# Patient Record
Sex: Female | Born: 1935 | Race: White | Hispanic: No | Smoking: Never smoker
Health system: Southern US, Community
[De-identification: ages and names within clinical notes are randomized; demographics above are authoritative.]

## PROBLEM LIST (undated history)

## (undated) DIAGNOSIS — I350 Nonrheumatic aortic (valve) stenosis: Secondary | ICD-10-CM

## (undated) DIAGNOSIS — D352 Benign neoplasm of pituitary gland: Secondary | ICD-10-CM

## (undated) DIAGNOSIS — J690 Pneumonitis due to inhalation of food and vomit: Secondary | ICD-10-CM

## (undated) DIAGNOSIS — I10 Essential (primary) hypertension: Secondary | ICD-10-CM

## (undated) DIAGNOSIS — R1310 Dysphagia, unspecified: Secondary | ICD-10-CM

## (undated) DIAGNOSIS — S329XXA Fracture of unspecified parts of lumbosacral spine and pelvis, initial encounter for closed fracture: Secondary | ICD-10-CM

## (undated) DIAGNOSIS — I712 Thoracic aortic aneurysm, without rupture, unspecified: Secondary | ICD-10-CM

## (undated) DIAGNOSIS — I96 Gangrene, not elsewhere classified: Secondary | ICD-10-CM

## (undated) DIAGNOSIS — B019 Varicella without complication: Secondary | ICD-10-CM

## (undated) DIAGNOSIS — E785 Hyperlipidemia, unspecified: Secondary | ICD-10-CM

## (undated) DIAGNOSIS — G4700 Insomnia, unspecified: Secondary | ICD-10-CM

## (undated) DIAGNOSIS — I639 Cerebral infarction, unspecified: Secondary | ICD-10-CM

## (undated) DIAGNOSIS — R011 Cardiac murmur, unspecified: Secondary | ICD-10-CM

## (undated) DIAGNOSIS — G8191 Hemiplegia, unspecified affecting right dominant side: Secondary | ICD-10-CM

## (undated) DIAGNOSIS — I872 Venous insufficiency (chronic) (peripheral): Secondary | ICD-10-CM

## (undated) DIAGNOSIS — M1990 Unspecified osteoarthritis, unspecified site: Secondary | ICD-10-CM

## (undated) HISTORY — DX: Gangrene, not elsewhere classified: I96

## (undated) HISTORY — PX: ABDOMINAL HYSTERECTOMY: SHX81

## (undated) HISTORY — PX: ABOVE KNEE LEG AMPUTATION: SUR20

## (undated) HISTORY — DX: Nonrheumatic aortic (valve) stenosis: I35.0

## (undated) HISTORY — DX: Varicella without complication: B01.9

---

## 2003-10-25 ENCOUNTER — Inpatient Hospital Stay (HOSPITAL_COMMUNITY): Admission: RE | Admit: 2003-10-25 | Discharge: 2003-10-26 | Payer: Self-pay | Admitting: *Deleted

## 2003-10-25 ENCOUNTER — Encounter (INDEPENDENT_AMBULATORY_CARE_PROVIDER_SITE_OTHER): Payer: Self-pay | Admitting: *Deleted

## 2006-08-29 ENCOUNTER — Encounter: Admission: RE | Admit: 2006-08-29 | Discharge: 2006-08-29 | Payer: Self-pay | Admitting: Sports Medicine

## 2006-09-12 ENCOUNTER — Emergency Department (HOSPITAL_COMMUNITY): Admission: EM | Admit: 2006-09-12 | Discharge: 2006-09-12 | Payer: Self-pay

## 2007-10-18 ENCOUNTER — Emergency Department (HOSPITAL_BASED_OUTPATIENT_CLINIC_OR_DEPARTMENT_OTHER): Admission: EM | Admit: 2007-10-18 | Discharge: 2007-10-18 | Payer: Self-pay | Admitting: Emergency Medicine

## 2010-06-15 NOTE — H&P (Signed)
NAME:  Christina Pittman, Christina Pittman                 ACCOUNT NO.:  [id number]   MEDICAL RECORD NO.:  [id number]          PATIENT TYPE:  INP   LOCATION:  NA                           FACILITY:  [facility]   PHYSICIAN:  Almedia Balls. Fore, M.D.   DATE OF BIRTH:  [birth year]   DATE OF ADMISSION:  10/25/2003  DATE OF DISCHARGE:                                HISTORY & PHYSICAL   CHIEF COMPLAINT:  Something falling.   HISTORY:  The patient is an 77 year old gravida 3, para 3, with last  menstrual period many years ago, who was seen in our office initially on  July 12, 2003, with total uterovaginal prolapse.  She was evaluated at that  time and felt that she would benefit from a pessary to replace the uterus  and vaginal tissue.  This was attempted, and the patient felt that this was  too uncomfortable.  Multiple sizes of pessaries were attempted, but the  patient declined to continue using them.  She was evaluated over the next  several months on approximately every two to three-week interval after  suggesting that she do Kegel exercises and use Crisco shortening as she did  not wish to use any hormone replacement.  This problem has not improved.  She is admitted at this time for hysterectomy, bilateral salpingo-  oophorectomy at her request, and repair of uterovaginal prolapse with  anterior and posterior colporrhaphy.  Pap smear recently has been normal.  The patient states she has never had an abnormal Pap smear.  She has been  fully counseled as to the nature of the surgery and the risks involved, to  include risks of anesthesia, injury to bowel, bladder, blood vessels,  ureters, postoperative hemorrhage, infection, recuperation, use of a  suprapubic catheter postoperatively.  She fully understands all these  considerations and wishes to proceed on October 25, 2003.   PAST MEDICAL HISTORY:  History of hypertension, for which she is followed by  doctors in the [facility].   FAMILY HISTORY:   Brother with heart disease and hypertension.   REVIEW OF SYSTEMS:  HEENT:  Negative.  CARDIORESPIRATORY:  Negative except  for hypertension.  GASTROINTESTINAL:  Negative.  GENITOURINARY:  As in  present illness.  NEUROMUSCULAR:  Negative.   PHYSICAL EXAMINATION:  VITAL SIGNS:  Height 5 feet 6 inches, weight 168  pounds, blood pressure 142/102, pulse 88, respirations 18.  GENERAL:  A well-developed white female in no acute distress but anxious.  HEENT:  Within normal limits.  NECK:  Supple without mass, adenopathy, or bruits.  CARDIAC:  Regular rate and rhythm without murmurs.  CHEST:  Lungs clear to P&A.  BREASTS:  Sitting and lying without mass, axillae negative.  ABDOMEN:  Flat and soft without mass or tenderness.  PELVIC:  External genitalia, Bartholin's, urethra, and Skene's glands within  normal limits.  There is a complete uterovaginal prolapse present with  cervix extending out 4-5 cm beyond the introitus at rest.  There are no  palpable adnexal masses.  Rectovaginal exam is confirmatory.  EXTREMITIES:  Within normal limits.  CENTRAL NERVOUS SYSTEM:  Grossly intact.  SKIN:  Without suspicious lesions.   IMPRESSION:  Uterovaginal prolapse, complete.   DISPOSITION:  Surgery as noted above.      SRF/MEDQ  D:  10/20/2003  T:  10/20/2003  Job:  [id number]

## 2010-06-15 NOTE — Discharge Summary (Signed)
NAME:  Christina Pittman, Christina Pittman NO.:  [id number]   MEDICAL RECORD NO.:  [id number]          PATIENT TYPE:  INP   LOCATION:  0469                         FACILITY:  [facility]   PHYSICIAN:  Almedia Balls. Fore, M.D.   DATE OF BIRTH:  08-22-1919   DATE OF ADMISSION:  10/25/2003  DATE OF DISCHARGE:  10/26/2003                                 DISCHARGE SUMMARY   HISTORY:  The patient is an 77 year old with complete uterovaginal prolapse  for hysterectomy and repairs on October 25, 2003. The remainder of her  history and physical are as previously dictated.   LABORATORY DATA:  Laboratory data includes preoperative hemoglobin 13.7.  Other chemistries include creatinine 0.9, BUN 11, and glucose elevated at  194. Chest x-ray was without acute process but with accentuated  bronchovascular markings. Electrocardiogram showed anterior infarct of  undetermined age but otherwise normal.   HOSPITAL COURSE:  The patient was taken to the operating room on October 25, 2003 at which time laparoscopy, vaginal hysterectomy, anterior and  posterior colporrhaphy, and colpocleisis were performed. The patient did  well postoperatively. Diet and ambulation were progressed over the evening  of September 27 and early morning of September 28. On the morning of  September 28, she was experiencing pain which was relieved with oral  analgesics, and the suprapubic catheter was functioning normally. She is to  be instructed in the use of the suprapubic catheter and the way to clamp and  release for her own voiding. It was felt that she could be discharged after  this was accomplished.   FINAL DIAGNOSES:  1.  Complete uterovaginal prolapse.  2.  Pelvic pain secondary to the above.   OPERATION:  Laparoscopy, vaginal hysterectomy, anterior and posterior  colporrhaphy, colpocleisis. Pathology report unavailable at the time of  dictation.   DISPOSITION:  Discharged home to return to the office in 2 weeks for  follow  up or when she is able to void and have measured residuals less than 60 mL  twice in a 12-hour period. She was instructed to gradually progress her  activities over several weeks at home and to limit lifting and driving for 2  weeks. She was fully ambulatory, on a regular diet, and in good condition at  the time of discharge. She was given prescriptions for Darvocet-N 100  generic #30 to be taken 1/2 to 2 q.6h. p.r.n. pain, doxycycline 100 mg #12  to be taken 1 b.i.d., and Urecholine generic 25 mg #28 one q.i.d.      SRF/MEDQ  D:  10/26/2003  T:  10/26/2003  Job:  [id number]

## 2010-06-15 NOTE — Op Note (Signed)
NAME:  Christina Pittman, Christina Pittman NO.:  [id number]   MEDICAL RECORD NO.:  [id number]          PATIENT TYPE:  INP   LOCATION:  0469                         FACILITY:  [facility]   PHYSICIAN:  Almedia Balls. Fore, M.D.   DATE OF BIRTH:  1919-11-30   DATE OF PROCEDURE:  10/25/2003  DATE OF DISCHARGE:                                 OPERATIVE REPORT   PREOPERATIVE DIAGNOSIS:  Complete uterovaginal prolapse.   POSTOPERATIVE DIAGNOSIS:  Complete uterovaginal prolapse, pending pathology.   OPERATION:  Laparoscopy, vaginal hysterectomy, anterior and posterior  colporrhaphy, colpocleisis.   ANESTHESIA:  General orotracheal.   OPERATOR:  Almedia Balls. Randell Patient, M.D.   FIRST ASSISTANT:  Leona Singleton, M.D.   INDICATIONS FOR SURGERY:  Patient is an 77 year old with the above-noted  problems who has been tried on various other temporizing methods to reduce  the uterovaginal prolapse without success.  She has been counseled as to the  need for surgery and the type of surgery to be performed as well as the  risks involved, to include risk of anesthesia, injury to bowel, bladder,  blood vessels, ureters, postoperative hemorrhage, infection, recuperation,  use of a suprapubic catheter postoperatively and total closure of the  vaginal area.  She fully understands all of these considerations and wishes  to proceed on October 25, 2003.   OPERATIVE FINDINGS:  On examination under anesthesia, it was noted that the  uterus and upper vagina were totally prolapsed through the introitus.  There  was a severe cystocele and rectocele present as well.  On laparoscopy, the  lower liver edge area of the gallbladder, spleen, and area of the appendix  were normal on visualization.  The ovaries were quite atrophic and just  simply streaks in the area of the adnexal structures.   PROCEDURE:  With the patient under general anesthesia, prepped and draped in  the usual sterile fashion, and with SCD stockings on, the  patient was  examined in the dorsal lithotomy position.  A Foley catheter was placed in  the bladder with 400 ml of normal saline being instilled.  Placement of a  Bonnano suprapubic catheter was accomplished without difficulty.  This was  left to straight drainage.  The patient was then prepared and draped for a  laparoscopy procedure.  An incision was made in the lower pole of the  umbilicus with insertion of the Veress cannula and insufflation of 3 liters  of carbon dioxide, a reusable 10 mm trocar for the operative scope, and the  scope itself was inserted into the peritoneal cavity.  A reusable 5 mm probe  was inserted through a stab wound above the insertion of the suprapubic  catheter, well above the reflection of the bladder.  The above-noted  findings were visualized.   The patient was then repositioned for a vaginal procedure.  A weighted  speculum was placed in the posterior vagina.  The cervix was grasped with  two Christella Hartigan' tenacula.  With only a minimal amount of effort, the uterus  prolapsed totally through the introitus.  Then injection with 1% lidocaine  with  1:200,000 epinephrine was carried out circumferentially on the cervix  with hemostasis and delineation of fascial planes.  A total of 7 ml was used  in this area.  The cervix was then circumcised with sharp dissection with  bladder in the posterior vaginal mucosa being dissected free.  Entry into  the posterior cul de sac was accomplished without difficulty.  Underlying  bladder fascia was dissected off of the cervix and lower uterine segment.  Haney clamps were used to clamp in succession the uterosacral ligaments  bilaterally and vaginal cuff angles bilaterally, cardinal ligaments  bilaterally, uterine vessels bilaterally, and broad ligaments and upper  pedicles bilaterally.  These structures were sequentially cut free and  individually suture-ligated with 1 chromic cat gut, the upper pedicles being  doubly ligated.   The uterus and cervix were excised as a single specimen.  The area was inspected for hemostasis.  Hemostasis was maintained in the  operative site except for vaginal mucosal bleeding, and the sponge and  instrument counts were correct.  The peritoneum was closed with a purse-  string suture of 1 chromic cat gut.  The vaginal mucosa was reapproximated  and rendered hemostatic with a continuous interlocking suture of 1 chromic  cat gut.  Attention was then directed to the anterior colporrhaphy.   An incision was made in the upper vaginal mucosa after an injection of  approximately 3 ml of the lidocaine-epinephrine solution.  An incision was  made in the midline underlying the urethra, and the underlying vaginal  fascia was dissected free of the bladder.  This was carried up to the  urethra, which was prolapsed as well.  Kocher clamps were placed on the  posterior pubic fascia, and interrupted mattress sutures of 2-0 Vicryl were  placed for elevation of the urethra.  This was carried out in an imbricating  fashion with good elevation of the urethra.  The cystocele was reduced using  interrupted imbricating purse-string sutures of 0 Vicryl.  Redundant vaginal  mucosa was then trimmed, and the vaginal mucosal edges were rendered  hemostatic with a continuous interlocking suture of 1 chromic cat gut with  the central area being left open.   Attention was directed to the posterior colporrhaphy.  Injection of 1%  lidocaine with 1:200,000 epinephrine was injected into the midline and  underlying posterior vaginal mucosa.  An incision was made in the midline,  and the lateral aspects of the vaginal mucosa were dissected free of the  underlying levator ani fascia.  This tissue was then reapproximated with  interrupted sutures of 0 chromic cat gut. Redundant vaginal mucosa was then excised, and the edges of the open vaginal mucosal defect were rendered  hemostatic with continuous interlocking suture  of 2-0 chromic cat gut.  The  anterior and posterior vaginal fascial areas were then approximated with  interrupted sutures of 2-0 PDS, effectively closing off the vaginal space.  The remaining vaginal mucosa was also reapproximated with an interrupted  mattress suture of 2-0 chromic cat gut.   The patient was then reinsufflated with 3 liters of carbon dioxide for  repeat laparoscopy.  This was accomplished after copious lavage with normal  saline solution.  The area of the hysterectomy was completely hemostatic.  After noting that this was the case and that sponge and instrument counts  were correct, the incisions were closed with fascial sutures of 2-0 Vicryl  and subcuticular sutures of 3-0 plain cat gut.  Estimated blood loss 150 ml.  The  patient was taken to the recovery room in good condition with good  function from the suprapubic catheter and clear urine in the tubing.  She  will be admitted following surgery.      SRF/MEDQ  D:  10/25/2003  T:  10/25/2003  Job:  [id number]   cc:   Leona Singleton, M.D.  [address]  [place]  [place] [address]  Fax: [phone]

## 2010-06-23 ENCOUNTER — Emergency Department (INDEPENDENT_AMBULATORY_CARE_PROVIDER_SITE_OTHER): Payer: Medicare Other

## 2010-06-23 ENCOUNTER — Emergency Department (HOSPITAL_BASED_OUTPATIENT_CLINIC_OR_DEPARTMENT_OTHER)
Admission: EM | Admit: 2010-06-23 | Discharge: 2010-06-23 | Disposition: A | Discharge status: Home / Self Care | Payer: Medicare Other | Attending: Emergency Medicine | Admitting: Emergency Medicine

## 2010-06-23 DIAGNOSIS — F29 Unspecified psychosis not due to a substance or known physiological condition: Secondary | ICD-10-CM | POA: Insufficient documentation

## 2010-06-23 DIAGNOSIS — R4182 Altered mental status, unspecified: Secondary | ICD-10-CM

## 2010-06-23 DIAGNOSIS — I10 Essential (primary) hypertension: Secondary | ICD-10-CM

## 2010-06-23 DIAGNOSIS — E119 Type 2 diabetes mellitus without complications: Secondary | ICD-10-CM | POA: Insufficient documentation

## 2010-06-23 DIAGNOSIS — N390 Urinary tract infection, site not specified: Secondary | ICD-10-CM | POA: Insufficient documentation

## 2010-06-23 LAB — DIFFERENTIAL
Basophils Relative: 1 % (ref 0–1)
Eosinophils Relative: 3 % (ref 0–5)
Lymphocytes Relative: 20 % (ref 12–46)
Monocytes Relative: 11 % (ref 3–12)
Neutrophils Relative %: 66 % (ref 43–77)

## 2010-06-23 LAB — URINE MICROSCOPIC-ADD ON

## 2010-06-23 LAB — CBC
HCT: 36.5 % (ref 36.0–46.0)
Hemoglobin: 12.3 g/dL (ref 12.0–15.0)
MCV: 89 fL (ref 78.0–100.0)
Platelets: 213 10*3/uL (ref 150–400)
RBC: 4.1 MIL/uL (ref 3.87–5.11)

## 2010-06-23 LAB — URINALYSIS, ROUTINE W REFLEX MICROSCOPIC
Glucose, UA: NEGATIVE mg/dL
Nitrite: NEGATIVE

## 2010-06-23 LAB — BASIC METABOLIC PANEL
BUN: 17 mg/dL (ref 6–23)
CO2: 25 mEq/L (ref 19–32)
Creatinine, Ser: 1 mg/dL (ref 0.4–1.2)
GFR calc Af Amer: >60 mL/min (ref >60)
Potassium: 4.5 mEq/L (ref 3.5–5.1)
Sodium: 137 mEq/L (ref 135–145)

## 2010-06-25 LAB — URINE CULTURE

## 2010-06-29 ENCOUNTER — Emergency Department (INDEPENDENT_AMBULATORY_CARE_PROVIDER_SITE_OTHER): Payer: Medicare Other

## 2010-06-29 ENCOUNTER — Emergency Department (HOSPITAL_BASED_OUTPATIENT_CLINIC_OR_DEPARTMENT_OTHER)
Admission: EM | Admit: 2010-06-29 | Discharge: 2010-06-29 | Disposition: A | Discharge status: Home / Self Care | Payer: Medicare Other | Attending: Emergency Medicine | Admitting: Emergency Medicine

## 2010-06-29 DIAGNOSIS — G319 Degenerative disease of nervous system, unspecified: Secondary | ICD-10-CM | POA: Insufficient documentation

## 2010-06-29 DIAGNOSIS — Z79899 Other long term (current) drug therapy: Secondary | ICD-10-CM | POA: Insufficient documentation

## 2010-06-29 DIAGNOSIS — R5383 Other fatigue: Secondary | ICD-10-CM

## 2010-06-29 DIAGNOSIS — F29 Unspecified psychosis not due to a substance or known physiological condition: Secondary | ICD-10-CM

## 2010-06-29 DIAGNOSIS — R5381 Other malaise: Secondary | ICD-10-CM | POA: Insufficient documentation

## 2010-06-29 DIAGNOSIS — N390 Urinary tract infection, site not specified: Secondary | ICD-10-CM | POA: Insufficient documentation

## 2010-06-29 DIAGNOSIS — Z8673 Personal history of transient ischemic attack (TIA), and cerebral infarction without residual deficits: Secondary | ICD-10-CM

## 2010-06-29 LAB — URINE MICROSCOPIC-ADD ON

## 2010-06-29 LAB — URINALYSIS, ROUTINE W REFLEX MICROSCOPIC
Bilirubin Urine: NEGATIVE
Glucose, UA: 250 mg/dL — AB
Hgb urine dipstick: NEGATIVE
Ketones, ur: NEGATIVE mg/dL
Urobilinogen, UA: 0.2 mg/dL (ref 0.0–1.0)

## 2010-07-01 LAB — URINE CULTURE: Colony Count: NO GROWTH

## 2010-07-02 LAB — URINE MICROSCOPIC-ADD ON

## 2010-07-02 LAB — CK TOTAL AND CKMB (NOT AT ARMC)
CK, MB: 3.1 ng/mL (ref 0.3–4.0)
Relative Index: INVALID (ref 0.0–2.5)

## 2010-07-02 LAB — BASIC METABOLIC PANEL
CO2: 24 mEq/L (ref 19–32)
Calcium: 9.5 mg/dL (ref 8.4–10.5)
Chloride: 104 mEq/L (ref 96–112)
Creatinine, Ser: 1 mg/dL (ref 0.4–1.2)
GFR calc Af Amer: >60 mL/min (ref >60)
GFR calc non Af Amer: 52 mL/min — ABNORMAL LOW (ref >60)
Sodium: 140 mEq/L (ref 135–145)

## 2010-07-02 LAB — DIFFERENTIAL
Basophils Absolute: 0.1 10*3/uL (ref 0.0–0.1)
Eosinophils Absolute: 0.3 10*3/uL (ref 0.0–0.7)
Lymphocytes Relative: 22 % (ref 12–46)
Lymphs Abs: 1.9 10*3/uL (ref 0.7–4.0)

## 2010-07-02 LAB — TROPONIN I: Troponin I: <0.3 ng/mL (ref <0.30)

## 2010-07-02 LAB — CBC
MCHC: 33.6 g/dL (ref 30.0–36.0)
MCV: 89.4 fL (ref 78.0–100.0)

## 2010-07-03 ENCOUNTER — Emergency Department (HOSPITAL_COMMUNITY)
Admission: EM | Admit: 2010-07-03 | Discharge: 2010-07-03 | Disposition: A | Discharge status: Home / Self Care | Payer: Medicare Other | Attending: Emergency Medicine | Admitting: Emergency Medicine

## 2010-07-03 DIAGNOSIS — I10 Essential (primary) hypertension: Secondary | ICD-10-CM | POA: Insufficient documentation

## 2010-07-03 DIAGNOSIS — Z049 Encounter for examination and observation for unspecified reason: Secondary | ICD-10-CM | POA: Insufficient documentation

## 2010-07-03 DIAGNOSIS — R404 Transient alteration of awareness: Secondary | ICD-10-CM | POA: Insufficient documentation

## 2010-07-03 DIAGNOSIS — E119 Type 2 diabetes mellitus without complications: Secondary | ICD-10-CM | POA: Insufficient documentation

## 2010-10-28 ENCOUNTER — Emergency Department (INDEPENDENT_AMBULATORY_CARE_PROVIDER_SITE_OTHER): Payer: Medicare Other

## 2010-10-28 ENCOUNTER — Emergency Department (HOSPITAL_BASED_OUTPATIENT_CLINIC_OR_DEPARTMENT_OTHER)
Admission: EM | Admit: 2010-10-28 | Discharge: 2010-10-28 | Disposition: A | Discharge status: Home / Self Care | Payer: Medicare Other | Attending: Emergency Medicine | Admitting: Emergency Medicine

## 2010-10-28 ENCOUNTER — Other Ambulatory Visit: Payer: Self-pay

## 2010-10-28 ENCOUNTER — Encounter: Payer: Self-pay | Admitting: *Deleted

## 2010-10-28 DIAGNOSIS — W19XXXA Unspecified fall, initial encounter: Secondary | ICD-10-CM | POA: Insufficient documentation

## 2010-10-28 DIAGNOSIS — M25569 Pain in unspecified knee: Secondary | ICD-10-CM

## 2010-10-28 DIAGNOSIS — M79609 Pain in unspecified limb: Secondary | ICD-10-CM

## 2010-10-28 DIAGNOSIS — S8000XA Contusion of unspecified knee, initial encounter: Secondary | ICD-10-CM | POA: Insufficient documentation

## 2010-10-28 DIAGNOSIS — S8002XA Contusion of left knee, initial encounter: Secondary | ICD-10-CM

## 2010-10-28 DIAGNOSIS — E119 Type 2 diabetes mellitus without complications: Secondary | ICD-10-CM | POA: Insufficient documentation

## 2010-10-28 DIAGNOSIS — E860 Dehydration: Secondary | ICD-10-CM

## 2010-10-28 DIAGNOSIS — Y92009 Unspecified place in unspecified non-institutional (private) residence as the place of occurrence of the external cause: Secondary | ICD-10-CM | POA: Insufficient documentation

## 2010-10-28 HISTORY — DX: Unspecified osteoarthritis, unspecified site: M19.90

## 2010-10-28 HISTORY — DX: Cardiac murmur, unspecified: R01.1

## 2010-10-28 HISTORY — DX: Essential (primary) hypertension: I10

## 2010-10-28 LAB — BASIC METABOLIC PANEL
BUN: 16 mg/dL (ref 6–23)
CO2: 24 mEq/L (ref 19–32)
Calcium: 9.2 mg/dL (ref 8.4–10.5)
Chloride: 99 mEq/L (ref 96–112)
Creatinine, Ser: 0.8 mg/dL (ref 0.50–1.10)

## 2010-10-28 LAB — CBC
HCT: 36.4 % (ref 36.0–46.0)
MCH: 31 pg (ref 26.0–34.0)
MCV: 90.3 fL (ref 78.0–100.0)
RBC: 4.03 MIL/uL (ref 3.87–5.11)
WBC: 8.2 10*3/uL (ref 4.0–10.5)

## 2010-10-28 MED ORDER — TETANUS-DIPHTHERIA TOXOIDS TD 5-2 LFU IM INJ
0.5000 mL | INJECTION | Freq: Once | INTRAMUSCULAR | Status: DC
  Filled 2010-10-28: qty 0.5

## 2010-10-28 MED ORDER — FENTANYL CITRATE 0.05 MG/ML IJ SOLN
50.0000 ug | Freq: Once | INTRAMUSCULAR | Status: AC
  Administered 2010-10-28: 50 ug via INTRAVENOUS
  Filled 2010-10-28: qty 2

## 2010-10-28 MED ORDER — HYDROCODONE-ACETAMINOPHEN 5-325 MG PO TABS: 1.0000 | ORAL_TABLET | ORAL | Status: AC | PRN

## 2010-10-28 MED ORDER — HYDROCODONE-ACETAMINOPHEN 5-325 MG PO TABS
1.0000 | ORAL_TABLET | Freq: Once | ORAL | Status: AC
  Administered 2010-10-28: 1 via ORAL
  Filled 2010-10-28: qty 1

## 2010-10-28 MED ORDER — SODIUM CHLORIDE 0.9 % IV BOLUS (SEPSIS)
1000.0000 mL | Freq: Once | INTRAVENOUS | Status: AC
  Administered 2010-10-28: 1000 mL via INTRAVENOUS

## 2010-10-28 NOTE — ED Notes (Signed)
Pt states she "got dizzy and fell" while in bathroom- swelling noted to left leg- c/o pain in left knee- states "i tried everything to get up but couldn't- my legs are weak"

## 2010-10-28 NOTE — ED Provider Notes (Signed)
History  Scribed for Dr. Golda Acre, the patient was seen in room MH02. The chart was scribed by Gilman Schmidt. The patients care was started at 1803.  CSN: [id number] Arrival date & time: 10/28/2010  5:50 PM  Chief Complaint  Patient presents with  . Fall  . Leg Pain    HPI Christina Pittman is a 77 y.o. female who presents to the Emergency Department complaining of left leg and knee pain from fall. Pt reports ambulating to bathroom and losing her balance, causing her to fall ~2 hours ago. States that she was unable to get up. Additionally notes dizziness a. Denies any syncope, chest pain, or nausea. There are no other associated symptoms and no other alleviating or aggravating factors.   HPI ELEMENTS: Location: left leg Onset: ~2 hours ago Duration: persistent with onset  Timing: constant   Context:  as above  Associated symptoms: dizziness and left  PAST MEDICAL HISTORY:  Past Medical History  Diagnosis Date  . Arthritis   . Diabetes mellitus   . Hypertension   . Heart murmur      PAST SURGICAL HISTORY:  Past Surgical History  Procedure Date  . Abdominal hysterectomy      MEDICATIONS:  Previous Medications   No medications on file     ALLERGIES:  Allergies as of 10/28/2010 - Review Complete 10/28/2010  Allergen Reaction Noted  . Betadine (povidone iodine) Itching 10/28/2010  . Neosporin (neomycin-polymyx-gramicid) Hives 10/28/2010     FAMILY HISTORY:  No family history on file.   SOCIAL HISTORY: History  Substance Use Topics  . Smoking status: Never Smoker   . Smokeless tobacco: Not on file  . Alcohol Use: No     Review of Systems  Cardiovascular: Negative for chest pain.  Gastrointestinal: Negative for nausea.  Musculoskeletal:       Leg pain   Neurological: Positive for dizziness. Negative for syncope.       Loss of balance  All other systems reviewed and are negative.    Allergies  Review of patient's allergies indicates not on file.  Home Medications   No current outpatient prescriptions on file.  BP 188/83  Pulse 92  Temp(Src) 99.1 F (37.3 C) (Oral)  Resp 20  Ht 5\' 4"  (1.626 m)  Wt 141 lb (63.957 kg)  BMI 24.20 kg/m2  SpO2 99%  Physical Exam  Constitutional: She is oriented to person, place, and time. She appears well-developed and well-nourished.  Non-toxic appearance. She does not have a sickly appearance.  HENT:  Head: Normocephalic and atraumatic.  Eyes: Conjunctivae, EOM and lids are normal. Pupils are equal, round, and reactive to light. No scleral icterus.  Neck: Trachea normal and normal range of motion. Neck supple.  Cardiovascular: Regular rhythm.   Murmur heard.  Systolic (blowing ) murmur is present  Pulmonary/Chest: Effort normal and breath sounds normal.  Abdominal: Soft. Normal appearance. There is no tenderness. There is no rebound, no guarding and no CVA tenderness.  Musculoskeletal:       1+ pitting edema bilaterally Top of left foot erythema/scabs No induration  No crepitus  Proximal tib/fib tenderness No spinal tenderness  Neurological: She is alert and oriented to person, place, and time. She has normal strength.       Palpable bilateral pulses   Skin: Skin is warm, dry and intact. No rash noted.       2cm skin tear on right anterior forearm    ED Course  Procedures  OTHER DATA REVIEWED: Nursing  notes, vital signs, and past medical records reviewed.  DIAGNOSTIC STUDIES: Oxygen Saturation is 99% on room air, normal by my interpretation.      Date: 10/28/2010  Rate: 111  Rhythm: sinus tachycardia  QRS Axis: normal  Intervals: normal  ST/T Wave abnormalities: Biphasic T waves present in inferior and lateral leads  Conduction Disutrbances: none  Narrative Interpretation: Old anterior septal infarct present  Old EKG Reviewed: unchanged from 06/29/2010    LABS: Results for orders placed during the hospital encounter of 10/28/10  CBC      Component Value Range   WBC 8.2  4.0 - 10.5  (K/uL)   RBC 4.03  3.87 - 5.11 (MIL/uL)   Hemoglobin 12.5  12.0 - 15.0 (g/dL)   HCT 45.4  09.8 - 11.9 (%)   MCV 90.3  78.0 - 100.0 (fL)   MCH 31.0  26.0 - 34.0 (pg)   MCHC 34.3  30.0 - 36.0 (g/dL)   RDW 14.7  82.9 - 56.2 (%)   Platelets 187  150 - 400 (K/uL)  BASIC METABOLIC PANEL      Component Value Range   Sodium 134 (*) 135 - 145 (mEq/L)   Potassium 4.3  3.5 - 5.1 (mEq/L)   Chloride 99  96 - 112 (mEq/L)   CO2 24  19 - 32 (mEq/L)   Glucose, Bld 166 (*) 70 - 99 (mg/dL)   BUN 16  6 - 23 (mg/dL)   Creatinine, Ser 1.30  0.50 - 1.10 (mg/dL)   Calcium 9.2  8.4 - 86.5 (mg/dL)   GFR calc non Af Amer >60  >60 (mL/min)   GFR calc Af Amer >60  >60 (mL/min)     RADIOLOGY:  DG Tib/Fib 2 View. Reviewed by me.  IMPRESSION: No acute bony findings. Original Report Authenticated By: P. Loralie Champagne, M.D  DG Knee 1-2 Views. Reviewed by me.  IMPRESSION: Degenerative changes but no acute fracture or joint effusion. Original Report Authenticated By: P. Loralie Champagne, M.D.   ED COURSE / COORDINATION OF CARE: 1803:  - Patient evaluated by ED physician, DG Knee, DG Tib/Fib, labs, EKG ordered  MDM: Patient presented with a fall that did not involve loss of consciousness. She describes that she follow with you before she fell but that she was not truly feel like she was going to pass out. She is unclear she actually may have tripped over something in her bathroom. Patient has no chest pain or shortness of breath. Her EKG did show some sinus tachycardia and orthostatic vital signs wall her blood pressure stayed the same she did become more tachycardic with standing. This gives some evidence to show that she is somewhat dehydrated and so she was given 1 L of normal saline for this. I evaluated the patient's knee and tib-fib for possible fracture given the swelling in the patient's pain. Apparently she was able to walk more normally immediately after this happened and her ability to ambulate has  become worsened as the knee has become more swollen. I did discuss with her the possibility of performing CT for further evaluation for tibial plateau fracture but she does not want to complete this at this time. She did show [place] that she is able to bear weight and ambulate on her own care in the emergency department. This makes it less likely that she has a fracture. I did advise that the patient have someone with her over the next few days to his sister and family is at bedside  and states they will be available to be with her and her residence her she can stay with them. I've also advised that she followup with orthopedic surgery later this week for reevaluation of her knee injury. She'll go home with pain medicine and has been advised that it can cause constipation as well. She's also been advised that using a walker may be helpful over the next few days the family states they do have access to a walker already. Patient is to be discharged home with her followup as noted in family is okay with this plan.  IMPRESSION: Diagnoses that have been ruled out:  Diagnoses that are still under consideration:  Final diagnoses:    PLAN:  Home. The patient is to return the emergency department if there is any worsening of symptoms. I have reviewed the discharge instructions with the patient and family.  CONDITION ON DISCHARGE: Stable  MEDICATIONS GIVEN IN THE E.D.  Medications  tetanus & diphtheria toxoids (adult) [facility]) injection 0.5 mL (not administered)  fentaNYL (SUBLIMAZE) 0.05 MG/ML injection 50 mcg (not administered)  HYDROcodone-acetaminophen (NORCO) 5-325 MG per tablet 1 tablet (not administered)    DISCHARGE MEDICATIONS: New Prescriptions   No medications on file    SCRIBE ATTESTATION: I personally performed the services described in this documentation, which was scribed in my presence. The recorded information has been reviewed and considered.           Nat Christen,  MD 10/28/10 2113

## 2010-10-28 NOTE — ED Notes (Signed)
Patient ambulates with assistance. She states it is very painful for her to walk. Patient appears uncomfortable at first, but begins to tolerate ambulation well.

## 2010-10-28 NOTE — ED Notes (Signed)
Md at bedside. Patient and family on board with plan of care. Patient getting dressed and IV discontinued. Patient and family await discharge

## 2010-10-29 LAB — CBC
MCHC: 33.8
MCV: 90.7
Platelets: 297
RBC: 4.01
RDW: 12.5

## 2010-10-29 LAB — BASIC METABOLIC PANEL
BUN: 18
CO2: 27
Chloride: 103
Creatinine, Ser: 1
Glucose, Bld: 176 — ABNORMAL HIGH

## 2010-10-29 LAB — DIFFERENTIAL
Basophils Absolute: 0.1
Basophils Relative: 1
Eosinophils Absolute: 0.6
Monocytes Absolute: 0.9
Neutrophils Relative %: 68

## 2011-01-31 DIAGNOSIS — S90129A Contusion of unspecified lesser toe(s) without damage to nail, initial encounter: Secondary | ICD-10-CM | POA: Diagnosis not present

## 2011-01-31 DIAGNOSIS — E1149 Type 2 diabetes mellitus with other diabetic neurological complication: Secondary | ICD-10-CM | POA: Diagnosis not present

## 2011-02-04 DIAGNOSIS — L97509 Non-pressure chronic ulcer of other part of unspecified foot with unspecified severity: Secondary | ICD-10-CM | POA: Diagnosis not present

## 2011-02-04 DIAGNOSIS — I739 Peripheral vascular disease, unspecified: Secondary | ICD-10-CM | POA: Diagnosis not present

## 2011-02-04 DIAGNOSIS — L98499 Non-pressure chronic ulcer of skin of other sites with unspecified severity: Secondary | ICD-10-CM | POA: Diagnosis not present

## 2011-02-06 DIAGNOSIS — I70219 Atherosclerosis of native arteries of extremities with intermittent claudication, unspecified extremity: Secondary | ICD-10-CM | POA: Diagnosis not present

## 2011-02-06 DIAGNOSIS — R0989 Other specified symptoms and signs involving the circulatory and respiratory systems: Secondary | ICD-10-CM | POA: Diagnosis not present

## 2011-02-11 DIAGNOSIS — L97509 Non-pressure chronic ulcer of other part of unspecified foot with unspecified severity: Secondary | ICD-10-CM | POA: Diagnosis not present

## 2011-02-18 DIAGNOSIS — I739 Peripheral vascular disease, unspecified: Secondary | ICD-10-CM | POA: Diagnosis not present

## 2011-02-18 DIAGNOSIS — L97509 Non-pressure chronic ulcer of other part of unspecified foot with unspecified severity: Secondary | ICD-10-CM | POA: Diagnosis not present

## 2011-02-18 DIAGNOSIS — L98499 Non-pressure chronic ulcer of skin of other sites with unspecified severity: Secondary | ICD-10-CM | POA: Diagnosis not present

## 2011-02-25 DIAGNOSIS — I739 Peripheral vascular disease, unspecified: Secondary | ICD-10-CM | POA: Diagnosis not present

## 2011-02-25 DIAGNOSIS — E119 Type 2 diabetes mellitus without complications: Secondary | ICD-10-CM | POA: Diagnosis not present

## 2011-02-25 DIAGNOSIS — I10 Essential (primary) hypertension: Secondary | ICD-10-CM | POA: Diagnosis not present

## 2011-03-07 DIAGNOSIS — L97509 Non-pressure chronic ulcer of other part of unspecified foot with unspecified severity: Secondary | ICD-10-CM | POA: Diagnosis not present

## 2011-03-13 ENCOUNTER — Inpatient Hospital Stay (HOSPITAL_COMMUNITY): Payer: Medicare Other

## 2011-03-13 ENCOUNTER — Emergency Department (INDEPENDENT_AMBULATORY_CARE_PROVIDER_SITE_OTHER): Payer: Medicare Other

## 2011-03-13 ENCOUNTER — Other Ambulatory Visit: Payer: Self-pay

## 2011-03-13 ENCOUNTER — Encounter (HOSPITAL_BASED_OUTPATIENT_CLINIC_OR_DEPARTMENT_OTHER): Payer: Self-pay | Admitting: *Deleted

## 2011-03-13 ENCOUNTER — Inpatient Hospital Stay (HOSPITAL_BASED_OUTPATIENT_CLINIC_OR_DEPARTMENT_OTHER)
Admission: AD | Admit: 2011-03-13 | Discharge: 2011-03-15 | DRG: 065 | Disposition: A | Discharge status: Rehabilitation Facility | Payer: Medicare Other | Attending: Internal Medicine | Admitting: Internal Medicine

## 2011-03-13 DIAGNOSIS — R4789 Other speech disturbances: Secondary | ICD-10-CM | POA: Diagnosis not present

## 2011-03-13 DIAGNOSIS — I359 Nonrheumatic aortic valve disorder, unspecified: Secondary | ICD-10-CM | POA: Diagnosis not present

## 2011-03-13 DIAGNOSIS — R5381 Other malaise: Secondary | ICD-10-CM | POA: Diagnosis not present

## 2011-03-13 DIAGNOSIS — Z131 Encounter for screening for diabetes mellitus: Secondary | ICD-10-CM

## 2011-03-13 DIAGNOSIS — I10 Essential (primary) hypertension: Secondary | ICD-10-CM

## 2011-03-13 DIAGNOSIS — I6350 Cerebral infarction due to unspecified occlusion or stenosis of unspecified cerebral artery: Secondary | ICD-10-CM | POA: Diagnosis not present

## 2011-03-13 DIAGNOSIS — G4700 Insomnia, unspecified: Secondary | ICD-10-CM | POA: Diagnosis present

## 2011-03-13 DIAGNOSIS — G8110 Spastic hemiplegia affecting unspecified side: Secondary | ICD-10-CM | POA: Diagnosis not present

## 2011-03-13 DIAGNOSIS — R4701 Aphasia: Secondary | ICD-10-CM | POA: Diagnosis present

## 2011-03-13 DIAGNOSIS — E785 Hyperlipidemia, unspecified: Secondary | ICD-10-CM | POA: Diagnosis present

## 2011-03-13 DIAGNOSIS — Z7982 Long term (current) use of aspirin: Secondary | ICD-10-CM | POA: Diagnosis not present

## 2011-03-13 DIAGNOSIS — Z888 Allergy status to other drugs, medicaments and biological substances status: Secondary | ICD-10-CM

## 2011-03-13 DIAGNOSIS — G319 Degenerative disease of nervous system, unspecified: Secondary | ICD-10-CM | POA: Diagnosis not present

## 2011-03-13 DIAGNOSIS — I440 Atrioventricular block, first degree: Secondary | ICD-10-CM | POA: Diagnosis present

## 2011-03-13 DIAGNOSIS — D353 Benign neoplasm of craniopharyngeal duct: Secondary | ICD-10-CM | POA: Diagnosis present

## 2011-03-13 DIAGNOSIS — Z79899 Other long term (current) drug therapy: Secondary | ICD-10-CM

## 2011-03-13 DIAGNOSIS — R471 Dysarthria and anarthria: Secondary | ICD-10-CM | POA: Diagnosis not present

## 2011-03-13 DIAGNOSIS — G8191 Hemiplegia, unspecified affecting right dominant side: Secondary | ICD-10-CM | POA: Diagnosis present

## 2011-03-13 DIAGNOSIS — Z7902 Long term (current) use of antithrombotics/antiplatelets: Secondary | ICD-10-CM

## 2011-03-13 DIAGNOSIS — R1310 Dysphagia, unspecified: Secondary | ICD-10-CM | POA: Diagnosis present

## 2011-03-13 DIAGNOSIS — I517 Cardiomegaly: Secondary | ICD-10-CM | POA: Diagnosis present

## 2011-03-13 DIAGNOSIS — E119 Type 2 diabetes mellitus without complications: Secondary | ICD-10-CM | POA: Diagnosis not present

## 2011-03-13 DIAGNOSIS — G8190 Hemiplegia, unspecified affecting unspecified side: Secondary | ICD-10-CM | POA: Diagnosis present

## 2011-03-13 DIAGNOSIS — R29898 Other symptoms and signs involving the musculoskeletal system: Secondary | ICD-10-CM | POA: Diagnosis not present

## 2011-03-13 DIAGNOSIS — Z5189 Encounter for other specified aftercare: Secondary | ICD-10-CM | POA: Diagnosis not present

## 2011-03-13 DIAGNOSIS — I739 Peripheral vascular disease, unspecified: Secondary | ICD-10-CM | POA: Diagnosis not present

## 2011-03-13 DIAGNOSIS — R000 Tachycardia, unspecified: Secondary | ICD-10-CM | POA: Diagnosis present

## 2011-03-13 DIAGNOSIS — A0472 Enterocolitis due to Clostridium difficile, not specified as recurrent: Secondary | ICD-10-CM | POA: Diagnosis not present

## 2011-03-13 DIAGNOSIS — I6340 Cerebral infarction due to embolism of unspecified cerebral artery: Secondary | ICD-10-CM | POA: Diagnosis not present

## 2011-03-13 DIAGNOSIS — Z8673 Personal history of transient ischemic attack (TIA), and cerebral infarction without residual deficits: Secondary | ICD-10-CM | POA: Diagnosis present

## 2011-03-13 DIAGNOSIS — M129 Arthropathy, unspecified: Secondary | ICD-10-CM | POA: Diagnosis present

## 2011-03-13 DIAGNOSIS — R404 Transient alteration of awareness: Secondary | ICD-10-CM | POA: Diagnosis not present

## 2011-03-13 DIAGNOSIS — IMO0001 Reserved for inherently not codable concepts without codable children: Secondary | ICD-10-CM | POA: Diagnosis not present

## 2011-03-13 DIAGNOSIS — R9389 Abnormal findings on diagnostic imaging of other specified body structures: Secondary | ICD-10-CM | POA: Diagnosis not present

## 2011-03-13 DIAGNOSIS — L97809 Non-pressure chronic ulcer of other part of unspecified lower leg with unspecified severity: Secondary | ICD-10-CM | POA: Diagnosis not present

## 2011-03-13 DIAGNOSIS — D352 Benign neoplasm of pituitary gland: Secondary | ICD-10-CM | POA: Diagnosis present

## 2011-03-13 DIAGNOSIS — Z8639 Personal history of other endocrine, nutritional and metabolic disease: Secondary | ICD-10-CM | POA: Diagnosis present

## 2011-03-13 DIAGNOSIS — I639 Cerebral infarction, unspecified: Secondary | ICD-10-CM

## 2011-03-13 LAB — URINALYSIS, ROUTINE W REFLEX MICROSCOPIC
Nitrite: NEGATIVE
Specific Gravity, Urine: 1.008 (ref 1.005–1.030)
Urobilinogen, UA: 0.2 mg/dL (ref 0.0–1.0)

## 2011-03-13 LAB — COMPREHENSIVE METABOLIC PANEL
ALT: 21 U/L (ref 0–35)
AST: 25 U/L (ref 0–37)
CO2: 23 mEq/L (ref 19–32)
Calcium: 9.7 mg/dL (ref 8.4–10.5)
Sodium: 135 mEq/L (ref 135–145)
Total Protein: 7.2 g/dL (ref 6.0–8.3)

## 2011-03-13 LAB — CBC
HCT: 41.8 % (ref 36.0–46.0)
MCHC: 34.4 g/dL (ref 30.0–36.0)
MCV: 88.6 fL (ref 78.0–100.0)
Platelets: 229 10*3/uL (ref 150–400)
RDW: 13.2 % (ref 11.5–15.5)
RDW: 13.5 % (ref 11.5–15.5)
WBC: 11 10*3/uL — ABNORMAL HIGH (ref 4.0–10.5)

## 2011-03-13 LAB — CK TOTAL AND CKMB (NOT AT ARMC): Relative Index: INVALID (ref 0.0–2.5)

## 2011-03-13 LAB — DIFFERENTIAL
Basophils Absolute: 0.1 10*3/uL (ref 0.0–0.1)
Eosinophils Absolute: 0.3 10*3/uL (ref 0.0–0.7)
Eosinophils Relative: 3 % (ref 0–5)
Lymphocytes Relative: 14 % (ref 12–46)

## 2011-03-13 LAB — PROTIME-INR
INR: 1.06 (ref 0.00–1.49)
Prothrombin Time: 14 seconds (ref 11.6–15.2)

## 2011-03-13 LAB — URINE MICROSCOPIC-ADD ON

## 2011-03-13 LAB — TROPONIN I: Troponin I: <0.3 ng/mL (ref <0.30)

## 2011-03-13 LAB — APTT: aPTT: 28 seconds (ref 24–37)

## 2011-03-13 MED ORDER — CLOPIDOGREL BISULFATE 75 MG PO TABS
75.0000 mg | ORAL_TABLET | Freq: Every day | ORAL | Status: DC
  Administered 2011-03-14 – 2011-03-15 (×2): 75 mg via ORAL
  Filled 2011-03-13 (×3): qty 1

## 2011-03-13 MED ORDER — THIAMINE HCL 100 MG/ML IJ SOLN
Freq: Once | INTRAVENOUS | Status: DC
  Filled 2011-03-13: qty 1000

## 2011-03-13 MED ORDER — ASPIRIN 325 MG PO TABS
325.0000 mg | ORAL_TABLET | Freq: Every day | ORAL | Status: DC
  Filled 2011-03-13: qty 1

## 2011-03-13 MED ORDER — ACETAMINOPHEN 650 MG RE SUPP: 650.0000 mg | RECTAL | Status: DC | PRN

## 2011-03-13 MED ORDER — LISINOPRIL 40 MG PO TABS
40.0000 mg | ORAL_TABLET | Freq: Every day | ORAL | Status: DC
  Administered 2011-03-13 – 2011-03-15 (×3): 40 mg via ORAL
  Filled 2011-03-13 (×3): qty 1

## 2011-03-13 MED ORDER — LORAZEPAM 2 MG/ML IJ SOLN
1.0000 mg | Freq: Once | INTRAMUSCULAR | Status: DC
  Filled 2011-03-13: qty 1

## 2011-03-13 MED ORDER — INSULIN ASPART 100 UNIT/ML ~~LOC~~ SOLN
0.0000 [IU] | Freq: Three times a day (TID) | SUBCUTANEOUS | Status: DC
  Administered 2011-03-14: 1 [IU] via SUBCUTANEOUS
  Administered 2011-03-14: 3 [IU] via SUBCUTANEOUS
  Administered 2011-03-14 – 2011-03-15 (×2): 1 [IU] via SUBCUTANEOUS
  Administered 2011-03-15: 3 [IU] via SUBCUTANEOUS
  Filled 2011-03-13: qty 3

## 2011-03-13 MED ORDER — ENOXAPARIN SODIUM 40 MG/0.4ML ~~LOC~~ SOLN
40.0000 mg | SUBCUTANEOUS | Status: DC
  Administered 2011-03-13 – 2011-03-15 (×3): 40 mg via SUBCUTANEOUS
  Filled 2011-03-13 (×3): qty 0.4

## 2011-03-13 MED ORDER — ENOXAPARIN SODIUM 40 MG/0.4ML ~~LOC~~ SOLN: 40.0000 mg | SUBCUTANEOUS | Status: DC

## 2011-03-13 MED ORDER — ACETAMINOPHEN 325 MG PO TABS: 650.0000 mg | ORAL_TABLET | ORAL | Status: DC | PRN

## 2011-03-13 MED ORDER — SENNOSIDES-DOCUSATE SODIUM 8.6-50 MG PO TABS: 1.0000 | ORAL_TABLET | Freq: Every evening | ORAL | Status: DC | PRN

## 2011-03-13 MED ORDER — ASPIRIN EC 81 MG PO TBEC
81.0000 mg | DELAYED_RELEASE_TABLET | Freq: Every day | ORAL | Status: DC
  Administered 2011-03-13: 81 mg via ORAL
  Filled 2011-03-13 (×2): qty 1

## 2011-03-13 MED ORDER — AMLODIPINE BESYLATE 5 MG PO TABS
5.0000 mg | ORAL_TABLET | Freq: Every day | ORAL | Status: DC
  Administered 2011-03-13 – 2011-03-14 (×2): 5 mg via ORAL
  Filled 2011-03-13 (×2): qty 1

## 2011-03-13 MED ORDER — LORAZEPAM 2 MG/ML IJ SOLN
1.0000 mg | Freq: Once | INTRAMUSCULAR | Status: AC
  Administered 2011-03-13: 1 mg via INTRAVENOUS
  Filled 2011-03-13: qty 1

## 2011-03-13 MED ORDER — ASPIRIN 81 MG PO CHEW
324.0000 mg | CHEWABLE_TABLET | Freq: Once | ORAL | Status: AC
  Administered 2011-03-13: 324 mg via ORAL
  Filled 2011-03-13: qty 4

## 2011-03-13 MED ORDER — ONDANSETRON HCL 4 MG/2ML IJ SOLN: 4.0000 mg | Freq: Four times a day (QID) | INTRAMUSCULAR | Status: DC | PRN

## 2011-03-13 MED ORDER — HYDRALAZINE HCL 20 MG/ML IJ SOLN
10.0000 mg | Freq: Three times a day (TID) | INTRAMUSCULAR | Status: DC | PRN
  Administered 2011-03-14: 10 mg via INTRAVENOUS
  Filled 2011-03-13: qty 0.5

## 2011-03-13 MED ORDER — INSULIN ASPART 100 UNIT/ML ~~LOC~~ SOLN: 0.0000 [IU] | Freq: Every day | SUBCUTANEOUS | Status: DC

## 2011-03-13 MED ORDER — ASPIRIN 300 MG RE SUPP
300.0000 mg | Freq: Every day | RECTAL | Status: DC
  Filled 2011-03-13: qty 1

## 2011-03-13 MED ORDER — LABETALOL HCL 5 MG/ML IV SOLN: 10.0000 mg | Freq: Once | INTRAVENOUS | Status: DC

## 2011-03-13 NOTE — Progress Notes (Signed)
All inserted under wrong time. Should have been 1315

## 2011-03-13 NOTE — Consult Note (Addendum)
Chief Complaint: "right hemiparesis and slurred speech"  HPI: Christina Pittman is an 78 y.o. female who was found down at 2 am by her family with right-sided weakness and slurred speech. She was brought to an affiliate hospital and transferred here for an evaluation. She was not a t-pa candidate as she was out of the window by the time of arrival to this hospital.   LSN: 10 pm tPA Given: No: out of the window mRankin: 2  Past Medical History  Diagnosis Date  . Arthritis   . Diabetes mellitus   . Hypertension   . Heart murmur    Past Surgical History  Procedure Date  . Abdominal hysterectomy    History reviewed. No pertinent family history. Social History:  reports that she has never smoked. She does not have any smokeless tobacco history on file. She reports that she does not drink alcohol or use illicit drugs.  Allergies:  Allergies  Allergen Reactions  . Betadine (Povidone Iodine) Hives  . Neosporin (Neomycin-Polymyx-Gramicid) Hives   Medications: I have reviewed the patient's current medications.  ROS: as above  Physical Examination: Blood pressure 166/104, pulse 105, temperature 97.7 F (36.5 C), temperature source Oral, resp. rate 20, SpO2 95.00%.  Neurologic Examination: MS: drowsy, AAO*1, follows simple commands, no obvious aphasia CN: EOMI, PERRL, no face asymmetry, V1-V3 sensation is intact, tongue is midline Motor: right arm 1/5, right leg 2/5, left arm 5/5, left leg 5/5 distally, and 3-/5 proximally Sensory: no deficit to LT/PP Coord: F to N intact on L Reflexes: hyporeflexic throughout, mute plantars b/l Gait: deferred  Results for orders placed during the hospital encounter of 03/13/11 (from the past 48 hour(s))  CBC     Status: Abnormal   Collection Time   03/13/11  8:20 AM      Component Value Range Comment   WBC 11.0 (*) 4.0 - 10.5 (K/uL)    RBC 4.30  3.87 - 5.11 (MIL/uL)    Hemoglobin 13.2  12.0 - 15.0 (g/dL)    HCT 82.9  56.2 - 13.0 (%)    MCV 88.6   78.0 - 100.0 (fL)    MCH 30.7  26.0 - 34.0 (pg)    MCHC 34.6  30.0 - 36.0 (g/dL)    RDW 86.5  78.4 - 69.6 (%)    Platelets 229  150 - 400 (K/uL)   DIFFERENTIAL     Status: Abnormal   Collection Time   03/13/11  8:20 AM      Component Value Range Comment   Neutrophils Relative 75  43 - 77 (%)    Neutro Abs 8.3 (*) 1.7 - 7.7 (K/uL)    Lymphocytes Relative 14  12 - 46 (%)    Lymphs Abs 1.5  0.7 - 4.0 (K/uL)    Monocytes Relative 8  3 - 12 (%)    Monocytes Absolute 0.8  0.1 - 1.0 (K/uL)    Eosinophils Relative 3  0 - 5 (%)    Eosinophils Absolute 0.3  0.0 - 0.7 (K/uL)    Basophils Relative 1  0 - 1 (%)    Basophils Absolute 0.1  0.0 - 0.1 (K/uL)   COMPREHENSIVE METABOLIC PANEL     Status: Abnormal   Collection Time   03/13/11  8:20 AM      Component Value Range Comment   Sodium 135  135 - 145 (mEq/L)    Potassium 4.6  3.5 - 5.1 (mEq/L)    Chloride 102  96 - 112 (  mEq/L)    CO2 23  19 - 32 (mEq/L)    Glucose, Bld 138 (*) 70 - 99 (mg/dL)    BUN 14  6 - 23 (mg/dL)    Creatinine, Ser 1.61  0.50 - 1.10 (mg/dL)    Calcium 9.7  8.4 - 10.5 (mg/dL)    Total Protein 7.2  6.0 - 8.3 (g/dL)    Albumin 3.7  3.5 - 5.2 (g/dL)    AST 25  0 - 37 (U/L)    ALT 21  0 - 35 (U/L)    Alkaline Phosphatase 88  39 - 117 (U/L)    Total Bilirubin 0.3  0.3 - 1.2 (mg/dL)    GFR calc non Af Amer 55 (*) >90 (mL/min)    GFR calc Af Amer 64 (*) >90 (mL/min)   CK TOTAL AND CKMB     Status: Abnormal   Collection Time   03/13/11  8:20 AM      Component Value Range Comment   Total CK 90  7 - 177 (U/L)    CK, MB 4.1 (*) 0.3 - 4.0 (ng/mL)    Relative Index RELATIVE INDEX IS INVALID  0.0 - 2.5    TROPONIN I     Status: Normal   Collection Time   03/13/11  8:20 AM      Component Value Range Comment   Troponin I <0.30  <0.30 (ng/mL)   APTT     Status: Normal   Collection Time   03/13/11  8:20 AM      Component Value Range Comment   aPTT 28  24 - 37 (seconds)   PROTIME-INR     Status: Normal   Collection Time    03/13/11  8:20 AM      Component Value Range Comment   Prothrombin Time 14.0  11.6 - 15.2 (seconds)    INR 1.06  0.00 - 1.49    GLUCOSE, CAPILLARY     Status: Abnormal   Collection Time   03/13/11  8:26 AM      Component Value Range Comment   Glucose-Capillary 144 (*) 70 - 99 (mg/dL)   CBC     Status: Abnormal   Collection Time   03/13/11  1:06 PM      Component Value Range Comment   WBC 12.8 (*) 4.0 - 10.5 (K/uL)    RBC 4.66  3.87 - 5.11 (MIL/uL)    Hemoglobin 14.4  12.0 - 15.0 (g/dL)    HCT 09.6  04.5 - 40.9 (%)    MCV 89.7  78.0 - 100.0 (fL)    MCH 30.9  26.0 - 34.0 (pg)    MCHC 34.4  30.0 - 36.0 (g/dL)    RDW 81.1  91.4 - 78.2 (%)    Platelets 259  150 - 400 (K/uL)   URINALYSIS, ROUTINE W REFLEX MICROSCOPIC     Status: Abnormal   Collection Time   03/13/11  4:32 PM      Component Value Range Comment   Color, Urine YELLOW  YELLOW     APPearance CLEAR  CLEAR     Specific Gravity, Urine 1.008  1.005 - 1.030     pH 7.5  5.0 - 8.0     Glucose, UA NEGATIVE  NEGATIVE (mg/dL)    Hgb urine dipstick TRACE (*) NEGATIVE     Bilirubin Urine NEGATIVE  NEGATIVE     Ketones, ur NEGATIVE  NEGATIVE (mg/dL)    Protein, ur NEGATIVE  NEGATIVE (mg/dL)  Urobilinogen, UA 0.2  0.0 - 1.0 (mg/dL)    Nitrite NEGATIVE  NEGATIVE     Leukocytes, UA NEGATIVE  NEGATIVE    URINE MICROSCOPIC-ADD ON     Status: Normal   Collection Time   03/13/11  4:32 PM      Component Value Range Comment   Squamous Epithelial / LPF RARE  RARE     WBC, UA 0-2  <3 (WBC/hpf)    RBC / HPF 0-2  <3 (RBC/hpf)    Dg Chest 2 View  03/13/2011  *RADIOLOGY REPORT*  Clinical Data: Leg weakness and slurred speech.  Right-sided weakness.  CHEST - 2 VIEW  Comparison: 10/18/2007.  Findings: Trachea is midline.  Heart size stable. Mild interstitial prominence appears unchanged from 10/18/2007.  Biapical pleural parenchymal scarring.  No acute airspace consolidation.  No pleural fluid.  IMPRESSION: No acute findings.  Original Report  Authenticated By: Reyes Ivan, M.D.   Ct Head Wo Contrast  03/13/2011  *RADIOLOGY REPORT*  Clinical Data: Weakness.  Possible slurred speech.  History of hypertension.  History of diabetes.  CT HEAD WITHOUT CONTRAST  Technique:  Contiguous axial images were obtained from the base of the skull through the vertex without contrast.  Comparison: Most recent CT head 06/29/2010.  Findings: There is no evidence for acute infarction, intracranial hemorrhage, mass lesion, hydrocephalus, or extra-axial fluid. Age appropriate atrophy is present (78 years old.  Chronic microvascular ischemic change is present in the periventricular and subcortical white matter.  The sella may be mildly expanded, extending focally downward on the right into the sphenoid sinus.  A small pituitary adenoma could cause this remodeling, or perhaps empty sella although less likely. No suprasellar extension.  If clinically indicated, MRI without and with contrast with attention to the pituitary could be helpful in further evaluation.  Bilateral chronic basal ganglia lacunar infarcts are present, slightly larger on the left.  No visible acute cortical infarct. The calvarium is intact.  There is moderately advanced vascular calcification, particularly in the proximal basilar and distal left vertebral.   There is a large sebaceous cyst in the left parietal scalp. There is no acute sinus or mastoid disease.  IMPRESSION: No acute stroke or bleed.  Atrophy and small vessel disease is similar to priors.  The sella may be focally expanded inferiorly on the right. Pituitary adenoma not excluded.  Original Report Authenticated By: Elsie Stain, M.D.   Mr Brain Wo Contrast  03/13/2011  *RADIOLOGY REPORT*  Clinical Data: Right-sided weakness with slurred speech.  MRI HEAD WITHOUT CONTRAST  Technique:  Multiplanar, multiecho pulse sequences of the brain and surrounding structures were obtained according to standard protocol without intravenous  contrast.  Comparison: 03/13/2011 head CT.  No comparison brain MR.  Findings: The patient was only able to complete three sequences.  Acute non hemorrhagic scattered small infarcts involving portions of the left sub insular region, left external capsule, posterior left corona radiata and left parietal lobe.  No obvious intracranial hemorrhage.  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  Remote basal ganglia/mid corona radiata infarct.  Moderate small vessel disease type changes.  Global atrophy without hydrocephalus.  Major intracranial vascular structures appear patent.  IMPRESSION: The patient was only able to complete three sequences.  Acute non hemorrhagic scattered small infarcts involving portions of the left sub insular region, left external capsule, posterior left corona radiata and left parietal lobe.  2 cm pituitary mass  suggestive of pituitary macroadenoma with extension into the undersurface of the optic chiasm.  Remote basal ganglia/mid corona radiata infarct.  Moderate small vessel disease type changes.  Global atrophy without hydrocephalus.  Original Report Authenticated By: Fuller Canada, M.D.   Assessment: 78 y.o. female with right-hemiparesis and slurred speech with several acute left-hemisphere strokes on MRI  Stroke Risk Factors - none  Plan: 1. HgbA1c, fasting lipid panel 2. MRI, MRA  of the brain without contrast - done 3. PT consult, OT consult, Speech consult 4. Echocardiogram 5. Carotid dopplers 6. Prophylactic therapy-Antiplatelet med: Aspirin - dose 325 mg PO daily 7. Risk factor modification  8. Keep MAP b/w 120 to 130 9. IV fluids 10. HOB at 30 degrees 11. Frequent neurochecks 12. Fall precautions 13. DVT prophylaxis 14. Telemetry  Christina Pittman 03/13/2011, 6:41 PM

## 2011-03-13 NOTE — Progress Notes (Signed)
Utilization Review Completed.Rhya Shan T2/13/2013   

## 2011-03-13 NOTE — Progress Notes (Signed)
Speech Language/Pathology Speech Language Pathology Evaluation Patient Details Name: Christina Pittman MRN: [id number] DOB: [birth year] Today's Date: 03/13/2011  Past Medical History:  Past Medical History  Diagnosis Date  . Arthritis   . Diabetes mellitus   . Hypertension   . Heart murmur    Past Surgical History:  Past Surgical History  Procedure Date  . Abdominal hysterectomy     SLP Assessment/Plan/Recommendation  Clinical Impression: Pt is 78 year-old female with baseline functional cognition, admitted with right hemiparesis and speech changes.  Head CT revealed no acute intracranial abnormalities, chronic bilateral BG infarcts.  MRI pending.  Pt's communication marked by mild expressive aphasia with verbal paraphasias, dysnomia.  Mild deficits high-level comprehension.  Rec acute care SLP f/u to address mild communication deficits.  Discussed with pt/family.    Pt has passed RN stroke swallow screen, hence SLP clinical swallow eval not warranted.   SLP Recommendation/Assessment: Patient will need skilled Speech Language Pathology Services in the acute care venue to address identified deficits Problem List: Verbal expression Therapy Diagnosis: Aphasia Type of Aphasia: Broca's (expressive) Plan Speech Therapy Frequency: min 2x/week Duration: 1 week Treatment/Interventions: Cueing hierarchy;Environmental controls;Language facilitation;Multimodal communcation approach Potential to Achieve Goals: Good SLP Recommendations Follow up Recommendations: Other (comment) (tba) Individuals Consulted Consulted and Agree with Results and Recommendations: Patient;Family member/caregiver Family Member Consulted :  multiple family members present  SLP Goals  SLP Goal #1: Pt will make needs known to others with supervision for word-retrieval.   SLP Goal #2: Pt will follow multi-step functional commands with min assist.   Marchelle Folks L. Samson Frederic, [place] CCC/SLP Pager [phone]  Blenda Mounts  Laurice 03/13/2011, 1:50 PM

## 2011-03-13 NOTE — Progress Notes (Signed)
Bilateral carotid artery duplex completed.  Preliminary report is no evidence of significant ICA stenosis. 

## 2011-03-13 NOTE — ED Notes (Signed)
Patients grip not as strong on R side with R hand drift,, family last saw patient normal at 22:00

## 2011-03-13 NOTE — ED Provider Notes (Signed)
History     CSN: [id number]  Arrival date & time 03/13/11  6295   First MD Initiated Contact with Patient 03/13/11 [phone]      Chief Complaint  Patient presents with  . Weakness   elderly female was last seen, normal by her family at 10 PM last night. She [place] alone, [place] sons apparently[place]. She had gotten up at 5 AM and felt weakness in her right arm and leg and sat down on the floor. She called her family because she could not get up. Initially, the family thought that she was slurring her words. The patient was complaining of right-sided weakness. At that time. EMS. Her symptoms were improving. Upon their arrival. The family stated that the primary medical doctor was trying to regulate her blood pressure, which have been running high. EMS noted. Decreased grip strength on the right. The patient has no specific complaints. She states she "feels fine." However she is unable to lift her right arm against gravity. She is unable to hold her right leg up off the bed for more than 1 seconds. There is no significant facial droop. Her speech appears to be initially clear. She has no chest pain or palpitations. She denied any trauma from being on the floor. The family states [place] doctor is in [place]. Denies any recent illness.  (Consider location/radiation/quality/duration/timing/severity/associated sxs/prior treatment) HPI  Past Medical History  Diagnosis Date  . Arthritis   . Diabetes mellitus   . Hypertension   . Heart murmur     Past Surgical History  Procedure Date  . Abdominal hysterectomy     No family history on file.  History  Substance Use Topics  . Smoking status: Never Smoker   . Smokeless tobacco: Not on file  . Alcohol Use: No    OB History    Grav Para Term Preterm Abortions TAB SAB Ect Mult Living                  Review of Systems  Constitutional: Negative for fever.  Respiratory: Negative for apnea.   Cardiovascular: Negative for chest  pain.  Neurological: Negative for seizures and numbness.  All other systems reviewed and are negative.    Allergies  Betadine and Neosporin  Home Medications   Current Outpatient Rx  Name Route Sig Dispense Refill  . TENEX PO Oral Take by mouth.    . ACETAMINOPHEN 500 MG PO TABS Oral Take 500 mg by mouth every 6 (six) hours as needed. For pain     . ASPIRIN EC 81 MG PO TBEC Oral Take 81 mg by mouth daily.      Marland Kitchen LISINOPRIL 10 MG PO TABS Oral Take 10 mg by mouth daily.      Marland Kitchen METFORMIN HCL 500 MG PO TABS Oral Take 500 mg by mouth 2 (two) times daily with a meal.        BP 191/100  Pulse 83  Temp(Src) 97.7 F (36.5 C) (Oral)  Resp 18  SpO2 98%  Physical Exam  Nursing note and vitals reviewed. Constitutional: She is oriented to person, place, and time. She appears well-developed and well-nourished.  HENT:  Head: Normocephalic and atraumatic.  Eyes: Conjunctivae and EOM are normal. Pupils are equal, round, and reactive to light.  Neck: Neck supple.  Cardiovascular: Normal rate and regular rhythm.  Exam reveals no gallop and no friction rub.   No murmur heard. Pulmonary/Chest: Breath sounds normal. She has no  wheezes. She has no rales. She exhibits no tenderness.  Abdominal: Soft. Bowel sounds are normal. She exhibits no distension. There is no tenderness. There is no rebound and no guarding.  Musculoskeletal: Normal range of motion.  Neurological: She is alert and oriented to person, place, and time. No cranial nerve deficit. Coordination normal.       Patient is awake, alert, and oriented. Obvious pronator drift on the right. Right upper shin is clumsy and unable to against gravity. Finger to nose testing. Left is normal. Unable to examine the right. Patient unable to right lower extremity off the bed for more than approximately 1 seconds. Reflexes are slightly brisk on the right, downgoing toes, and the left slightly upgoing on the right. Gait not examined secondary to weakness.   Skin: Skin is warm and dry. No rash noted.  Psychiatric: She has a normal mood and affect.    ED Course  Procedures (including critical care time)   Labs Reviewed  CBC  DIFFERENTIAL  COMPREHENSIVE METABOLIC PANEL  CK TOTAL AND CKMB  TROPONIN I  APTT  PROTIME-INR   No results found.   No diagnosis found.    MDM  Patient is seen and examined, initial history and physical is completed. Evaluation initiated      IV O2 monitor , ECG. Page the stroke, M.D., however, patient is likely out of the time frame for TPA. Will obtain stat head CT and arrange for transfer to [facility].    Date: 03/13/2011  Rate: 78  Rhythm: normal sinus rhythm  QRS Axis: normal  Intervals: PR prolonged  ST/T Wave abnormalities: nonspecific ST changes and nonspecific ST/T changes  Conduction Disutrbances:first-degree A-V block  and nonspecific intraventricular conduction delay  Narrative Interpretation:   Old EKG Reviewed: changes noted  LVH w/ repol abnormality, HR decreased from Sept 30, 2012   8:48 AM  Discussed with neurology on call, Dr. Lyman Speller. He can patient is out of the timeframe for TPA. He is recommending admission to the triad service. The triad service has been paged and will arrange for transfer. We'll administer an aspirin as the CT showed no evidence of any bleeding.  Tamakia Porto A. Patrica Duel, MD 03/13/11 1610

## 2011-03-13 NOTE — ED Notes (Signed)
Family informed of room assignment and procedure

## 2011-03-13 NOTE — ED Notes (Signed)
RN for 3000 informed that patient not given labetalol prior to leaving

## 2011-03-13 NOTE — Progress Notes (Signed)
Off the floor until now. (MRI) Resuming Q2 vitals

## 2011-03-13 NOTE — H&P (Signed)
PCP:   No primary provider on file.   Chief Complaint:  Right-sided weakness and slurred speech.  HPI: 78 year old female with a past medical history significant for diabetes, hypertension, hyperlipidemia; who came to the major department secondary to right-sided weakness and dysarthria. Patient was last seen normal by family members around 10 PM last night. Patient lives alone and isn't to her sons apparently oral form. Patient had gotten up around 5 AM and  felt to be weak on her right side (especially arm) and also some dysarthria noted by family members. By the time EMS arrived to her house her symptoms were improving slightly, Upon arrival to emergency department patient unable to leave her right arm he gains gravity, she was also unable to hold her right leg up off the bed for more than one second. There was no facial droop. Her speech appears to be clear. No chest pain or palpitations. Patient denies any trauma or recent illness. No fever, no chills, no nausea/vomiting/diarrhea or any other acute complaints. CT scan of the head failed to demonstrated any acute abnormalities. Patient was admitted for further evaluation and treatment.  Note patient systolic blood pressure on arrival was in the 190s range; per family member she has been having some trouble controlling her BP lately and they endorses some complaints of headache.   Allergies:   Allergies  Allergen Reactions  . Betadine (Povidone Iodine) Hives  . Neosporin (Neomycin-Polymyx-Gramicid) Hives      Past Medical History  Diagnosis Date  . Arthritis   . Diabetes mellitus   . Hypertension   . Heart murmur     Past Surgical History  Procedure Date  . Abdominal hysterectomy     Prior to Admission medications   Medication Sig Start Date End Date Taking? Authorizing Provider  acetaminophen (TYLENOL) 500 MG tablet Take 500 mg by mouth every 6 (six) hours as needed. For pain   Yes Historical Provider, MD  aspirin EC 81 MG  tablet Take 81 mg by mouth daily.    Yes Historical Provider, MD  guanFACINE (TENEX) 1 MG tablet Take 1 mg by mouth 2 (two) times daily. Takes 2 tabs in am 1 in evening   Yes Historical Provider, MD  HYDROcodone-acetaminophen (NORCO) 5-325 MG per tablet Take 1 tablet by mouth every 6 (six) hours as needed. For foot pain   Yes Historical Provider, MD  lisinopril (PRINIVIL,ZESTRIL) 10 MG tablet Take 10 mg by mouth daily.    Yes Historical Provider, MD  metFORMIN (GLUCOPHAGE) 500 MG tablet Take 500 mg by mouth daily.   Yes Historical Provider, MD    Social History:  reports that she has never smoked. She does not have any smokeless tobacco history on file. She reports that she does not drink alcohol or use illicit drugs.  History reviewed. No pertinent family history.  Review of Systems:  Negative except as mentioned on HPI.   Physical Exam: Blood pressure 192/114, pulse 80, temperature 98.7 F (37.1 C), temperature source Oral, resp. rate 18, SpO2 100.00%. General: Alert, awake and oriented x3; lying on bed no acute distress. Cooperative to examination and able to state in full sentences. No facial droop appreciated. HEENT: Normocephalic, atraumatic; PERRLA, extra ocular muscles intact, no icterus; moist mucous membranes, no exudates, no erythema, edentulous; no discharges appreciated outpatient Ears and nostrils. Neck: no thyromegaly, supple and w/o bruits. Heart: Positive systolic murmur, no rubs, RRR. Respiratory system: Clear to auscultation bilaterally. Abdomen: Soft no, nondistended, nontender and is, positive bowel  sounds. Extremities: decreased pulses bilaterally, trace of edema, significant bruises on her skin was and also likes. . Neurologic exam: Alert, awake and oriented x3, creatinine there 2-12 grossly intact, no facial droop, no dysarthria. Muscle strength 4/5 on her upper and lower left side extremities; 0 out of 5 on her right upper extremity and 3/5 on her right lower  extremity. Sensation to light-touch and pinprick was normal bilaterally upper and lower extremities.  Labs on Admission:  Results for orders placed during the hospital encounter of 03/13/11 (from the past 48 hour(s))  CBC     Status: Abnormal   Collection Time   03/13/11  8:20 AM      Component Value Range Comment   WBC 11.0 (*) 4.0 - 10.5 (K/uL)    RBC 4.30  3.87 - 5.11 (MIL/uL)    Hemoglobin 13.2  12.0 - 15.0 (g/dL)    HCT 16.1  09.6 - 04.5 (%)    MCV 88.6  78.0 - 100.0 (fL)    MCH 30.7  26.0 - 34.0 (pg)    MCHC 34.6  30.0 - 36.0 (g/dL)    RDW 40.9  81.1 - 91.4 (%)    Platelets 229  150 - 400 (K/uL)   DIFFERENTIAL     Status: Abnormal   Collection Time   03/13/11  8:20 AM      Component Value Range Comment   Neutrophils Relative 75  43 - 77 (%)    Neutro Abs 8.3 (*) 1.7 - 7.7 (K/uL)    Lymphocytes Relative 14  12 - 46 (%)    Lymphs Abs 1.5  0.7 - 4.0 (K/uL)    Monocytes Relative 8  3 - 12 (%)    Monocytes Absolute 0.8  0.1 - 1.0 (K/uL)    Eosinophils Relative 3  0 - 5 (%)    Eosinophils Absolute 0.3  0.0 - 0.7 (K/uL)    Basophils Relative 1  0 - 1 (%)    Basophils Absolute 0.1  0.0 - 0.1 (K/uL)   COMPREHENSIVE METABOLIC PANEL     Status: Abnormal   Collection Time   03/13/11  8:20 AM      Component Value Range Comment   Sodium 135  135 - 145 (mEq/L)    Potassium 4.6  3.5 - 5.1 (mEq/L)    Chloride 102  96 - 112 (mEq/L)    CO2 23  19 - 32 (mEq/L)    Glucose, Bld 138 (*) 70 - 99 (mg/dL)    BUN 14  6 - 23 (mg/dL)    Creatinine, Ser 7.82  0.50 - 1.10 (mg/dL)    Calcium 9.7  8.4 - 10.5 (mg/dL)    Total Protein 7.2  6.0 - 8.3 (g/dL)    Albumin 3.7  3.5 - 5.2 (g/dL)    AST 25  0 - 37 (U/L)    ALT 21  0 - 35 (U/L)    Alkaline Phosphatase 88  39 - 117 (U/L)    Total Bilirubin 0.3  0.3 - 1.2 (mg/dL)    GFR calc non Af Amer 55 (*) >90 (mL/min)    GFR calc Af Amer 64 (*) >90 (mL/min)   CK TOTAL AND CKMB     Status: Abnormal   Collection Time   03/13/11  8:20 AM      Component  Value Range Comment   Total CK 90  7 - 177 (U/L)    CK, MB 4.1 (*) 0.3 - 4.0 (ng/mL)  Relative Index RELATIVE INDEX IS INVALID  0.0 - 2.5    TROPONIN I     Status: Normal   Collection Time   03/13/11  8:20 AM      Component Value Range Comment   Troponin I <0.30  <0.30 (ng/mL)   APTT     Status: Normal   Collection Time   03/13/11  8:20 AM      Component Value Range Comment   aPTT 28  24 - 37 (seconds)   PROTIME-INR     Status: Normal   Collection Time   03/13/11  8:20 AM      Component Value Range Comment   Prothrombin Time 14.0  11.6 - 15.2 (seconds)    INR 1.06  0.00 - 1.49    GLUCOSE, CAPILLARY     Status: Abnormal   Collection Time   03/13/11  8:26 AM      Component Value Range Comment   Glucose-Capillary 144 (*) 70 - 99 (mg/dL)     Radiological Exams on Admission: Dg Chest 2 View  03/13/2011  *RADIOLOGY REPORT*  Clinical Data: Leg weakness and slurred speech.  Right-sided weakness.  CHEST - 2 VIEW  Comparison: 10/18/2007.  Findings: Trachea is midline.  Heart size stable. Mild interstitial prominence appears unchanged from 10/18/2007.  Biapical pleural parenchymal scarring.  No acute airspace consolidation.  No pleural fluid.  IMPRESSION: No acute findings.  Original Report Authenticated By: Reyes Ivan, M.D.   Ct Head Wo Contrast  03/13/2011  *RADIOLOGY REPORT*  Clinical Data: Weakness.  Possible slurred speech.  History of hypertension.  History of diabetes.  CT HEAD WITHOUT CONTRAST  Technique:  Contiguous axial images were obtained from the base of the skull through the vertex without contrast.  Comparison: Most recent CT head 06/29/2010.  Findings: There is no evidence for acute infarction, intracranial hemorrhage, mass lesion, hydrocephalus, or extra-axial fluid. Age appropriate atrophy is present (78 years old.  Chronic microvascular ischemic change is present in the periventricular and subcortical white matter.  The sella may be mildly expanded, extending focally  downward on the right into the sphenoid sinus.  A small pituitary adenoma could cause this remodeling, or perhaps empty sella although less likely. No suprasellar extension.  If clinically indicated, MRI without and with contrast with attention to the pituitary could be helpful in further evaluation.  Bilateral chronic basal ganglia lacunar infarcts are present, slightly larger on the left.  No visible acute cortical infarct. The calvarium is intact.  There is moderately advanced vascular calcification, particularly in the proximal basilar and distal left vertebral.   There is a large sebaceous cyst in the left parietal scalp. There is no acute sinus or mastoid disease.  IMPRESSION: No acute stroke or bleed.  Atrophy and small vessel disease is similar to priors.  The sella may be focally expanded inferiorly on the right. Pituitary adenoma not excluded.  Original Report Authenticated By: Elsie Stain, M.D.     Assessment/Plan 1-right sided weakness and slurred speech: Patient will be admitted to telemetry, will follow a stroke workup; will start the patient on Plavix 75 mg by mouth daily will continue aspirin 81 mg by mouth daily. Neuro checks every 4 hours and order PT, OT, speech therapy evaluation and treatment. Will follow results and a start/adjust treatment as per patient evolution. Diagnosis most likely stroke versus TIA. First include hypertension, diabetes, hyperlipidemia and peripheral vascular disease.  2-Accelerated hypertension: Will increase patient's lisinopril and also start amlodipine on  daily basis. Will use hydralazine as needed for systolic blood pressure above 161 or diastolic blood pressure above 096. Will have son hypertensive tolerance do to acute concerns for stroke.  3- diabetes: Will check hemoglobin A1c and use a sliding scale insulin while she is in the hospital.  4-HLD: will check lipid profile and if needed will start statins.  5-DVT:lovenox      Time Spent on  Admission: 50 minutes  Cassadi Purdie [facility] [phone]  03/13/2011, 12:44 PM

## 2011-03-13 NOTE — ED Notes (Addendum)
Per ems & patient, she got up at 5am and felt weak in her legs and sat down in the floor, called family because she couldn't get up, family said they thought she was slurring her words a little, patient did not to call ems and shortly was able to sit up and walk, per ems no s/s of any deficits upon their arrival. Family states that her primary MD is trying to regulate her BP med & her BP has been running high lately, Grip weaker on R side some R sided weakness CBG 134 per ems

## 2011-03-13 NOTE — ED Notes (Signed)
cbg 144 

## 2011-03-13 NOTE — ED Notes (Signed)
Patient coughed after eating cracker, but was able to clear throat

## 2011-03-14 ENCOUNTER — Other Ambulatory Visit: Payer: Self-pay

## 2011-03-14 DIAGNOSIS — G4700 Insomnia, unspecified: Secondary | ICD-10-CM | POA: Diagnosis present

## 2011-03-14 DIAGNOSIS — I6350 Cerebral infarction due to unspecified occlusion or stenosis of unspecified cerebral artery: Secondary | ICD-10-CM | POA: Diagnosis not present

## 2011-03-14 DIAGNOSIS — E785 Hyperlipidemia, unspecified: Secondary | ICD-10-CM | POA: Diagnosis present

## 2011-03-14 DIAGNOSIS — R1310 Dysphagia, unspecified: Secondary | ICD-10-CM | POA: Diagnosis present

## 2011-03-14 DIAGNOSIS — G8190 Hemiplegia, unspecified affecting unspecified side: Secondary | ICD-10-CM | POA: Diagnosis not present

## 2011-03-14 DIAGNOSIS — G8110 Spastic hemiplegia affecting unspecified side: Secondary | ICD-10-CM | POA: Diagnosis not present

## 2011-03-14 DIAGNOSIS — I6340 Cerebral infarction due to embolism of unspecified cerebral artery: Secondary | ICD-10-CM | POA: Diagnosis not present

## 2011-03-14 DIAGNOSIS — Z8673 Personal history of transient ischemic attack (TIA), and cerebral infarction without residual deficits: Secondary | ICD-10-CM | POA: Diagnosis present

## 2011-03-14 DIAGNOSIS — Z8639 Personal history of other endocrine, nutritional and metabolic disease: Secondary | ICD-10-CM | POA: Diagnosis present

## 2011-03-14 DIAGNOSIS — R471 Dysarthria and anarthria: Secondary | ICD-10-CM | POA: Diagnosis not present

## 2011-03-14 DIAGNOSIS — I10 Essential (primary) hypertension: Secondary | ICD-10-CM | POA: Diagnosis present

## 2011-03-14 DIAGNOSIS — G8191 Hemiplegia, unspecified affecting right dominant side: Secondary | ICD-10-CM | POA: Diagnosis present

## 2011-03-14 DIAGNOSIS — Z131 Encounter for screening for diabetes mellitus: Secondary | ICD-10-CM

## 2011-03-14 LAB — HEMOGLOBIN A1C
Hgb A1c MFr Bld: 6.7 % — ABNORMAL HIGH (ref <5.7)
Mean Plasma Glucose: 146 mg/dL — ABNORMAL HIGH (ref <117)

## 2011-03-14 LAB — LIPID PANEL
Cholesterol: 191 mg/dL (ref 0–200)
Triglycerides: 114 mg/dL (ref <150)

## 2011-03-14 LAB — BASIC METABOLIC PANEL
CO2: 20 mEq/L (ref 19–32)
Chloride: 101 mEq/L (ref 96–112)
Creatinine, Ser: 0.96 mg/dL (ref 0.50–1.10)
Potassium: 4 mEq/L (ref 3.5–5.1)

## 2011-03-14 LAB — CBC
HCT: 43.7 % (ref 36.0–46.0)
Hemoglobin: 15.2 g/dL — ABNORMAL HIGH (ref 12.0–15.0)
MCH: 31 pg (ref 26.0–34.0)
MCHC: 34.8 g/dL (ref 30.0–36.0)
RDW: 13.7 % (ref 11.5–15.5)

## 2011-03-14 MED ORDER — ZOLPIDEM TARTRATE 5 MG PO TABS
5.0000 mg | ORAL_TABLET | Freq: Every evening | ORAL | Status: DC | PRN
  Administered 2011-03-14: 5 mg via ORAL
  Filled 2011-03-14: qty 1

## 2011-03-14 MED ORDER — SIMVASTATIN 20 MG PO TABS
20.0000 mg | ORAL_TABLET | Freq: Every day | ORAL | Status: DC
  Administered 2011-03-14: 20 mg via ORAL
  Filled 2011-03-14 (×2): qty 1

## 2011-03-14 MED ORDER — CARVEDILOL 12.5 MG PO TABS
12.5000 mg | ORAL_TABLET | Freq: Two times a day (BID) | ORAL | Status: DC
  Administered 2011-03-14 – 2011-03-15 (×2): 12.5 mg via ORAL
  Filled 2011-03-14 (×5): qty 1

## 2011-03-14 MED ORDER — FOOD THICKENER (THICKENUP CLEAR)
ORAL | Status: DC | PRN
  Filled 2011-03-14: qty 120

## 2011-03-14 MED ORDER — MAGNESIUM SULFATE 40 MG/ML IJ SOLN: 2.0000 g | Freq: Once | INTRAMUSCULAR | Status: DC

## 2011-03-14 MED ORDER — STARCH (THICKENING) PO POWD
ORAL | Status: DC | PRN
  Filled 2011-03-14: qty 227

## 2011-03-14 MED ORDER — LORAZEPAM 2 MG/ML IJ SOLN
1.0000 mg | Freq: Once | INTRAMUSCULAR | Status: AC
  Administered 2011-03-14: 0.5 mg via INTRAVENOUS

## 2011-03-14 MED ORDER — MAGNESIUM SULFATE IN D5W 10-5 MG/ML-% IV SOLN
1.0000 g | Freq: Once | INTRAVENOUS | Status: AC
  Administered 2011-03-14: 1 g via INTRAVENOUS
  Filled 2011-03-14: qty 100

## 2011-03-14 NOTE — Progress Notes (Signed)
Stroke Team Progress Note  HISTORY Christina Pittman is an 78 y.o. female who was found down at 2 am by her family with right-sided weakness and slurred speech. She was brought to an affiliate hospital and transferred here for an evaluation. She was not a t-pa candidate as she was out of the window by the time of arrival to this hospital.   SUBJECTIVE Her family is at the bedside. Overall she feels her condition is stable. Family reports difficulty swallowing this am. Pt lives alone. Family next door. They say they can provide care at discharge.   OBJECTIVE Filed Vitals:   03/13/11 2000 03/13/11 2030 03/14/11 0528 03/14/11 0939  BP:  150/98 176/107 124/78  Pulse:  100 92 105  Temp:  98.6 F (37 C) 98.1 F (36.7 C) 97.7 F (36.5 C)  TempSrc:  Oral Oral Oral  Resp:  20 20 20   Height: 5\' 2"  (1.575 m)     Weight: 61.78 kg (136 lb 3.2 oz)     SpO2:  95% 92% 94%   CBG (last 3)  Basename 03/13/11 2329 03/13/11 0826  GLUCAP 130* 144*   Intake/Output from previous day:   IV Fluid Intake:    Medications    . amLODipine  5 mg Oral Daily  . aspirin EC  81 mg Oral Daily  . clopidogrel  75 mg Oral Q breakfast  . enoxaparin  40 mg Subcutaneous Q24H  . insulin aspart  0-5 Units Subcutaneous QHS  . insulin aspart  0-9 Units Subcutaneous TID WC  . lisinopril  40 mg Oral Daily  . LORazepam  1 mg Intravenous Once  . LORazepam  1 mg Intravenous Once  . general admission iv infusion   Intravenous Once  . DISCONTD: aspirin  300 mg Rectal Daily  . DISCONTD: aspirin  325 mg Oral Daily  . DISCONTD: enoxaparin  40 mg Subcutaneous Q24H  . DISCONTD: labetalol  10 mg Intravenous Once  PRN acetaminophen, acetaminophen, hydrALAZINE, ondansetron (ZOFRAN) IV, senna-docusate  Diet:  Cardiac thin liquids Activity:   Up with assistance DVT Prophylaxis:  Lovenox 40 mg sq daily   Significant Diagnostic Studies: CBC    Component Value Date/Time   WBC 16.2* 03/14/2011 0705   RBC 4.90 03/14/2011 0705   HGB  15.2* 03/14/2011 0705   HCT 43.7 03/14/2011 0705   PLT 314 03/14/2011 0705   MCV 89.2 03/14/2011 0705   MCH 31.0 03/14/2011 0705   MCHC 34.8 03/14/2011 0705   RDW 13.7 03/14/2011 0705   LYMPHSABS 1.5 03/13/2011 0820   MONOABS 0.8 03/13/2011 0820   EOSABS 0.3 03/13/2011 0820   BASOSABS 0.1 03/13/2011 0820   CMP    Component Value Date/Time   NA 137 03/14/2011 0705   K 4.0 03/14/2011 0705   CL 101 03/14/2011 0705   CO2 20 03/14/2011 0705   GLUCOSE 128* 03/14/2011 0705   BUN 13 03/14/2011 0705   CREATININE 0.96 03/14/2011 0705   CALCIUM 10.1 03/14/2011 0705   PROT 7.2 03/13/2011 0820   ALBUMIN 3.7 03/13/2011 0820   AST 25 03/13/2011 0820   ALT 21 03/13/2011 0820   ALKPHOS 88 03/13/2011 0820   BILITOT 0.3 03/13/2011 0820   GFRNONAA 51* 03/14/2011 0705   GFRAA 59* 03/14/2011 0705   COAGS Lab Results  Component Value Date   INR 1.06 03/13/2011   Lipid Panel    Component Value Date/Time   CHOL 191 03/14/2011 0705   TRIG 114 03/14/2011 0705   HDL 50 03/14/2011  0705   CHOLHDL 3.8 03/14/2011 0705   VLDL 23 03/14/2011 0705   LDLCALC 118* 03/14/2011 0705   HgbA1C  No results found for this basename: HGBA1C   Urine Drug Screen  No results found for this basename: labopia, cocainscrnur, labbenz, amphetmu, thcu, labbarb    Alcohol Level No results found for this basename: eth   UA negative  CT of the brain   03/13/2011  No acute stroke or bleed.  Atrophy and small vessel disease is similar to priors.  The sella may be focally expanded inferiorly on the right. Pituitary adenoma not excluded.    MRI of the brain  The patient was only able to complete three sequences.  Acute non hemorrhagic scattered small infarcts involving portions of the left sub insular region, left external capsule, posterior left corona radiata and left parietal lobe.  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  Remote basal ganglia/mid corona radiata infarct.   Moderate small vessel disease type changes.  Global atrophy without hydrocephalus.    MRA of the brain  Not ordered   2D Echocardiogram  ordered   Carotid Doppler  No internal carotid artery stenosis bilaterally. Vertebrals with antegrade flow bilaterally.   CXR   03/13/2011    1.  Borderline cardiomegaly and chronic changes.  No acute disease.    03/13/2011    No acute findings  EKG  normal sinus rhythm, left axis deviation, 1st degree AV block.   Physical Exam   Pleasant elderly Caucasian lady currently not in distress. Afebrile. Head is nontraumatic. Neck is supple with soft bruit. Hearing is decreased bilaterally. Cardiac exam sinus tachycardia no murmur or gallop. Lungs clear to auscultation. Neurological exam awake alert oriented x2. Diminished attention, short-term memory and recall. Follow simple one-step commands only. Moderate dysarthria but can be understood.mild expressive aphasia with decreased word fluency and few paraphasias. Extraocular movements are full range without nystagmus. Blinks to threat bilaterally. Mild right lower facial weakness. Tongue is midline. Motor system exam reveals mild right upper extremity drift. Significant right grip and intrinsic hand muscle weakness.mild right lower extremity weakness 4/5. Diminished sensation on the right. Gait was not tested. But for high fall risk as per physical therapy ASSESSMENT Christina Pittman is a 78 y.o. female with a scattered small infarcts involving portions of the left sub insular region, left external capsule, posterior left corona radiata and left parietal lobe with right hemiparesis, mild expressive aphasia, verbal paraphasias, dysnomia and slurred speech. Infarcts secondary to likely embolic source, not a coumadin candidate due to fall risk, no further workup for embolic source. On aspirin 81 mg orally every day and clopidogrel 75 mg orally every day for secondary stroke prevention.  - pituitary tumor  - diabetes -  hypertension, acclerated - tachycardia  Hospital day # 1  TREATMENT/PLAN -therapy evaluations. Likely will need rehab.  - NPO. ST to check swallow. - follow up HgbA1c - discontinue ASA - Continue clopidogrel 75 mg orally every day for secondary stroke prevention. -aggressive risk factor control -long discussion with patient and multiple family members regarding her stroke and second prevention   SHARON BIBY, AVNP, ANP-BC, GNP-BC [facility] Pager: [phone] 03/14/2011 10:20 AM  Dr. Delia Heady, Stroke Center Medical Director, has personally reviewed chart, pertinent data, examined the patient and developed the plan of care.

## 2011-03-14 NOTE — Progress Notes (Signed)
Physical Therapy Evaluation Patient Details Name: Christina Pittman MRN: [id number] DOB: 06-21-1919 Today's Date: 03/14/2011  Problem List: There is no problem list on file for this patient.   Past Medical History:  Past Medical History  Diagnosis Date  . Arthritis   . Diabetes mellitus   . Hypertension   . Heart murmur    Past Surgical History:  Past Surgical History  Procedure Date  . Abdominal hysterectomy     PT Assessment/Plan/Recommendation PT Assessment Clinical Impression Statement: Pt presents with a medical diagnosis of a CVA with right sided weakness and slurred speech. Pt with decreased safety awareness during functional activity and decreased awareness of deficits. Pt will benefit from skilled PT in the acute care setting in order to maximize functional mobility PT Recommendation/Assessment: Patient will need skilled PT in the acute care venue PT Problem List: Decreased strength;Decreased activity tolerance;Decreased balance;Decreased mobility;Decreased knowledge of use of DME;Decreased safety awareness PT Therapy Diagnosis : Abnormality of gait;Hemiplegia dominant side PT Plan PT Frequency: Min 4X/week PT Treatment/Interventions: DME instruction;Gait training;Functional mobility training;Stair training;Therapeutic activities;Therapeutic exercise;Balance training;Neuromuscular re-education;Patient/family education PT Recommendation Recommendations for Other Services: Rehab consult Follow Up Recommendations: Inpatient Rehab Equipment Recommended: Defer to next venue PT Goals  Acute Rehab PT Goals PT Goal Formulation: With patient/family Time For Goal Achievement: 7 days Pt will go Supine/Side to Sit: with modified independence PT Goal: Supine/Side to Sit - Progress: Goal set today Pt will go Sit to Supine/Side: with modified independence PT Goal: Sit to Supine/Side - Progress: Goal set today Pt will go Sit to Stand: with supervision PT Goal: Sit to Stand - Progress:  Goal set today Pt will go Stand to Sit: with supervision PT Goal: Stand to Sit - Progress: Goal set today Pt will Transfer Bed to Chair/Chair to Bed: with supervision PT Transfer Goal: Bed to Chair/Chair to Bed - Progress: Goal set today Pt will Stand: with no upper extremity support;with supervision;1 - 2 min PT Goal: Stand - Progress: Goal set today Pt will Ambulate: 51 - 150 feet;with supervision;with least restrictive assistive device PT Goal: Ambulate - Progress: Goal set today Pt will Go Up / Down Stairs: 1-2 stairs;with rail(s);with supervision PT Goal: Up/Down Stairs - Progress: Goal set today  PT Evaluation Precautions/Restrictions  Precautions Precautions: Fall Restrictions Weight Bearing Restrictions: No Prior Functioning  Home Living Lives With: Alone Receives Help From: Family Type of Home: House Home Layout: One level Home Access: Stairs to enter;Ramped entrance (ramp in back) Entrance Stairs-Rails: Can reach both Entrance Stairs-Number of Steps: 1 Bathroom Shower/Tub: Health visitor: Handicapped height Bathroom Accessibility: Yes How Accessible: Accessible via walker Home Adaptive Equipment: Walker - rolling;Quad cane;Grab bars around toilet;Grab bars in shower Prior Function Level of Independence: Independent with basic ADLs;Independent with homemaking with ambulation;Independent with gait;Independent with transfers Able to Take Stairs?: Yes Driving: Yes Vocation: Retired Financial risk analyst Arousal/Alertness: Awake/alert Overall Cognitive Status: Impaired Orientation Level: Oriented X4 Safety/Judgement: Decreased awareness of safety precautions Decreased Safety/Judgement: Impulsive Awareness of Deficits: Decreased awareness of deficits Sensation/Coordination Sensation Light Touch: Appears Intact Coordination Heel Shin Test: normal coordination Extremity Assessment RLE Assessment RLE Assessment: Within Functional Limits LLE  Assessment LLE Assessment: Within Functional Limits Mobility (including Balance) Bed Mobility Bed Mobility: No (pt sitting in chair upon arrival) Transfers Transfers: Yes Sit to Stand: 3: Mod assist;With upper extremity assist;From chair/3-in-1 Sit to Stand Details (indicate cue type and reason): VC Stand to Sit: 3: Mod assist;With upper extremity assist;To chair/3-in-1 Stand to Sit Details: VC for hand  placement for safety. Assist to control descent into chair with tactile cues through pelvis Ambulation/Gait Ambulation/Gait: Yes Ambulation/Gait Assistance: 3: Mod assist Ambulation/Gait Assistance Details (indicate cue type and reason): Mod assist for ambulation sceondary to safety and stabilization as well as assist to control RW. Pt unable to use RUE to control RW, therefore was drifting to the right. Will attempt hand held ambulation next session. VC for technique and sequencing Ambulation Distance (Feet): 20 Feet (distance limited by HR) Assistive device: Rolling walker Gait Pattern: Step-to pattern;Decreased step length - left;Decreased stance time - right;Decreased hip/knee flexion - right;Decreased dorsiflexion - right Gait velocity: Decreased gait speed Stairs: No    Exercise    End of Session PT - End of Session Equipment Utilized During Treatment: Gait belt Activity Tolerance: Patient tolerated treatment well;Treatment limited secondary to medical complications (Comment) (increased HR) Patient left: in chair;with call bell in reach;with family/visitor present Nurse Communication: Mobility status for transfers;Mobility status for ambulation General Behavior During Session: [facility] for tasks performed Cognition: [facility] for tasks performed  Milana Kidney 03/14/2011, 10:39 AM  03/14/2011 Milana Kidney DPT PAGER: [phone] OFFICE: [phone]

## 2011-03-14 NOTE — Plan of Care (Signed)
Problem: Phase II Progression Outcomes Goal: Tolerating diet Swallow assessment completed.  See report for details.  Breck Coons Luis M. Cintron.Ed [employer] [phone]  03/14/2011

## 2011-03-14 NOTE — Progress Notes (Signed)
TCD completed at 13:30.

## 2011-03-14 NOTE — Evaluation (Signed)
Clinical/Bedside Swallow Evaluation Patient Details  Name: Christina Pittman MRN: [id number] DOB: 02/16/1919 Today's Date: 03/14/2011  Past Medical History:  Past Medical History  Diagnosis Date  . Arthritis   . Diabetes mellitus   . Hypertension   . Heart murmur    Past Surgical History:  Past Surgical History  Procedure Date  . Abdominal hysterectomy    HPI:  78 yr old admitted with slurred speech and right sided weakness.  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  Passed the RN swallow screen originally now with increased speech difficulties.  RN NP made pt. NPO and ordered ST swallow evaluation.  Pt.'s son reported some coughing while eating at home.   Assessment/Recommendations/Treatment Plan   SLP Assessment Clinical Impression Statement: Pt. exhibiting mild-mod oropharyngeal dysphagia marked by decreased lingual manipulation and residue with cracker.  Pharyngeal phase deficits characterized by throat clearing and delayed coughs after sips water x 2.  Pt. also vocalized immediately after swallow also increasing aspiration risk.  Nectar sips of juice with small sips prevented s/s aspiration.   Pt. appears to have compromised airway protection with increased aspiration risk with thin liquids which are prevented by a nectar thick consistency. Risk for Aspiration: Moderate Other Related Risk Factors: Cognitive impairment  Swallow Evaluation Recommendations Recommended Consults:  (MAY NEED MBS) Solid Consistency: Dysphagia 2 (Fine chop) Liquid Consistency: Nectar Liquid Administration via: Cup;No straw Medication Administration: Whole meds with puree Supervision: Full supervision/cueing for compensatory strategies Compensations: Slow rate;Small sips/bites;Check for pocketing (NO TALKING DURING MEALS) Postural Changes and/or Swallow Maneuvers: Seated upright 90 degrees Other Recommendations: Order thickener from pharmacy;Prohibited food (jello, ice cream, thin  soups) Follow up Recommendations:  (TBD)  Treatment Plan Treatment Plan Recommendations: Therapy as outlined in treatment plan below Speech Therapy Frequency: min 2x/week Treatment Duration: 2 weeks Interventions: Aspiration precaution training;Compensatory techniques;Patient/family education;Diet toleration management by SLP  Prognosis Prognosis for Safe Diet Advancement: Good Barriers to Reach Goals: Cognitive deficits  Individuals Consulted Consulted and Agree with Results and Recommendations: Patient;Family member/caregiver  Swallowing Goals  SLP Swallowing Goals Patient will consume recommended diet without observed clinical signs of aspiration with: Moderate assistance Patient will utilize recommended strategies during swallow to increase swallowing safety with: Moderate assistance  Swallow Study General  HPI: 78 yr old admitted with slurred speech and right sided weakness.  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  Passed the RN swallow screen originally now with increased speech difficulties.  RN NP made pt. NPO and ordered ST swallow evaluation.  Pt.'s son reported some coughing while eating at home. Type of Study: Bedside swallow evaluation Diet Prior to this Study: NPO Respiratory Status: Room air Behavior/Cognition: Alert;Cooperative;Distractible;Requires cueing Oral Cavity - Dentition: Edentulous Vision: Functional for self-feeding Patient Positioning: Upright in bed Baseline Vocal Quality: Clear Volitional Cough: Weak Volitional Swallow: Able to elicit  Oral Motor/Sensory Function  Overall Oral Motor/Sensory Function: Impaired Labial ROM: Within Functional Limits Labial Symmetry: Abnormal symmetry right Lingual ROM: Reduced right Lingual Symmetry: Abnormal symmetry right Velum:  (DECREASED MOVEMENT) Mandible: Within Functional Limits  Consistency Results  Ice Chips Ice chips: Not tested  Thin Liquid Thin Liquid:  Impaired Presentation: Cup Pharyngeal  Phase Impairments: Cough - Delayed;Throat Clearing - Immediate  Nectar Thick Liquid Nectar Thick Liquid: Within functional limits Presentation: Cup  Honey Thick Liquid Honey Thick Liquid: Not tested  Puree Puree: Within functional limits Presentation: Spoon;Self Fed  Solid Solid: Impaired Oral Phase Impairments: Reduced lingual movement/coordination Oral Phase  Functional Implications: Oral residue   Breck Coons Ronda M.Ed [employer] [phone]  03/14/2011

## 2011-03-14 NOTE — Evaluation (Signed)
Occupational Therapy Evaluation Patient Details Name: Christina Pittman MRN: [id number] DOB: 09-Mar-1919 Today's Date: 03/14/2011  Problem List: There is no problem list on file for this patient.   Past Medical History:  Past Medical History  Diagnosis Date  . Arthritis   . Diabetes mellitus   . Hypertension   . Heart murmur    Past Surgical History:  Past Surgical History  Procedure Date  . Abdominal hysterectomy     OT Assessment/Plan/Recommendation OT Assessment Clinical Impression Statement: 78 yo female s/p admitted with slurred speech and right sided weakness.  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 Pt with increased Rt UE weakness and decreased balance. Pt could benefit from CIR however pt's son requesting to take pt home at this time with hired (A). Pt will required MOD (A) 24/7 at this time. Pt is unable at this time to stand without MOD (A). OT Recommendation/Assessment: Patient will need skilled OT in the acute care venue OT Problem List: Decreased strength;Decreased activity tolerance;Impaired balance (sitting and/or standing);Impaired vision/perception;Decreased coordination;Decreased cognition;Decreased safety awareness;Decreased knowledge of use of DME or AE;Decreased knowledge of precautions;Cardiopulmonary status limiting activity;Impaired UE functional use OT Therapy Diagnosis : Generalized weakness;Cognitive deficits;Disturbance of vision;Hemiplegia dominant side OT Plan OT Frequency: Min 2X/week OT Treatment/Interventions: Self-care/ADL training;DME and/or AE instruction;Therapeutic activities;Cognitive remediation/compensation;Visual/perceptual remediation/compensation;Patient/family education;Balance training OT Recommendation Follow Up Recommendations: Home health OT (RECOMMEND CIR pt 's family declining at this time) Equipment Recommended: Defer to next venue Individuals Consulted Consulted and Agree with Results and Recommendations:  Family member/caregiver;Patient OT Goals Acute Rehab OT Goals OT Goal Formulation: With patient/family Time For Goal Achievement: 2 weeks ADL Goals Pt Will Perform Grooming: with cueing (comment type and amount) (min guard (A), Min v/c and min visual cues) ADL Goal: Grooming - Progress: Goal set today Pt Will Perform Upper Body Bathing: with min assist;Sitting, chair ADL Goal: Upper Body Bathing - Progress: Goal set today Pt Will Perform Upper Body Dressing: with min assist;Sitting, chair;Supported ADL Goal: Location manager Dressing - Progress: Goal set today Pt Will Transfer to Toilet: with min assist;3-in-1;Ambulation ADL Goal: Toilet Transfer - Progress: Goal set today Pt Will Perform Toileting - Clothing Manipulation: with min assist;Sitting on 3-in-1 or toilet ADL Goal: Toileting - Clothing Manipulation - Progress: Goal set today Pt Will Perform Toileting - Hygiene: with min assist;Standing at 3-in-1/toilet ADL Goal: Toileting - Hygiene - Progress: Goal set today Miscellaneous OT Goals Miscellaneous OT Goal #1: Pt will located 5 out 6 objects on Lt side of tray with only one simple verbal cue to locate object to facilitate compensatory strategies to look Lt OT Goal: Miscellaneous Goal #1 - Progress: Goal set today  OT Evaluation Precautions/Restrictions  Precautions Precautions: Fall Precaution Comments: impulsive Required Braces or Orthoses: No Restrictions Weight Bearing Restrictions: No Prior Functioning Home Living Lives With: Alone Receives Help From: Family Type of Home: House Home Layout: One level Home Access: Stairs to enter;Ramped entrance Entrance Stairs-Rails: Can reach both Entrance Stairs-Number of Steps: 1 Bathroom Shower/Tub: Health visitor: Handicapped height Bathroom Accessibility: Yes How Accessible: Accessible via walker Home Adaptive Equipment: Walker - rolling;Quad cane;Grab bars around toilet;Grab bars in shower Additional Comments:  Pt has family that leaves nearby and (A) daily. Pt son cooks breakfast everyday and checks on pt multiple times throughout the day. Prior Function Level of Independence: Independent with basic ADLs;Independent with homemaking with ambulation;Independent with gait;Independent with transfers Able to Take Stairs?: Yes Driving: Yes (drove 2 weeks ago) Vocation: Retired  Comments: Pt's son educated on disable [employer] car so that pt can not drive at this time. Pt demonstrates some visual deficits described below. ADL ADL Eating/Feeding: Simulated;Maximal assistance Eating/Feeding Details (indicate cue type and reason): Rt UE supported at elbow and utensil held into palm of hand pt is able to [facility] elbow flexion to mouth. Pt could benefit from universal cuff for self feeding with Rt UE and support AAROM Elbow flexion/extension Where Assessed - Eating/Feeding: Chair Lower Body Dressing: Performed;Maximal assistance Lower Body Dressing Details (indicate cue type and reason): don socks. Family in room reports pt doff socks independently in chair. Pt required Max (A) to cross LE and sock don partially. Pt able to pull sock from half don over heel to complete task on Rt LE only Where Assessed - Lower Body Dressing: Sitting, chair;Supported Ambulation Related to ADLs: Pt provided Max (A) with Rt side weakness ADL Comments: Pt requesting to d/c home and son plans to hire (A) for home. Pt's son educated that home health only visits 2-3 times per week for hour at most during one visit. Pt's son requesting that home health come every day and alternate disciplines. Pt will require Mod (A) for transfers at d/c. Vision/Perception  Vision - History Baseline Vision: Wears glasses only for reading Visual History: Other (comment);Corrective eye surgery (cataracts) Patient Visual Report: Peripheral vision impairment (pt compensating and turning head to locate objects) Vision - Assessment Eye Alignment: Within Functional  Limits Vision Assessment: Vision tested Tracking/Visual Pursuits: Decreased smoothness of eye movement to RIGHT superior field;Decreased smoothness of eye movement to LEFT superior field;Decreased smoothness of eye movement to RIGHT inferior field;Decreased smoothness of eye movement to LEFT inferior field;Requires cues, head turns, or add eye shifts to track;Impaired - to be further tested in functional context Saccades: Additional eye shifts occurred during testing;Additional head turns occurred during testing Convergence: Impaired (comment) Visual Fields: Left visual field deficit (pt compensating and following therapist arm.) Additional Comments: Pt poor return demo of visual testing. pt compensating and not following instructions. Family educated that visual deficits noted and though its clever that pt compensates it further demonstrates deficits exists. Pt's family to advised to terminate all driving for pt immediately until cleared by MD. Pt's son reports pt drove within last two weeks without family being aware that she had left home. Pt's son edcuated on disabling car by disconnecting the battery cables. Cognition Cognition Arousal/Alertness: Awake/alert Overall Cognitive Status: Impaired Attention: Impaired Current Attention Level: Selective Memory: Appears impaired Memory Deficits: repeating same statements continously. Pt no recall of previous answer. Orientation Level: Oriented to place;Oriented to person (not fully assessed due to RN and EEG entering room) Following Commands: Follows one step commands inconsistently;Follows one step commands with increased time Safety/Judgement: Decreased awareness of safety precautions Decreased Safety/Judgement: Impulsive Safety/Judgement - Other Comments: pt attempting sit<>Stand and states "i am going home" Awareness of Errors: Decreased awareness of errors made Decreased Awareness of Errors: Assistance required to correct errors  made Awareness of Deficits: Decreased awareness of deficits Problem Solving: Requires assistance for problem solving Sensation/Coordination Sensation Light Touch: Impaired Detail Light Touch Impaired Details: Impaired RUE Proprioception: Impaired Detail Proprioception Impaired Details: Impaired RUE Additional Comments: pt does not report hand as numb or tingling. Pt unable to provide any description of Rt UE. pt unable to state what digit is being touched, if digit is up or down, and states Rt UE feels different from LT UE Coordination Gross Motor Movements are Fluid and Coordinated: No Fine Motor Movements  are Fluid and Coordinated: No Extremity Assessment RUE Assessment RUE Assessment: Exceptions to [facility] RUE Strength Right Shoulder Flexion: 3-/5 Right Elbow Flexion: 3-/5 Right Elbow Extension: 3/5 Right Forearm Pronation: 2+/5 Right Forearm Supination: 2+/5 Gross Grasp: Impaired LUE Assessment LUE Assessment: Within Functional Limits Mobility  Bed Mobility Bed Mobility: Yes Sit to Supine: 3: Mod assist;HOB flat ((A) for BIL LE) Transfers Transfers: Yes Sit to Stand: With upper extremity assist;3: Mod assist;From chair/3-in-1 (Pt hand over hand to facilitate Rt UE weight bearing) Sit to Stand Details (indicate cue type and reason): Max v/c. Pt provided facilitaiton for hip flexion into upright posture Stand to Sit: 2: Max assist;With upper extremity assist;To chair/3-in-1 Stand to Sit Details: pt uncontrolled descend to chair and (A) to control. Exercises   End of Session OT - End of Session Equipment Utilized During Treatment: Gait belt Activity Tolerance: Treatment limited secondary to medical complications (Comment) (HR 140 with sit<>stand and x 4 steps. Pt with stable BP ) Patient left: in bed;with call bell in reach;Other (comment) (Rn staff present for EKG) Nurse Communication: Mobility status for transfers General Behavior During Session: Restless Cognition:  Impaired  Son with extensive questioning at the end of session. Pt's son educated that pt has cognition deficits, sensation changes in Rt UE, decreased balance requiring Mod (A) 24/7, should not be driving, and recommendation is CIR. Pt's son agreeable to [facility] only.   Harrel Carina [facility] 03/14/2011, 2:03 PM  Pager: [phone]

## 2011-03-14 NOTE — Consult Note (Signed)
Physical Medicine and Rehabilitation Consult Reason for Consult: Stroke Referring Phsyician: Dr. Leafy Ro Rolin is an 78 y.o. female.   HPI: 78 year old right-handed female admitted February 13 with right-sided weakness and slurred speech. MRI of the brain showed acute nonhemorrhagic scattered small infarcts involving portions of the left sub-insular region, left external capsule, posterior left corona radiata and left parietal lobe. 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 Carotid Dopplers with no significant internal carotid artery stenosis. Echocardiogram was pending. Neurology was consulted placed on Plavix /aspirin therapy. It was felt that infarcts most likely secondary to embolic source however not a Coumadin candidate due to fall risk. There is no current mention of plan for pituitary tumor identified for MRI. Speech therapy followup for dysphagia placed on dysphagia 2 nectar thick liquid diet. Subcutaneous Lovenox was added for deep vein thrombosis prophylaxis. Physical therapy evaluation completed with recommendations for inpatient rehabilitation services. Review of Systems  Respiratory: Positive for cough.   Musculoskeletal: Positive for myalgias and falls.  Neurological: Positive for dizziness and speech change.  All other systems reviewed and are negative.   Past Medical History  Diagnosis Date  . Arthritis   . Diabetes mellitus   . Hypertension   . Heart murmur    Past Surgical History  Procedure Date  . Abdominal hysterectomy    History reviewed. No pertinent family history. Social History:  reports that she has never smoked. She does not have any smokeless tobacco history on file. She reports that she does not drink alcohol or use illicit drugs. Allergies:  Allergies  Allergen Reactions  . Betadine  (Povidone Iodine) Hives  . Neosporin (Neomycin-Polymyx-Gramicid) Hives   Medications Prior to Admission  Medication Dose Route Frequency Provider Last Rate Last Dose  . acetaminophen (TYLENOL) tablet 650 mg  650 mg Oral Q4H PRN Vassie Loll, MD       Or  . acetaminophen (TYLENOL) suppository 650 mg  650 mg Rectal Q4H PRN Vassie Loll, MD      . amLODipine (NORVASC) tablet 5 mg  5 mg Oral Daily Vassie Loll, MD   5 mg at 03/14/11 1121  . aspirin chewable tablet 324 mg  324 mg Oral Once Peter A. Patrica Duel, MD   324 mg at 03/13/11 0906  . clopidogrel (PLAVIX) tablet 75 mg  75 mg Oral Q breakfast Vassie Loll, MD   75 mg at 03/14/11 0820  . enoxaparin (LOVENOX) injection 40 mg  40 mg Subcutaneous Q24H Vassie Loll, MD   40 mg at 03/14/11 1343  . food thickener (RESOURCE THICKENUP CLEAR) powder   Oral PRN Annie Main, NP      . hydrALAZINE (APRESOLINE) injection 10 mg  10 mg Intravenous Q8H PRN Vassie Loll, MD   10 mg at 03/14/11 0604  . insulin aspart (novoLOG) injection 0-5 Units  0-5 Units Subcutaneous QHS Vassie Loll, MD      . insulin aspart (novoLOG) injection 0-9 Units  0-9 Units Subcutaneous TID WC Vassie Loll, MD   3 Units at 03/14/11 1333  . lisinopril (PRINIVIL,ZESTRIL) tablet 40 mg  40 mg Oral Daily Vassie Loll, MD   40 mg at 03/14/11 1121  . LORazepam (ATIVAN) injection 1 mg  1 mg Intravenous Once Vassie Loll, MD   1 mg at 03/13/11 1307  . LORazepam (ATIVAN) injection 1 mg  1 mg Intravenous  Once Vassie Loll, MD      . LORazepam (ATIVAN) injection 1 mg  1 mg Intravenous Once Vassie Loll, MD      . magnesium sulfate IVPB 1 g 100 mL  1 g Intravenous Once Vassie Loll, MD   1 g at 03/14/11 1344  . ondansetron (ZOFRAN) injection 4 mg  4 mg Intravenous Q6H PRN Vassie Loll, MD      . senna-docusate (Senokot-S) tablet 1 tablet  1 tablet Oral QHS PRN Vassie Loll, MD      . sodium chloride 0.9 % 1,000 mL with thiamine 100 mg, folic acid 1 mg, multivitamins adult 10 mL  infusion   Intravenous Once Vassie Loll, MD      . DISCONTD: aspirin EC tablet 81 mg  81 mg Oral Daily Vassie Loll, MD   81 mg at 03/13/11 2000  . DISCONTD: aspirin suppository 300 mg  300 mg Rectal Daily Vassie Loll, MD      . DISCONTD: aspirin tablet 325 mg  325 mg Oral Daily Vassie Loll, MD      . DISCONTD: enoxaparin (LOVENOX) injection 40 mg  40 mg Subcutaneous Q24H Vassie Loll, MD      . DISCONTD: food thickener (THICK IT) powder   Oral PRN Annie Main, NP      . DISCONTD: labetalol (NORMODYNE,TRANDATE) injection 10 mg  10 mg Intravenous Once Peter A. Patrica Duel, MD      . DISCONTD: magnesium sulfate IVPB 2 g 50 mL  2 g Intravenous Once Vassie Loll, MD       Medications Prior to Admission  Medication Sig Dispense Refill  . acetaminophen (TYLENOL) 500 MG tablet Take 500 mg by mouth every 6 (six) hours as needed. For pain      . aspirin EC 81 MG tablet Take 81 mg by mouth daily.       Marland Kitchen lisinopril (PRINIVIL,ZESTRIL) 10 MG tablet Take 10 mg by mouth daily.         Home: Home Living Lives With: Alone Receives Help From: Family Type of Home: House Home Layout: One level Home Access: Stairs to enter;Ramped entrance (ramp in back) Entrance Stairs-Rails: Can reach both Entrance Stairs-Number of Steps: 1 Bathroom Shower/Tub: Health visitor: Handicapped height Bathroom Accessibility: Yes How Accessible: Accessible via walker Home Adaptive Equipment: Walker - rolling;Quad cane;Grab bars around toilet;Grab bars in shower  Functional History: Prior Function Level of Independence: Independent with basic ADLs;Independent with homemaking with ambulation;Independent with gait;Independent with transfers Able to Take Stairs?: Yes Driving: Yes Vocation: Retired Functional Status:  Mobility: Bed Mobility Bed Mobility: No (pt sitting in chair upon arrival) Transfers Transfers: Yes Sit to Stand: 3: Mod assist;With upper extremity assist;From chair/3-in-1 Sit to Stand  Details (indicate cue type and reason): VC Stand to Sit: 3: Mod assist;With upper extremity assist;To chair/3-in-1 Stand to Sit Details: VC for hand placement for safety. Assist to control descent into chair with tactile cues through pelvis Ambulation/Gait Ambulation/Gait: Yes Ambulation/Gait Assistance: 3: Mod assist Ambulation/Gait Assistance Details (indicate cue type and reason): Mod assist for ambulation sceondary to safety and stabilization as well as assist to control RW. Pt unable to use RUE to control RW, therefore was drifting to the right. Will attempt hand held ambulation next session. VC for technique and sequencing Ambulation Distance (Feet): 20 Feet (distance limited by HR) Assistive device: Rolling walker Gait Pattern: Step-to pattern;Decreased step length - left;Decreased stance time - right;Decreased hip/knee flexion - right;Decreased dorsiflexion - right Gait velocity: Decreased gait  speed Stairs: No    ADL:    Cognition: Cognition Overall Cognitive Status: Appears within functional limits for tasks assessed Arousal/Alertness: Awake/alert Orientation Level: Oriented X4 Cognition Arousal/Alertness: Awake/alert Overall Cognitive Status: Impaired Orientation Level: Oriented X4 Safety/Judgement: Decreased awareness of safety precautions Decreased Safety/Judgement: Impulsive Awareness of Deficits: Decreased awareness of deficits  Blood pressure 124/78, pulse 105, temperature 97.7 F (36.5 C), temperature source Oral, resp. rate 20, height 5\' 2"  (1.575 m), weight 61.78 kg (136 lb 3.2 oz), SpO2 94.00%. Physical Exam  Vitals reviewed. HENT:  Head: Normocephalic.  Neck: Normal range of motion. Neck supple. No thyromegaly present.  Cardiovascular: Normal rate and regular rhythm.   Pulmonary/Chest: Breath sounds normal. She has no wheezes.  Abdominal: She exhibits no distension. There is no tenderness.  Musculoskeletal: She exhibits no edema.  Neurological: She is  alert.       Restless, anxious, and easily distracted.. follow simple one-step commands. Speech is dysarthric but mostly intelligible. She did have decreased awareness of her deficits.  Skin: Skin is warm and dry.   heart grade 3/6 systolic ejection murmur Motor strength is 0/5 in the right upper extremity and 3 minus in the right lower remedy. Sensation intact to light touch in bilateral upper and lower extremity Orientation to person and place however she is very lethargic now that she receives medication for agitation C. has evidence of dysarthria she has a partial lower facial palsy she has no evidence of visual spatial neglect. No evidence of aphasia.  Results for orders placed during the hospital encounter of 03/13/11 (from the past 24 hour(s))  URINALYSIS, ROUTINE W REFLEX MICROSCOPIC     Status: Abnormal   Collection Time   03/13/11  4:32 PM      Component Value Range   Color, Urine YELLOW  YELLOW    APPearance CLEAR  CLEAR    Specific Gravity, Urine 1.008  1.005 - 1.030    pH 7.5  5.0 - 8.0    Glucose, UA NEGATIVE  NEGATIVE (mg/dL)   Hgb urine dipstick TRACE (*) NEGATIVE    Bilirubin Urine NEGATIVE  NEGATIVE    Ketones, ur NEGATIVE  NEGATIVE (mg/dL)   Protein, ur NEGATIVE  NEGATIVE (mg/dL)   Urobilinogen, UA 0.2  0.0 - 1.0 (mg/dL)   Nitrite NEGATIVE  NEGATIVE    Leukocytes, UA NEGATIVE  NEGATIVE   URINE MICROSCOPIC-ADD ON     Status: Normal   Collection Time   03/13/11  4:32 PM      Component Value Range   Squamous Epithelial / LPF RARE  RARE    WBC, UA 0-2  <3 (WBC/hpf)   RBC / HPF 0-2  <3 (RBC/hpf)  GLUCOSE, CAPILLARY     Status: Abnormal   Collection Time   03/13/11 11:29 PM      Component Value Range   Glucose-Capillary 130 (*) 70 - 99 (mg/dL)  GLUCOSE, CAPILLARY     Status: Abnormal   Collection Time   03/14/11  6:55 AM      Component Value Range   Glucose-Capillary 139 (*) 70 - 99 (mg/dL)  HEMOGLOBIN Z6X     Status: Abnormal   Collection Time   03/14/11  7:05 AM       Component Value Range   Hemoglobin A1C 6.7 (*) <5.7 (%)   Mean Plasma Glucose 146 (*) <117 (mg/dL)  CBC     Status: Abnormal   Collection Time   03/14/11  7:05 AM      Component  Value Range   WBC 16.2 (*) 4.0 - 10.5 (K/uL)   RBC 4.90  3.87 - 5.11 (MIL/uL)   Hemoglobin 15.2 (*) 12.0 - 15.0 (g/dL)   HCT 78.2  95.6 - 21.3 (%)   MCV 89.2  78.0 - 100.0 (fL)   MCH 31.0  26.0 - 34.0 (pg)   MCHC 34.8  30.0 - 36.0 (g/dL)   RDW 08.6  57.8 - 46.9 (%)   Platelets 314  150 - 400 (K/uL)  BASIC METABOLIC PANEL     Status: Abnormal   Collection Time   03/14/11  7:05 AM      Component Value Range   Sodium 137  135 - 145 (mEq/L)   Potassium 4.0  3.5 - 5.1 (mEq/L)   Chloride 101  96 - 112 (mEq/L)   CO2 20  19 - 32 (mEq/L)   Glucose, Bld 128 (*) 70 - 99 (mg/dL)   BUN 13  6 - 23 (mg/dL)   Creatinine, Ser 6.29  0.50 - 1.10 (mg/dL)   Calcium 52.8  8.4 - 10.5 (mg/dL)   GFR calc non Af Amer 51 (*) >90 (mL/min)   GFR calc Af Amer 59 (*) >90 (mL/min)  LIPID PANEL     Status: Abnormal   Collection Time   03/14/11  7:05 AM      Component Value Range   Cholesterol 191  0 - 200 (mg/dL)   Triglycerides 413  <244 (mg/dL)   HDL 50  >01 (mg/dL)   Total CHOL/HDL Ratio 3.8     VLDL 23  0 - 40 (mg/dL)   LDL Cholesterol 027 (*) 0 - 99 (mg/dL)   Dg Chest 2 View  2/53/6644  *RADIOLOGY REPORT*  Clinical Data: Stroke  CHEST - 2 VIEW  Comparison: Earlier film of the same day  Findings: Coarse attenuated bronchovascular markings diffusely.  No focal infiltrate.  No effusion.  Heart size upper limits normal. Tortuous atheromatous thoracic aorta.  Regional bones unremarkable.  IMPRESSION:  1.  Borderline cardiomegaly and chronic changes.  No acute disease.  Original Report Authenticated By: Osa Craver, M.D.   Dg Chest 2 View  03/13/2011  *RADIOLOGY REPORT*  Clinical Data: Leg weakness and slurred speech.  Right-sided weakness.  CHEST - 2 VIEW  Comparison: 10/18/2007.  Findings: Trachea is midline.   Heart size stable. Mild interstitial prominence appears unchanged from 10/18/2007.  Biapical pleural parenchymal scarring.  No acute airspace consolidation.  No pleural fluid.  IMPRESSION: No acute findings.  Original Report Authenticated By: Reyes Ivan, M.D.   Ct Head Wo Contrast  03/13/2011  *RADIOLOGY REPORT*  Clinical Data: Weakness.  Possible slurred speech.  History of hypertension.  History of diabetes.  CT HEAD WITHOUT CONTRAST  Technique:  Contiguous axial images were obtained from the base of the skull through the vertex without contrast.  Comparison: Most recent CT head 06/29/2010.  Findings: There is no evidence for acute infarction, intracranial hemorrhage, mass lesion, hydrocephalus, or extra-axial fluid. Age appropriate atrophy is present (78 years old.  Chronic microvascular ischemic change is present in the periventricular and subcortical white matter.  The sella may be mildly expanded, extending focally downward on the right into the sphenoid sinus.  A small pituitary adenoma could cause this remodeling, or perhaps empty sella although less likely. No suprasellar extension.  If clinically indicated, MRI without and with contrast with attention to the pituitary could be helpful in further evaluation.  Bilateral chronic basal ganglia lacunar infarcts are present,  slightly larger on the left.  No visible acute cortical infarct. The calvarium is intact.  There is moderately advanced vascular calcification, particularly in the proximal basilar and distal left vertebral.   There is a large sebaceous cyst in the left parietal scalp. There is no acute sinus or mastoid disease.  IMPRESSION: No acute stroke or bleed.  Atrophy and small vessel disease is similar to priors.  The sella may be focally expanded inferiorly on the right. Pituitary adenoma not excluded.  Original Report Authenticated By: Elsie Stain, M.D.   Mr Brain Wo Contrast  03/13/2011  *RADIOLOGY REPORT*  Clinical Data:  Right-sided weakness with slurred speech.  MRI HEAD WITHOUT CONTRAST  Technique:  Multiplanar, multiecho pulse sequences of the brain and surrounding structures were obtained according to standard protocol without intravenous contrast.  Comparison: 03/13/2011 head CT.  No comparison brain MR.  Findings: The patient was only able to complete three sequences.  Acute non hemorrhagic scattered small infarcts involving portions of the left sub insular region, left external capsule, posterior left corona radiata and left parietal lobe.  No obvious intracranial hemorrhage.  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  Remote basal ganglia/mid corona radiata infarct.  Moderate small vessel disease type changes.  Global atrophy without hydrocephalus.  Major intracranial vascular structures appear patent.  IMPRESSION: The patient was only able to complete three sequences.  Acute non hemorrhagic scattered small infarcts involving portions of the left sub insular region, left external capsule, posterior left corona radiata and left parietal lobe.  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  Remote basal ganglia/mid corona radiata infarct.  Moderate small vessel disease type changes.  Global atrophy without hydrocephalus.  Original Report Authenticated By: Fuller Canada, M.D.    Assessment/Plan: Diagnosis: Left subcortical infarct with right upper extremity greater than right lower extremity weakness. Also with dysarthria and swallowing difficulties 1. Does the need for close, 24 hr/day medical supervision in concert with the patient's rehab needs make it unreasonable for this patient to be served in a less intensive setting? Yes 2. Co-Morbidities requiring supervision/potential complications: Hypertension urinary incontinence 3. Due to bladder management, safety, skin/wound  care, disease management, medication administration and patient education, does the patient require 24 hr/day rehab nursing? Yes 4. Does the patient require coordinated care of a physician, rehab nurse, PT (1-2 hrs/day, 5 days/week), OT (1-2 hrs/day, 5 days/week) and SLP (0.5-1 hrs/day, 5 days/week) to address physical and functional deficits in the context of the above medical diagnosis(es)? Yes Addressing deficits in the following areas: balance, endurance, locomotion, strength, transferring, bowel/bladder control, bathing, dressing and toileting 5. Can the patient actively participate in an intensive therapy program of at least 3 hrs of therapy per day at least 5 days per week? Potentially 6. The potential for patient to make measurable gains while on inpatient rehab is good 7. Anticipated functional outcomes upon discharge from inpatients are supervision ability supervision ADLs PT, supervision ADLs increased right upper extremity strength OT, improved cognition to full orientation and alertness SLP 8. Estimated rehab length of stay to reach the above functional goals is: 10 days 9. Does the patient have adequate social supports to accommodate these discharge functional goals? Potentially 10. Anticipated D/C setting: Home 11. Anticipated post D/C treatments: HH therapy 12. Overall Rehab/Functional Prognosis: fair  RECOMMENDATIONS: This patient's condition is appropriate for continued rehabilitative care in the  following setting: CIR Patient has agreed to participate in recommended program. No Note that insurance prior authorization may be required for reimbursement for recommended care.  Comment: Patient wants to leave the hospital reluctant to commit to inpatient rehabilitation. Family plans to hire a caregiver at home   ANGIULLI,DANIEL J. 03/14/2011

## 2011-03-14 NOTE — Progress Notes (Signed)
Subjective: Tachycardic; anxious and wanting to go home.  Objective: Vital signs in last 24 hours: Temp:  [97.7 F (36.5 C)-98.6 F (37 C)] 97.8 F (36.6 C) (02/14 1429) Pulse Rate:  [92-126] 126  (02/14 1429) Resp:  [20] 20  (02/14 1429) BP: (124-176)/(78-107) 146/99 mmHg (02/14 1429) SpO2:  [92 %-96 %] 96 % (02/14 1429) Weight:  [61.78 kg (136 lb 3.2 oz)] 61.78 kg (136 lb 3.2 oz) (02/13 2000) Weight change:  Last BM Date: 03/12/11  Intake/Output from previous day:       Physical Exam: General: Alert, awake, oriented x2. HEENT: No bruits, no goiter. Heart: S1 and S2 Lungs: Clear to auscultation bilaterally. Abdomen: Soft, nontender, nondistended, positive bowel sounds. Extremities: Multiple bruises; no clubbing and trace edema; also with decrease pedal pulses bilaterally. Neuro: sable; no new deficit.  Lab Results: Basic Metabolic Panel:  Basename 03/14/11 0705 03/13/11 0820  NA 137 135  K 4.0 4.6  CL 101 102  CO2 20 23  GLUCOSE 128* 138*  BUN 13 14  CREATININE 0.96 0.90  CALCIUM 10.1 9.7  MG -- --  PHOS -- --   Liver Function Tests:  [facility] 03/13/11 0820  AST 25  ALT 21  ALKPHOS 88  BILITOT 0.3  PROT 7.2  ALBUMIN 3.7   CBC:  Basename 03/14/11 0705 03/13/11 1306 03/13/11 0820  WBC 16.2* 12.8* --  NEUTROABS -- -- 8.3*  HGB 15.2* 14.4 --  HCT 43.7 41.8 --  MCV 89.2 89.7 --  PLT 314 259 --   Cardiac Enzymes:  Basename 03/13/11 0820  CKTOTAL 90  CKMB 4.1*  CKMBINDEX --  TROPONINI <0.30   CBG:  Basename 03/14/11 1544 03/14/11 1241 03/14/11 0655 03/13/11 2329 03/13/11 0826  GLUCAP 144* 222* 139* 130* 144*   Hemoglobin A1C:  Basename 03/14/11 0705  HGBA1C 6.7*   Fasting Lipid Panel:  Basename 03/14/11 0705  CHOL 191  HDL 50  LDLCALC 118*  TRIG 114  CHOLHDL 3.8  LDLDIRECT --   Coagulation:  Basename 03/13/11 0820  LABPROT 14.0  INR 1.06   Urinalysis:  Basename 03/13/11 1632  COLORURINE YELLOW  LABSPEC 1.008  PHURINE  7.5  GLUCOSEU NEGATIVE  HGBUR TRACE*  BILIRUBINUR NEGATIVE  KETONESUR NEGATIVE  PROTEINUR NEGATIVE  UROBILINOGEN 0.2  NITRITE NEGATIVE  LEUKOCYTESUR NEGATIVE    Studies/Results: Dg Chest 2 View  03/13/2011  *RADIOLOGY REPORT*  Clinical Data: Stroke  CHEST - 2 VIEW  Comparison: Earlier film of the same day  Findings: Coarse attenuated bronchovascular markings diffusely.  No focal infiltrate.  No effusion.  Heart size upper limits normal. Tortuous atheromatous thoracic aorta.  Regional bones unremarkable.  IMPRESSION:  1.  Borderline cardiomegaly and chronic changes.  No acute disease.  Original Report Authenticated By: Osa Craver, M.D.   Dg Chest 2 View  03/13/2011  *RADIOLOGY REPORT*  Clinical Data: Leg weakness and slurred speech.  Right-sided weakness.  CHEST - 2 VIEW  Comparison: 10/18/2007.  Findings: Trachea is midline.  Heart size stable. Mild interstitial prominence appears unchanged from 10/18/2007.  Biapical pleural parenchymal scarring.  No acute airspace consolidation.  No pleural fluid.  IMPRESSION: No acute findings.  Original Report Authenticated By: Reyes Ivan, M.D.   Ct Head Wo Contrast  03/13/2011  *RADIOLOGY REPORT*  Clinical Data: Weakness.  Possible slurred speech.  History of hypertension.  History of diabetes.  CT HEAD WITHOUT CONTRAST  Technique:  Contiguous axial images were obtained from the base of the skull through the  vertex without contrast.  Comparison: Most recent CT head 06/29/2010.  Findings: There is no evidence for acute infarction, intracranial hemorrhage, mass lesion, hydrocephalus, or extra-axial fluid. Age appropriate atrophy is present (78 years old.  Chronic microvascular ischemic change is present in the periventricular and subcortical white matter.  The sella may be mildly expanded, extending focally downward on the right into the sphenoid sinus.  A small pituitary adenoma could cause this remodeling, or perhaps empty sella although less  likely. No suprasellar extension.  If clinically indicated, MRI without and with contrast with attention to the pituitary could be helpful in further evaluation.  Bilateral chronic basal ganglia lacunar infarcts are present, slightly larger on the left.  No visible acute cortical infarct. The calvarium is intact.  There is moderately advanced vascular calcification, particularly in the proximal basilar and distal left vertebral.   There is a large sebaceous cyst in the left parietal scalp. There is no acute sinus or mastoid disease.  IMPRESSION: No acute stroke or bleed.  Atrophy and small vessel disease is similar to priors.  The sella may be focally expanded inferiorly on the right. Pituitary adenoma not excluded.  Original Report Authenticated By: Elsie Stain, M.D.   Mr Brain Wo Contrast  03/13/2011  *RADIOLOGY REPORT*  Clinical Data: Right-sided weakness with slurred speech.  MRI HEAD WITHOUT CONTRAST  Technique:  Multiplanar, multiecho pulse sequences of the brain and surrounding structures were obtained according to standard protocol without intravenous contrast.  Comparison: 03/13/2011 head CT.  No comparison brain MR.  Findings: The patient was only able to complete three sequences.  Acute non hemorrhagic scattered small infarcts involving portions of the left sub insular region, left external capsule, posterior left corona radiata and left parietal lobe.  No obvious intracranial hemorrhage.  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  Remote basal ganglia/mid corona radiata infarct.  Moderate small vessel disease type changes.  Global atrophy without hydrocephalus.  Major intracranial vascular structures appear patent.  IMPRESSION: The patient was only able to complete three sequences.  Acute non hemorrhagic scattered small infarcts involving portions of the left sub insular region, left external capsule, posterior left corona  radiata and left parietal lobe.  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  Remote basal ganglia/mid corona radiata infarct.  Moderate small vessel disease type changes.  Global atrophy without hydrocephalus.  Original Report Authenticated By: Fuller Canada, M.D.    Medications: Scheduled Meds:    . carvedilol  12.5 mg Oral BID WC  . clopidogrel  75 mg Oral Q breakfast  . enoxaparin  40 mg Subcutaneous Q24H  . insulin aspart  0-5 Units Subcutaneous QHS  . insulin aspart  0-9 Units Subcutaneous TID WC  . lisinopril  40 mg Oral Daily  . LORazepam  1 mg Intravenous Once  . LORazepam  1 mg Intravenous Once  . magnesium sulfate 1 - 4 g bolus IVPB  1 g Intravenous Once  . simvastatin  20 mg Oral q1800  . general admission iv infusion   Intravenous Once  . DISCONTD: amLODipine  5 mg Oral Daily  . DISCONTD: aspirin EC  81 mg Oral Daily  . DISCONTD: magnesium sulfate 1 - 4 g bolus IVPB  2 g Intravenous Once   Continuous Infusions:  PRN Meds:.acetaminophen, acetaminophen, food thickener, hydrALAZINE, ondansetron (ZOFRAN) IV, senna-docusate, zolpidem, DISCONTD: food thickener  Assessment/Plan:  1-Left brain stroke with right hemiparesis: Finish stroke work up; secondary prevention with plavix; will control BP, continue tx for diabetes and start statins. PT/OT recommending inpatient rehab. Appreciate neurology assistance and recommendations.  2-HTN (hypertension): much better; will continue lisinopril and will start coreg.  3-DM2 (diabetes mellitus, type 2): continue SSI; with A1C demonstrating good control; will resume metformin at discharge.  4-Insomnia: PRN zolpidem  5-Hemiparesis, right: due to stroke; work up and treatment as specified above.  6-HLD (hyperlipidemia): start zocor  7-Dysphagia:Per ST; dyspagia 2 with nectar thick liquids.    LOS: 1 day   Clemente Dewey [facility]ist [phone]  03/14/2011, 5:43 PM

## 2011-03-15 ENCOUNTER — Inpatient Hospital Stay (HOSPITAL_COMMUNITY): Payer: Medicare Other

## 2011-03-15 ENCOUNTER — Inpatient Hospital Stay (HOSPITAL_COMMUNITY)
Admission: RE | Admit: 2011-03-15 | Discharge: 2011-03-26 | DRG: 945 | Disposition: A | Discharge status: Home Health | Payer: Medicare Other | Source: Ambulatory Visit | Attending: Physical Medicine & Rehabilitation | Admitting: Physical Medicine & Rehabilitation

## 2011-03-15 DIAGNOSIS — N289 Disorder of kidney and ureter, unspecified: Secondary | ICD-10-CM

## 2011-03-15 DIAGNOSIS — A0472 Enterocolitis due to Clostridium difficile, not specified as recurrent: Secondary | ICD-10-CM | POA: Diagnosis not present

## 2011-03-15 DIAGNOSIS — Z5189 Encounter for other specified aftercare: Principal | ICD-10-CM

## 2011-03-15 DIAGNOSIS — L97809 Non-pressure chronic ulcer of other part of unspecified lower leg with unspecified severity: Secondary | ICD-10-CM | POA: Diagnosis not present

## 2011-03-15 DIAGNOSIS — I6350 Cerebral infarction due to unspecified occlusion or stenosis of unspecified cerebral artery: Secondary | ICD-10-CM

## 2011-03-15 DIAGNOSIS — Z131 Encounter for screening for diabetes mellitus: Secondary | ICD-10-CM

## 2011-03-15 DIAGNOSIS — Z7902 Long term (current) use of antithrombotics/antiplatelets: Secondary | ICD-10-CM | POA: Diagnosis not present

## 2011-03-15 DIAGNOSIS — G8110 Spastic hemiplegia affecting unspecified side: Secondary | ICD-10-CM | POA: Diagnosis not present

## 2011-03-15 DIAGNOSIS — F329 Major depressive disorder, single episode, unspecified: Secondary | ICD-10-CM

## 2011-03-15 DIAGNOSIS — E785 Hyperlipidemia, unspecified: Secondary | ICD-10-CM

## 2011-03-15 DIAGNOSIS — E119 Type 2 diabetes mellitus without complications: Secondary | ICD-10-CM | POA: Diagnosis not present

## 2011-03-15 DIAGNOSIS — R471 Dysarthria and anarthria: Secondary | ICD-10-CM | POA: Diagnosis not present

## 2011-03-15 DIAGNOSIS — I872 Venous insufficiency (chronic) (peripheral): Secondary | ICD-10-CM | POA: Diagnosis not present

## 2011-03-15 DIAGNOSIS — Z8673 Personal history of transient ischemic attack (TIA), and cerebral infarction without residual deficits: Secondary | ICD-10-CM | POA: Diagnosis present

## 2011-03-15 DIAGNOSIS — D72829 Elevated white blood cell count, unspecified: Secondary | ICD-10-CM | POA: Diagnosis not present

## 2011-03-15 DIAGNOSIS — L97509 Non-pressure chronic ulcer of other part of unspecified foot with unspecified severity: Secondary | ICD-10-CM | POA: Diagnosis not present

## 2011-03-15 DIAGNOSIS — R1310 Dysphagia, unspecified: Secondary | ICD-10-CM

## 2011-03-15 DIAGNOSIS — Z79899 Other long term (current) drug therapy: Secondary | ICD-10-CM

## 2011-03-15 DIAGNOSIS — R220 Localized swelling, mass and lump, head: Secondary | ICD-10-CM

## 2011-03-15 DIAGNOSIS — H9190 Unspecified hearing loss, unspecified ear: Secondary | ICD-10-CM | POA: Diagnosis not present

## 2011-03-15 DIAGNOSIS — F3289 Other specified depressive episodes: Secondary | ICD-10-CM

## 2011-03-15 DIAGNOSIS — I359 Nonrheumatic aortic valve disorder, unspecified: Secondary | ICD-10-CM

## 2011-03-15 DIAGNOSIS — I10 Essential (primary) hypertension: Secondary | ICD-10-CM | POA: Diagnosis present

## 2011-03-15 DIAGNOSIS — G8190 Hemiplegia, unspecified affecting unspecified side: Secondary | ICD-10-CM | POA: Diagnosis not present

## 2011-03-15 DIAGNOSIS — L98499 Non-pressure chronic ulcer of skin of other sites with unspecified severity: Secondary | ICD-10-CM

## 2011-03-15 DIAGNOSIS — G8191 Hemiplegia, unspecified affecting right dominant side: Secondary | ICD-10-CM | POA: Diagnosis present

## 2011-03-15 DIAGNOSIS — R221 Localized swelling, mass and lump, neck: Secondary | ICD-10-CM | POA: Diagnosis not present

## 2011-03-15 DIAGNOSIS — I739 Peripheral vascular disease, unspecified: Secondary | ICD-10-CM

## 2011-03-15 DIAGNOSIS — I6340 Cerebral infarction due to embolism of unspecified cerebral artery: Secondary | ICD-10-CM

## 2011-03-15 LAB — GLUCOSE, CAPILLARY
Glucose-Capillary: 156 mg/dL — ABNORMAL HIGH (ref 70–99)
Glucose-Capillary: 131 mg/dL — ABNORMAL HIGH (ref 70–99)
Glucose-Capillary: 229 mg/dL — ABNORMAL HIGH (ref 70–99)
Glucose-Capillary: 169 mg/dL — ABNORMAL HIGH (ref 70–99)

## 2011-03-15 LAB — MRSA PCR SCREENING: MRSA by PCR: NEGATIVE

## 2011-03-15 MED ORDER — FOOD THICKENER (THICKENUP CLEAR)
ORAL | Status: DC | PRN
  Filled 2011-03-15: qty 120

## 2011-03-15 MED ORDER — LISINOPRIL 20 MG PO TABS: 20.0000 mg | ORAL_TABLET | Freq: Every day | ORAL | Status: DC

## 2011-03-15 MED ORDER — INSULIN ASPART 100 UNIT/ML ~~LOC~~ SOLN
0.0000 [IU] | Freq: Every day | SUBCUTANEOUS | Status: DC
  Filled 2011-03-15: qty 3

## 2011-03-15 MED ORDER — METFORMIN HCL 500 MG PO TABS
500.0000 mg | ORAL_TABLET | Freq: Two times a day (BID) | ORAL | Status: DC
  Administered 2011-03-15 – 2011-03-21 (×13): 500 mg via ORAL
  Filled 2011-03-15 (×16): qty 1

## 2011-03-15 MED ORDER — SODIUM CHLORIDE 0.9 % IV BOLUS (SEPSIS): 500.0000 mL | Freq: Once | INTRAVENOUS | Status: DC

## 2011-03-15 MED ORDER — SODIUM CHLORIDE 0.9 % IV BOLUS (SEPSIS)
500.0000 mL | Freq: Once | INTRAVENOUS | Status: AC
  Administered 2011-03-15: 500 mL via INTRAVENOUS

## 2011-03-15 MED ORDER — SIMVASTATIN 20 MG PO TABS
20.0000 mg | ORAL_TABLET | Freq: Every day | ORAL | Status: DC
  Administered 2011-03-15 – 2011-03-25 (×11): 20 mg via ORAL
  Filled 2011-03-15 (×12): qty 1

## 2011-03-15 MED ORDER — CARVEDILOL 12.5 MG PO TABS
12.5000 mg | ORAL_TABLET | Freq: Two times a day (BID) | ORAL | Status: DC
  Administered 2011-03-15 – 2011-03-21 (×13): 12.5 mg via ORAL
  Filled 2011-03-15 (×18): qty 1

## 2011-03-15 MED ORDER — INSULIN ASPART 100 UNIT/ML ~~LOC~~ SOLN
0.0000 [IU] | Freq: Three times a day (TID) | SUBCUTANEOUS | Status: DC
  Administered 2011-03-15 – 2011-03-16 (×2): 2 [IU] via SUBCUTANEOUS
  Administered 2011-03-16: 1 [IU] via SUBCUTANEOUS
  Administered 2011-03-16 – 2011-03-17 (×2): 2 [IU] via SUBCUTANEOUS
  Administered 2011-03-17: 1 [IU] via SUBCUTANEOUS
  Administered 2011-03-17: 2 [IU] via SUBCUTANEOUS
  Administered 2011-03-18: 1 [IU] via SUBCUTANEOUS
  Administered 2011-03-19 – 2011-03-21 (×5): 2 [IU] via SUBCUTANEOUS
  Administered 2011-03-22 (×2): 3 [IU] via SUBCUTANEOUS
  Administered 2011-03-22: 1 [IU] via SUBCUTANEOUS
  Administered 2011-03-23: 2 [IU] via SUBCUTANEOUS
  Administered 2011-03-23 (×2): 1 [IU] via SUBCUTANEOUS
  Administered 2011-03-24 – 2011-03-26 (×5): 2 [IU] via SUBCUTANEOUS
  Filled 2011-03-15: qty 3

## 2011-03-15 MED ORDER — CLOPIDOGREL BISULFATE 75 MG PO TABS
75.0000 mg | ORAL_TABLET | Freq: Every day | ORAL | Status: DC
  Administered 2011-03-16 – 2011-03-26 (×11): 75 mg via ORAL
  Filled 2011-03-15 (×12): qty 1

## 2011-03-15 MED ORDER — LISINOPRIL 40 MG PO TABS
40.0000 mg | ORAL_TABLET | Freq: Every day | ORAL | Status: DC
  Administered 2011-03-16: 40 mg via ORAL
  Filled 2011-03-15 (×3): qty 1

## 2011-03-15 MED ORDER — CLONIDINE HCL 0.1 MG PO TABS
0.1000 mg | ORAL_TABLET | Freq: Four times a day (QID) | ORAL | Status: DC | PRN
  Filled 2011-03-15: qty 1

## 2011-03-15 NOTE — Discharge Summary (Signed)
Physician Discharge Summary  Patient ID: Christina Pittman MRN: [id number] DOB/AGE: [birth year] 78 y.o.  Admit date: 03/13/2011 Discharge date: 03/15/2011  Primary Care Physician:  Provider Not In System   Discharge Diagnoses:   1-Left brain Stroke 2-HTN (hypertension) 3-DM2 (diabetes mellitus, type 2) 4-Insomnia 5-Hemiparesis, right 6-HLD (hyperlipidemia) 7-Dysphagia 8-Aortic stenosis 9-Moderate LVH     . carvedilol  12.5 mg Oral BID WC  . clopidogrel  75 mg Oral Q breakfast  . enoxaparin  40 mg Subcutaneous Q24H  . insulin aspart  0-5 Units Subcutaneous QHS  . insulin aspart  0-9 Units Subcutaneous TID WC  . lisinopril  20 mg Oral Daily  . LORazepam  1 mg Intravenous Once  . simvastatin  20 mg Oral q1800  . general admission iv infusion   Intravenous Once  . sodium chloride  500 mL Intravenous Once  . DISCONTD: amLODipine  5 mg Oral Daily  . DISCONTD: lisinopril  40 mg Oral Daily     Disposition and Follow-up:  Patient in stable condition now and ready for inpatient rehab. Patient will now use plavix for secondary prevention and will also use zocor as statins for HLD. For BP control she has been started on coreg 12.5 BID and lisinopril increased to 20mg  daily. Further medication adjustments as needed depending on Patient response and evolution.  Consults:   Neurology Inpatient rehab   Significant Diagnostic Studies:  Dg Chest 2 View  03/13/2011  *RADIOLOGY REPORT*  Clinical Data: Stroke  CHEST - 2 VIEW  Comparison: Earlier film of the same day  Findings: Coarse attenuated bronchovascular markings diffusely.  No focal infiltrate.  No effusion.  Heart size upper limits normal. Tortuous atheromatous thoracic aorta.  Regional bones unremarkable.  IMPRESSION:  1.  Borderline cardiomegaly and chronic changes.  No acute disease.  Original Report Authenticated By: Osa Craver, M.D.   Dg Chest 2 View  03/13/2011  *RADIOLOGY REPORT*  Clinical Data: Leg weakness and slurred  speech.  Right-sided weakness.  CHEST - 2 VIEW  Comparison: 10/18/2007.  Findings: Trachea is midline.  Heart size stable. Mild interstitial prominence appears unchanged from 10/18/2007.  Biapical pleural parenchymal scarring.  No acute airspace consolidation.  No pleural fluid.  IMPRESSION: No acute findings.  Original Report Authenticated By: Reyes Ivan, M.D.   Ct Head Wo Contrast  03/13/2011  *RADIOLOGY REPORT*  Clinical Data: Weakness.  Possible slurred speech.  History of hypertension.  History of diabetes.  CT HEAD WITHOUT CONTRAST  Technique:  Contiguous axial images were obtained from the base of the skull through the vertex without contrast.  Comparison: Most recent CT head 06/29/2010.  Findings: There is no evidence for acute infarction, intracranial hemorrhage, mass lesion, hydrocephalus, or extra-axial fluid. Age appropriate atrophy is present (78 years old.  Chronic microvascular ischemic change is present in the periventricular and subcortical white matter.  The sella may be mildly expanded, extending focally downward on the right into the sphenoid sinus.  A small pituitary adenoma could cause this remodeling, or perhaps empty sella although less likely. No suprasellar extension.  If clinically indicated, MRI without and with contrast with attention to the pituitary could be helpful in further evaluation.  Bilateral chronic basal ganglia lacunar infarcts are present, slightly larger on the left.  No visible acute cortical infarct. The calvarium is intact.  There is moderately advanced vascular calcification, particularly in the proximal basilar and distal left vertebral.   There is a large sebaceous cyst in the left parietal scalp.  There is no acute sinus or mastoid disease.  IMPRESSION: No acute stroke or bleed.  Atrophy and small vessel disease is similar to priors.  The sella may be focally expanded inferiorly on the right. Pituitary adenoma not excluded.  Original Report Authenticated By:  Elsie Stain, M.D.   Mr Brain Wo Contrast  03/13/2011  *RADIOLOGY REPORT*  Clinical Data: Right-sided weakness with slurred speech.  MRI HEAD WITHOUT CONTRAST  Technique:  Multiplanar, multiecho pulse sequences of the brain and surrounding structures were obtained according to standard protocol without intravenous contrast.  Comparison: 03/13/2011 head CT.  No comparison brain MR.  Findings: The patient was only able to complete three sequences.  Acute non hemorrhagic scattered small infarcts involving portions of the left sub insular region, left external capsule, posterior left corona radiata and left parietal lobe.  No obvious intracranial hemorrhage.  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  Remote basal ganglia/mid corona radiata infarct.  Moderate small vessel disease type changes.  Global atrophy without hydrocephalus.  Major intracranial vascular structures appear patent.  IMPRESSION: The patient was only able to complete three sequences.  Acute non hemorrhagic scattered small infarcts involving portions of the left sub insular region, left external capsule, posterior left corona radiata and left parietal lobe.  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  Remote basal ganglia/mid corona radiata infarct.  Moderate small vessel disease type changes.  Global atrophy without hydrocephalus.  Original Report Authenticated By: Fuller Canada, M.D.    Brief H and P: 78 year old female with a past medical history significant for diabetes, hypertension, hyperlipidemia; who came to the major department secondary to right-sided weakness and dysarthria. Patient was last seen normal by family members around 10 PM last night. Patient lives alone and isn't to her sons apparently oral form. Patient had gotten up around 5 AM and felt to be weak on her right side  (especially arm) and also some dysarthria noted by family members. By the time EMS arrived to her house her symptoms were improving slightly, Upon arrival to emergency department patient unable to leave her right arm he gains gravity, she was also unable to hold her right leg up off the bed for more than one second. There was no facial droop. Her speech appears to be clear. No chest pain or palpitations. Patient denies any trauma or recent illness. No fever, no chills, no nausea/vomiting/diarrhea or any other acute complaints.  CT scan of the head failed to demonstrated any acute abnormalities. Patient was admitted for further evaluation and treatment.  Hospital Course:  1-Left brain stroke with right hemiparesis: No clots or open foramen ovale on 2-D echo; will now use plavix for secondary prevention and focus on aggressive risk factors modifications; antihypertensive regimen has been adjusted and patient started on statins for HLD. She will continue metformin at discharge for her diabetes; A1C 6.7. She will be transfer to inpatient rehab for physical rehabilitation process  2-HTN (hypertension): much better; continue lisinopril and coreg.   3-DM2 (diabetes mellitus, type 2): continue SSI; with A1C demonstrating good control; will resume metformin at discharge.   4-Insomnia: PRN zolpidem  5-Hemiparesis, right: due to stroke; work up and treatment as specified above.   6-HLD (hyperlipidemia): continue zocor   7-Dysphagia:Per ST; dyspagia 2 with nectar thick liquids.   Time spent on Discharge: 35 minutes  Signed: Lauran Romanski 03/15/2011, 3:37 PM

## 2011-03-15 NOTE — Progress Notes (Addendum)
Speech Language/Pathology: Dysphagia Treatment Note  Subjective:  Pt. Sitting in recliner, calm demeanor  Objective:  Reassessed with thin liquids today to observe safety and potential for upgrade from thick liquids versus MBS.   Pt. with 5 second delayed cough indicative of poor airway protection.  Per family, pt. Has been experiencing swallow difficulty at home for 6 months with weight loss.  Assessment:  Suspect pt. has either a sensory and/or motor dysphagia based on clinical observations and family report.  Plan:  MBS today at 11:30  Royce Macadamia M.Ed [employer] [phone]  03/15/2011

## 2011-03-15 NOTE — Progress Notes (Signed)
Occupational Therapy Treatment Patient Details Name: Christina Pittman MRN: [id number] DOB: 12-31-1919 Today's Date: 03/15/2011  OT Assessment/Plan OT Assessment/Plan Comments on Treatment Session: pt progressing today to sink level task with LOB and max (A) for balance. pt is excellent rehab candidate. pt with cognition deficits and perseverating on topics. Pt perseverating on objects being moved in the room, d/c home and OOB OT Plan: Discharge plan remains appropriate OT Frequency: Min 2X/week Follow Up Recommendations: Inpatient Rehab Equipment Recommended: Defer to next venue OT Goals Acute Rehab OT Goals OT Goal Formulation: With patient/family Time For Goal Achievement: 2 weeks ADL Goals Pt Will Perform Grooming: with cueing (comment type and amount) (Min v/c and min visual cues Min Guard (A)) ADL Goal: Grooming - Progress: Progressing toward goals Pt Will Perform Upper Body Bathing: with min assist;Sitting, chair ADL Goal: Upper Body Bathing - Progress: Progressing toward goals Pt Will Perform Upper Body Dressing: with min assist;Sitting, chair;Supported ADL Goal: Location manager Dressing - Progress: Progressing toward goals Pt Will Transfer to Toilet: with min assist;3-in-1;Ambulation ADL Goal: Toilet Transfer - Progress: Progressing toward goals Pt Will Perform Toileting - Clothing Manipulation: with min assist;Sitting on 3-in-1 or toilet ADL Goal: Toileting - Clothing Manipulation - Progress: Progressing toward goals Pt Will Perform Toileting - Hygiene: with min assist;Standing at 3-in-1/toilet ADL Goal: Toileting - Hygiene - Progress: Progressing toward goals Miscellaneous OT Goals Miscellaneous OT Goal #1: Pt will located 5 out 6 objects on Lt side of tray with only one simple verbal cue to locate object to facilitate compensatory strategies to look Lt OT Goal: Miscellaneous Goal #1 - Progress: Progressing toward goals  OT Treatment Precautions/Restrictions   Precautions Precautions: Fall Precaution Comments: impulsive Required Braces or Orthoses: No Restrictions Weight Bearing Restrictions: No   ADL ADL Eating/Feeding Details (indicate cue type and reason): Son reports (A) pt with breakfast this morning. Grooming: Performed;Wash/dry hands;Maximal assistance Grooming Details (indicate cue type and reason): Pt required Max (A) for balance. Pt using Lt UE to place soap in Rt UE hand. pt required total (A) to supinate/ pronate. Pt with no active Rt UE participation with hand washing. Pt reports water as being "COLD" and warm water currently running over hand. Pt required mod v/c during task and v/c to attend to Rt UE. initially pt only trying to wash Lt UE. pt lack of awareness of Rt UE placement and decreased sensation. Where Assessed - Grooming: Standing at sink Equipment Used:  (RW attempting initially but pt unable to grasp with Rt hand) Ambulation Related to ADLs: Pt required Max (A) with facilitation to weight shift to advance BIL LE. Pt with narrowed base of support. Pt advancing Lt LE and required (A) with Rt LE due to weakness. Pt HR 124 at highest during session. pt is not safe to use RW at this time. ADL Comments: Pt with balance deficits, lack of awareness of Rt UE location/ decreased sensation. Pt required (A) with bed mobility. Pt impulsive and reaching for RW with LT UE. pt required blocking to prevent LOB at EOB due to reaching to RW. Pt following simple commands 80% of session. Mobility  Bed Mobility Bed Mobility: Yes Supine to Sit: 3: Mod assist;HOB elevated (Comment degrees) (HOB 30 degrees) Supine to Sit Details (indicate cue type and reason): Pt initiating with Lt side of body. Pt pulling on mattress with Lt UE and long sitting. pt using abdominal muscles and Lt UE. Pt required Max (A) to bring rt side to EOB. Pt total (A) to  scoot to EOB. Transfers Transfers: Yes Sit to Stand: 2: Max assist;From bed Sit to Stand Details  (indicate cue type and reason): Pt with LOB posteriorly with standing. Pt reaching for environmental support. Stand to Sit: 3: Mod assist;To chair/3-in-1 Stand to Sit Details: pt sitting with Lt side and Rt side following Lt side incitiation. Exercises    End of Session OT - End of Session Equipment Utilized During Treatment: Gait belt Activity Tolerance: Patient tolerated treatment well Patient left: in chair;with call bell in reach;with family/visitor present (x4 family members) Nurse Communication: Mobility status for transfers General Behavior During Session: Restless Cognition: Impaired  Lucile Shutters  03/15/2011, 12:07 PM Pager: [phone]

## 2011-03-15 NOTE — PMR Pre-admission (Signed)
PMR Admission Coordinator Pre-Admission Assessment  Patient:  Christina Pittman is an 78 y.o., female MRN:  [id number] DOB:  Sep 11, 1919 Height:  5\' 2"  (157.5 cm) Weight:  61.78 kg (136 lb 3.2 oz)  Insurance Information: HMO:     PPO:      PCP:      IPA:      80/20:      OTHER:  PRIMARY:Medicare A/B      Policy#:[id number]      Subscriber:Latarsha Warmack CM Name:       Phone#:      Fax#:  Pre-Cert#:       Employer:Retired Benefits:  Phone #:      Name:[employer] Date:05/29/86     Deduct:$1184      Out of Pocket Max:0      Life HYQ:MVHQIONGE CIR:100%      SNF:100 days   LBD = 08/12/09 Outpatient:80%     Co-Pay:20% Home Health:100%      Co-Pay:none DME:80%     Co-Pay:20% Providers:patient's choice  Current Medical History:   Patient Admitting Diagnosis: L [place] infarct  History of Present Illness: Admitted 02/13 with right sided weakness and slurred speech.  MRI showed acute nonhemorrhagic scattered small infarcts involving portions of the left sub-insular region, left external capsule, posterior left corona radiata and left parietal lobe.  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  ST following for dysphagia 2 nectar thick liquids diet.  Patients Past Medical History:   Past Medical History  Diagnosis Date  . Arthritis   . Diabetes mellitus   . Hypertension   . Heart murmur    Family Medical History:  family history is not on file. NIH Stroke scale: Total: 11  Patients Current Diet: Dysphagia  Prior Rehab/Hospitalizations: No previous rehab admissions.  Current Medications: Current facility-administered medications:acetaminophen (TYLENOL) suppository 650 mg, 650 mg, Rectal, Q4H PRN, Vassie Loll, MD;  acetaminophen (TYLENOL) tablet 650 mg, 650 mg, Oral, Q4H PRN, Vassie Loll, MD;  carvedilol (COREG) tablet 12.5 mg,  12.5 mg, Oral, BID WC, Vassie Loll, MD, 12.5 mg at 03/15/11 0800;  clopidogrel (PLAVIX) tablet 75 mg, 75 mg, Oral, Q breakfast, Vassie Loll, MD, 75 mg at 03/15/11 0800 enoxaparin (LOVENOX) injection 40 mg, 40 mg, Subcutaneous, Q24H, Vassie Loll, MD, 40 mg at 03/14/11 1343;  food thickener (RESOURCE THICKENUP CLEAR) powder, , Oral, PRN, Annie Main, NP;  hydrALAZINE (APRESOLINE) injection 10 mg, 10 mg, Intravenous, Q8H PRN, Vassie Loll, MD, 10 mg at 03/14/11 0604;  insulin aspart (novoLOG) injection 0-5 Units, 0-5 Units, Subcutaneous, QHS, Vassie Loll, MD insulin aspart (novoLOG) injection 0-9 Units, 0-9 Units, Subcutaneous, TID WC, Vassie Loll, MD, 1 Units at 03/15/11 0800;  lisinopril (PRINIVIL,ZESTRIL) tablet 40 mg, 40 mg, Oral, Daily, Vassie Loll, MD, 40 mg at 03/15/11 1040;  LORazepam (ATIVAN) injection 1 mg, 1 mg, Intravenous, Once, Vassie Loll, MD;  LORazepam (ATIVAN) injection 1 mg, 1 mg, Intravenous, Once, Vassie Loll, MD, 0.5 mg at 03/14/11 1435 magnesium sulfate IVPB 1 g 100 mL, 1 g, Intravenous, Once, Vassie Loll, MD, 1 g at 03/14/11 1344;  ondansetron (ZOFRAN) injection 4 mg, 4 mg, Intravenous, Q6H PRN, Vassie Loll, MD;  senna-docusate (Senokot-S) tablet 1 tablet, 1 tablet, Oral, QHS PRN, Vassie Loll, MD;  simvastatin (ZOCOR) tablet 20 mg, 20 mg, Oral, q1800, Vassie Loll, MD, 20 mg at 03/14/11 2129  sodium chloride 0.9 % 1,000 mL with thiamine 100 mg, folic acid 1 mg, multivitamins adult 10 mL infusion, , Intravenous, Once, Vassie Loll, MD;  zolpidem [facility]) tablet 5 mg, 5 mg, Oral, QHS PRN, Vassie Loll, MD, 5 mg at 03/14/11 2126;  DISCONTD: amLODipine (NORVASC) tablet 5 mg, 5 mg, Oral, Daily, Vassie Loll, MD, 5 mg at 03/14/11 1121;  DISCONTD: food thickener (THICK IT) powder, , Oral, PRN, Annie Main, NP DISCONTD: magnesium sulfate IVPB 2 g 50 mL, 2 g, Intravenous, Once, Vassie Loll, MD  Additional Precautions/Restrictions: Precautions Precautions:  Fall Precaution Comments: impulsive Required Braces or Orthoses: No Restrictions Weight Bearing Restrictions: No  Therapy Assessments Physical Therapy: Precautions Precautions: Fall Precaution Comments: impulsive Required Braces or Orthoses: No Home Living Lives With: Alone Receives Help From: Family Type of Home: House Home Layout: One level Home Access: Stairs to enter;Ramped entrance Entrance Stairs-Rails: Can reach both Entrance Stairs-Number of Steps: 1 Bathroom Shower/Tub: Health visitor: Handicapped height Bathroom Accessibility: Yes How Accessible: Accessible via walker Home Adaptive Equipment: Walker - rolling;Quad cane;Grab bars around toilet;Grab bars in shower Additional Comments: Pt has family that leaves nearby and (A) daily. Pt son cooks breakfast everyday and checks on pt multiple times throughout the day. Prior Function Level of Independence: Independent with basic ADLs;Independent with homemaking with ambulation;Independent with gait;Independent with transfers Able to Take Stairs?: Yes Driving: Yes (drove 2 weeks ago) Vocation: Retired Comments: Pt's son educated on disable Camry car so that pt can not drive at this time. Pt demonstrates some visual deficits described below. Coordination Gross Motor Movements are Fluid and Coordinated: No Fine Motor Movements are Fluid and Coordinated: No Heel Shin Test: normal coordination  Occupational Therapy: Precautions Precautions: Fall Precaution Comments: impulsive Required Braces or Orthoses: No Home Living Lives With: Alone Receives Help From: Family Type of Home: House Home Layout: One level Home Access: Stairs to enter;Ramped entrance Entrance Stairs-Rails: Can reach both Entrance Stairs-Number of Steps: 1 Bathroom Shower/Tub: Health visitor: Handicapped height Bathroom Accessibility: Yes How Accessible: Accessible via walker Home Adaptive Equipment: Walker -  rolling;Quad cane;Grab bars around toilet;Grab bars in shower Additional Comments: Pt has family that leaves nearby and (A) daily. Pt son cooks breakfast everyday and checks on pt multiple times throughout the day. Prior Function Level of Independence: Independent with basic ADLs;Independent with homemaking with ambulation;Independent with gait;Independent with transfers Able to Take Stairs?: Yes Driving: Yes (drove 2 weeks ago) Vocation: Retired Comments: Pt's son educated on disable [employer] car so that pt can not drive at this time. Pt demonstrates some visual deficits described below. Coordination Gross Motor Movements are Fluid and Coordinated: No Fine Motor Movements are Fluid and Coordinated: No Heel Shin Test: normal coordination Restrictions Weight Bearing Restrictions: No ADL Eating/Feeding: Simulated;Maximal assistance Eating/Feeding Details (indicate cue type and reason): Rt UE supported at elbow and utensil held into palm of hand pt is able to [facility] elbow flexion to mouth. Pt could benefit from universal cuff for self feeding with Rt UE and support AAROM Elbow flexion/extension Where Assessed - Eating/Feeding: Chair Lower Body Dressing: Performed;Maximal assistance Lower Body Dressing Details (indicate cue type and reason): don socks. Family in room reports pt doff socks independently in chair. Pt required Max (A) to cross LE and sock don partially. Pt able to pull sock from half don over heel to complete task on Rt LE only Where Assessed - Lower Body Dressing: Sitting, chair;Supported Ambulation Related to ADLs: Pt provided Max (A) with Rt side  weakness ADL Comments: Pt requesting to d/c home and son plans to hire (A) for home. Pt's son educated that home health only visits 2-3 times per week for hour at most during one visit. Pt's son requesting that home health come every day and alternate disciplines. Pt will require Mod (A) for transfers at d/c.  SLP Recommendations: Follow up  Recommendations:  (TBD) Equipment Recommended: Defer to next venue Recommended Consults:  (MAY NEED MBS) Solid Consistency: Dysphagia 2 (Fine chop) Liquid Consistency: Nectar Liquid Administration via: Cup;No straw Medication Administration: Whole meds with puree Supervision: Full supervision/cueing for compensatory strategies Compensations: Slow rate;Small sips/bites;Check for pocketing (NO TALKING DURING MEALS) Postural Changes and/or Swallow Maneuvers: Seated upright 90 degrees Other Recommendations: Order thickener from pharmacy;Prohibited food (jello, ice cream, thin soups) Follow up Recommendations:  (TBD)  Prior Function: Level of Independence: Independent with basic ADLs;Independent with homemaking with ambulation;Independent with gait;Independent with transfers Able to Take Stairs?: Yes Driving: Yes (drove 2 weeks ago) Vocation: Retired Comments: Pt's son educated on disable Camry car so that pt can not drive at this time. Pt demonstrates some visual deficits described below. ADL Eating/Feeding: Simulated;Maximal assistance Eating/Feeding Details (indicate cue type and reason): Rt UE supported at elbow and utensil held into palm of hand pt is able to [facility] elbow flexion to mouth. Pt could benefit from universal cuff for self feeding with Rt UE and support AAROM Elbow flexion/extension Where Assessed - Eating/Feeding: Chair Lower Body Dressing: Performed;Maximal assistance Lower Body Dressing Details (indicate cue type and reason): don socks. Family in room reports pt doff socks independently in chair. Pt required Max (A) to cross LE and sock don partially. Pt able to pull sock from half don over heel to complete task on Rt LE only Where Assessed - Lower Body Dressing: Sitting, chair;Supported Ambulation Related to ADLs: Pt provided Max (A) with Rt side weakness ADL Comments: Pt requesting to d/c home and son plans to hire (A) for home. Pt's son educated that home health only  visits 2-3 times per week for hour at most during one visit. Pt's son requesting that home health come every day and alternate disciplines. Pt will require Mod (A) for transfers at d/c.  Additional Prior Functional Levels:  Bed Mobility: I Transfers: I Mobility - Walk/Wheelchair: I Upper Body Dressing: I Lower Body Dressing: I Grooming: I Eating/Drinking: I Toilet Transfer: I Bladder Continence: WNL Bowel Management: constipation Stair Climbing: I Communication: WNL Memory: WNL Cooking/Meal Prep: I Housework: I Money Management: I Driving: yes  Prior Activity Level: Limited Community (1-2x/wk): Went out 2-3 x week  ADLs/Mobility: ADL Eating/Feeding: Simulated;Maximal assistance Eating/Feeding Details (indicate cue type and reason): Rt UE supported at elbow and utensil held into palm of hand pt is able to [facility] elbow flexion to mouth. Pt could benefit from universal cuff for self feeding with Rt UE and support AAROM Elbow flexion/extension Where Assessed - Eating/Feeding: Chair Lower Body Dressing: Performed;Maximal assistance Lower Body Dressing Details (indicate cue type and reason): don socks. Family in room reports pt doff socks independently in chair. Pt required Max (A) to cross LE and sock don partially. Pt able to pull sock from half don over heel to complete task on Rt LE only Where Assessed - Lower Body Dressing: Sitting, chair;Supported Ambulation Related to ADLs: Pt provided Max (A) with Rt side weakness ADL Comments: Pt requesting to d/c home and son plans to hire (A) for home. Pt's son educated that home health only visits 2-3 times per week  for hour at most during one visit. Pt's son requesting that home health come every day and alternate disciplines. Pt will require Mod (A) for transfers at d/c.  Bed Mobility Bed Mobility: Yes Sit to Supine: 3: Mod assist;HOB flat ((A) for BIL LE) Transfers Transfers: Yes Sit to Stand: With upper extremity assist;3: Mod  assist;From chair/3-in-1 (Pt hand over hand to facilitate Rt UE weight bearing) Sit to Stand Details (indicate cue type and reason): Max v/c. Pt provided facilitaiton for hip flexion into upright posture Stand to Sit: 2: Max assist;With upper extremity assist;To chair/3-in-1 Stand to Sit Details: pt uncontrolled descend to chair and (A) to control. Ambulation/Gait Ambulation/Gait: Yes Ambulation/Gait Assistance: 3: Mod assist Ambulation/Gait Assistance Details (indicate cue type and reason): Mod assist for ambulation sceondary to safety and stabilization as well as assist to control RW. Pt unable to use RUE to control RW, therefore was drifting to the right. Will attempt hand held ambulation next session. VC for technique and sequencing Ambulation Distance (Feet): 20 Feet (distance limited by HR) Assistive device: Rolling walker Gait Pattern: Step-to pattern;Decreased step length - left;Decreased stance time - right;Decreased hip/knee flexion - right;Decreased dorsiflexion - right Gait velocity: Decreased gait speed Stairs: No  Home Assistive Devices/Equipment:  Home Assistive Devices/Equipment Home Assistive Devices/Equipment: Walker (specify type);Elevated toliet seat  Discharge Planning:  Living Arrangements: Alone Support Systems: Children;Family members Assistance Needed: possibly Do you have any problems obtaining your medications?: No Type of Residence: Private residence Home Care Services: No Patient expects to be discharged to:: home Expected Discharge Date:  (pending) Case Management Consult Needed: No  Previous Home Environment:  Living Arrangements: Alone Support Systems: Children;Family members Assistance Needed: possibly Do you have any problems obtaining your medications?: No Type of Residence: Private residence Home Care Services: No Patient expects to be discharged to:: home Expected Discharge Date:  (pending)  Discharge Living Setting:  Plans for Discharge Living  Setting: Patient's home;Alone;House Discharge Living Setting Number of Levels: 1 Discharge Living Setting Number of Steps: 1 and has a ramp at the back of house Discharge Living Setting is Bedroom on Main Floor?: Yes Discharge Living Setting is Bathroom on Main Floor?: Yes  Social/Family/Support Systems:  Patient Roles: Parent Contact Information: Julian Hy - Dtr, Greggory Stallion - son, and Michaelah Credeur - son Anticipated Caregiver: Children to take turns staying with patient providing supervision Anticipated Caregiver's Contact Information: [name] [phone] (c) [phone] Bernette Redbird (c) [phone] Bethann Berkshire ([phone] Ability/Limitations of Caregiver: Children can assist Caregiver Availability: 24/7 Discharge Plan Discussed with Primary Caregiver: Yes Is Caregiver In Agreement with Plan?: Yes Does Caregiver/Family have Issues with Lodging/Transportation while Pt is in Rehab?: No  Goals/Additional Needs:  Patient/Family Goal for Rehab: PT/OT/ST S goals (ELOS = 10 days (patient hopes it is 1 week)) Cultural Considerations: [employer] Dietary Needs: Dysphagia 2, nectar thick liquids Equipment Needs: TBD Pt/Family Agrees to Admission and willing to participate: Yes Program Orientation Provided & Reviewed with Pt/Caregiver Including Roles  & Responsibilities: Yes  Preadmission Screen Completed By:  Trish Mage, 03/15/2011 11:37 AM  Patient's condition:  This patient's condition remains as documented in the Consult dated 03/14/11, in which the Rehabilitation Physician determined and documented that the patient's condition is appropriate for intensive rehabilitative care in an inpatient rehabilitation facility.  Preadmission Screen Competed by: Roderic Palau, RN, Time/Date,1147/03/15/13.  Discussed status with Dr. Riley Kill on 03/15/11 at 1149 (time/date) and received telephone approval for admission today.  Admission Coordinator:  Trish Mage, time1149/Date02/15/13

## 2011-03-15 NOTE — Progress Notes (Signed)
Stroke Team Progress Note  HISTORY Christina Pittman is an 78 y.o. female who was found down at 2 am 03/13/2011 by her family with right-sided weakness and slurred speech. She was brought to an affiliate hospital and transferred here for an evaluation. She was not a t-pa candidate as she was out of the window by the time of arrival to this hospital.   SUBJECTIVE Multiple family members at bedside. Rehab admission RN to see pt today.  OBJECTIVE Filed Vitals:   03/14/11 1801 03/14/11 2136 03/15/11 0118 03/15/11 0700  BP: 150/67 112/74 150/103 142/91  Pulse: 118 100 109 113  Temp: 98.4 F (36.9 C) 97.6 F (36.4 C) 98.1 F (36.7 C) 97.4 F (36.3 C)  TempSrc: Oral Oral Oral Oral  Resp: 20 20 20 20   Height:      Weight:      SpO2: 97% 94% 97% 96%   CBG (last 3)  Basename 03/15/11 0740 03/14/11 2132 03/14/11 1544  GLUCAP 131* 156* 144*   Intake/Output from previous day: 02/14 0701 - 02/15 0700 In: 110 [P.O.:110] Out: -  IV Fluid Intake:    Medications    . carvedilol  12.5 mg Oral BID WC  . clopidogrel  75 mg Oral Q breakfast  . enoxaparin  40 mg Subcutaneous Q24H  . insulin aspart  0-5 Units Subcutaneous QHS  . insulin aspart  0-9 Units Subcutaneous TID WC  . lisinopril  40 mg Oral Daily  . LORazepam  1 mg Intravenous Once  . LORazepam  1 mg Intravenous Once  . magnesium sulfate 1 - 4 g bolus IVPB  1 g Intravenous Once  . simvastatin  20 mg Oral q1800  . general admission iv infusion   Intravenous Once  . DISCONTD: amLODipine  5 mg Oral Daily  . DISCONTD: aspirin EC  81 mg Oral Daily  . DISCONTD: magnesium sulfate 1 - 4 g bolus IVPB  2 g Intravenous Once  PRN acetaminophen, acetaminophen, food thickener, hydrALAZINE, ondansetron (ZOFRAN) IV, senna-docusate, zolpidem, DISCONTD: food thickener  Diet:  Dysphagia 2 Nectar thick liquids Activity:   Up with assistance DVT Prophylaxis:  Lovenox 40 mg sq daily   Significant Diagnostic Studies: CBC    Component Value Date/Time   WBC 16.2* 03/14/2011 0705   RBC 4.90 03/14/2011 0705   HGB 15.2* 03/14/2011 0705   HCT 43.7 03/14/2011 0705   PLT 314 03/14/2011 0705   MCV 89.2 03/14/2011 0705   MCH 31.0 03/14/2011 0705   MCHC 34.8 03/14/2011 0705   RDW 13.7 03/14/2011 0705   LYMPHSABS 1.5 03/13/2011 0820   MONOABS 0.8 03/13/2011 0820   EOSABS 0.3 03/13/2011 0820   BASOSABS 0.1 03/13/2011 0820   CMP    Component Value Date/Time   NA 137 03/14/2011 0705   K 4.0 03/14/2011 0705   CL 101 03/14/2011 0705   CO2 20 03/14/2011 0705   GLUCOSE 128* 03/14/2011 0705   BUN 13 03/14/2011 0705   CREATININE 0.96 03/14/2011 0705   CALCIUM 10.1 03/14/2011 0705   PROT 7.2 03/13/2011 0820   ALBUMIN 3.7 03/13/2011 0820   AST 25 03/13/2011 0820   ALT 21 03/13/2011 0820   ALKPHOS 88 03/13/2011 0820   BILITOT 0.3 03/13/2011 0820   GFRNONAA 51* 03/14/2011 0705   GFRAA 59* 03/14/2011 0705   COAGS Lab Results  Component Value Date   INR 1.06 03/13/2011   Lipid Panel    Component Value Date/Time   CHOL 191 03/14/2011 0705   TRIG 114  03/14/2011 0705   HDL 50 03/14/2011 0705   CHOLHDL 3.8 03/14/2011 0705   VLDL 23 03/14/2011 0705   LDLCALC 118* 03/14/2011 0705   HgbA1C  Lab Results  Component Value Date   HGBA1C 6.7* 03/14/2011   Urine Drug Screen  No results found for this basename: labopia,  cocainscrnur,  labbenz,  amphetmu,  thcu,  labbarb    Alcohol Level No results found for this basename: eth   UA negative  CT of the brain   03/13/2011  No acute stroke or bleed.  Atrophy and small vessel disease is similar to priors.  The sella may be focally expanded inferiorly on the right. Pituitary adenoma not excluded.    MRI of the brain  The patient was only able to complete three sequences.  Acute non hemorrhagic scattered small infarcts involving portions of the left sub insular region, left external capsule, posterior left corona radiata and left parietal lobe.  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  Remote basal ganglia/mid corona radiata infarct.  Moderate small vessel disease type changes.  Global atrophy without hydrocephalus.    MRA of the brain  Not ordered   2D Echocardiogram  ordered   Carotid Doppler  No internal carotid artery stenosis bilaterally. Vertebrals with antegrade flow bilaterally.   CXR   03/13/2011    1.  Borderline cardiomegaly and chronic changes.  No acute disease.    03/13/2011    No acute findings  EKG  normal sinus rhythm, left axis deviation, 1st degree AV block.   Physical Exam   Pleasant elderly Caucasian lady currently not in distress. Afebrile. Head is nontraumatic. Neck is supple with soft bruit. Hearing is decreased bilaterally. Cardiac exam sinus tachycardia no murmur or gallop. Lungs clear to auscultation. Neurological exam awake alert oriented x2. Diminished attention, short-term memory and recall. Follow simple one-step commands only. Moderate dysarthria but can be understood.mild expressive aphasia with decreased word fluency and few paraphasias. Extraocular movements are full range without nystagmus. Blinks to threat bilaterally. Mild right lower facial weakness. Tongue is midline. Motor system exam reveals mild right upper extremity drift. Significant right grip and intrinsic hand muscle weakness.mild right lower extremity weakness 4/5. Diminished sensation on the right. Gait was not tested.  ASSESSMENT Christina Pittman is a 78 y.o. female with a scattered small infarcts involving portions of the left sub insular region, left external capsule, posterior left corona radiata and left parietal lobe with right hemiparesis, mild expressive aphasia, verbal paraphasias, dysnomia and slurred speech. Infarcts secondary to likely embolic source, not a coumadin candidate due to fall risk, no further workup for embolic source. On aspirin 81 mg orally every day and clopidogrel 75 mg orally every day for secondary stroke prevention.  Needs inpatient rehab.  - pituitary tumor  - diabetes - hypertension, acclerated - tachycardia  Hospital day # 2  TREATMENT/PLAN - recommend rehab.  - follow up TCDs - Continue clopidogrel 75 mg orally every day for secondary stroke prevention. -aggressive risk factor control -discussed with patient and multiple family members   Annie Main, AVNP, ANP-BC, GNP-BC [facility] Pager: [phone] 03/15/2011 10:11 AM  Dr. Delia Heady, Stroke Center Medical Director, has personally reviewed chart, pertinent data, examined the patient and developed the plan of care.

## 2011-03-15 NOTE — Progress Notes (Signed)
PT Cancellation Note  Treatment cancelled today due to patient's refusal to participate. Pt in bed and does not feel well. Pt has had difficulty with BP this afternoon and increased confusion. Will attempt treatment tomorrow  03/15/2011 Milana Kidney DPT PAGER: [phone] OFFICE: [phone]    Milana Kidney 03/15/2011, 1:52 PM

## 2011-03-15 NOTE — Progress Notes (Signed)
Patient admitted to room 4038.  Oriented to unit, Rehab philosophy, lead nursing.  Fall prevention plan reviewed. Christina Pittman

## 2011-03-15 NOTE — H&P (Signed)
Physical Medicine and Rehabilitation Admission H&P  Chief Complaint   Patient presents with   .  Weakness   :  HPI: MS. Christina Pittman 78 year old right-handed female admitted February 13 with right-sided weakness and slurred speech. MRI of the brain showed acute nonhemorrhagic scattered small infarcts involving portions of the left sub-insular region, left external capsule, posterior left corona radiata and left parietal lobe. 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 Carotid Dopplers with no significant internal carotid artery stenosis. Echocardiogram done revealing moderate LVH with EF 65-70 % and moderate to severe aortic stenosis. .Neurology was consulted recommends continuing plavix Therapy and aggressive risk factor control. It was felt that infarcts most likely secondary to embolic source however not a Coumadin candidate due to fall risk. There is no current mention of plan for pituitary tumor identified for MRI. Speech therapy followup for dysphagia placed on dysphagia 2 nectar thick liquid diet. Patient with swallow difficulty for past 6 months with weight loss and ST feels patient likely with sensory and/or motor dysphagia. Subcutaneous Lovenox was added for deep vein thrombosis prophylaxis. Patient with cognitive deficits, decreased awareness of deficits and perseveration. Needs moderate VC for ADL tasks and to attend to RUE.  Review of Systems  HENT: Positive for hearing loss.  Respiratory: Negative for cough, sputum production and shortness of breath.  Cardiovascular: Negative for palpitations.  Gastrointestinal: Negative for heartburn and abdominal pain.  Genitourinary: Positive for frequency.  Incontinence new since admission.  Musculoskeletal: Positive for back pain and joint pain (right knee).  Neurological: Positive  for sensory change, speech change and focal weakness. Negative for headaches.  Endo/Heme/Allergies: Bruises/bleeds easily.  Psychiatric/Behavioral: Negative for hallucinations. The patient is not nervous/anxious.  All other systems reviewed and are negative.   Past Medical History   Diagnosis  Date   .  Arthritis    .  Diabetes mellitus    .  Hypertension    .  Heart murmur     Past Surgical History   Procedure  Date   .  Abdominal hysterectomy     Family history:  Cerebral hemorrhage- mother, brother, sister.  Social History: Lives alone. Independent without assistive device and still drives. She reports that she has never smoked. She dips snuff- a box a week.. She reports that she does not drink alcohol or use illicit drugs.  Allergies:  Allergies   Allergen  Reactions   .  Betadine (Povidone Iodine)  Hives   .  Neosporin (Neomycin-Polymyx-Gramicid)  Hives    Medications Prior to Admission   Medication  Dose  Route  Frequency  Provider  Last Rate  Last Dose   .  acetaminophen (TYLENOL) tablet 650 mg  650 mg  Oral  Q4H PRN  Vassie Loll, MD      Or   .  acetaminophen (TYLENOL) suppository 650 mg  650 mg  Rectal  Q4H PRN  Vassie Loll, MD     .  aspirin chewable tablet 324 mg  324 mg  Oral  Once  Peter A. Patrica Duel, MD   324 mg at 03/13/11 0906   .  carvedilol (COREG) tablet 12.5 mg  12.5 mg  Oral  BID WC  Vassie Loll, MD   12.5 mg at 03/15/11 0800   .  clopidogrel (PLAVIX) tablet 75 mg  75 mg  Oral  Q breakfast  Vassie Loll, MD   75 mg at 03/15/11 0800   .  enoxaparin (LOVENOX) injection 40 mg  40 mg  Subcutaneous  Q24H  Vassie Loll, MD   40 mg at 03/14/11 1343   .  food thickener (RESOURCE THICKENUP CLEAR) powder   Oral  PRN  Annie Main, NP     .  hydrALAZINE (APRESOLINE) injection 10 mg  10 mg  Intravenous  Q8H PRN  Vassie Loll, MD   10 mg at 03/14/11 0604   .  insulin aspart (novoLOG) injection 0-5 Units  0-5 Units  Subcutaneous  QHS  Vassie Loll, MD     .  insulin  aspart (novoLOG) injection 0-9 Units  0-9 Units  Subcutaneous  TID WC  Vassie Loll, MD   1 Units at 03/15/11 0800   .  lisinopril (PRINIVIL,ZESTRIL) tablet 40 mg  40 mg  Oral  Daily  Vassie Loll, MD   40 mg at 03/15/11 1040   .  LORazepam (ATIVAN) injection 1 mg  1 mg  Intravenous  Once  Vassie Loll, MD   1 mg at 03/13/11 1307   .  LORazepam (ATIVAN) injection 1 mg  1 mg  Intravenous  Once  Vassie Loll, MD     .  LORazepam (ATIVAN) injection 1 mg  1 mg  Intravenous  Once  Vassie Loll, MD   0.5 mg at 03/14/11 1435   .  magnesium sulfate IVPB 1 g 100 mL  1 g  Intravenous  Once  Vassie Loll, MD   1 g at 03/14/11 1344   .  ondansetron (ZOFRAN) injection 4 mg  4 mg  Intravenous  Q6H PRN  Vassie Loll, MD     .  senna-docusate (Senokot-S) tablet 1 tablet  1 tablet  Oral  QHS PRN  Vassie Loll, MD     .  simvastatin (ZOCOR) tablet 20 mg  20 mg  Oral  q1800  Vassie Loll, MD   20 mg at 03/14/11 2129   .  sodium chloride 0.9 % 1,000 mL with thiamine 100 mg, folic acid 1 mg, multivitamins adult 10 mL infusion   Intravenous  Once  Vassie Loll, MD     .  zolpidem Remus Loffler) tablet 5 mg  5 mg  Oral  QHS PRN  Vassie Loll, MD   5 mg at 03/14/11 2126   .  DISCONTD: amLODipine (NORVASC) tablet 5 mg  5 mg  Oral  Daily  Vassie Loll, MD   5 mg at 03/14/11 1121   .  DISCONTD: aspirin EC tablet 81 mg  81 mg  Oral  Daily  Vassie Loll, MD   81 mg at 03/13/11 2000   .  DISCONTD: aspirin suppository 300 mg  300 mg  Rectal  Daily  Vassie Loll, MD     .  DISCONTD: aspirin tablet 325 mg  325 mg  Oral  Daily  Vassie Loll, MD     .  DISCONTD: enoxaparin (LOVENOX) injection 40 mg  40 mg  Subcutaneous  Q24H  Vassie Loll, MD     .  DISCONTD: food thickener (THICK IT) powder   Oral  PRN  Annie Main, NP     .  DISCONTD: labetalol (NORMODYNE,TRANDATE) injection 10 mg  10 mg  Intravenous  Once  Peter A. Patrica Duel, MD     .  DISCONTD: magnesium sulfate IVPB 2 g 50 mL  2 g  Intravenous  Once  Vassie Loll, MD  Medications Prior to Admission   Medication  Sig  Dispense  Refill   .  acetaminophen (TYLENOL) 500 MG tablet  Take 500 mg by mouth every 6 (six) hours as needed. For pain     .  aspirin EC 81 MG tablet  Take 81 mg by mouth daily.     Marland Kitchen  lisinopril (PRINIVIL,ZESTRIL) 10 MG tablet  Take 10 mg by mouth daily.      Home:  Home Living  Lives With: Alone  Receives Help From: Family  Type of Home: House  Home Layout: One level  Home Access: Stairs to enter;Ramped entrance  Entrance Stairs-Rails: Can reach both  Entrance Stairs-Number of Steps: 1  Bathroom Shower/Tub: Pension scheme manager: Handicapped height  Bathroom Accessibility: Yes  How Accessible: Accessible via walker  Home Adaptive Equipment: Walker - rolling;Quad cane;Grab bars around toilet;Grab bars in shower  Additional Comments: Pt has family that leaves nearby and (A) daily. Pt son cooks breakfast everyday and checks on pt multiple times throughout the day.  Functional History:  Prior Function  Level of Independence: Independent with basic ADLs;Independent with homemaking with ambulation;Independent with gait;Independent with transfers  Able to Take Stairs?: Yes  Driving: Yes (drove 2 weeks ago)  Vocation: Retired  Comments: Pt's son educated on disable Camry car so that pt can not drive at this time. Pt demonstrates some visual deficits described below.  Functional Status:  Mobility:  Bed Mobility  Bed Mobility: Yes  Supine to Sit: 3: Mod assist;HOB elevated (Comment degrees) (HOB 30 degrees)  Supine to Sit Details (indicate cue type and reason): Pt initiating with Lt side of body. Pt pulling on mattress with Lt UE and long sitting. pt using abdominal muscles and Lt UE. Pt required Max (A) to bring rt side to EOB. Pt total (A) to scoot to EOB.  Sit to Supine: 3: Mod assist;HOB flat ((A) for BIL LE)  Transfers  Transfers: Yes  Sit to Stand: 2: Max assist;From bed  Sit to Stand Details (indicate cue type and  reason): Pt with LOB posteriorly with standing. Pt reaching for environmental support.  Stand to Sit: 3: Mod assist;To chair/3-in-1  Stand to Sit Details: pt sitting with Lt side and Rt side following Lt side incitiation.  Ambulation/Gait  Ambulation/Gait: Yes  Ambulation/Gait Assistance: 3: Mod assist  Ambulation/Gait Assistance Details (indicate cue type and reason): Mod assist for ambulation sceondary to safety and stabilization as well as assist to control RW. Pt unable to use RUE to control RW, therefore was drifting to the right. Will attempt hand held ambulation next session. VC for technique and sequencing  Ambulation Distance (Feet): 20 Feet (distance limited by HR)  Assistive device: Rolling walker  Gait Pattern: Step-to pattern;Decreased step length - left;Decreased stance time - right;Decreased hip/knee flexion - right;Decreased dorsiflexion - right  Gait velocity: Decreased gait speed  Stairs: No   ADL:  ADL  Eating/Feeding: Simulated;Maximal assistance  Eating/Feeding Details (indicate cue type and reason): Son reports (A) pt with breakfast this morning.  Where Assessed - Eating/Feeding: Chair  Grooming: Performed;Wash/dry hands;Maximal assistance  Grooming Details (indicate cue type and reason): Pt required Max (A) for balance. Pt using Lt UE to place soap in Rt UE hand. pt required total (A) to supinate/ pronate. Pt with no active Rt UE participation with hand washing. Pt reports water as being "COLD" and warm water currently running over hand. Pt required mod v/c during task and v/c to attend to Rt UE.  initially pt only trying to wash Lt UE. pt lack of awareness of Rt UE placement and decreased sensation.  Where Assessed - Grooming: Standing at sink  Lower Body Dressing: Performed;Maximal assistance  Lower Body Dressing Details (indicate cue type and reason): don socks. Family in room reports pt doff socks independently in chair. Pt required Max (A) to cross LE and sock don  partially. Pt able to pull sock from half don over heel to complete task on Rt LE only  Where Assessed - Lower Body Dressing: Sitting, chair;Supported  Equipment Used: (RW attempting initially but pt unable to grasp with Rt hand)  Ambulation Related to ADLs: Pt required Max (A) with facilitation to weight shift to advance BIL LE. Pt with narrowed base of support. Pt advancing Lt LE and required (A) with Rt LE due to weakness. Pt HR 124 at highest during session. pt is not safe to use RW at this time.  ADL Comments: Pt with balance deficits, lack of awareness of Rt UE location/ decreased sensation. Pt required (A) with bed mobility. Pt impulsive and reaching for RW with LT UE. pt required blocking to prevent LOB at EOB due to reaching to RW. Pt following simple commands 80% of session.  Cognition:  Cognition  Overall Cognitive Status: Appears within functional limits for tasks assessed  Arousal/Alertness: Awake/alert  Orientation Level: Oriented to person;Disoriented to time;Oriented to place;Disoriented to situation  Cognition  Arousal/Alertness: Awake/alert  Overall Cognitive Status: Impaired  Attention: Impaired  Current Attention Level: Selective  Memory: Appears impaired  Memory Deficits: repeating same statements continously. Pt no recall of previous answer.  Orientation Level: Oriented to person;Disoriented to time;Oriented to place;Disoriented to situation  Following Commands: Follows one step commands inconsistently;Follows one step commands with increased time  Safety/Judgement: Decreased awareness of safety precautions  Decreased Safety/Judgement: Impulsive  Safety/Judgement - Other Comments: pt attempting sit<>Stand and states "i am going home"  Awareness of Errors: Decreased awareness of errors made  Decreased Awareness of Errors: Assistance required to correct errors made  Awareness of Deficits: Decreased awareness of deficits  Problem Solving: Requires assistance for problem  solving  Blood pressure 129/95, pulse 96, temperature 97.1 F (36.2 C), temperature source Oral, resp. rate 20, height 5\' 2"  (1.575 m), weight 61.78 kg (136 lb 3.2 oz), SpO2 97.00%.  Physical Exam  Nursing note and vitals reviewed.  Constitutional: She is oriented to person, place, and time. She appears well-developed and well-nourished.  HENT:  Head: Normocephalic and atraumatic.  Mouth/Throat: Oropharynx is clear and moist.  adentulous  Eyes: Pupils are equal, round, and reactive to light.  Neck: Normal range of motion. Neck supple.  Cardiovascular: Normal rate.  Murmur heard.  Systolic murmur is present  murmur  Pulmonary/Chest: Effort normal and breath sounds normal.  Abdominal: Soft. Bowel sounds are normal.  Musculoskeletal: She exhibits tenderness.  Diffuse ecchymosis BUE.  Neurological: She is alert and oriented to person, place, and time. A cranial nerve deficit is present.  Moderately dysarthric speech. Poor phonation. Follows basic commands without difficulty. Able to state date and DOB with minimal cues. Right UE inattention. Right upper extremity is 1-2/5 proximal and trace to absent distally. Right lower extremity grossly 2+ to 3/5 proximal distal. Mild sensory loss but still can discriminate between pain and light touch on the right. Right central 7 and tongue deviation.  Skin:  B-shins with stasis changes. Right great toe and 2-3 interdigital area with dry scabs - areas of prior ulcers. Healing ulcer over medial  portion of the right first toe. Multiple areas of chronic skin changes in all 4 limbs. Sun-related changes over the face as well.   Results for orders placed during the hospital encounter of 03/13/11 (from the past 48 hour(s))   CBC Status: Abnormal    Collection Time    03/13/11 1:06 PM   Component  Value  Range  Comment    WBC  12.8 (*)  4.0 - 10.5 (K/uL)     RBC  4.66  3.87 - 5.11 (MIL/uL)     Hemoglobin  14.4  12.0 - 15.0 (g/dL)     HCT  63.8  75.6 - 46.0  (%)     MCV  89.7  78.0 - 100.0 (fL)     MCH  30.9  26.0 - 34.0 (pg)     MCHC  34.4  30.0 - 36.0 (g/dL)     RDW  43.3  29.5 - 15.5 (%)     Platelets  259  150 - 400 (K/uL)    URINE CULTURE Status: Normal (Preliminary result)    Collection Time    03/13/11 4:32 PM   Component  Value  Range  Comment    Specimen Description  URINE, CLEAN CATCH      Special Requests  NONE      Culture Setup Time  [id number]      Colony Count  PENDING      Culture  Culture reincubated for better growth      Report Status  PENDING     URINALYSIS, ROUTINE W REFLEX MICROSCOPIC Status: Abnormal    Collection Time    03/13/11 4:32 PM   Component  Value  Range  Comment    Color, Urine  YELLOW  YELLOW     APPearance  CLEAR  CLEAR     Specific Gravity, Urine  1.008  1.005 - 1.030     pH  7.5  5.0 - 8.0     Glucose, UA  NEGATIVE  NEGATIVE (mg/dL)     Hgb urine dipstick  TRACE (*)  NEGATIVE     Bilirubin Urine  NEGATIVE  NEGATIVE     Ketones, ur  NEGATIVE  NEGATIVE (mg/dL)     Protein, ur  NEGATIVE  NEGATIVE (mg/dL)     Urobilinogen, UA  0.2  0.0 - 1.0 (mg/dL)     Nitrite  NEGATIVE  NEGATIVE     Leukocytes, UA  NEGATIVE  NEGATIVE    URINE MICROSCOPIC-ADD ON Status: Normal    Collection Time    03/13/11 4:32 PM   Component  Value  Range  Comment    Squamous Epithelial / LPF  RARE  RARE     WBC, UA  0-2  <3 (WBC/hpf)     RBC / HPF  0-2  <3 (RBC/hpf)    GLUCOSE, CAPILLARY Status: Abnormal    Collection Time    03/13/11 11:29 PM   Component  Value  Range  Comment    Glucose-Capillary  130 (*)  70 - 99 (mg/dL)    GLUCOSE, CAPILLARY Status: Abnormal    Collection Time    03/14/11 6:55 AM   Component  Value  Range  Comment    Glucose-Capillary  139 (*)  70 - 99 (mg/dL)    HEMOGLOBIN S0F Status: Abnormal    Collection Time    03/14/11 7:05 AM   Component  Value  Range  Comment    Hemoglobin A1C  6.7 (*)  <5.7 (%)  Mean Plasma Glucose  146 (*)  <117 (mg/dL)    CBC Status: Abnormal    Collection Time      03/14/11 7:05 AM   Component  Value  Range  Comment    WBC  16.2 (*)  4.0 - 10.5 (K/uL)     RBC  4.90  3.87 - 5.11 (MIL/uL)     Hemoglobin  15.2 (*)  12.0 - 15.0 (g/dL)     HCT  16.1  09.6 - 46.0 (%)     MCV  89.2  78.0 - 100.0 (fL)     MCH  31.0  26.0 - 34.0 (pg)     MCHC  34.8  30.0 - 36.0 (g/dL)     RDW  04.5  40.9 - 15.5 (%)     Platelets  314  150 - 400 (K/uL)    BASIC METABOLIC PANEL Status: Abnormal    Collection Time    03/14/11 7:05 AM   Component  Value  Range  Comment    Sodium  137  135 - 145 (mEq/L)     Potassium  4.0  3.5 - 5.1 (mEq/L)     Chloride  101  96 - 112 (mEq/L)     CO2  20  19 - 32 (mEq/L)     Glucose, Bld  128 (*)  70 - 99 (mg/dL)     BUN  13  6 - 23 (mg/dL)     Creatinine, Ser  8.11  0.50 - 1.10 (mg/dL)     Calcium  91.4  8.4 - 10.5 (mg/dL)     GFR calc non Af Amer  51 (*)  >90 (mL/min)     GFR calc Af Amer  59 (*)  >90 (mL/min)    LIPID PANEL Status: Abnormal    Collection Time    03/14/11 7:05 AM   Component  Value  Range  Comment    Cholesterol  191  0 - 200 (mg/dL)     Triglycerides  782  <150 (mg/dL)     HDL  50  >95 (mg/dL)     Total CHOL/HDL Ratio  3.8      VLDL  23  0 - 40 (mg/dL)     LDL Cholesterol  621 (*)  0 - 99 (mg/dL)    GLUCOSE, CAPILLARY Status: Abnormal    Collection Time    03/14/11 12:41 PM   Component  Value  Range  Comment    Glucose-Capillary  222 (*)  70 - 99 (mg/dL)    GLUCOSE, CAPILLARY Status: Abnormal    Collection Time    03/14/11 3:44 PM   Component  Value  Range  Comment    Glucose-Capillary  144 (*)  70 - 99 (mg/dL)    GLUCOSE, CAPILLARY Status: Abnormal    Collection Time    03/14/11 9:32 PM   Component  Value  Range  Comment    Glucose-Capillary  156 (*)  70 - 99 (mg/dL)     Comment 1  Documented in Chart      Comment 2  Notify RN     GLUCOSE, CAPILLARY Status: Abnormal    Collection Time    03/15/11 7:40 AM   Component  Value  Range  Comment    Glucose-Capillary  131 (*)  70 - 99 (mg/dL)     Comment 1   Documented in Chart      Comment 2  Notify RN     GLUCOSE, CAPILLARY Status:  Abnormal    Collection Time    03/15/11 11:16 AM   Component  Value  Range  Comment    Glucose-Capillary  229 (*)  70 - 99 (mg/dL)     Dg Chest 2 View  5/62/1308 *RADIOLOGY REPORT* Clinical Data: Stroke CHEST - 2 VIEW Comparison: Earlier film of the same day Findings: Coarse attenuated bronchovascular markings diffusely. No focal infiltrate. No effusion. Heart size upper limits normal. Tortuous atheromatous thoracic aorta. Regional bones unremarkable. IMPRESSION: 1. Borderline cardiomegaly and chronic changes. No acute disease. Original Report Authenticated By: Osa Craver, M.D.  Mr Brain Wo Contrast  03/13/2011 *RADIOLOGY REPORT* Clinical Data: Right-sided weakness with slurred speech. MRI HEAD WITHOUT CONTRAST Technique: Multiplanar, multiecho pulse sequences of the brain and surrounding structures were obtained according to standard protocol without intravenous contrast. Comparison: 03/13/2011 head CT. No comparison brain MR. Findings: The patient was only able to complete three sequences. Acute non hemorrhagic scattered small infarcts involving portions of the left sub insular region, left external capsule, posterior left corona radiata and left parietal lobe. No obvious intracranial hemorrhage. 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 Remote basal ganglia/mid corona radiata infarct. Moderate small vessel disease type changes. Global atrophy without hydrocephalus. Major intracranial vascular structures appear patent. IMPRESSION: The patient was only able to complete three sequences. Acute non hemorrhagic scattered small infarcts involving portions of the left sub insular region, left external capsule, posterior left corona radiata and left parietal lobe. 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 Remote basal ganglia/mid corona radiata infarct. Moderate small vessel disease type changes. Global atrophy without hydrocephalus. Original Report Authenticated By: Fuller Canada, M.D.   Post Admission Physician Evaluation:  1. Functional deficits secondary to left cortical/subcortical infarcts 2. Patient is admitted to receive collaborative, interdisciplinary care between the physiatrist, rehab nursing staff, and therapy team. 3. Patient's level of medical complexity and substantial therapy needs in context of that medical necessity cannot be provided at a lesser intensity of care such as a SNF. 4. Patient has experienced substantial functional loss from his/her baseline which was documented above under the "Functional History" and "Functional Status" headings. Judging by the patient's diagnosis, physical exam, and functional history, the patient has potential for functional progress which will result in measurable gains while on inpatient rehab. These gains will be of substantial and practical use upon discharge in facilitating mobility and self-care at the household level. 5. Physiatrist will provide 24 hour management of medical needs as well as oversight of the therapy plan/treatment and provide guidance as appropriate regarding the interaction of the two. 6. 24 hour rehab nursing will assist with bladder management, bowel management, safety, skin/wound care, disease management, medication administration, pain management and patient education and help integrate therapy concepts, techniques,education, etc. 7. PT will assess and treat for: Functional mobility, neuromuscular reeducation, adaptive equipment, pain management, safety, family education. Goals are: Supervision and minimal assistance. 8. OT will assess and treat for: Upper extremity strength, neuromuscular education, safety, ADLs, functional mobility. Goals are: Minimal to moderate assistance. 9. SLP will  assess and treat for: Speech intelligibility and swallowing and communication. Goals are: Supervision. 10. Case Management and Social Worker will assess and treat for psychological issues and discharge planning. 11. Team conference will be held weekly to assess progress toward goals and to determine barriers to discharge. 12. Patient will receive at least 3 hours  of therapy per day at least 5 days per week. 13. ELOS and Prognosis: 2-3 weeks excellent Medical Problem List and Plan:  1. DVT Prophylaxis/Anticoagulation: Pharmaceutical: Lovenox  2. Pain Management: use tylenol prn for LBP. Also has pain over the right first toe and metatarsals  3. Mood: motivated to get home  4. DM type 2: resume metformin bid. Continue SSI for tighter BS control.  6. Leukocytosis: WBC on steady trend upwards and some confusion reported. Check UA/UCS. UA and CXR yesterday negative. Monitor for fevers.  7. HTN: Monitor with bid checks. Continue lisinopril and coreg. Patient has had labile blood pressures lately. We'll adjust regimen as needed.  8. Dyslipidemia: Continue zocor daily.  9. wound care: Continue pressure relief and dressing as appropriate. These wounds are chronic.  Sherrilee Gilles.D.

## 2011-03-15 NOTE — Progress Notes (Signed)
INITIAL ADULT NUTRITION ASSESSMENT Date: 03/15/2011   Time: 10:01 AM Reason for Assessment: Dysphagia  ASSESSMENT: Female 78 y.o.  Dx: Stroke  Hx:  Past Medical History  Diagnosis Date  . Arthritis   . Diabetes mellitus   . Hypertension   . Heart murmur     Related Meds:     . carvedilol  12.5 mg Oral BID WC  . clopidogrel  75 mg Oral Q breakfast  . enoxaparin  40 mg Subcutaneous Q24H  . insulin aspart  0-5 Units Subcutaneous QHS  . insulin aspart  0-9 Units Subcutaneous TID WC  . lisinopril  40 mg Oral Daily  . LORazepam  1 mg Intravenous Once  . LORazepam  1 mg Intravenous Once  . magnesium sulfate 1 - 4 g bolus IVPB  1 g Intravenous Once  . simvastatin  20 mg Oral q1800  . general admission iv infusion   Intravenous Once  . DISCONTD: amLODipine  5 mg Oral Daily  . DISCONTD: aspirin EC  81 mg Oral Daily  . DISCONTD: magnesium sulfate 1 - 4 g bolus IVPB  2 g Intravenous Once    Ht: 5\' 2"  (157.5 cm)  Wt: 136 lb 3.2 oz (61.78 kg)  Ideal Wt: 50 kg % Ideal Wt: 124%  Usual Wt: 160 lbs 5/12 per family % Usual Wt: 85 %  Body mass index is 24.91 kg/(m^2).  Food/Nutrition Related Hx: family at bedside and report that pt had stroke like symptoms in 5/12 and c/o problems swallowing. She then began to lose weight (15% in 9 months). Pt was eating < 1/2 of what she normally consumed. When at home pt eats a regular diet, which includes high fat foods such as biscuits, fried foods, etc. Pt is being followed by SLP who continues trials of thin liquids. Pt currently on Dysphagia 3/Nectar diet. Pt does not like milk/ensure but does like ice cream.  Per son pt has not checked her blood sugar in a long time because she no longer has the strips to go with her monitor.  Pt meets criteria for moderate malnutrition in the context of chronic illness due to hx of at least <75% consumption of her estimated needs and at least 10% wt loss within the last 6 months.   Labs:  CMP     Component  Value Date/Time   NA 137 03/14/2011 0705   K 4.0 03/14/2011 0705   CL 101 03/14/2011 0705   CO2 20 03/14/2011 0705   GLUCOSE 128* 03/14/2011 0705   BUN 13 03/14/2011 0705   CREATININE 0.96 03/14/2011 0705   CALCIUM 10.1 03/14/2011 0705   PROT 7.2 03/13/2011 0820   ALBUMIN 3.7 03/13/2011 0820   AST 25 03/13/2011 0820   ALT 21 03/13/2011 0820   ALKPHOS 88 03/13/2011 0820   BILITOT 0.3 03/13/2011 0820   GFRNONAA 51* 03/14/2011 0705   GFRAA 59* 03/14/2011 0705   CBG (last 3)   Basename 03/15/11 0740 03/14/11 2132 03/14/11 1544  GLUCAP 131* 156* 144*   Lab Results  Component Value Date   HGBA1C 6.7* 03/14/2011    Intake/Output Summary (Last 24 hours) at 03/15/11 1002 Last data filed at 03/15/11 0600  Gross per 24 hour  Intake    110 ml  Output      0 ml  Net    110 ml     Diet Order: Dysphagia 2/Nectar Per pt she consumed < 50% of her Breakfast and did not drink the milk.  Supplements/Tube Feeding: none  IVF: NA  Estimated Nutritional Needs:   Kcal: 1350-1500 Protein: 60-75 grams Fluid: >1.5 L/day  NUTRITION DIAGNOSIS: -Inadequate oral intake (NI-2.1).  Status: Ongoing  RELATED TO: poor appetite  AS EVIDENCE BY: 15%wt loss PTA, <50% consumed at Breakfast  MONITORING/EVALUATION(Goals): Goal: Pt will meet >/= 90% of her estimated needs Monitor: po intake, weight, labs  EDUCATION NEEDS: No education needs identified at this time  INTERVENTION:  Offer magic cup bid  Dietitian #[phone]  DOCUMENTATION CODES Per approved criteria  -Non-severe (moderate) malnutrition in the context of chronic illness    Christina Pittman 03/15/2011, 10:01 AM

## 2011-03-15 NOTE — Evaluation (Signed)
Modified Barium Swallow Procedure Note Patient Details  Name: Christina Pittman MRN: [id number] Date of Birth: Nov 02, 1919  Today's Date: 03/15/2011 Time:  -    HPI:  78 yr old amitted with slurred speech and right sided weakness.  MRI revealed acute left infarct and old basal ganglia infarct.  Pt. continues to exhibit s/s aspiration with thin liquids, therefore, MBS recommended to fully assess oropharyngeal swallow function.  Past Medical History:  Past Medical History  Diagnosis Date  . Arthritis   . Diabetes mellitus   . Hypertension   . Heart murmur    Past Surgical History:  Past Surgical History  Procedure Date  . Abdominal hysterectomy    Recommendation/Prognosis  Clinical Impression Dysphagia Diagnosis: Severe oral phase dysphagia;Mild pharyngeal phase dysphagia;Moderate oral phase dysphagia Clinical impression: Pt. was sleepy when she arrived at the [facility] suite and became increasingly lethargic throughout evaluation and required tactile/verbal cues to increase alertness.  Pt. exhibited mod-severe oral dysphagia marked by bilateral and anterior sulci residue, anterior oral leakage, and delayed transit to posterior oral cavity.  Pharyngeal phase characterized by delayed swallow initiation to vallecule and vallecular residue due to either reduced tongue base retraction and/or lethargy at time of assessment.  Pt. was unable to manipulate and transit puree bolus which was suctioned from oral cavity and MBS ceased.  Difficult to assess pt.'s true swallow deficits versus due to currentl lethargic state.  Recommend pt. continue  Dys 2 diet and nectar thick liquids when alert.          Swallow Evaluation Recommendations Solid Consistency: Dysphagia 2 (Fine chop) Liquid Consistency: Nectar Liquid Administration via: Cup;No straw Medication Administration: Crushed with puree Supervision: Full supervision/cueing for compensatory strategies Compensations: Slow rate;Small sips/bites;Multiple dry  swallows after each bite/sip;Clear throat intermittently Postural Changes and/or Swallow Maneuvers: Seated upright 90 degrees Other Recommendations:  (Repeat MBS 1st of next week.) Follow up Recommendations:  (TBD) Prognosis Prognosis for Safe Diet Advancement: Fair Individuals Consulted Consulted and Agree with Results and Recommendations: Patient;Family member/caregiver  SLP Assessment/Plan   SLP Goals  SLP Swallowing Goals Patient will consume recommended diet without observed clinical signs of aspiration with: Minimal assistance Patient will utilize recommended strategies during swallow to increase swallowing safety with: Minimal assistance   Type of Study: Initial MBS Diet Prior to this Study: Dysphagia 2 (chopped);Nectar-thick liquids Respiratory Status: Room air Behavior/Cognition: Lethargic;Requires cueing Oral Cavity - Dentition: Edentulous Oral Motor / Sensory Function: Impaired - see Bedside swallow eval Vision: Functional for self-feeding Patient Positioning: Upright in chair Baseline Vocal Quality: Low vocal intensity Volitional Cough: Weak Volitional Swallow: Able to elicit Pharyngeal Secretions: Not observed secondary MBS  Oral Phase Oral Preparation/Oral Phase Oral Phase: Impaired Oral - Pudding Oral - Pudding Teaspoon: Left anterior bolus loss;Right anterior bolus loss;Holding of bolus;Pocketing in anterior sulcus;Delayed oral transit;Right pocketing in lateral sulci;Left pocketing in lateral sulci Oral - Thin Oral - Thin Teaspoon: Left anterior bolus loss;Right anterior bolus loss;Delayed oral transit;Left pocketing in lateral sulci;Right pocketing in lateral sulci;Holding of bolus;Pocketing in anterior sulcus Oral - Thin Cup: Left anterior bolus loss;Right anterior bolus loss;Holding of bolus;Right pocketing in lateral sulci;Left pocketing in lateral sulci;Delayed oral transit;Pocketing in anterior sulcus Pharyngeal Phase  Pharyngeal Phase Pharyngeal Phase:  Impaired Pharyngeal - Thin Pharyngeal - Thin Teaspoon: Reduced tongue base retraction;Pharyngeal residue - valleculae;Delayed swallow initiation;Premature spillage to valleculae Pharyngeal - Thin Cup: Delayed swallow initiation;Premature spillage to valleculae;Reduced tongue base retraction;Pharyngeal residue - valleculae Cervical Esophageal Phase  Cervical Esophageal Phase Cervical Esophageal Phase: [facility]  Breck Coons [place].Ed [employer] [phone]  03/15/2011

## 2011-03-15 NOTE — Discharge Summary (Signed)
Tel removed and placed at nursing station.  IV converted to SL.  Report called to Brett Canales on 4000.  Pt d/c'd via recliner to room 4038 for inpatient rehab.  All personal belongings sent with pt.  Daughter at side.  Pt assisted to bed and made comfortable. Pt and family denies any needs.

## 2011-03-15 NOTE — Plan of Care (Signed)
Overall Plan of Care [facility]) Patient Details Name: Christina Pittman MRN: [id number] DOB: 18-Jun-1919  Diagnosis:  Rehabilitation for stroke  Primary Diagnosis:   Left subcortical Cerebrovascular accident with right hemiparesis  Co-morbidities:  Arthritis  .  Diabetes mellitus  .  Hypertension  .  Heart murmur     Functional Problem List  Patient demonstrates impairments in the following areas: Balance, Bladder, Cognition, Endurance, Medication Management, Motor, Perception, Safety and Skin Integrity  Basic ADL's: eating, grooming, bathing, dressing and toileting Advanced ADL's: simple meal preparation  Transfers:  bed mobility, bed to chair, toilet, tub/shower, car, furniture and floor Locomotion:  ambulation and stairs  Additional Impairments:  Swallowing, memory, problem solving, awareness, attention   Anticipated Outcomes Item Anticipated Outcome  Eating/Swallowing  Supervision   Basic self-care  supervision  Tolieting  supervision  Bowel/Bladder  Modified Independent  Transfers  Supervision  Locomotion  Supervision with RW  Communication    Cognition  Supervision   Pain  N/A  Safety/Judgment  Supervision   Other     Therapy Plan: PT Frequency: 1-2 X/day, 60-90 minutes OT Frequency: 1-2 X/day, 60-90 minutes;5 out of 7 days SLP Frequency: 1-2 X/day, 30-60 minutes   Team Interventions: Item RN PT OT SLP SW TR Other  Self Care/Advanced ADL Retraining  x x      Neuromuscular Re-Education  x x      Therapeutic Activities  x x x     UE/LE Strength Training/ROM  x x      UE/LE Coordination Activities  x x      Visual/Perceptual Remediation/Compensation  x x      DME/Adaptive Equipment Instruction  x x      Therapeutic Exercise  x x      Balance/Vestibular Training  x x      Patient/Family Education X x x x     Cognitive Remediation/Compensation  x x x     Functional Mobility Training  x x      Ambulation/Gait Training  x       Stair Training  x         Wheelchair Propulsion/Positioning  x       Functional Statistician  x       Community Reintegration  x x      Dysphagia/Aspiration Printmaker  x  x     Speech/Language Facilitation         Bladder Management X        Bowel Management X        Disease Management/Prevention  x       Pain Management X        Medication Management X        Skin Care/Wound Management X x       Splinting/Orthotics  x       Discharge Planning  x x x x    Psychosocial Support X x x  x                       Team Discharge Planning: Destination:  Home Projected Follow-up:  PT, OT and Home Health, SLP Projected Equipment Needs:  RW platform attachment, RW if family's not fit Patient/family involved in discharge planning:  Yes  MD ELOS: 3 weeks Medical Rehab Prognosis:  Good Assessment: 78 year old female admitted with right hemiparesis, the patient had further workup demonstrating left subcortical infarct. Patient now requires CIR level PT, OT, SLP, 24 7 rehabilitation RN and MD.

## 2011-03-15 NOTE — H&P (Signed)
Physical Medicine and Rehabilitation Admission H&P    Chief Complaint  Patient presents with  . Weakness  : HPI:  MS. Christina Pittman 78 year old right-handed female admitted February 13 with right-sided weakness and slurred speech. MRI of the brain showed acute nonhemorrhagic scattered small infarcts involving portions of the left sub-insular region, left external capsule, posterior left corona radiata and left parietal lobe. 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 Carotid Dopplers with no significant internal carotid artery stenosis. Echocardiogram  done revealing moderate LVH with EF 65-70 % and moderate to severe aortic stenosis. .Neurology was consulted recommends continuing plavix  Therapy and aggressive risk factor control.  It was felt that infarcts most likely secondary to embolic source however not a Coumadin candidate due to fall risk. There is no current mention of plan for pituitary tumor identified for MRI. Speech therapy followup for dysphagia placed on dysphagia 2 nectar thick liquid diet. Patient with swallow difficulty for past 6 months with weight loss and ST feels patient likely with sensory and/or motor dysphagia.  Subcutaneous Lovenox was added for deep vein thrombosis prophylaxis.   Patient with cognitive deficits, decreased awareness of deficits and perseveration. Needs moderate VC for ADL tasks and to attend to RUE.   Review of Systems  HENT: Positive for hearing loss.   Respiratory: Negative for cough, sputum production and shortness of breath.   Cardiovascular: Negative for palpitations.  Gastrointestinal: Negative for heartburn and abdominal pain.  Genitourinary: Positive for frequency.       Incontinence new since admission.  Musculoskeletal: Positive for back pain and joint pain (right knee).    Neurological: Positive for sensory change, speech change and focal weakness. Negative for headaches.  Endo/Heme/Allergies: Bruises/bleeds easily.  Psychiatric/Behavioral: Negative for hallucinations. The patient is not nervous/anxious.   All other systems reviewed and are negative.   Past Medical History  Diagnosis Date  . Arthritis   . Diabetes mellitus   . Hypertension   . Heart murmur    Past Surgical History  Procedure Date  . Abdominal hysterectomy     Family history:   Cerebral hemorrhage- mother, brother, sister.  Social History:  Lives alone.  Independent without assistive device and still drives.  She reports that she has never smoked. She dips snuff- a box a week.. She reports that she does not drink alcohol or use illicit drugs.  Allergies:  Allergies  Allergen Reactions  . Betadine (Povidone Iodine) Hives  . Neosporin (Neomycin-Polymyx-Gramicid) Hives   Medications Prior to Admission  Medication Dose Route Frequency Provider Last Rate Last Dose  . acetaminophen (TYLENOL) tablet 650 mg  650 mg Oral Q4H PRN Vassie Loll, MD       Or  . acetaminophen (TYLENOL) suppository 650 mg  650 mg Rectal Q4H PRN Vassie Loll, MD      . aspirin chewable tablet 324 mg  324 mg Oral Once Peter A. Patrica Duel, MD   324 mg at 03/13/11 0906  . carvedilol (COREG) tablet 12.5 mg  12.5 mg Oral BID WC Vassie Loll, MD   12.5 mg at 03/15/11 0800  . clopidogrel (PLAVIX) tablet 75 mg  75 mg Oral Q breakfast Vassie Loll, MD   75 mg at 03/15/11 0800  . enoxaparin (LOVENOX) injection 40 mg  40 mg Subcutaneous Q24H Vassie Loll, MD   40 mg at 03/14/11 1343  .  food thickener (RESOURCE THICKENUP CLEAR) powder   Oral PRN Annie Main, NP      . hydrALAZINE (APRESOLINE) injection 10 mg  10 mg Intravenous Q8H PRN Vassie Loll, MD   10 mg at 03/14/11 0604  . insulin aspart (novoLOG) injection 0-5 Units  0-5 Units Subcutaneous QHS Vassie Loll, MD      . insulin aspart (novoLOG) injection 0-9 Units   0-9 Units Subcutaneous TID WC Vassie Loll, MD   1 Units at 03/15/11 0800  . lisinopril (PRINIVIL,ZESTRIL) tablet 40 mg  40 mg Oral Daily Vassie Loll, MD   40 mg at 03/15/11 1040  . LORazepam (ATIVAN) injection 1 mg  1 mg Intravenous Once Vassie Loll, MD   1 mg at 03/13/11 1307  . LORazepam (ATIVAN) injection 1 mg  1 mg Intravenous Once Vassie Loll, MD      . LORazepam (ATIVAN) injection 1 mg  1 mg Intravenous Once Vassie Loll, MD   0.5 mg at 03/14/11 1435  . magnesium sulfate IVPB 1 g 100 mL  1 g Intravenous Once Vassie Loll, MD   1 g at 03/14/11 1344  . ondansetron (ZOFRAN) injection 4 mg  4 mg Intravenous Q6H PRN Vassie Loll, MD      . senna-docusate (Senokot-S) tablet 1 tablet  1 tablet Oral QHS PRN Vassie Loll, MD      . simvastatin (ZOCOR) tablet 20 mg  20 mg Oral q1800 Vassie Loll, MD   20 mg at 03/14/11 2129  . sodium chloride 0.9 % 1,000 mL with thiamine 100 mg, folic acid 1 mg, multivitamins adult 10 mL infusion   Intravenous Once Vassie Loll, MD      . zolpidem Remus Loffler) tablet 5 mg  5 mg Oral QHS PRN Vassie Loll, MD   5 mg at 03/14/11 2126  . DISCONTD: amLODipine (NORVASC) tablet 5 mg  5 mg Oral Daily Vassie Loll, MD   5 mg at 03/14/11 1121  . DISCONTD: aspirin EC tablet 81 mg  81 mg Oral Daily Vassie Loll, MD   81 mg at 03/13/11 2000  . DISCONTD: aspirin suppository 300 mg  300 mg Rectal Daily Vassie Loll, MD      . DISCONTD: aspirin tablet 325 mg  325 mg Oral Daily Vassie Loll, MD      . DISCONTD: enoxaparin (LOVENOX) injection 40 mg  40 mg Subcutaneous Q24H Vassie Loll, MD      . DISCONTD: food thickener (THICK IT) powder   Oral PRN Annie Main, NP      . DISCONTD: labetalol (NORMODYNE,TRANDATE) injection 10 mg  10 mg Intravenous Once Peter A. Patrica Duel, MD      . DISCONTD: magnesium sulfate IVPB 2 g 50 mL  2 g Intravenous Once Vassie Loll, MD       Medications Prior to Admission  Medication Sig Dispense Refill  . acetaminophen (TYLENOL) 500 MG  tablet Take 500 mg by mouth every 6 (six) hours as needed. For pain      . aspirin EC 81 MG tablet Take 81 mg by mouth daily.       Marland Kitchen lisinopril (PRINIVIL,ZESTRIL) 10 MG tablet Take 10 mg by mouth daily.         Home: Home Living Lives With: Alone Receives Help From: Family Type of Home: House Home Layout: One level Home Access: Stairs to enter;Ramped entrance Entrance Stairs-Rails: Can reach both Entrance Stairs-Number of Steps: 1 Bathroom Shower/Tub: Health visitor: Handicapped height Bathroom Accessibility: Yes How Accessible: Accessible  via walker Home Adaptive Equipment: Walker - rolling;Quad cane;Grab bars around toilet;Grab bars in shower Additional Comments: Pt has family that leaves nearby and (A) daily. Pt son cooks breakfast everyday and checks on pt multiple times throughout the day.   Functional History: Prior Function Level of Independence: Independent with basic ADLs;Independent with homemaking with ambulation;Independent with gait;Independent with transfers Able to Take Stairs?: Yes Driving: Yes (drove 2 weeks ago) Vocation: Retired Comments: Pt's son educated on disable Camry car so that pt can not drive at this time. Pt demonstrates some visual deficits described below.  Functional Status:  Mobility: Bed Mobility Bed Mobility: Yes Supine to Sit: 3: Mod assist;HOB elevated (Comment degrees) (HOB 30 degrees) Supine to Sit Details (indicate cue type and reason): Pt initiating with Lt side of body. Pt pulling on mattress with Lt UE and long sitting. pt using abdominal muscles and Lt UE. Pt required Max (A) to bring rt side to EOB. Pt total (A) to scoot to EOB. Sit to Supine: 3: Mod assist;HOB flat ((A) for BIL LE) Transfers Transfers: Yes Sit to Stand: 2: Max assist;From bed Sit to Stand Details (indicate cue type and reason): Pt with LOB posteriorly with standing. Pt reaching for environmental support. Stand to Sit: 3: Mod assist;To  chair/3-in-1 Stand to Sit Details: pt sitting with Lt side and Rt side following Lt side incitiation. Ambulation/Gait Ambulation/Gait: Yes Ambulation/Gait Assistance: 3: Mod assist Ambulation/Gait Assistance Details (indicate cue type and reason): Mod assist for ambulation sceondary to safety and stabilization as well as assist to control RW. Pt unable to use RUE to control RW, therefore was drifting to the right. Will attempt hand held ambulation next session. VC for technique and sequencing Ambulation Distance (Feet): 20 Feet (distance limited by HR) Assistive device: Rolling walker Gait Pattern: Step-to pattern;Decreased step length - left;Decreased stance time - right;Decreased hip/knee flexion - right;Decreased dorsiflexion - right Gait velocity: Decreased gait speed Stairs: No    ADL: ADL Eating/Feeding: Simulated;Maximal assistance Eating/Feeding Details (indicate cue type and reason): Son reports (A) pt with breakfast this morning. Where Assessed - Eating/Feeding: Chair Grooming: Performed;Wash/dry hands;Maximal assistance Grooming Details (indicate cue type and reason): Pt required Max (A) for balance. Pt using Lt UE to place soap in Rt UE hand. pt required total (A) to supinate/ pronate. Pt with no active Rt UE participation with hand washing. Pt reports water as being "COLD" and warm water currently running over hand. Pt required mod v/c during task and v/c to attend to Rt UE. initially pt only trying to wash Lt UE. pt lack of awareness of Rt UE placement and decreased sensation. Where Assessed - Grooming: Standing at sink Lower Body Dressing: Performed;Maximal assistance Lower Body Dressing Details (indicate cue type and reason): don socks. Family in room reports pt doff socks independently in chair. Pt required Max (A) to cross LE and sock don partially. Pt able to pull sock from half don over heel to complete task on Rt LE only Where Assessed - Lower Body Dressing: Sitting,  chair;Supported Equipment Used:  (RW attempting initially but pt unable to grasp with Rt hand) Ambulation Related to ADLs: Pt required Max (A) with facilitation to weight shift to advance BIL LE. Pt with narrowed base of support. Pt advancing Lt LE and required (A) with Rt LE due to weakness. Pt HR 124 at highest during session. pt is not safe to use RW at this time. ADL Comments: Pt with balance deficits, lack of awareness of Rt UE location/  decreased sensation. Pt required (A) with bed mobility. Pt impulsive and reaching for RW with LT UE. pt required blocking to prevent LOB at EOB due to reaching to RW. Pt following simple commands 80% of session.  Cognition: Cognition Overall Cognitive Status: Appears within functional limits for tasks assessed Arousal/Alertness: Awake/alert Orientation Level: Oriented to person;Disoriented to time;Oriented to place;Disoriented to situation Cognition Arousal/Alertness: Awake/alert Overall Cognitive Status: Impaired Attention: Impaired Current Attention Level: Selective Memory: Appears impaired Memory Deficits: repeating same statements continously. Pt no recall of previous answer. Orientation Level: Oriented to person;Disoriented to time;Oriented to place;Disoriented to situation Following Commands: Follows one step commands inconsistently;Follows one step commands with increased time Safety/Judgement: Decreased awareness of safety precautions Decreased Safety/Judgement: Impulsive Safety/Judgement - Other Comments: pt attempting sit<>Stand and states "i am going home" Awareness of Errors: Decreased awareness of errors made Decreased Awareness of Errors: Assistance required to correct errors made Awareness of Deficits: Decreased awareness of deficits Problem Solving: Requires assistance for problem solving   Blood pressure 129/95, pulse 96, temperature 97.1 F (36.2 C), temperature source Oral, resp. rate 20, height 5\' 2"  (1.575 m), weight 61.78 kg  (136 lb 3.2 oz), SpO2 97.00%. Physical Exam  Nursing note and vitals reviewed. Constitutional: She is oriented to person, place, and time. She appears well-developed and well-nourished.  HENT:  Head: Normocephalic and atraumatic.  Mouth/Throat: Oropharynx is clear and moist.       adentulous  Eyes: Pupils are equal, round, and reactive to light.  Neck: Normal range of motion. Neck supple.  Cardiovascular: Normal rate.   Murmur heard.  Systolic murmur is present       murmur  Pulmonary/Chest: Effort normal and breath sounds normal.  Abdominal: Soft. Bowel sounds are normal.  Musculoskeletal: She exhibits tenderness.       Diffuse ecchymosis BUE.  Neurological: She is alert and oriented to person, place, and time. A cranial nerve deficit is present.        Moderately dysarthric speech. Poor phonation.  Follows basic commands without difficulty. Able to state date and DOB with minimal cues.  Right UE inattention. Right upper extremity is 1-2/5 proximal and trace to absent distally. Right lower extremity grossly 2+ to 3/5 proximal distal. Mild sensory loss but still can discriminate between pain and light touch on the right. Right central 7 and tongue deviation.   Skin:       B-shins with stasis changes.  Right great toe and 2-3 interdigital area with dry scabs - areas of prior ulcers. Healing ulcer over medial portion of the right first toe. Multiple areas of chronic skin changes in all 4 limbs. Sun-related changes over the face as well.    Results for orders placed during the hospital encounter of 03/13/11 (from the past 48 hour(s))  CBC     Status: Abnormal   Collection Time   03/13/11  1:06 PM      Component Value Range Comment   WBC 12.8 (*) 4.0 - 10.5 (K/uL)    RBC 4.66  3.87 - 5.11 (MIL/uL)    Hemoglobin 14.4  12.0 - 15.0 (g/dL)    HCT 16.1  09.6 - 04.5 (%)    MCV 89.7  78.0 - 100.0 (fL)    MCH 30.9  26.0 - 34.0 (pg)    MCHC 34.4  30.0 - 36.0 (g/dL)    RDW 40.9  81.1 - 91.4 (%)     Platelets 259  150 - 400 (K/uL)   URINE CULTURE  Status: Normal (Preliminary result)   Collection Time   03/13/11  4:32 PM      Component Value Range Comment   Specimen Description URINE, CLEAN CATCH      Special Requests NONE      Culture  Setup Time [id number]      Colony Count PENDING      Culture Culture reincubated for better growth      Report Status PENDING     URINALYSIS, ROUTINE W REFLEX MICROSCOPIC     Status: Abnormal   Collection Time   03/13/11  4:32 PM      Component Value Range Comment   Color, Urine YELLOW  YELLOW     APPearance CLEAR  CLEAR     Specific Gravity, Urine 1.008  1.005 - 1.030     pH 7.5  5.0 - 8.0     Glucose, UA NEGATIVE  NEGATIVE (mg/dL)    Hgb urine dipstick TRACE (*) NEGATIVE     Bilirubin Urine NEGATIVE  NEGATIVE     Ketones, ur NEGATIVE  NEGATIVE (mg/dL)    Protein, ur NEGATIVE  NEGATIVE (mg/dL)    Urobilinogen, UA 0.2  0.0 - 1.0 (mg/dL)    Nitrite NEGATIVE  NEGATIVE     Leukocytes, UA NEGATIVE  NEGATIVE    URINE MICROSCOPIC-ADD ON     Status: Normal   Collection Time   03/13/11  4:32 PM      Component Value Range Comment   Squamous Epithelial / LPF RARE  RARE     WBC, UA 0-2  <3 (WBC/hpf)    RBC / HPF 0-2  <3 (RBC/hpf)   GLUCOSE, CAPILLARY     Status: Abnormal   Collection Time   03/13/11 11:29 PM      Component Value Range Comment   Glucose-Capillary 130 (*) 70 - 99 (mg/dL)   GLUCOSE, CAPILLARY     Status: Abnormal   Collection Time   03/14/11  6:55 AM      Component Value Range Comment   Glucose-Capillary 139 (*) 70 - 99 (mg/dL)   HEMOGLOBIN W1X     Status: Abnormal   Collection Time   03/14/11  7:05 AM      Component Value Range Comment   Hemoglobin A1C 6.7 (*) <5.7 (%)    Mean Plasma Glucose 146 (*) <117 (mg/dL)   CBC     Status: Abnormal   Collection Time   03/14/11  7:05 AM      Component Value Range Comment   WBC 16.2 (*) 4.0 - 10.5 (K/uL)    RBC 4.90  3.87 - 5.11 (MIL/uL)    Hemoglobin 15.2 (*) 12.0 - 15.0 (g/dL)     HCT 91.4  78.2 - 95.6 (%)    MCV 89.2  78.0 - 100.0 (fL)    MCH 31.0  26.0 - 34.0 (pg)    MCHC 34.8  30.0 - 36.0 (g/dL)    RDW 21.3  08.6 - 57.8 (%)    Platelets 314  150 - 400 (K/uL)   BASIC METABOLIC PANEL     Status: Abnormal   Collection Time   03/14/11  7:05 AM      Component Value Range Comment   Sodium 137  135 - 145 (mEq/L)    Potassium 4.0  3.5 - 5.1 (mEq/L)    Chloride 101  96 - 112 (mEq/L)    CO2 20  19 - 32 (mEq/L)    Glucose, Bld 128 (*) 70 - 99 (mg/dL)  BUN 13  6 - 23 (mg/dL)    Creatinine, Ser 4.09  0.50 - 1.10 (mg/dL)    Calcium 81.1  8.4 - 10.5 (mg/dL)    GFR calc non Af Amer 51 (*) >90 (mL/min)    GFR calc Af Amer 59 (*) >90 (mL/min)   LIPID PANEL     Status: Abnormal   Collection Time   03/14/11  7:05 AM      Component Value Range Comment   Cholesterol 191  0 - 200 (mg/dL)    Triglycerides 914  <150 (mg/dL)    HDL 50  >78 (mg/dL)    Total CHOL/HDL Ratio 3.8      VLDL 23  0 - 40 (mg/dL)    LDL Cholesterol 295 (*) 0 - 99 (mg/dL)   GLUCOSE, CAPILLARY     Status: Abnormal   Collection Time   03/14/11 12:41 PM      Component Value Range Comment   Glucose-Capillary 222 (*) 70 - 99 (mg/dL)   GLUCOSE, CAPILLARY     Status: Abnormal   Collection Time   03/14/11  3:44 PM      Component Value Range Comment   Glucose-Capillary 144 (*) 70 - 99 (mg/dL)   GLUCOSE, CAPILLARY     Status: Abnormal   Collection Time   03/14/11  9:32 PM      Component Value Range Comment   Glucose-Capillary 156 (*) 70 - 99 (mg/dL)    Comment 1 Documented in Chart      Comment 2 Notify RN     GLUCOSE, CAPILLARY     Status: Abnormal   Collection Time   03/15/11  7:40 AM      Component Value Range Comment   Glucose-Capillary 131 (*) 70 - 99 (mg/dL)    Comment 1 Documented in Chart      Comment 2 Notify RN     GLUCOSE, CAPILLARY     Status: Abnormal   Collection Time   03/15/11 11:16 AM      Component Value Range Comment   Glucose-Capillary 229 (*) 70 - 99 (mg/dL)    Dg Chest 2  View  07/18/3084  *RADIOLOGY REPORT*  Clinical Data: Stroke  CHEST - 2 VIEW  Comparison: Earlier film of the same day  Findings: Coarse attenuated bronchovascular markings diffusely.  No focal infiltrate.  No effusion.  Heart size upper limits normal. Tortuous atheromatous thoracic aorta.  Regional bones unremarkable.  IMPRESSION:  1.  Borderline cardiomegaly and chronic changes.  No acute disease.  Original Report Authenticated By: Osa Craver, M.D.   Mr Brain Wo Contrast  03/13/2011  *RADIOLOGY REPORT*  Clinical Data: Right-sided weakness with slurred speech.  MRI HEAD WITHOUT CONTRAST  Technique:  Multiplanar, multiecho pulse sequences of the brain and surrounding structures were obtained according to standard protocol without intravenous contrast.  Comparison: 03/13/2011 head CT.  No comparison brain MR.  Findings: The patient was only able to complete three sequences.  Acute non hemorrhagic scattered small infarcts involving portions of the left sub insular region, left external capsule, posterior left corona radiata and left parietal lobe.  No obvious intracranial hemorrhage.  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  Remote basal ganglia/mid corona radiata infarct.  Moderate small vessel disease type changes.  Global atrophy without hydrocephalus.  Major intracranial vascular structures appear patent.  IMPRESSION: The patient was only able to complete three sequences.  Acute non hemorrhagic  scattered small infarcts involving portions of the left sub insular region, left external capsule, posterior left corona radiata and left parietal lobe.  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  Remote basal ganglia/mid corona radiata infarct.  Moderate small vessel disease type changes.  Global atrophy without hydrocephalus.  Original Report Authenticated By:  Fuller Canada, M.D.    Post Admission Physician Evaluation: 1. Functional deficits secondary  to left cortical/subcortical infarcts 2. Patient is admitted to receive collaborative, interdisciplinary care between the physiatrist, rehab nursing staff, and therapy team. 3. Patient's level of medical complexity and substantial therapy needs in context of that medical necessity cannot be provided at a lesser intensity of care such as a SNF. 4. Patient has experienced substantial functional loss from his/her baseline which was documented above under the "Functional History" and "Functional Status" headings.  Judging by the patient's diagnosis, physical exam, and functional history, the patient has potential for functional progress which will result in measurable gains while on inpatient rehab.  These gains will be of substantial and practical use upon discharge  in facilitating mobility and self-care at the household level. 5. Physiatrist will provide 24 hour management of medical needs as well as oversight of the therapy plan/treatment and provide guidance as appropriate regarding the interaction of the two. 6. 24 hour rehab nursing will assist with bladder management, bowel management, safety, skin/wound care, disease management, medication administration, pain management and patient education  and help integrate therapy concepts, techniques,education, etc. 7. PT will assess and treat for:  Functional mobility, neuromuscular reeducation, adaptive equipment, pain management, safety, family education.  Goals are: Supervision and minimal assistance. 8. OT will assess and treat for: Upper extremity strength, neuromuscular education, safety, ADLs, functional mobility.   Goals are: Minimal to moderate assistance. 9. SLP will assess and treat for: Speech intelligibility and swallowing and communication.  Goals are: Supervision. 10. Case Management and Social Worker will assess and treat for psychological issues  and discharge planning. 11. Team conference will be held weekly to assess progress toward goals and to determine barriers to discharge. 12.  Patient will receive at least 3 hours of therapy per day at least 5 days per week. 13. ELOS and Prognosis: 2-3 weeks excellent   Medical Problem List and Plan: 1. DVT Prophylaxis/Anticoagulation: Pharmaceutical: Lovenox 2. Pain Management: use tylenol prn for LBP. Also has pain over the right first toe and metatarsals 3. Mood: motivated to get home 4. DM type 2: resume metformin bid.  Continue SSI for tighter BS control. 6. Leukocytosis: WBC on steady trend upwards and some confusion reported. Check UA/UCS. UA and CXR yesterday negative.  Monitor for fevers. 7. HTN:  Monitor with bid checks.  Continue lisinopril and coreg. Patient has had labile blood pressures lately. We'll adjust regimen as needed. 8. Dyslipidemia:  Continue zocor daily. 9. wound care: Continue pressure relief and dressing as appropriate. These wounds are chronic.  Ranelle Oyster M.D. 03/15/2011, 12:14 PM

## 2011-03-15 NOTE — Progress Notes (Signed)
Rehab admissions - Evaluated for possible admission.  I spoke with patient and her 3 adult children.  All are in agreement to inpatient rehab.  Patient hopes to be able to go home in a week.  Bed available and can admit to inpatient rehab today.  Pager [phone]

## 2011-03-15 NOTE — Progress Notes (Signed)
  Echocardiogram 2D Echocardiogram has been performed.  Christina Pittman 03/15/2011, 9:56 AM

## 2011-03-16 DIAGNOSIS — Z5189 Encounter for other specified aftercare: Secondary | ICD-10-CM

## 2011-03-16 DIAGNOSIS — I6340 Cerebral infarction due to embolism of unspecified cerebral artery: Secondary | ICD-10-CM

## 2011-03-16 LAB — URINALYSIS, ROUTINE W REFLEX MICROSCOPIC
Hgb urine dipstick: NEGATIVE
Protein, ur: NEGATIVE mg/dL
Urobilinogen, UA: 0.2 mg/dL (ref 0.0–1.0)

## 2011-03-16 LAB — GLUCOSE, CAPILLARY
Glucose-Capillary: 145 mg/dL — ABNORMAL HIGH (ref 70–99)
Glucose-Capillary: 161 mg/dL — ABNORMAL HIGH (ref 70–99)

## 2011-03-16 LAB — URINE CULTURE

## 2011-03-16 NOTE — Progress Notes (Signed)
Patient ID: Christina Pittman, female   DOB: [birth year], 78 y.o.   MRN: [id number] Subjective/Complaints: Review of Systems  Neurological: Positive for sensory change and focal weakness.  All other systems reviewed and are negative.  slept well. Decreased urinary output over night 2/16   Objective: Vital Signs: Blood pressure 152/78, pulse 77, temperature 97.7 F (36.5 C), temperature source Oral, resp. rate 17, SpO2 94.00%. Dg Swallowing Func-no Report  03/15/2011  CLINICAL DATA: dysphagia   FLUOROSCOPY FOR SWALLOWING FUNCTION STUDY:  Fluoroscopy was provided for swallowing function study, which was  administered by a speech pathologist.  Final results and recommendations  from this study are contained within the speech pathology report.      Basename 03/14/11 0705 03/13/11 1306  WBC 16.2* 12.8*  HGB 15.2* 14.4  HCT 43.7 41.8  PLT 314 259    Basename 03/14/11 0705  NA 137  K 4.0  CL 101  CO2 20  GLUCOSE 128*  BUN 13  CREATININE 0.96  CALCIUM 10.1   CBG (last 3)   Basename 03/16/11 0748 03/15/11 2139 03/15/11 1722  GLUCAP 145* 119* 169*    Wt Readings from Last 3 Encounters:  03/13/11 61.78 kg (136 lb 3.2 oz)  10/28/10 63.957 kg (141 lb)    Physical Exam:  General appearance: alert, cooperative and no distress Head: Normocephalic, without obvious abnormality, atraumatic Eyes: conjunctivae/corneas clear. PERRL, EOM's intact. Fundi benign. Ears: normal TM's and external ear canals both ears Nose: Nares normal. Septum midline. Mucosa normal. No drainage or sinus tenderness. Throat: lips, mucosa, and tongue normal; teeth and gums normal Neck: no adenopathy, no carotid bruit, no JVD, supple, symmetrical, trachea midline and thyroid not enlarged, symmetric, no tenderness/mass/nodules Back: symmetric, no curvature. ROM normal. No CVA tenderness. Resp: clear to auscultation bilaterally Cardio: regular rate and rhythm, S1, S2 normal, no murmur, click, rub or gallop and normal  apical impulse GI: soft, non-tender; bowel sounds normal; no masses,  no organomegaly Extremities: extremities normal, atraumatic, no cyanosis or edema Pulses: 2+ and symmetric Skin: Skin color, texture, turgor normal. No rashes or lesions Neurologic: very alert.  Speech clearer today. RUE 2-3/5 RLE 3+/5. Sensory 1/2 on right.  Mild right 7 and 12.  cognitvely very alert. Incision/Wound: chronic skin changes and chronic wounds on right foot 2/16 exam  Assessment/Plan: 1. Functional deficits secondary to left cortical/subcortical infarcts which require 3+ hours per day of interdisciplinary therapy in a comprehensive inpatient rehab setting. Physiatrist is providing close team supervision and 24 hour management of active medical problems listed below. Physiatrist and rehab team continue to assess barriers to discharge/monitor patient progress toward functional and medical goals. FIM:          FIM - Diplomatic Services operational officer Devices: Bedside commode Toilet Transfers: 2-To toilet/BSC: Max A (lift and lower assist)        Comprehension Comprehension Mode: Auditory Comprehension: 5-Follows basic conversation/direction: With extra time/assistive device  Expression Expression Mode: Verbal Expression: 5-Expresses basic 90% of the time/requires cueing < 10% of the time.  Social Interaction Social Interaction: 5-Interacts appropriately 90% of the time - Needs monitoring or encouragement for participation or interaction.  Problem Solving Problem Solving: 4-Solves basic 75 - 89% of the time/requires cueing 10 - 24% of the time  Memory Memory: 3-Recognizes or recalls 50 - 74% of the time/requires cueing 25 - 49% of the time  1. DVT Prophylaxis/Anticoagulation: Pharmaceutical: Lovenox  2. Pain Management: use tylenol prn for LBP. Also has pain over the right  first toe and metatarsals. Protect skin 3. Mood: motivated to get home  4. DM type 2: resumed metformin bid.  Continue SSI for tighter BS control. Follow for pattern. 6. Leukocytosis: WBC on steady trend upwards and some confusion reported. ua negative and CXR yesterday negative ucx pending.. Monitor for fevers.  7. HTN: Monitor with bid checks. Continue lisinopril and coreg. Patient has had labile blood pressures lately. We'll adjust regimen as needed. Better control thus far. 8. Dyslipidemia: Continue zocor daily.  9. wound care: Continue pressure relief and dressing as appropriate. These wounds are chronic.    LOS (Days) 1 A FACE TO FACE EVALUATION WAS PERFORMED  Christina Pittman 03/16/2011, 8:43 AM

## 2011-03-16 NOTE — Progress Notes (Signed)
Physical Therapy Note  Patient Details  Name: Odis Wickey MRN: [id number] Date of Birth: [birth year] Today's Date: 03/16/2011  1400-1425  (25 minutes) individual Pain- no complaint of pain; pt c/o fatigue Focus of treatment: Neuro reed RT LE Treatment: Transfer- stand/pivot min assist; sit to supine mod assist bilateral LEs; AA hip RT hip flexion/extension/ abduction, active SAQs X 10 in supine. Pt. Very fatigued this PM limiting participation.   Jeremie Giangrande,JIM 03/16/2011, 2:17 PM

## 2011-03-16 NOTE — Evaluation (Signed)
Speech Language Pathology Assessment and Plan & Bedside Swallow Evaluation   Patient Details  Name: Christina Pittman MRN: [id number] Date of Birth: 07-Jun-1919  SLP Diagnosis: cognitive impairment  Rehab Potential: Good ELOS: TBD   Today's Date: 03/16/2011 Time: 1300-1355 Time Calculation (min): 55 min  Therapeutic Intervention: Administered cognitive-linguistic evaluation and BSE. Please see below for details.   Problem List:  Patient Active Problem List  Diagnoses  . Stroke  . HTN (hypertension)  . DM (diabetes mellitus screen)  . DM2 (diabetes mellitus, type 2)  . Insomnia  . Hemiparesis, right  . HLD (hyperlipidemia)  . Dysphagia    Past Medical History:  Past Medical History  Diagnosis Date  . Arthritis   . Diabetes mellitus   . Hypertension   . Heart murmur    Past Surgical History:  Past Surgical History  Procedure Date  . Abdominal hysterectomy     Assessment & Plan Clinical Impression: 78 year old right-handed female admitted February 13 with right-sided weakness and slurred speech. MRI of the brain showed acute nonhemorrhagic scattered small infarcts involving portions of the left sub-insular region, left external capsule, posterior left corona radiata and left parietal lobe. 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 Carotid Dopplers with no significant internal carotid artery stenosis. Echocardiogram done revealing moderate LVH with EF 65-70 % and moderate to severe aortic stenosis. Neurology was consulted recommends continuing plavix Therapy and aggressive risk factor control. It was felt that infarcts most likely secondary to embolic source however not a Coumadin candidate due to fall risk. There is no current mention of plan for pituitary tumor identified for MRI. Pt had MBSS on 03/15/11 and  recommended a Dys. 2 diet with nectar thick liquids. Pt transferred to CIR 03/15/11 and presents with moderate cognitive impairments characterized by impaired working memory, intellectual awareness, functional problem solving and sustained attention. Pt also presents with a moderate oral-pharyngeal dysphagia and is recommended to continue with current diet. Pt would benefit from skilled SLP services to maximize cognitive and swallowing function and overall independence for discharge home.    SLP - End of Session Patient left: in chair;with call bell in reach Nurse Communication: Diet recommendation Assessment SLP Recommendation/Assessment: Patient will need skilled Speech Lanaguage Pathology Services during CIR admission Rehab Potential: Good Barriers to Discharge: None Therapy Diagnosis: Cognitive Impairments SLP Plan SLP Frequency: 1-2 X/day, 30-60 minutes Estimated Length of Stay: TBD SLP Treatment/Interventions: Cognitive remediation/compensation Recommendation Follow up Recommendations: Other (comment) (TBD) Equipment Recommended: None recommended by SLP  SLP Evaluation Pain Pain Assessment Pain Assessment: No/denies pain Cognition Overall Cognitive Status: Impaired Arousal/Alertness: Lethargic Orientation Level: Oriented X4 Attention: Sustained Sustained Attention: Impaired Sustained Attention Impairment: Verbal basic;Functional basic Memory: Impaired Memory Impairment: Decreased recall of new information;Decreased short term memory Decreased Short Term Memory: Verbal basic;Functional basic Awareness: Impaired Awareness Impairment: Emergent impairment;Anticipatory impairment;Intellectual impairment Problem Solving: Impaired Problem Solving Impairment: Verbal basic;Functional basic Executive Function: Organizing;Self Monitoring;Self Correcting;Decision Making Organizing: Impaired Organizing Impairment: Verbal basic;Functional basic Decision Making: Impaired Decision  Making Impairment: Verbal basic;Functional basic Self Monitoring: Impaired Self Monitoring Impairment: Verbal basic;Functional basic Self Correcting: Impaired Self Correcting Impairment: Verbal basic;Functional basic Behaviors: Impulsive Safety/Judgment: Impaired Comprehension Auditory Comprehension Overall Auditory Comprehension: Impaired Yes/No Questions: Within Functional Limits Commands: Impaired Two Step Basic Commands: 0-24% accurate Multistep Basic Commands: 0-24% accurate Conversation: Simple Interfering Components: Attention (lethargy) EffectiveTechniques: Extra  processing time;Repetition Visual Recognition/Discrimination Discrimination: Within Function Limits Reading Comprehension Reading Status: Not tested Expression Expression Primary Mode of Expression: Verbal Verbal Expression Overall Verbal Expression: Impaired Initiation: No impairment Automatic Speech: Name;Social Response Level of Generative/Spontaneous Verbalization: Sentence Repetition: No impairment Naming: No impairment Confrontation: Within functional limits Pragmatics: No impairment Interfering Components: Attention Written Expression Dominant Hand: Right Written Expression: Not tested Oral/Motor Oral Motor/Sensory Function Overall Oral Motor/Sensory Function: Impaired Labial ROM: Within Functional Limits Labial Symmetry: Abnormal symmetry right Lingual ROM: Reduced right Lingual Symmetry: Abnormal symmetry right Mandible: Within Functional Limits Motor Speech Overall Motor Speech: Appears within functional limits for tasks assessed Respiration: Within functional limits Phonation: Normal Resonance: Within functional limits Articulation: Within functional limitis Intelligibility: Intelligible Motor Planning: Witnin functional limits Motor Speech Errors: Not applicable  See FIM for current functional status Refer to Care Plan for Long Term Goals  Recommendations for other services:  None  Discharge Criteria: Patient will be discharged from SLP if patient refuses treatment 3 consecutive times without medical reason, if treatment goals not met, if there is a change in medical status, if patient makes no progress towards goals or if patient is discharged from hospital.  The above assessment, treatment plan, treatment alternatives and goals were discussed and mutually agreed upon: by patient     Clinical/Bedside Swallow Evaluation Patient Details  Name: Christina Pittman MRN: [id number] DOB: [birth year] Today's Date: 03/16/2011  Past Medical History:  Past Medical History  Diagnosis Date  . Arthritis   . Diabetes mellitus   . Hypertension   . Heart murmur    Past Surgical History:  Past Surgical History  Procedure Date  . Abdominal hysterectomy    HPI:  Pt transferred to CIR 03/16/11. BSE today to assess safety and tolerance of current diet of Dys. 2 textures and nectar thick liqudis.    Assessment/Recommendations/Treatment Plan  SLP Assessment Clinical Impression Statement: Pt had MBS 03/15/11 and recommeded Dys. 2 textures and nectar-thick liquds. Pt tolerating current diet without overt s/s of aspiration and needs Min A verbal cues to utilize swallowing compensatory strategies.  Risk for Aspiration: Moderate Other Related Risk Factors: Cognitive impairment  Swallow Evaluation Recommendations Solid Consistency: Dysphagia 2 (Fine chop) Liquid Consistency: Nectar Liquid Administration via: Cup;No straw Medication Administration: Whole meds with puree Supervision: Patient able to self feed;Full supervision/cueing for compensatory strategies Compensations: Slow rate;Small sips/bites;Multiple dry swallows after each bite/sip;Clear throat intermittently Postural Changes and/or Swallow Maneuvers: Seated upright 90 degrees Oral Care Recommendations: Oral care BID Other Recommendations:  (Repeat MBSS once pt demonstrates increased arousal ) Follow up Recommendations:   (TBD)  Treatment Plan Treatment Plan Recommendations: Therapy as outlined in treatment plan below Speech Therapy Frequency: min 5x/week Treatment Duration: 2 weeks Interventions: Aspiration precaution training;Trials of upgraded texture/liquids;Diet toleration management by SLP;Compensatory techniques;Patient/family education;Group dysphagia treatment  Prognosis Prognosis for Safe Diet Advancement: Fair Barriers to Reach Goals: Cognitive deficits  Individuals Consulted Consulted and Agree with Results and Recommendations: Patient  Swallowing Goals  SLP Swallowing Goals Patient will consume recommended diet without observed clinical signs of aspiration with: Supervision/safety Patient will utilize recommended strategies during swallow to increase swallowing safety with: Minimal assistance  Swallow Study  General  Date of Onset: 03/13/11 HPI: Pt transferred to [facility] 03/16/11. BSE today to assess safety and tolerance of current diet of Dys. 2 textures and nectar thick liqudis.  Type of Study: Bedside swallow evaluation Diet Prior to this Study: Dysphagia 2 (chopped);Nectar-thick liquids Temperature Spikes Noted: No Respiratory Status: Room air Behavior/Cognition: Lethargic  Oral Cavity - Dentition: Edentulous Vision: Functional for self-feeding Patient Positioning: Upright in chair Baseline Vocal Quality: Clear Volitional Cough: Strong Volitional Swallow: Able to elicit  Oral Motor/Sensory Function  Overall Oral Motor/Sensory Function: Impaired Labial ROM: Within Functional Limits Labial Symmetry: Abnormal symmetry right Lingual ROM: Reduced right Lingual Symmetry: Abnormal symmetry right Mandible: Within Functional Limits  Consistency Results  Ice Chips Ice chips: Not tested  Thin Liquid Thin Liquid: Not tested  Nectar Thick Liquid Nectar Thick Liquid: Within functional limits Presentation: Cup  Honey Thick Liquid Honey Thick Liquid: Not tested  Puree Puree:  Within functional limits  Solid Solid: Not tested Other Comments: Pt refused Dys. 2 textures during BSE (lunch tray).   Individual Therapy    Michell Giuliano 03/16/2011,2:31 PM      Rileyann Florance 03/16/2011, 2:31 PM  .

## 2011-03-16 NOTE — Evaluation (Addendum)
Physical Therapy Assessment and Plan  Patient Details  Name: Christina Pittman MRN: [id number] Date of Birth: June 30, 1919  PT Diagnosis: Abnormal posture, Abnormality of gait, Hemiparesis dominant, Impaired cognition and Muscle weakness Rehab Potential: Good ELOS: 3 weeks   Today's Date: 03/16/2011 Time:  -     Problem List:  Patient Active Problem List  Diagnoses  . Stroke  . HTN (hypertension)  . DM (diabetes mellitus screen)  . DM2 (diabetes mellitus, type 2)  . Insomnia  . Hemiparesis, right  . HLD (hyperlipidemia)  . Dysphagia    Past Medical History:  Past Medical History  Diagnosis Date  . Arthritis   . Diabetes mellitus   . Hypertension   . Heart murmur    Past Surgical History:  Past Surgical History  Procedure Date  . Abdominal hysterectomy     Assessment & Plan Clinical Impression: Christina Pittman 78 year old right-handed female admitted February 13 with right-sided weakness and slurred speech. MRI of the brain showed acute nonhemorrhagic scattered small infarcts involving portions of the left sub-insular region, left external capsule, posterior left corona radiata and left parietal lobe. 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 Carotid Dopplers with no significant internal carotid artery stenosis. Echocardiogram done revealing moderate LVH with EF 65-70 % and moderate to severe aortic stenosis. .Neurology was consulted recommends continuing plavix Therapy and aggressive risk factor control. It was felt that infarcts most likely secondary to embolic source however not a Coumadin candidate due to fall risk. There is no current mention of plan for pituitary tumor identified for MRI. Speech therapy followup for dysphagia placed on dysphagia 2 nectar thick liquid diet. Patient with swallow difficulty for past 6  months with weight loss and ST feels patient likely with sensory and/or motor dysphagia. Subcutaneous Lovenox was added for deep vein thrombosis prophylaxis. Patient with cognitive deficits, decreased awareness of deficits and perseveration. Needs moderate VC for ADL tasks and to attend to RUE.   Patient currently requires max with mobility secondary to impaired timing and sequencing, unbalanced muscle activation and motor apraxia.  Prior to hospitalization, patient was supervision with mobility and lived with Alone in a House home.  Home access is 1Stairs to enter;Ramped entrance.  Patient will benefit from skilled PT intervention to maximize safe functional mobility, minimize fall risk and decrease caregiver burden for planned discharge home with 24 hour assist.  Anticipate patient will benefit from follow up [facility] at discharge.  PT - End of Session Activity Tolerance: Tolerates 10 - 20 min activity with multiple rests Endurance Deficit: Yes Endurance Deficit Description: Pt fatigued and needing to return to bed at end of tx PT Assessment Rehab Potential: Good Barriers to Discharge: None PT Plan PT Frequency: 1-2 X/day, 60-90 minutes Estimated Length of Stay: 3 weeks PT Treatment/Interventions: Ambulation/gait training;Balance/vestibular training;Cognitive remediation/compensation;Community reintegration;DME/adaptive equipment instruction;Functional mobility training;Neuromuscular re-education;Patient/family education;Stair training;Therapeutic Exercise;Therapeutic Activities;UE/LE Strength taining/ROM;Visual/perceptual remediation/compensation;Wheelchair propulsion/positioning PT Recommendation Follow Up Recommendations: Home health PT;24 hour supervision/assistance Equipment Recommended: None recommended by PT (If pt's RW is proper fit)  PT Evaluation Precautions/Restrictions Precautions Precautions: Fall Precaution Comments: impulsive Required Braces or Orthoses: No Restrictions Weight  Bearing Restrictions: No  Vital Signs Therapy Vitals Temp: 97.7 F (36.5 C) Temp src: Oral Pulse Rate: 77  Resp: 17  BP: 152/78 mmHg Patient Position, if appropriate: Lying Oxygen Therapy SpO2: 94 % O2 Device:  None (Room air) Pain Pain Assessment Pain Assessment: No/denies pain Home Living/Prior Functioning Home Living Lives With: Alone Receives Help From: Family Type of Home: House Home Layout: One level Home Access: Stairs to enter;Ramped entrance Entrance Stairs-Rails: Can reach both Entrance Stairs-Number of Steps: 1 Bathroom Shower/Tub: Health visitor: Handicapped height Bathroom Accessibility: Yes How Accessible: Accessible via walker Home Adaptive Equipment: Walker - rolling;Quad cane;Grab bars around toilet;Grab bars in shower Additional Comments: Pt has family that leaves nearby and (A) daily. Pt son cooks breakfast everyday and checks on pt multiple times throughout the day. He works days, but other family around are available Prior Function Level of Independence: Independent with basic ADLs;Independent with homemaking with ambulation;Independent with gait;Independent with transfers Able to Take Stairs?: Yes Driving: Yes Vocation: Retired Engineer, site: [employer] Praxis Praxis: Impaired Praxis Impairment Details: Ideomotor;Motor planning  Cognition Overall Cognitive Status: Appears within functional limits for tasks assessed, Arousal/Alertness: Awake/alert Orientation Level: Oriented to person;Oriented to place;Oriented to time Awareness: Impaired Awareness Impairment: Anticipatory impairment;Emergent impairment Problem Solving: Impaired Problem Solving Impairment: Functional basic;Functional complex - cues needed for RUE management/safety Behaviors: Impulsive Safety/Judgment: Impaired Comments: Pt needs cues for safety with functional mobility. Sensation Sensation Light Touch: Impaired Detail  (LE intact 90% except bottom of feet) Light Touch Impaired Details: Impaired RUE;Impaired RLE Proprioception: Impaired Detail Proprioception Impaired Details: Impaired RLE;Impaired LLE Coordination Gross Motor Movements are Fluid and Coordinated: No Fine Motor Movements are Fluid and Coordinated: No Coordination and Movement Description: LE coordination limited by weakness R>L Heel Shin Test: impaired L on R Motor  Motor Motor: Hemiplegia;Motor apraxia Motor - Skilled Clinical Observations: Pt with decreased motor activiation in R UE/LE  Mobility Bed Mobility Bed Mobility: Yes Rolling Left: 4: Min assist Rolling Left Details: Tactile cues for initiation;Visual cues/gestures for sequencing;Verbal cues for sequencing;Verbal cues for technique;Manual facilitation for placement;Manual facilitation for weight shifting Left Sidelying to Sit: 3: Mod assist;HOB flat Left Sidelying to Sit Details: Verbal cues for technique;Verbal cues for sequencing;Visual cues/gestures for sequencing;Manual facilitation for weight shifting Sit to Supine: 4: Min assist;HOB flat Sit to Supine - Details: Verbal cues for sequencing;Verbal cues for technique Scooting to HOB: 3: Mod assist Scooting to [facility] Details: Tactile cues for placement;Manual facilitation for weight shifting;Verbal cues for sequencing;Verbal cues for technique Transfers Transfers: Yes Sit to Stand: 2: Max assist;With upper extremity assist;From bed;From chair/3-in-1;With armrests Sit to Stand Details: Tactile cues for placement;Visual cues for safe use of DME/AE;Verbal cues for safe use of DME/AE;Verbal cues for precautions/safety;Verbal cues for technique;Manual facilitation for weight shifting Sit to Stand Details (indicate cue type and reason): Cues for scooting to edge and correct hand placement. Pt tends to reach to others for support. Mod A to steady once up due to posterior LOB Stand to Sit: 3: Mod assist;To chair/3-in-1;To bed;With  armrests;With upper extremity assist Stand to Sit Details (indicate cue type and reason): Visual cues for safe use of DME/AE;Visual cues/gestures for precautions/safety;Visual cues/gestures for sequencing;Verbal cues for sequencing;Manual facilitation for placement Stand to Sit Details: Cues for safe hand placement Stand Pivot Transfers: 3: Mod assist Stand Pivot Transfer Details: Visual cues/gestures for precautions/safety;Verbal cues for sequencing;Verbal cues for technique;Verbal cues for precautions/safety;Verbal cues for safe use of DME/AE;Manual facilitation for weight shifting;Manual facilitation for placement Stand Pivot Transfer Details (indicate cue type and reason): Step-by-step cues for hand placement and turning Locomotion  Ambulation Ambulation: Yes Ambulation/Gait Assistance: 2: Max assist Ambulation Distance (Feet): 15 Feet Assistive device: 1 person hand held  assist Ambulation/Gait Assistance Details: Tactile cues for weight shifting;Verbal cues for sequencing;Verbal cues for technique;Verbal cues for precautions/safety;Manual facilitation for weight shifting;Manual facilitation for placement Ambulation/Gait Assistance Details (indicate cue type and reason): Pt reaching for environmental support and with difficulty with bil foot clearance.  Gait Gait Pattern: Step-through pattern;Decreased stride length;Decreased weight shift to right;Shuffle;Trunk flexed Stairs / Additional Locomotion Stairs: Yes Stairs Assistance: 2: Max Estate agent Assistance Details: Visual cues for safe use of DME/AE;Visual cues/gestures for precautions/safety;Verbal cues for sequencing;Verbal cues for technique;Verbal cues for precautions/safety;Verbal cues for gait pattern;Verbal cues for safe use of DME/AE;Manual facilitation for weight shifting;Manual facilitation for placement Stairs Assistance Details (indicate cue type and reason): Cues to lead with strong leg, but pt not follow instructions Stair  Management Technique: One rail Left;Step to pattern;Forwards Number of Stairs: 3  Height of Stairs: 6  Wheelchair Mobility Wheelchair Mobility: No  Trunk/Postural Assessment  Cervical Assessment Cervical Assessment: Within Functional Limits Thoracic Assessment Thoracic Assessment: Within Functional Limits Lumbar Assessment Lumbar Assessment: Within Functional Limits Postural Control Postural Control: Deficits on evaluation Postural Limitations: Pt tends to lean posteriorly in sitting, standing, and during functional mobility.   Balance -  Static sitting with supervision assist, bil UE support Static standing with Min A for steadying   Extremity Assessment      RLE Assessment RLE Assessment: Exceptions to [facility] RLE Strength RLE Overall Strength Comments: Grossly 2+/5 throught RLE LLE Assessment LLE Assessment: Exceptions to [facility] LLE Strength LLE Overall Strength Comments: Grossly 3/5 throughout  See FIM for current functional status Refer to Care Plan for Long Term Goals  Recommendations for other services: None  Discharge Criteria: Patient will be discharged from PT if patient refuses treatment 3 consecutive times without medical reason, if treatment goals not met, if there is a change in medical status, if patient makes no progress towards goals or if patient is discharged from hospital.  The above assessment, treatment plan, treatment alternatives and goals were discussed and mutually agreed upon: by patient and by family  Tx initiated this session with functional mobility training with cues for lateral, forward, and backward scooting with LUE assist. Repeated sit<>stand for LE strengthening and instruction for safety. Pt continues to require lifting and lowering assist and cues to not reach for others, but instead push from surface. ( , TA)  [employer] 03/16/2011, 9:33 AM

## 2011-03-16 NOTE — Progress Notes (Signed)
Speech Language Pathology Short Term Goals  Patient Details  Name: Christina Pittman MRN: [id number] Date of Birth: Sep 04, 1919   Short Term Goals:  SLP Short Term Goal 1 (Week 1): Pt will demonstrate functional problem solving for familiar tasks with Mod A verbal cues. SLP Short Term Goal 2 (Week 1): Pt will demonstrate sustained attention to a functional task for ~10 mins with Mod A verbal cues for redirection SLP Short Term Goal 3 (Week 1): Pt will utilize external aids to increase recall of daily information with Min A verbal cues.  SLP Short Term Goal 4 (Week 1): Pt will utilize swallowing compensatory strategies with supervision verbal cues SLP Short Term Goal 5 (Week 1): pt will consume Dys. 2 textures and nectar-thick liquids without overt s/s of aspiration with supervision verbal cues.     Doristine Shehan 03/16/2011, 2:39 PM

## 2011-03-16 NOTE — Evaluation (Signed)
Occupational Therapy Assessment and Plan and Skilled Therapy Intervention  Patient Details  Name: Christina Pittman MRN: [id number] Date of Birth: March 20, 1919  OT Diagnosis: abnormal posture, apraxia, cognitive deficits, disturbance of vision and hemiplegia affecting dominant side Rehab Potential: Rehab Potential: Good ELOS:2 weeks   Today's Date: 03/16/2011 Time: 1030-1130 Time Calculation (min): 60 min  Skilled Therapy Intervention:  Pt seen for initial evaluation and ADL retraining with bathing/ dressing at sink level with a focus on sitting and standing balance, active use of right shoulder.  Pt. Makes good attempts to move right arm and attends to her right side 50% of time.  Demonstrated left field inattention with ambulating into bathroom.  In standing, pt's has shortening of right trunk with leaning to the right.  Pt followed basic directions with min verbal cues.  Problem List:  Patient Active Problem List  Diagnoses  . Stroke  . HTN (hypertension)  . DM (diabetes mellitus screen)  . DM2 (diabetes mellitus, type 2)  . Insomnia  . Hemiparesis, right  . HLD (hyperlipidemia)  . Dysphagia    Past Medical History:  Past Medical History  Diagnosis Date  . Arthritis   . Diabetes mellitus   . Hypertension   . Heart murmur    Past Surgical History:  Past Surgical History  Procedure Date  . Abdominal hysterectomy     Assessment & Plan Clinical Impression: Christina Pittman 78 year old right-handed female admitted February 13 with right-sided weakness and slurred speech. MRI of the brain showed acute nonhemorrhagic scattered small infarcts involving portions of the left sub-insular region, left external capsule, posterior left corona radiata and left parietal lobe. 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 Carotid  Dopplers with no significant internal carotid artery stenosis. Echocardiogram done revealing moderate LVH with EF 65-70 % and moderate to severe aortic stenosis. .Neurology was consulted recommends continuing plavix Therapy and aggressive risk factor control. It was felt that infarcts most likely secondary to embolic source however not a Coumadin candidate due to fall risk. There is no current mention of plan for pituitary tumor identified for MRI. Speech therapy followup for dysphagia placed on dysphagia 2 nectar thick liquid diet. Patient with swallow difficulty for past 6 months with weight loss and ST feels patient likely with sensory and/or motor dysphagia. Subcutaneous Lovenox was added for deep vein thrombosis prophylaxis.  Patient transferred to CIR on 03/15/2011 .    Patient currently requires max with basic self-care skills secondary to muscle weakness, motor apraxia, decreased attention to right, decreased memory and delayed processing and decreased sitting balance, decreased standing balance, decreased postural control and hemiplegia.  Prior to hospitalization, patient could complete basic ADLs independently.  Patient will benefit from skilled intervention to increase independence with basic self-care skills prior to discharge home with care partner.  Anticipate patient will require 24 hour supervision and follow up home health.  OT - End of Session Activity Tolerance: Tolerates 30+ min activity with multiple rests Endurance Deficit: Yes Endurance Deficit Description: Pt fatigued and needing to return to bed at end of tx OT Assessment Rehab Potential: Good Barriers to Discharge: None OT Plan OT Frequency: 1-2 X/day, 60-90 minutes;5 out of 7 days Estimated Length of Stay: weeks OT Treatment/Interventions: Balance/vestibular training;Cognitive remediation/compensation;DME/adaptive equipment instruction;Neuromuscular re-education;Patient/family education;Therapeutic Activities;Functional  mobility training;Self Care/advanced ADL retraining;Therapeutic Exercise;UE/LE Strength taining/ROM;UE/LE Coordination activities;Visual/perceptual remediation/compensation OT  Recommendation Follow Up Recommendations: Home health OT;24 hour supervision/assistance Equipment Recommended: Tub/shower seat  OT Evaluation Precautions/Restrictions  Precautions Precautions: Fall Precaution Comments: impulsive Required Braces or Orthoses: No Restrictions Weight Bearing Restrictions: No General Chart Reviewed: Yes Family/Caregiver Present: Yes (2 sons and wives present at start of session) Vital Signs   Pain Pain Assessment Pain Assessment: No/denies pain Home Living/Prior Functioning Home Living Lives With: Alone Receives Help From: Family Type of Home: House Home Layout: One level Home Access: Stairs to enter;Ramped entrance Entrance Stairs-Rails: Can reach both Entrance Stairs-Number of Steps: 1 Bathroom Shower/Tub: Health visitor: Handicapped height Bathroom Accessibility: Yes How Accessible: Accessible via walker Home Adaptive Equipment: Walker - rolling;Quad cane;Grab bars around toilet;Grab bars in shower Additional Comments: Pt has family that leaves nearby and (A) daily. Pt son cooks breakfast everyday and checks on pt multiple times throughout the day. He works days, but other family around are available Prior Function Level of Independence: Independent with basic ADLs;Independent with homemaking with ambulation;Independent with gait;Independent with transfers Able to Take Stairs?: Yes Driving: Yes Vocation: Retired ADL ADL Grooming: Minimal assistance Where Assessed-Grooming: Sitting at sink Upper Body Bathing: Moderate assistance Lower Body Bathing: Moderate assistance Where Assessed-Lower Body Bathing: Sitting at sink;Standing at sink Upper Body Dressing: Maximal assistance Where Assessed-Upper Body Dressing: Wheelchair Lower Body Dressing: Maximal  assistance Where Assessed-Lower Body Dressing: Wheelchair Toileting: Maximal assistance Where Assessed-Toileting: Teacher, adult education: Moderate assistance;Minimal verbal cueing Toilet Transfer Method: Stand pivot Toilet Transfer Equipment: Grab bars Tub/Shower Transfer: Not assessed Film/video editor: Not assessed Vision/Perception  Vision - History Baseline Vision: No visual deficits Patient Visual Report: Peripheral vision impairment Vision - Assessment Tracking/Visual Pursuits: Requires cues, head turns, or add eye shifts to track;Unable to hold eye position out of midline;Decreased smoothness of horizontal tracking;Decreased smoothness of vertical tracking Saccades: Additional eye shifts occurred during testing Visual Fields: Right visual field deficit Perception Perception: Within Functional Limits Praxis Praxis: Impaired Praxis Impairment Details: Ideomotor;Motor planning  Cognition Overall Cognitive Status: Impaired at baseline (per family, pt had premorbid memory deficits) Arousal/Alertness: Awake/alert Orientation Level: Oriented to person;Oriented to place;Oriented to time Awareness: Impaired Awareness Impairment: Anticipatory impairment;Emergent impairment Problem Solving: Impaired Problem Solving Impairment: Functional basic;Functional complex Behaviors: Impulsive Safety/Judgment: Impaired Comments: Pt needs cues for safety with functional mobility. Sensation Sensation Light Touch: Impaired Detail (LE intact 90% except bottom of feet) Light Touch Impaired Details: Impaired RUE;Impaired RLE Stereognosis: Not tested Hot/Cold: Not tested Proprioception: Impaired Detail Proprioception Impaired Details: Impaired RUE Coordination Gross Motor Movements are Fluid and Coordinated: No Fine Motor Movements are Fluid and Coordinated: No Coordination and Movement Description: LE coordination limited by weakness R>L Heel Shin Test: impaired L on R Motor   Motor Motor: Hemiplegia;Motor apraxia Motor - Skilled Clinical Observations: Pt with decreased motor activiation in R UE/LE Mobility  Bed Mobility Bed Mobility: Yes Rolling Left: 4: Min assist Rolling Left Details: Tactile cues for initiation;Visual cues/gestures for sequencing;Verbal cues for sequencing;Verbal cues for technique;Manual facilitation for placement;Manual facilitation for weight shifting Left Sidelying to Sit: 3: Mod assist;HOB flat Left Sidelying to Sit Details: Verbal cues for technique;Verbal cues for sequencing;Visual cues/gestures for sequencing;Manual facilitation for weight shifting Sit to Supine: 4: Min assist;HOB flat Sit to Supine - Details: Verbal cues for sequencing;Verbal cues for technique Scooting to HOB: 3: Mod assist Scooting to [facility] Details: Tactile cues for placement;Manual facilitation for weight shifting;Verbal cues for sequencing;Verbal cues for technique Transfers Transfers: Yes Sit to Stand: 2: Max assist;With upper extremity assist;From bed;From  chair/3-in-1;With armrests Sit to Stand Details: Tactile cues for placement;Visual cues for safe use of DME/AE;Verbal cues for safe use of DME/AE;Verbal cues for precautions/safety;Verbal cues for technique;Manual facilitation for weight shifting Sit to Stand Details (indicate cue type and reason): Cues for scooting to edge and correct hand placement. Pt tends to reach to others for support. Mod A to steady once up due to posterior LOB Stand to Sit: 3: Mod assist;To chair/3-in-1;To bed;With armrests;With upper extremity assist Stand to Sit Details (indicate cue type and reason): Visual cues for safe use of DME/AE;Visual cues/gestures for precautions/safety;Visual cues/gestures for sequencing;Verbal cues for sequencing;Manual facilitation for placement Stand to Sit Details: Cues for safe hand placement  Trunk/Postural Assessment  Cervical Assessment Cervical Assessment: Within Functional Limits Thoracic  Assessment Thoracic Assessment: Within Functional Limits Lumbar Assessment Lumbar Assessment: Within Functional Limits Postural Control Postural Control: Deficits on evaluation Postural Limitations: Pt tends to lean posteriorly in sitting, standing, and during functional mobility.   Balance Balance Balance Assessed: Yes Static Sitting Balance Static Sitting - Level of Assistance: 5: Stand by assistance Dynamic Sitting Balance Dynamic Sitting - Level of Assistance: 3: Mod assist Static Standing Balance Static Standing - Level of Assistance: 3: Mod assist Dynamic Standing Balance Dynamic Standing - Level of Assistance: 2: Max assist Extremity/Trunk Assessment RUE Strength Right Shoulder Flexion: 3-/5 Right Elbow Flexion: 3-/5 Right Elbow Extension: 3/5 Right Forearm Pronation: 2+/5 Right Forearm Supination: 2+/5 Gross Grasp: Impaired (no active finger flexion or extension) LUE Assessment LUE Assessment: Within Functional Limits  See FIM for current functional status Refer to Care Plan for Long Term Goals  Recommendations for other services: None  Discharge Criteria: Patient will be discharged from OT if patient refuses treatment 3 consecutive times without medical reason, if treatment goals not met, if there is a change in medical status, if patient makes no progress towards goals or if patient is discharged from hospital.  The above assessment, treatment plan, treatment alternatives and goals were discussed and mutually agreed upon: by patient and by family  Kaiana Marion 03/16/2011, 12:10 PM

## 2011-03-16 NOTE — Progress Notes (Signed)
Patient unable to void after 8 hours.  Patient attempted void on bedside commode.  Patient denies bladder pressure or discomfort.  Patient bladder scanned for 282 mL.  Dr. Riley Kill notified of inability to void; new order received for an in and out cath.  Will continue to monitor.

## 2011-03-17 LAB — GLUCOSE, CAPILLARY: Glucose-Capillary: 129 mg/dL — ABNORMAL HIGH (ref 70–99)

## 2011-03-17 LAB — URINE CULTURE: Culture: NO GROWTH

## 2011-03-17 MED ORDER — LISINOPRIL 20 MG PO TABS
20.0000 mg | ORAL_TABLET | Freq: Every day | ORAL | Status: DC
  Administered 2011-03-17 – 2011-03-25 (×9): 20 mg via ORAL
  Filled 2011-03-17 (×12): qty 1

## 2011-03-17 MED ORDER — BISACODYL 10 MG RE SUPP: 10.0000 mg | Freq: Every day | RECTAL | Status: DC | PRN

## 2011-03-17 MED ORDER — SENNA 8.6 MG PO TABS
1.0000 | ORAL_TABLET | Freq: Every day | ORAL | Status: DC
  Filled 2011-03-17: qty 1
  Filled 2011-03-17: qty 9
  Filled 2011-03-17 (×4): qty 1

## 2011-03-17 MED ORDER — SORBITOL 70 % SOLN: 30.0000 mL | Freq: Every day | Status: DC | PRN

## 2011-03-17 MED ORDER — ACETAMINOPHEN 500 MG PO TABS
500.0000 mg | ORAL_TABLET | Freq: Four times a day (QID) | ORAL | Status: DC | PRN
  Administered 2011-03-17 – 2011-03-25 (×7): 500 mg via ORAL
  Filled 2011-03-17 (×6): qty 1

## 2011-03-17 MED ORDER — MEGESTROL ACETATE 400 MG/10ML PO SUSP
400.0000 mg | Freq: Two times a day (BID) | ORAL | Status: DC
  Administered 2011-03-17 – 2011-03-18 (×3): 400 mg via ORAL
  Filled 2011-03-17 (×6): qty 10

## 2011-03-17 NOTE — Progress Notes (Signed)
Patient ID: Christina Pittman, female   DOB: 13-Feb-1919, 78 y.o.   MRN: [id number] Patient ID: Christina Pittman, female   DOB: 1919/02/15, 78 y.o.   MRN: [id number] Subjective/Complaints: Review of Systems  Neurological: Positive for sensory change and focal weakness.  All other systems reviewed and are negative.  2/17--"i want to go home"  Objective: Vital Signs: Blood pressure 126/82, pulse 76, temperature 98.6 F (37 C), temperature source Oral, resp. rate 18, SpO2 98.00%. Dg Swallowing Func-no Report  03/15/2011  CLINICAL DATA: dysphagia   FLUOROSCOPY FOR SWALLOWING FUNCTION STUDY:  Fluoroscopy was provided for swallowing function study, which was  administered by a speech pathologist.  Final results and recommendations  from this study are contained within the speech pathology report.     No results found for this basename: WBC:2,HGB:2,HCT:2,PLT:2 in the last 72 hours No results found for this basename: NA:2,K:2,CL:2,CO2:2,GLUCOSE:2,BUN:2,CREATININE:2,CALCIUM:2 in the last 72 hours CBG (last 3)   Basename 03/17/11 0738 03/16/11 2107 03/16/11 1655  GLUCAP 138* 195* 161*    Wt Readings from Last 3 Encounters:  03/13/11 61.78 kg (136 lb 3.2 oz)  10/28/10 63.957 kg (141 lb)    Physical Exam:  General appearance: alert, cooperative and no distress Head: Normocephalic, without obvious abnormality, atraumatic Eyes: conjunctivae/corneas clear. PERRL, EOM's intact. Fundi benign. Ears: normal TM's and external ear canals both ears Nose: Nares normal. Septum midline. Mucosa normal. No drainage or sinus tenderness. Throat: lips, mucosa, and tongue normal; teeth and gums normal Neck: no adenopathy, no carotid bruit, no JVD, supple, symmetrical, trachea midline and thyroid not enlarged, symmetric, no tenderness/mass/nodules Back: symmetric, no curvature. ROM normal. No CVA tenderness. Resp: clear to auscultation bilaterally Cardio: regular rate and rhythm, S1, S2 normal, no murmur, click, rub or  gallop and normal apical impulse GI: soft, non-tender; bowel sounds normal; no masses,  no organomegaly Extremities: extremities normal, atraumatic, no cyanosis or edema Pulses: 2+ and symmetric Skin: Skin color, texture, turgor normal. No rashes or lesions Neurologic: very alert.  Speech clearer today. RUE 2-3/5 RLE 3+/5. Sensory 1/2 on right.  Mild right 7 and 12.  cognitvely very alert. Incision/Wound: chronic skin changes and chronic wounds on right foot 2/17 exam  Assessment/Plan: 1. Functional deficits secondary to left cortical/subcortical infarcts which require 3+ hours per day of interdisciplinary therapy in a comprehensive inpatient rehab setting. Physiatrist is providing close team supervision and 24 hour management of active medical problems listed below. Physiatrist and rehab team continue to assess barriers to discharge/monitor patient progress toward functional and medical goals. FIM: FIM - Bathing Bathing Steps Patient Completed: Chest;Abdomen;Front perineal area;Right upper leg;Left upper leg;Left lower leg (including foot);Right lower leg (including foot) Bathing: 3: Mod-Patient completes 5-7 2f 10 parts or 50-74%  FIM - Upper Body Dressing/Undressing Upper body dressing/undressing: 1: Total-Patient completed less than 25% of tasks FIM - Lower Body Dressing/Undressing Lower body dressing/undressing steps patient completed: Thread/unthread left underwear leg Lower body dressing/undressing: 1: Total-Patient completed less than 25% of tasks  FIM - Toileting Toileting steps completed by patient: Performs perineal hygiene Toileting: 2: Max-Patient completed 1 of 3 steps  FIM - Diplomatic Services operational officer Devices: Psychiatrist Transfers: 3-To toilet/BSC: Mod A (lift or lower assist)  FIM - Banker Devices: Environmental consultant;Arm rests Bed/Chair Transfer: 3: Supine > Sit: Mod A (lifting assist/Pt. 50-74%/lift 2 legs;4:  Sit > Supine: Min A (steadying pt. > 75%/lift 1 leg);2: Bed > Chair or W/C: Max A (lift and lower assist);3: Chair or  W/C > Bed: Mod A (lift or lower assist)  FIM - Locomotion: Wheelchair Locomotion: Wheelchair: 1: Total Assistance/staff pushes wheelchair (Pt<25%) FIM - Locomotion: Ambulation Locomotion: Ambulation Assistive Devices: Other (comment) (None) Ambulation/Gait Assistance: 2: Max assist Locomotion: Ambulation: 1: Travels less than 50 ft with maximal assistance (Pt: 25 - 49%)  Comprehension Comprehension Mode: Auditory Comprehension: 5-Understands basic 90% of the time/requires cueing < 10% of the time  Expression Expression Mode: Verbal Expression: 5-Expresses basic 90% of the time/requires cueing < 10% of the time.  Social Interaction Social Interaction: 4-Interacts appropriately 75 - 89% of the time - Needs redirection for appropriate language or to initiate interaction.  Problem Solving Problem Solving: 2-Solves basic 25 - 49% of the time - needs direction more than half the time to initiate, plan or complete simple activities  Memory Memory: 3-Recognizes or recalls 50 - 74% of the time/requires cueing 25 - 49% of the time  1. DVT Prophylaxis/Anticoagulation: Pharmaceutical: Lovenox  2. Pain Management: use tylenol prn for LBP. Also has pain over the right first toe and metatarsals. Protect skin 3. Mood: showing some signs of depression 4. DM type 2: resumed metformin bid. cbg's showing improvement 6. Leukocytosis: WBC on steady trend upwards and some confusion reported. ua negative and CXR yesterday negative ucx still pending.. Monitor for fevers.  7. HTN: Monitor with bid checks. Continue lisinopril (reduce to 20mg ) and coreg. Patient has had labile blood pressures lately. We'll adjust regimen as needed. BP's have been generally a little low thus far. 8. Dyslipidemia: Continue zocor daily.  9. wound care: Continue pressure relief and dressing as appropriate. These  wounds are chronic.  10. FEN- note eating really well. Poor appetite.  Add megace  -check labs in am  -family and staff are pusing po  LOS (Days) 2 A FACE TO FACE EVALUATION WAS PERFORMED  Deedra Pro T 03/17/2011, 8:02 AM

## 2011-03-17 NOTE — Progress Notes (Signed)
Physical Therapy Note  Patient Details  Name: Heidee Audi MRN: [id number] Date of Birth: 20-Jan-1920 Today's Date: 03/17/2011  4098-1191 (45 minutes) individual Pain: no complaint of pain Focus of treatment: Gait training with/without AD Treatment: Supine to sit min assist with cueing to use bedrail; Transfer SPT min assist bed<>wc; Gait with PFRW (rt) min assist to guide AD; pt unable to steer AD without assist (60 feet); Gait with hiking pole LT UE 40 feet X 2 min/mod assist with tactile cues at RT shoulder to decrease forward trunk flexion in stance on RT (decreased RT  Hip extension) to facilitate increased weight bearing RT LE in stance.; sit to stand from wc min assist with vcs for hand placement to use armrest; Standing - pt requires assist to maintain static standing balance without AD and is easily displaced posteriorly and maintains forward trunk lean to prevent posterior loss of balance; Standing with rhythmic stabilization at pelvis to increase bilateral hip extension in stance.   Destyne Goodreau,JIM 03/17/2011, 9:47 AM

## 2011-03-18 DIAGNOSIS — Z5189 Encounter for other specified aftercare: Secondary | ICD-10-CM

## 2011-03-18 DIAGNOSIS — I6340 Cerebral infarction due to embolism of unspecified cerebral artery: Secondary | ICD-10-CM

## 2011-03-18 DIAGNOSIS — G8110 Spastic hemiplegia affecting unspecified side: Secondary | ICD-10-CM

## 2011-03-18 LAB — GLUCOSE, CAPILLARY
Glucose-Capillary: 170 mg/dL — ABNORMAL HIGH (ref 70–99)
Glucose-Capillary: 153 mg/dL — ABNORMAL HIGH (ref 70–99)

## 2011-03-18 LAB — DIFFERENTIAL
Eosinophils Absolute: 0.2 10*3/uL (ref 0.0–0.7)
Lymphocytes Relative: 26 % (ref 12–46)
Lymphs Abs: 3.1 10*3/uL (ref 0.7–4.0)
Neutrophils Relative %: 62 % (ref 43–77)

## 2011-03-18 LAB — BASIC METABOLIC PANEL
Calcium: 9.3 mg/dL (ref 8.4–10.5)
GFR calc Af Amer: 56 mL/min — ABNORMAL LOW (ref >90)
GFR calc non Af Amer: 48 mL/min — ABNORMAL LOW (ref >90)
Sodium: 136 mEq/L (ref 135–145)

## 2011-03-18 LAB — CBC
Platelets: 276 10*3/uL (ref 150–400)
RBC: 4.13 MIL/uL (ref 3.87–5.11)
WBC: 12 10*3/uL — ABNORMAL HIGH (ref 4.0–10.5)

## 2011-03-18 LAB — PREALBUMIN: Prealbumin: 16.5 mg/dL — ABNORMAL LOW (ref 17.0–34.0)

## 2011-03-18 MED ORDER — SODIUM CHLORIDE 0.9 % IV SOLN
INTRAVENOUS | Status: DC
  Administered 2011-03-19: 21:00:00 via INTRAVENOUS
  Administered 2011-03-19: 75 mL/h via INTRAVENOUS
  Administered 2011-03-19 – 2011-03-21 (×3): via INTRAVENOUS

## 2011-03-18 MED ORDER — SACCHAROMYCES BOULARDII 250 MG PO CAPS
500.0000 mg | ORAL_CAPSULE | Freq: Two times a day (BID) | ORAL | Status: DC
  Administered 2011-03-18 – 2011-03-26 (×16): 500 mg via ORAL
  Filled 2011-03-18 (×18): qty 2

## 2011-03-18 MED ORDER — ENSURE PUDDING PO PUDG
1.0000 | Freq: Three times a day (TID) | ORAL | Status: DC
  Administered 2011-03-18 – 2011-03-25 (×18): 1 via ORAL

## 2011-03-18 MED ORDER — SODIUM CHLORIDE 0.9 % IV SOLN: Freq: Two times a day (BID) | INTRAVENOUS | Status: DC

## 2011-03-18 MED ORDER — METRONIDAZOLE 500 MG PO TABS
500.0000 mg | ORAL_TABLET | Freq: Three times a day (TID) | ORAL | Status: DC
  Administered 2011-03-18 – 2011-03-26 (×23): 500 mg via ORAL
  Filled 2011-03-18 (×28): qty 1

## 2011-03-18 NOTE — Plan of Care (Signed)
Problem: RH BOWEL ELIMINATION Goal: RH STG MANAGE BOWEL W/MEDICATION W/ASSISTANCE STG Manage Bowel with Medication with Modified Independent Assistance.  Outcome: Not Progressing Dependent on staff for medication management, pt does not ask  Problem: RH SKIN INTEGRITY Goal: RH STG SKIN FREE OF INFECTION/BREAKDOWN Minimum assistance with skin care  Outcome: Not Progressing Total assist of staff Goal: RH STG MAINTAIN SKIN INTEGRITY WITH ASSISTANCE STG Maintain Skin Integrity With Minimum Assistance.  Outcome: Not Progressing Total assist of staff  Problem: RH KNOWLEDGE DEFICIT Goal: RH STG INCREASE KNOWLEDGE OF DIABETES Verbalize importance of daily glucose monitoring and diet considerations  Outcome: Not Progressing Can not verbalize information given about topic Goal: RH STG INCREASE KNOWLEDGE OF HYPERTENSION Verbalize importance of medications and low salt heart healthy diet  Outcome: Not Progressing Can not verbalize information given about topic Goal: RH STG INCREASE KNOWLEDGE OF DYSPHAGIA/FLUID INTAKE Verbalize importance of maintaining dysphagia diet limitations  Outcome: Not Progressing Can not verbalize information given about topic

## 2011-03-18 NOTE — Consult Note (Signed)
WOC consult Note Reason for Consult: Consult requested for right foot wounds. Wound type: Right inner great toe with full thickness wound, pt states she fell prior to admission.  .1X.1X.1cm, bone palpable when probed.  Moist red wound bed with yellow drainage, no odor.  2nd toe .2X.2X.1cm partial thickness abrasion, pink and moist.  3rd toe with partial thickness wound, .2X.2X.1cm, mod yellow drainage.  Toes slightly deformed and not straight, generalized erythremia and pain when touched. Dressing procedure/placement/frequency: Calcium alginate has already been ordered.  Agree with this plan of care to absorb drainage and promote healing.  This will not need to be changed every day; will order 3Xweek.  Will not plan to follow further unless re-consulted.  [address], RN, MSN, [employer]  [phone]

## 2011-03-18 NOTE — Progress Notes (Signed)
Patient ID: Temima Kutsch, female   DOB: Feb 10, 1919, 78 y.o.   MRN: [id number] Subjective/Complaints: Review of Systems  Neurological: Positive for sensory change and focal weakness.  All other systems reviewed and are negative.    Objective: Vital Signs: Blood pressure 148/87, pulse 71, temperature 98.3 F (36.8 C), temperature source Oral, resp. rate 18, SpO2 99.00%. No results found.  Basename 03/18/11 0730  WBC 12.0*  HGB 12.6  HCT 37.3  PLT 276   No results found for this basename: NA:2,K:2,CL:2,CO2:2,GLUCOSE:2,BUN:2,CREATININE:2,CALCIUM:2 in the last 72 hours CBG (last 3)   Basename 03/18/11 0724 03/17/11 2058 03/17/11 1728  GLUCAP 115* 129* 155*    Wt Readings from Last 3 Encounters:  03/13/11 61.78 kg (136 lb 3.2 oz)  10/28/10 63.957 kg (141 lb)    Physical Exam:  General appearance: alert, cooperative and no distress Head: Normocephalic, without obvious abnormality, atraumatic Eyes: conjunctivae/corneas clear. PERRL, EOM's intact. Fundi benign. Ears: normal TM's and external ear canals both ears Nose: Nares normal. Septum midline. Mucosa normal. No drainage or sinus tenderness. Throat: lips, mucosa, and tongue normal; teeth and gums normal Neck: no adenopathy, no carotid bruit, no JVD, supple, symmetrical, trachea midline and thyroid not enlarged, symmetric, no tenderness/mass/nodules Back: symmetric, no curvature. ROM normal. No CVA tenderness. Resp: clear to auscultation bilaterally Cardio: regular rate and rhythm, S1, S2 normal, no murmur, click, rub or gallop and normal apical impulse GI: soft, non-tender; bowel sounds normal; no masses,  no organomegaly Extremities: extremities normal, atraumatic, no cyanosis or edema Pulses: 2+ and symmetric Skin: Skin color, texture, turgor normal. No rashes or lesions Neurologic: very alert.  Speech clearer today. RUE 2-3/5 RLE 3+/5. Sensory 1/2 on right.  Mild right 7 and 12.  cognitvely very alert. Incision/Wound:  chronic skin changes and chronic wounds on right foot   Assessment/Plan: 1. Functional deficits secondary to left cortical/subcortical infarcts which require 3+ hours per day of interdisciplinary therapy in a comprehensive inpatient rehab setting. Physiatrist is providing close team supervision and 24 hour management of active medical problems listed below. Physiatrist and rehab team continue to assess barriers to discharge/monitor patient progress toward functional and medical goals. FIM: FIM - Bathing Bathing Steps Patient Completed: Chest;Abdomen;Front perineal area;Right upper leg;Left upper leg;Left lower leg (including foot);Right lower leg (including foot) Bathing: 3: Mod-Patient completes 5-7 55f 10 parts or 50-74%  FIM - Upper Body Dressing/Undressing Upper body dressing/undressing: 1: Total-Patient completed less than 25% of tasks FIM - Lower Body Dressing/Undressing Lower body dressing/undressing steps patient completed: Thread/unthread left underwear leg Lower body dressing/undressing: 1: Total-Patient completed less than 25% of tasks  FIM - Toileting Toileting steps completed by patient: Performs perineal hygiene Toileting: 2: Max-Patient completed 1 of 3 steps  FIM - Diplomatic Services operational officer Devices: Psychiatrist Transfers: 3-To toilet/BSC: Mod A (lift or lower assist)  FIM - Banker Devices: Bed rails;HOB elevated Bed/Chair Transfer: 4: Supine > Sit: Min A (steadying Pt. > 75%/lift 1 leg);3: Bed > Chair or W/C: Mod A (lift or lower assist)  FIM - Locomotion: Wheelchair Locomotion: Wheelchair: 0: Activity did not occur FIM - Locomotion: Ambulation Locomotion: Ambulation Assistive Devices: TEFL teacher Ambulation/Gait Assistance: 3: Mod assist Locomotion: Ambulation: 2: Travels 50 - 149 ft with moderate assistance (Pt: 50 - 74%)  Comprehension Comprehension Mode: Auditory Comprehension:  5-Understands complex 90% of the time/Cues < 10% of the time  Expression Expression Mode: Verbal Expression: 5-Expresses complex 90% of the time/cues < 10% of  the time  Social Interaction Social Interaction: 4-Interacts appropriately 75 - 89% of the time - Needs redirection for appropriate language or to initiate interaction.  Problem Solving Problem Solving: 2-Solves basic 25 - 49% of the time - needs direction more than half the time to initiate, plan or complete simple activities  Memory Memory: 3-Recognizes or recalls 50 - 74% of the time/requires cueing 25 - 49% of the time  1. DVT Prophylaxis/Anticoagulation: Pharmaceutical: Lovenox  2. Pain Management: use tylenol prn for LBP. Also has pain over the right first toe and metatarsals. Protect skin 3. Mood: showing some signs of depression 4. DM type 2: resumed metformin bid. cbg's showing improvement 6. Leukocytosis: WBC on steady trend upwards and some confusion reported. ua negative and CXR yesterday negative ucx still pending.. Monitor for fevers.  7. HTN: Monitor with bid checks. Continue lisinopril (reduce to 20mg ) and coreg. Patient has had labile blood pressures lately. We'll adjust regimen as needed. BP's have been generally a little low thus far. 8. Dyslipidemia: Continue zocor daily.  9. wound care: Continue pressure relief and dressing as appropriate. These wounds are chronic.  10. FEN- note eating really well. Poor appetite.  Add megace  -check labs in am  -family and staff are pushing po 11. Chronic toe ulcer- calcium alginate dressing  LOS (Days) 3 A FACE TO FACE EVALUATION WAS PERFORMED  Audree Schrecengost E 03/18/2011, 8:07 AM

## 2011-03-18 NOTE — Significant Event (Signed)
Christina Nestle, PA notified of C-Diff PCR results as positive.Pt already on Enteric contact since order for rule out  C - Diff No new orders given at this time. Education provided to patient and family. Explained contact isolation, family declines gown and gloves however verbalizes understanding of no eating or drinking or using patient's restroom. will continue to monitor. [name], Galia Rahm Elon Jester

## 2011-03-18 NOTE — Progress Notes (Signed)
Speech Language Pathology Daily Session Note  Patient Details  Name: Christina Pittman MRN: [id number] Date of Birth: Dec 07, 1919  Today's Date: 03/18/2011 Time: 1455-1540 Time Calculation (min): 45 min  Short Term Goals: Week 1: SLP Short Term Goal 1 (Week 1): Pt will demonstrate functional problem solving for familiar tasks with Mod A verbal cues. SLP Short Term Goal 2 (Week 1): Pt will demonstrate sustained attention to a functional task for ~10 mins with Mod A verbal cues for redirection SLP Short Term Goal 3 (Week 1): Pt will utilize external aids to increase recall of daily information with Min A verbal cues.  SLP Short Term Goal 4 (Week 1): Pt will utilize swallowing compensatory strategies with supervision verbal cues SLP Short Term Goal 5 (Week 1): pt will consume Dys. 2 textures and nectar-thick liquids without overt s/s of aspiration with supervision verbal cues.   Skilled Therapeutic Interventions: Patient and family requested that she have water and SLP facilitated session with education regarding procedures for water protocol and demonstrated process; patient consumed 4 oz. thin liquids via cup with no overt s/s of aspiration and family was provided with a handout.  Patient required max-mod assist verbal cues to attend to right upper extremity and verbalize intellectual awareness of deficits.    Daily Session Precautions/Restrictions  Precautions Precautions: Fall Required Braces or Orthoses: Yes (Walker splint on the right side.) Restrictions Weight Bearing Restrictions: No FIM:  Comprehension Comprehension Mode: Auditory Comprehension: 5-Follows basic conversation/direction: With extra time/assistive device Expression Expression Mode: Verbal Expression: 5-Expresses basic needs/ideas: With no assist Social Interaction Social Interaction: 4-Interacts appropriately 75 - 89% of the time - Needs redirection for appropriate language or to initiate interaction. Problem  Solving Problem Solving: 2-Solves basic 25 - 49% of the time - needs direction more than half the time to initiate, plan or complete simple activities Memory Memory: 4-Recognizes or recalls 75 - 89% of the time/requires cueing 10 - 24% of the time General    Pain Pain Assessment Pain Assessment: No/denies pain Pain Score: 0-No pain  Therapy/Group: Individual Therapy  Charlane Ferretti., CCC-SLP [phone]  Tim Corriher 03/18/2011, 4:43 PM

## 2011-03-18 NOTE — Progress Notes (Addendum)
Inpatient Rehabilitation Center Individual Statement of Services  Patient Name:  Christina Pittman  Date:  03/18/2011  Welcome to the Inpatient Rehabilitation Center.  Our goal is to provide you with an individualized program based on your diagnosis and situation, designed to meet your specific needs.  With this comprehensive rehabilitation program, you will be expected to participate in at least 3 hours of rehabilitation therapies Monday-Friday, with modified therapy programming on the weekends.  Your rehabilitation program will include the following services:  Physical Therapy (PT), Occupational Therapy (OT), Speech Therapy (ST), 24 hour per day rehabilitation nursing, Therapeutic Recreaction (TR), Case Management (RN and Child psychotherapist), Rehabilitation Medicine, Nutrition Services and Pharmacy Services  Weekly team conferences will be held on Wednesdays to discuss your progress.  Your RN Case Designer, television/film set will talk with you frequently to get your input and to update you on team discussions.  Team conferences with you and your family in attendance may also be held.  Expected length of stay: 2-3 weeks  Overall anticipated outcome:  Supervision  Depending on your progress and recovery, your program may change.  Your RN Case Estate agent will coordinate services and will keep you informed of any changes.  Your RN Sports coach and SW names and contact numbers are listed  below.  The following services may also be recommended but are not provided by the Inpatient Rehabilitation Center:   Driving Evaluations  Home Health Rehabiltiation Services  Outpatient Rehabilitatation [facility]  Vocational Rehabilitation   Arrangements will be made to provide these services after discharge if needed.  Arrangements include referral to agencies that provide these services.  Your insurance has been verified to [id number]   Your primary doctor is:  TBD  Pertinent information will be  shared with your doctor and your insurance company.  Case Manager: Lutricia Horsfall, [facility] [phone]  Social Worker:  Dossie Der, [place] [phone]  Information discussed with pt and family and copy given to patient by: Meryl Dare, 03/18/2011

## 2011-03-18 NOTE — Progress Notes (Signed)
Patient information reviewed and entered into [facility] system by Chamia Schmutz, RN, CRRN, PPS Coordinator.  Information including medical coding and functional independence measure will be reviewed and updated through discharge.     Per nursing patient was given "Data Collection Information Summary for Patients in Inpatient Rehabilitation Facilities with attached "Privacy Act Statement-Health Care Records" upon admission.   

## 2011-03-18 NOTE — Progress Notes (Signed)
Social Work Assessment and Plan Assessment and Plan  Patient Name: Christina Pittman  ZHYQM'V Date: 03/18/2011  Problem List:  Patient Active Problem List  Diagnoses  . Stroke  . HTN (hypertension)  . DM (diabetes mellitus screen)  . DM2 (diabetes mellitus, type 2)  . Insomnia  . Hemiparesis, right  . HLD (hyperlipidemia)  . Dysphagia    Past Medical History:  Past Medical History  Diagnosis Date  . Arthritis   . Diabetes mellitus   . Hypertension   . Heart murmur     Past Surgical History:  Past Surgical History  Procedure Date  . Abdominal hysterectomy     Discharge Planning  Discharge Planning Support Systems: Children;Family members;Church/faith community;Friends/neighbors Assistance Needed: Probably need assist at discharge Case Management Consult Needed: Yes (Comment) (RNCM-following)  Social/Family/Support Systems    Employment Status Employment Status Employment Status: Retired Fish farm manager Issues: No issues Guardian/Conservator: None  Abuse/Neglect    Emotional Status Emotional Status Pt's affect, behavior adn adjustment status: Pt all she wants is to go home.  Daughter is here and reports pt is a homebody and likes being at home.  Pt is a hardworker and is getting movement back in her right arm and leg.  Children are very supportive of her and will do what is needed to assist her at home. Recent Psychosocial Issues: Other medical issues Pyschiatric History: No history- Deferred Beck Depression Screen due to coping appropriately with her diaganosis and is improving already.  Patient/Family Perceptions, Expectations & Goals Pt/Family Perceptions, Expectations and Goals Pt/Family understanding of illness & functional limitations: Pt and daughter can explain her stroke and deficits.  Daughter reports she is already making progress with her arm and leg.  Pt states: " I want to go home." Premorbid pt/family roles/activities: Mother,  Grandmother, [employer], [employer] Member, Chief Financial Officer, etc Anticipated changes in roles/activities/participation: Plans to resume roles Pt/family expectations/goals: Pt states: " I will do better at home atleast sleep better, I can't sleep in that bed." Daughter reports: " She is stubborn but we will be there to help her."  [employer]: None Premorbid Home Care/DME Agencies: None Transportation available at discharge: Family Resource referrals recommended: Support group (specify) ([facility] Support Group)  Discharge Visual merchandiser Resources: [employer] Financial Resources: Social Security Financial Screen Referred: No Living Expenses: Own Money Management: Patient;Family Home Management: Pt Patient/Family Preliminary Plans: Return home with her three children rotating the care.  They are aware someone will need to be with her at discharge.  Daughter reports they have worked it out for someone to be there.  Clinical Impression:  Pleasant antsy female talks repeatedly about going home.  Daughter present in the room and reports pt is a homebody and doesn't like hospitals.  Pt also concerned about the hsoptial Bill, since all she has is Medicare.      Lucy Chris 03/18/2011

## 2011-03-18 NOTE — Progress Notes (Signed)
Occupational Therapy Session Note  Patient Details  Name: Christina Pittman MRN: [id number] Date of Birth: [birth year]  Today's Date: 03/18/2011 Time: 8295-6213 Time Calculation (min): 30 min  Short Term Goals: Week 1:  OT Short Term Goal 1 (Week 1): Pt will transfer to toilet with steady assist. OT Short Term Goal 2 (Week 1): Pt will bathe upper body with min assist. OT Short Term Goal 3 (Week 1): Pt will bathe lower body with min assist. OT Short Term Goal 4 (Week 1): Pt will don shirt with min assist. OT Short Term Goal 5 (Week 1): Pt will don pants with min assist.  Skilled Therapeutic Interventions/Progress Updates:    Worked on supine to sit EOB.  Pt able to perform with min assist.  Able to sit statically EOB with close supervision.  BP closely monitored in sitting 115/77 and 119/71.  Performed sit to stand with mod assist, utilized RW with walker splint on the right for short distance mobility.  Pt feeling better this session than am session.  Pt also worked on brushing her hair using the RUE and max hand over hand assist.  Pt requested to return to bed at end of session.   Therapy Documentation Precautions:  Precautions Precautions: Fall Precaution Comments: right hemiplegia, decreased awareness of deficits with decreased safety awareness Required Braces or Orthoses: Yes (Walker splint on the right side.) Restrictions Weight Bearing Restrictions: No  Vital Signs: Therapy Vitals BP: 114/76 mmHg (115/77 and 119/71 both sitting EOB or chair as well.) Patient Position, if appropriate: Lying Oxygen Therapy SpO2: 96 % O2 Device: None (Room air) Pulse Oximetry Type: Intermittent Pain: Pain Assessment Pain Assessment: No/denies pain Pain Score: 0-No pain  ADL: See FIM for current functional status  Therapy/Group: Individual Therapy  Ladonya Jerkins OTR/L 03/18/2011, 3:47 PM

## 2011-03-18 NOTE — Progress Notes (Signed)
Physical Therapy Session Note  Patient Details  Name: Christina Pittman MRN: [id number] Date of Birth: February 01, 1919  Today's Date: 03/18/2011 Time: 8657-8469 Time Calculation (min): 61 min  Skilled Therapeutic Interventions/Progress Updates:     Therapy Documentation Precautions:  Precautions Precautions: Fall Precaution Comments: right hemiplegia, decreased awareness of deficits with decreased safety awareness Required Braces or Orthoses: No Restrictions Weight Bearing Restrictions: No General: Chart Reviewed: Yes Family/Caregiver Present: Yes (dtr) Pain: Pain Assessment Pain Assessment: No/denies pain Pain Score: 0-No pain    Treatments:  Dtr present for session this morning. PA in room for examination. Per PA, wound of left toe went from being dry to wet over the weekend and requested PT be extra careful with left LE and for dtr to bring in shoes and socks to better protect this area. Patient declined leaving room prior to getting dressed. Upper and lower body dressing with assist of PT, focus on right UE position and sequencing with assist to thread right UE and LE and pulling up pants mod assist. Stand-pivot transfers mod assist left and right, focus on right UE and LE position and sequencing to increase safety and functional independence. Gait x 35 feet with right hand held assist mod assist, manual facilitation for anterior right weight shift and right LE and trunk alignment for improved symmetry and safety. Patient reported needing to go to the bathroom. Toilet transfer mod assist, toileting max assist (1 of 3 steps with min assist for steadying in standing positions). Gait x 40 feet with right hand held assist mod assist, focus on upright posture and not reaching for tables and walls. Per dtr, patient furniture walked PTA. Adjusted RW with right hand splint to discourage furniture walking and improve symmetry when patient reported needing to go to the bathroom again. Incontinent of  diarrhea into underwear. Total assist for clothing change. Blood noted in stool. RN aware. Left in room with RN and dtr at end of session.  See FIM for current functional status  Therapy/Group: Individual Therapy  Romeo Rabon 03/18/2011, 12:35 PM

## 2011-03-18 NOTE — Significant Event (Signed)
Called to room by OT Fayrene Fearing, reported pt with fixed gaze and unresponsive at sink, during bath. OT placed pt back in bed. Angie, RN at bedside, pupils 4 brisk and reactive, pt responding appropriately to questions, CBG checked, results 170, not covered as pt did not eat lunch,BP-100/50, Pulse 66, respirations 22, oxygen sats 96% on room air , laying in bed.- orders to start IV of NS at 75cc/hr now per Marissa Nestle, PA when in room for this event. Iv team paged as no vein visible, awaiting arrival, orders for C Diff PCR, pt placed on contact isolation till results are in. Iv started and fluids started, Pt denies any c/o. Pt stated " just don't feel good", will continue to monitor. [name], Desarai Barrack Elon Jester

## 2011-03-18 NOTE — Progress Notes (Signed)
INITIAL ADULT NUTRITION ASSESSMENT Date: 03/18/2011   Time: 12:24 PM  Reason for Assessment: Dysphagia  ASSESSMENT: Female 78 y.o.  Dx: Stroke  Hx:  Past Medical History  Diagnosis Date  . Arthritis   . Diabetes mellitus   . Hypertension   . Heart murmur    Related Meds:     . sodium chloride   Intravenous Q24H  . carvedilol  12.5 mg Oral BID WC  . clopidogrel  75 mg Oral Q breakfast  . feeding supplement  1 Container Oral TID WC  . insulin aspart  0-5 Units Subcutaneous QHS  . insulin aspart  0-9 Units Subcutaneous TID WC  . lisinopril  20 mg Oral Daily  . metFORMIN  500 mg Oral BID WC  . senna  1 tablet Oral QHS  . simvastatin  20 mg Oral q1800  . sodium chloride  500 mL Intravenous Once  . DISCONTD: sodium chloride   Intravenous Q12H  . DISCONTD: megestrol  400 mg Oral BID   Ht:  5\' 2"  (157.5 cm)  Wt:  61.8 kg (2/13)  Ideal Wt:    50 kg % Ideal Wt: 124%  Usual Wt: 160 lb May 2012 per family % Usual Wt: 85%  BMI is 24.91. Pt's weight is WNL.  Food/Nutrition Related Hx (obtained from Nutrition Assessment completed by RD on 2/15): Family at bedside and report that pt had stroke like symptoms in 5/12 and c/o problems swallowing. She then began to lose weight (15% in 9 months). Pt was eating < 1/2 of what she normally consumed. When at home pt eats a regular diet, which includes high fat foods such as biscuits, fried foods, etc.  Pt does not like milk/ensure but does like ice cream. Per son pt has not checked her blood sugar in a long time because she no longer has the strips to go with her monitor.   Labs:  CMP     Component Value Date/Time   NA 136 03/18/2011 0730   K 4.4 03/18/2011 0730   CL 106 03/18/2011 0730   CO2 21 03/18/2011 0730   GLUCOSE 108* 03/18/2011 0730   BUN 44* 03/18/2011 0730   CREATININE 1.00 03/18/2011 0730   CALCIUM 9.3 03/18/2011 0730   PROT 7.2 03/13/2011 0820   ALBUMIN 3.7 03/13/2011 0820   AST 25 03/13/2011 0820   ALT 21 03/13/2011 0820   ALKPHOS 88 03/13/2011 0820   BILITOT 0.3 03/13/2011 0820   GFRNONAA 48* 03/18/2011 0730   GFRAA 56* 03/18/2011 0730   CBG (last 3)   Basename 03/18/11 1132 03/18/11 0724 03/17/11 2058  GLUCAP 170* 115* 129*   Lab Results  Component Value Date   HGBA1C 6.7* 03/14/2011   Intake/Output: I/O last 3 completed shifts: In: 600 [P.O.:600] Out: 457 [Urine:457] Total I/O In: 360 [P.O.:360] Out: -   Diet Order: Dysphagia 2 with Nectar Liquids  Supplements/Tube Feeding:  IVF:    Estimated Nutritional Needs:   Kcal: 1350 - 1500 kcal Protein:  60 - 75 grams protein Fluid:  >1.5 L/d  Per H&P, speech therapy followed for dysphagia during acute hospitalization; placed on dysphagia 2 nectar thick liquid diet. Patient with swallow difficulty for past 6 months with weight loss and ST feels patient likely with sensory and/or motor dysphagia. Pt meets criteria for moderate malnutrition in the context of chronic illness due to hx of at least <75% consumption of her estimated needs and at least 10% wt loss within the last 6 months.  Family reports that pt is taking Mining engineer. Ate 75% of breakfast. RD encouraged thickened liquids.  NUTRITION DIAGNOSIS: -Inadequate oral intake (NI-2.1).  Status: Ongoing  RELATED TO: poor appetite  AS EVIDENCE BY: 15% wt loss PTA and family report  MONITORING/EVALUATION(Goals): Goal: Pt will meet >/= 90% of her estimated needs  Monitor: po intake, weight, labs  EDUCATION NEEDS: -No education needs identified at this time  INTERVENTION: 1. Offer Magic Cup BID; encourage liquids 2. RD to follow nutrition care plan  Dietitian #: [phone]  DOCUMENTATION CODES Per approved criteria  -Non-severe (moderate) malnutrition in the context of chronic illness    Adair Laundry 03/18/2011, 12:24 PM

## 2011-03-18 NOTE — Progress Notes (Signed)
Per State Regulation 482.30 This chart was reviewed for medical necessity with respect to the patient's Admission/Duration of stay. Pt participating in therapies. Overall mod-total assist. BPs labile, low at times. Nursing working on chronic wound care.Incontinent of bowel this AM. Meryl Dare                 Nurse Care Manager             Next Review Date: 03/21/11

## 2011-03-18 NOTE — Progress Notes (Signed)
Occupational Therapy Session Note  Patient Details  Name: Christina Pittman MRN: [id number] Date of Birth: 1919/07/11  Today's Date: 03/18/2011 Time: 1103-1140 Time Calculation (min): 37 min  Short Term Goals: Week 1:  OT Short Term Goal 1 (Week 1): Pt will transfer to toilet with steady assist. OT Short Term Goal 2 (Week 1): Pt will bathe upper body with min assist. OT Short Term Goal 3 (Week 1): Pt will bathe lower body with min assist. OT Short Term Goal 4 (Week 1): Pt will don shirt with min assist. OT Short Term Goal 5 (Week 1): Pt will don pants with min assist.  Skilled Therapeutic Interventions/Progress Updates:    Began working on selfcare retraining at wheelchair level at the sink.  Daughter present for session as well.  Pt complaining of not feeling well and her stomach bothering her.  She reported 2-3 earlier sessions of diarrhea.  Performed UB bathing with mod assist.  Max encouragement to participate.  Worked on maintain static sitting out of the back of the chair for therapist to assist with washing her back.  Performed sit to stand with max assist to help with removing night gown.  In sitting pt became unresponsive to verbal or physical stimuli.  Therapist assisted her back to bed and placed in the supine position.  Nursing immediately called and BP in supine was at 100/50.  After a minute or so pt began to respond verbally to questions but still quite fatigued.  Pt missed the remaining 20 mins of the session secondary to these medical issues.  Therapy Documentation Precautions:  Precautions Precautions: Fall Precaution Comments: impulsive Required Braces or Orthoses: No Restrictions Weight Bearing Restrictions: No  Vital Signs: Therapy Vitals BP: 100/50 mmHg (Also 99/67 in supine ) Patient Position, if appropriate: Lying Pain: Pain Assessment Pain Assessment: No/denies pain (Pt reports not feeling well, diarrhea) ADL: See FIM for current functional  status  Therapy/Group: Individual Therapy  Carlisle Torgeson OTR/L 03/18/2011, 12:01 PM

## 2011-03-19 LAB — CBC
HCT: 39.3 % (ref 36.0–46.0)
MCV: 90.6 fL (ref 78.0–100.0)
RBC: 4.34 MIL/uL (ref 3.87–5.11)
RDW: 13.7 % (ref 11.5–15.5)
WBC: 12.8 10*3/uL — ABNORMAL HIGH (ref 4.0–10.5)

## 2011-03-19 LAB — BASIC METABOLIC PANEL
CO2: 19 mEq/L (ref 19–32)
Chloride: 110 mEq/L (ref 96–112)
Creatinine, Ser: 0.94 mg/dL (ref 0.50–1.10)
GFR calc Af Amer: 61 mL/min — ABNORMAL LOW (ref >90)
Potassium: 4.2 mEq/L (ref 3.5–5.1)
Sodium: 139 mEq/L (ref 135–145)

## 2011-03-19 LAB — GLUCOSE, CAPILLARY: Glucose-Capillary: 197 mg/dL — ABNORMAL HIGH (ref 70–99)

## 2011-03-19 MED ORDER — ALPRAZOLAM 0.25 MG PO TABS
0.2500 mg | ORAL_TABLET | Freq: Three times a day (TID) | ORAL | Status: DC | PRN
  Administered 2011-03-21: 0.25 mg via ORAL
  Filled 2011-03-19: qty 1

## 2011-03-19 NOTE — Progress Notes (Signed)
Patient ID: Christina Pittman, female   DOB: [birth year], 78 y.o.   MRN: [id number] Subjective/Complaints: Review of Systems  Neurological: Positive for sensory change and focal weakness.  All other systems reviewed and are negative.  I want to go home, I didn't sleep last noc.  Objective: Vital Signs: Blood pressure 148/88, pulse 75, temperature 98.2 F (36.8 C), temperature source Oral, resp. rate 20, SpO2 95.00%. No results found.  Basename 03/19/11 0645 03/18/11 0730  WBC 12.8* 12.0*  HGB 13.3 12.6  HCT 39.3 37.3  PLT 295 276    Basename 03/18/11 0730  NA 136  K 4.4  CL 106  CO2 21  GLUCOSE 108*  BUN 44*  CREATININE 1.00  CALCIUM 9.3   CBG (last 3)   Basename 03/19/11 0735 03/18/11 2140 03/18/11 1703  GLUCAP 114* 153* 144*    Wt Readings from Last 3 Encounters:  03/13/11 61.78 kg (136 lb 3.2 oz)  10/28/10 63.957 kg (141 lb)    Physical Exam:  General appearance: alert, cooperative and no distress Head: Normocephalic, without obvious abnormality, atraumatic Eyes: conjunctivae/corneas clear. PERRL, EOM's intact. Fundi benign. Ears: normal TM's and external ear canals both ears Nose: Nares normal. Septum midline. Mucosa normal. No drainage or sinus tenderness. Throat: lips, mucosa, and tongue normal; teeth and gums normal Neck: no adenopathy, no carotid bruit, no JVD, supple, symmetrical, trachea midline and thyroid not enlarged, symmetric, no tenderness/mass/nodules Back: symmetric, no curvature. ROM normal. No CVA tenderness. Resp: clear to auscultation bilaterally Cardio: regular rate and rhythm, S1, S2 normal, no murmur, click, rub or gallop and normal apical impulse GI: soft, non-tender; bowel sounds normal; no masses,  no organomegaly Extremities: extremities normal, atraumatic, no cyanosis or edema Pulses: 2+ and symmetric Skin: Skin color, texture, turgor normal. No rashes or lesions Neurologic: very alert.  Speech clearer today. RUE 2-3/5 RLE 3+/5. Sensory  1/2 on right.  Mild right 7 .  cognitvely very alert. Incision/Wound: chronic skin changes and chronic wounds on right foot   Assessment/Plan: 1. Functional deficits secondary to left cortical/subcortical infarcts which require 3+ hours per day of interdisciplinary therapy in a comprehensive inpatient rehab setting. Physiatrist is providing close team supervision and 24 hour management of active medical problems listed below. Physiatrist and rehab team continue to assess barriers to discharge/monitor patient progress toward functional and medical goals. FIM: FIM - Bathing Bathing Steps Patient Completed: Right Arm Bathing: 1: Total-Patient completes 0-2 of 10 parts or less than 25%  FIM - Upper Body Dressing/Undressing Upper body dressing/undressing steps patient completed: Thread/unthread left sleeve of pullover shirt/dress;Put head through opening of pull over shirt/dress;Pull shirt over trunk Upper body dressing/undressing: 4: Min-Patient completed 75 plus % of tasks FIM - Lower Body Dressing/Undressing Lower body dressing/undressing steps patient completed: Thread/unthread left underwear leg;Don/Doff left sock;Don/Doff left shoe Lower body dressing/undressing: 3: Mod-Patient completed 50-74% of tasks  FIM - Toileting Toileting steps completed by patient: Performs perineal hygiene Toileting: 2: Max-Patient completed 1 of 3 steps  FIM - Diplomatic Services operational officer Devices: Psychiatrist Transfers: 3-To toilet/BSC: Mod A (lift or lower assist)  FIM - Banker Devices: Bed rails;HOB elevated Bed/Chair Transfer: 4: Supine > Sit: Min A (steadying Pt. > 75%/lift 1 leg);3: Bed > Chair or W/C: Mod A (lift or lower assist);3: Chair or W/C > Bed: Mod A (lift or lower assist)  FIM - Locomotion: Wheelchair Locomotion: Wheelchair: 1: Total Assistance/staff pushes wheelchair (Pt<25%) FIM - Locomotion: Ambulation Locomotion:  Ambulation  Assistive Devices: Other (comment) (right hand held assist) Ambulation/Gait Assistance: 3: Mod assist Locomotion: Ambulation: 1: Travels less than 50 ft with moderate assistance (Pt: 50 - 74%)  Comprehension Comprehension Mode: Auditory Comprehension: 5-Follows basic conversation/direction: With extra time/assistive device  Expression Expression Mode: Verbal Expression: 5-Expresses basic needs/ideas: With no assist  Social Interaction Social Interaction: 4-Interacts appropriately 75 - 89% of the time - Needs redirection for appropriate language or to initiate interaction.  Problem Solving Problem Solving: 2-Solves basic 25 - 49% of the time - needs direction more than half the time to initiate, plan or complete simple activities  Memory Memory: 4-Recognizes or recalls 75 - 89% of the time/requires cueing 10 - 24% of the time  1. DVT Prophylaxis/Anticoagulation: Pharmaceutical: Lovenox  2. Pain Management: use tylenol prn for LBP. Also has pain over the right first toe and metatarsals. Protect skin 3. Mood: showing some signs of depression 4. DM type 2: resumed metformin bid. cbg's showing improvement 6. Leukocytosis: WBC on steady trend upwards and some confusion reported. ua negative and CXR yesterday negative ucx still pending.. Monitor for fevers. Likely due to C Diff colitis 7. HTN: Monitor with bid checks. Continue lisinopril (reduce to 20mg ) and coreg. Patient has had labile blood pressures lately. We'll adjust regimen as needed. BP's have been generally a little low thus far. 8. Dyslipidemia: Continue zocor daily.  9. wound care: Continue pressure relief and dressing as appropriate. These wounds are chronic.  10. FEN- note eating really well. Poor appetite.  Add megace  -check labs in am  -family and staff are pushing po 11. Chronic toe ulcer- calcium alginate dressing 12.  C diff colitis flagyll started LOS (Days) 4 A FACE TO FACE EVALUATION WAS  PERFORMED  Christina Pittman E 03/19/2011, 8:22 AM

## 2011-03-19 NOTE — Progress Notes (Signed)
Occupational Therapy Session Note  Patient Details  Name: Christina Pittman MRN: [id number] Date of Birth: 09-19-1919  Today's Date: 03/19/2011 Time: 7829-5621 Time Calculation (min): 20 min  Short Term Goals: Week 1:  OT Short Term Goal 1 (Week 1): Pt will transfer to toilet with steady assist. OT Short Term Goal 2 (Week 1): Pt will bathe upper body with min assist. OT Short Term Goal 3 (Week 1): Pt will bathe lower body with min assist. OT Short Term Goal 4 (Week 1): Pt will don shirt with min assist. OT Short Term Goal 5 (Week 1): Pt will don pants with min assist.  Skilled Therapeutic Interventions/Progress Updates:    Engaged in NM re-ed with AROM/AAROM in sitting with slight resistance provided by this therapist in bicep/tricep movements.  Focus on reaching in various planes and wrist flexion with no activation at wrist at this time.  Encouraged pt to perform self-ROM exercises in between therapies.   Therapy Documentation Precautions:  Precautions Precautions: Fall Precaution Comments: right hemiplegia, decreased awareness of deficits with decreased safety awareness Required Braces or Orthoses: Yes (Walker splint on the right side.) Restrictions Weight Bearing Restrictions: No Pain: No c/o pain this session  See FIM for current functional status  Therapy/Group: Individual Therapy  Leonette Monarch 03/19/2011, 3:07 PM

## 2011-03-19 NOTE — Progress Notes (Signed)
Occupational Therapy Session Note  Patient Details  Name: Maiyah Goyne MRN: [id number] Date of Birth: [birth year]  Today's Date: 03/19/2011 Time: 1130-1200 Time Calculation (min): 30 min  Short Term Goals: Week 1:  OT Short Term Goal 1 (Week 1): Pt will transfer to toilet with steady assist. OT Short Term Goal 2 (Week 1): Pt will bathe upper body with min assist. OT Short Term Goal 3 (Week 1): Pt will bathe lower body with min assist. OT Short Term Goal 4 (Week 1): Pt will don shirt with min assist. OT Short Term Goal 5 (Week 1): Pt will don pants with min assist.  Skilled Therapeutic Interventions/Progress Updates:    [employer] group focusing on self feeding, following safe swallowing precautions, integration of the RUE, and attention.  Pt required mod instructional cues to maintain attention throughout session.  Integrated RUE for drinking with cup and max assist from therapist.  Pt with decreased awareness of swallowing precautions.  Therapy Documentation Precautions:  Precautions Precautions: Fall Precaution Comments: right hemiplegia, decreased awareness of deficits with decreased safety awareness Required Braces or Orthoses: Yes (Walker splint on the right side.) Restrictions Weight Bearing Restrictions: No  Pain: Pain Assessment Pain Assessment: Faces Pain Score: 0-No pain Faces Pain Scale: Hurts a little bit Pain Type: Acute pain Pain Location: Arm Pain Orientation: Left (Pt reports pain when washing right arm proxomal to elbow.) Pain Intervention(s): Repositioned Multiple Pain Sites: No ADL: See FIM for current functional status  Therapy/Group: Group Therapy  Maggi Hershkowitz OTR/L 03/19/2011, 1:18 PM

## 2011-03-19 NOTE — Progress Notes (Signed)
Spoke briefly with patient and her future daughter in law at bedside.  Moderate service overview given.  Information left for daughter to call me or discuss her care support needs with care management.

## 2011-03-19 NOTE — Progress Notes (Signed)
Occupational Therapy Session Note  Patient Details  Name: Christina Pittman MRN: [id number] Date of Birth: [birth year]  Today's Date: 03/19/2011 Time: 4696-2952 Time Calculation (min): 58 min  Short Term Goals: Week 1:  OT Short Term Goal 1 (Week 1): Pt will transfer to toilet with steady assist. OT Short Term Goal 2 (Week 1): Pt will bathe upper body with min assist. OT Short Term Goal 3 (Week 1): Pt will bathe lower body with min assist. OT Short Term Goal 4 (Week 1): Pt will don shirt with min assist. OT Short Term Goal 5 (Week 1): Pt will don pants with min assist.  Skilled Therapeutic Interventions/Progress Updates:    Worked on selfcare retraining at the sink sit to stand from wheelchair.  Pt currently attempts to utilize the RUE into activity but needs max assist to squeeze out washcloth or attempt to wash the left arm.  She demonstrates greater proximal shoulder movement with only minimal finger flexion present.  Requires mod assist for sit to stand from the wheelchair with mod instructional cueing for hand placement.  Pt with very impaired awareness of deficits and her abilities.  Perseverates on going home as soon as possible, and states she doesn't need help.  Pt currently needs max assist for most dressing tasks and mod assist overall for bathing.  Instructed daughter-in-law on AAROM exercises for the right arm.     Therapy Documentation Precautions:  Precautions Precautions: Fall Precaution Comments: right hemiplegia, decreased awareness of deficits with decreased safety awareness Required Braces or Orthoses: Yes (Walker splint on the right side.) Restrictions Weight Bearing Restrictions: No  Pain: Pain Assessment Pain Assessment: Faces Pain Score: 0-No pain Faces Pain Scale: Hurts a little bit Pain Type: Acute pain Pain Location: Arm Pain Orientation: Left (Pt reports pain when washing right arm proxomal to elbow.) Pain Intervention(s): Repositioned Multiple Pain Sites:  No ADL: See FIM for current functional status  Therapy/Group: Individual Therapy  Aprill Banko OTR/L 03/19/2011, 12:54 PM

## 2011-03-19 NOTE — Progress Notes (Signed)
Physical Therapy Note  Patient Details  Name: Christina Pittman MRN: [id number] Date of Birth: Jul 27, 1919 Today's Date: 03/19/2011  13:00- 14:00 group therapy pt complained of pain in her toe unrated.   Session focused on standing with RW for weight shifts, sit to stands with vc for hand placement mod assist, and stepping forward and backwards min assist. Pt tolerated standing for 1 minute intervals    Julian Reil 03/19/2011, 1:38 PM

## 2011-03-19 NOTE — Progress Notes (Signed)
Speech Language Pathology Daily Session Note  Patient Details  Name: Christina Pittman MRN: [id number] Date of Birth: 1919-06-22  Today's Date: 03/19/2011 Time: 1030-1100 Time Calculation (min): 30 min  Short Term Goals: Week 1: SLP Short Term Goal 1 (Week 1): Pt will demonstrate functional problem solving for familiar tasks with Mod A verbal cues. SLP Short Term Goal 2 (Week 1): Pt will demonstrate sustained attention to a functional task for ~10 mins with Mod A verbal cues for redirection SLP Short Term Goal 3 (Week 1): Pt will utilize external aids to increase recall of daily information with Min A verbal cues.  SLP Short Term Goal 4 (Week 1): Pt will utilize swallowing compensatory strategies with supervision verbal cues SLP Short Term Goal 5 (Week 1): pt will consume Dys. 2 textures and nectar-thick liquids without overt s/s of aspiration with supervision verbal cues.   Skilled Therapeutic Interventions: Session focused on sustained attention; impacted by fatigue today, patient required mod-max assist to attend to verbally stating a familiar recipe; max assist to verbally state 2 deficits and total assist to connect deficits to CVA.  Patient with several statements of "I want to go home."  Daily Session Precautions/Restrictions  Precautions Precautions: Fall FIM:  Comprehension Comprehension Mode: Auditory Comprehension: 5-Follows basic conversation/direction: With extra time/assistive device Expression Expression Mode: Verbal Expression: 5-Expresses basic 90% of the time/requires cueing < 10% of the time. Social Interaction Social Interaction: 4-Interacts appropriately 75 - 89% of the time - Needs redirection for appropriate language or to initiate interaction. Problem Solving Problem Solving: 2-Solves basic 25 - 49% of the time - needs direction more than half the time to initiate, plan or complete simple activities Memory Memory: 4-Recognizes or recalls 75 - 89% of the  time/requires cueing 10 - 24% of the time FIM - Eating Eating Activity: 5: Set-up assist for open containers Pain Pain Assessment Pain Assessment: No/denies pain Pain Score: 0-No pain  Therapy/Group: Individual Therapy  Christina Ferretti., CCC-SLP [phone]  Christina Pittman 03/19/2011, 4:43 PM

## 2011-03-19 NOTE — Progress Notes (Signed)
Orthopedic Tech Progress Note Patient Details:  Christina Pittman 07/26/1919 [id number]  Other Ortho Devices Ortho Device Location: panel binder Ortho Device Interventions: Application   Christina Pittman, Christina Pittman 03/19/2011, 10:32 AM

## 2011-03-19 NOTE — Progress Notes (Signed)
Speech Pathology: Dysphagia Treatment Note  1200-1225 Group Session   Patient was observed with : Dys.2 Ground and Nectar liquids.  Patient was noted to have s/s of aspiration : Yes:  Cough x1 secondary to pocketing  Lung Sounds:  WNL Temperature: WNL  Patient required: mod-min cues to consistently follow precautions/strategies  Clinical Impression: SLP facilitated session with mod cues to attend to self feeding task with mild environmental distractions; moderate semantic, demonstration cues to monitor and clear right pocketing.   Recommendations:  Continue with current plan of care  Pain:   none Intervention Required:   No  Goals: Progressing  Fae Pippin, M.A., CCC-SLP [phone]

## 2011-03-20 LAB — GLUCOSE, CAPILLARY
Glucose-Capillary: 86 mg/dL (ref 70–99)
Glucose-Capillary: 194 mg/dL — ABNORMAL HIGH (ref 70–99)

## 2011-03-20 NOTE — Progress Notes (Signed)
Social Work Patient ID: Christina Pittman, female   DOB: 06-17-1919, 78 y.o.   MRN: [id number] Met with pt and daughter to inform team conference and discharge date. Goals are supervision-min assist Level and discharge 2/26-Wednesday.  Pt reports feeling better today and her diet was increased to Dys 3. She is aware she needs to drink more and then the IV's will be discharged.  Both pleased with her progress And will work toward discharge.

## 2011-03-20 NOTE — Plan of Care (Signed)
Problem: RH Simple Meal Prep Goal: LTG Patient will perform simple meal prep w/assist (OT) LTG: Patient will perform simple meal prep with assistance, with/without cues (OT).  Outcome: Not Applicable Date Met:  03/20/11 Goal discharged

## 2011-03-20 NOTE — Progress Notes (Signed)
Speech Pathology: Dysphagia Treatment Note  1130-1200 Group Session   Patient was observed with : Dys.2, Ground and Nectar liquids.  Patient was noted to have s/s of aspiration : No  Lung Sounds:  WNL Temperature: WNL  Patient required: supervision cues to consistently follow precautions/strategies  Clinical Impression: SLP facilitated session with supervision semantic cues to self monitor and compensate for right buccal pocketing; SLP also assessed intake of Dys.3 textures which resulted is same amount of pocketing and cuing; thin liquids via cup sip resulted in no overt s/s of aspiration.  Recommendations:  Continue with current orders; objective assessment 03/21/11  Pain:   none Intervention Required:   No  Goals: Progressing  Fae Pippin, M.A., CCC-SLP [phone]

## 2011-03-20 NOTE — Progress Notes (Signed)
Physical Therapy Note  Patient Details  Name: Christina Pittman MRN: [id number] Date of Birth: 1919-06-11  Time:14:08-15:04 56 min No pain Group therapy:  Pt seen in group session for socialization and to encourage progression, addressing sit to stand transfers with mod@, dynamic balance using RW for UE support/min@ and gait training with RW x 50' with min@.  Tolerated treatment well, needing several rest breaks.  Today's Date: 03/20/2011     Georges Mouse 03/20/2011, 4:13 PM

## 2011-03-20 NOTE — Patient Care Conference (Signed)
Inpatient RehabilitationTeam Conference Note Date: 03/20/2011   Time: 11:20 AM    Patient Name: Christina Pittman      Medical Record Number: [id number]  Date of Birth: 1919-07-04 Sex: Female         Room/Bed: 4038/4038-01 Payor Info: Payor: MEDICARE  Plan: MEDICARE PART A AND B  Product Type: *No Product type*     Admitting Diagnosis: MADERA L SACATTERED CVAS  Admit Date/Time:  03/15/2011  4:48 PM Admission Comments: No comment available   Primary Diagnosis:  Stroke Principal Problem: Stroke  Patient Active Problem List  Diagnoses Date Noted  . Stroke 03/14/2011  . HTN (hypertension) 03/14/2011  . DM (diabetes mellitus screen) 03/14/2011  . DM2 (diabetes mellitus, type 2) 03/14/2011  . Insomnia 03/14/2011  . Hemiparesis, right 03/14/2011  . HLD (hyperlipidemia) 03/14/2011  . Dysphagia 03/14/2011    Expected Discharge Date: Expected Discharge Date: 03/26/11  Team Members Present: Physician: Dr. Claudette Laws Case Manager Present: Lutricia Horsfall, RN Social Worker Present: Dossie Der, LCSW Nurse Present: Gregor Hams, RN PT Present: Illene Bolus, PT;Becky Henrene Dodge, PT OT Present: Other (comment) Perrin Maltese, OT) SLP Present: Fae Pippin, SLP     Current Status/Progress Goal Weekly Team Focus  Medical   c diff colitis. on IV fluids  off IVF  retest swallow upgrade   Bowel/Bladder   contiinent of bowel and bladder with toileting  continent of bowel and bladder      Swallow/Nutrition/ Hydration   Dys.2 textures with nectar-thick liquids with full supervision and water protocol  least restrictive p.o. intake  increase awareness and self monitoring   ADL's   Pt currently needs max assist for LB selfcare and and max assist for UB dressing.  UB bathing min assist and toilet transfers mod assist.  Very poor awareness of deficits and the current need for assist with all tasks.   Demonstrates Brunnstrum stage III movement in the right arm and stage II in the hand.   Goals are overall mostly supervision level.    Selfcare retraining, neuromuscular re-education on the RUE and RLE, family education    Mobility   mod assist overall  min assist overall  activity tolerance, awareness of deficits, family education   Communication             Safety/Cognition/ Behavioral Observations  mod-max assist   supervision  increase awareness of deficits, attention   Pain   denies  3 or less on scale of 1-10      Skin                *See Interdisciplinary Assessment and Plan and progress notes for long and short-term goals  Barriers to Discharge: not taking adequate po    Possible Resolutions to Barriers:  see above    Discharge Planning/Teaching Needs:  Home with children rotating to provide 24 hour care.  Family is always here with her.      Team Discussion:  Pt has c-diff, improving. On IV fluids. Poor po intake. Nectar-thk liquids. On water protocol. Repeat MBS later this week. Min A goals. Family provided assistance prior to pta.  Revisions to Treatment Plan:  none   Continued Need for Acute Rehabilitation Level of Care: The patient requires daily medical management by a physician with specialized training in physical medicine and rehabilitation for the following conditions: Daily direction of a multidisciplinary physical rehabilitation program to ensure safe treatment while eliciting the highest outcome that is of practical value to the patient.: Yes Daily  medical management of patient stability for increased activity during participation in an intensive rehabilitation regime.: Yes Daily analysis of laboratory values and/or radiology reports with any subsequent need for medication adjustment of medical intervention for : Neurological problems  Meryl Dare 03/20/2011, 2:04 PM

## 2011-03-20 NOTE — Progress Notes (Signed)
Patient ID: Christina Pittman, female   DOB: March 28, 1919, 78 y.o.   MRN: [id number] Subjective/Complaints: Review of Systems  Neurological: Positive for sensory change and focal weakness.  All other systems reviewed and are negative.  I want to go home, I didn't sleep last noc.  Objective: Vital Signs: Blood pressure 150/88, pulse 68, temperature 98.5 F (36.9 C), temperature source Oral, resp. rate 20, SpO2 98.00%. No results found.  Basename 03/19/11 0645 03/18/11 0730  WBC 12.8* 12.0*  HGB 13.3 12.6  HCT 39.3 37.3  PLT 295 276    Basename 03/19/11 0645 03/18/11 0730  NA 139 136  K 4.2 4.4  CL 110 106  CO2 19 21  GLUCOSE 101* 108*  BUN 35* 44*  CREATININE 0.94 1.00  CALCIUM 9.3 9.3   CBG (last 3)   Basename 03/20/11 0724 03/19/11 2129 03/19/11 1152  GLUCAP 86 119* 197*    Wt Readings from Last 3 Encounters:  03/13/11 61.78 kg (136 lb 3.2 oz)  10/28/10 63.957 kg (141 lb)    Physical Exam:  General appearance: alert, cooperative and no distress Head: Normocephalic, without obvious abnormality, atraumatic Eyes: conjunctivae/corneas clear. PERRL, EOM's intact. Fundi benign. Ears: normal TM's and external ear canals both ears Nose: Nares normal. Septum midline. Mucosa normal. No drainage or sinus tenderness. Throat: lips, mucosa, and tongue normal; teeth and gums normal Neck: no adenopathy, no carotid bruit, no JVD, supple, symmetrical, trachea midline and thyroid not enlarged, symmetric, no tenderness/mass/nodules Back: symmetric, no curvature. ROM normal. No CVA tenderness. Resp: clear to auscultation bilaterally Cardio: regular rate and rhythm, S1, S2 normal, no murmur, click, rub or gallop and normal apical impulse GI: soft, non-tender; bowel sounds normal; no masses,  no organomegaly Extremities: extremities normal, atraumatic, no cyanosis or edema Pulses: 2+ and symmetric Skin: Skin color, texture, turgor normal. No rashes or lesions Neurologic: very alert.  Speech  clearer today. RUE 2-3/5 RLE 3+/5. Sensory 1/2 on right.  Mild right 7 .  cognitvely very alert. Incision/Wound: chronic skin changes and chronic wounds on right foot   Assessment/Plan: 1. Functional deficits secondary to left cortical/subcortical infarcts which require 3+ hours per day of interdisciplinary therapy in a comprehensive inpatient rehab setting. Physiatrist is providing close team supervision and 24 hour management of active medical problems listed below. Physiatrist and rehab team continue to assess barriers to discharge/monitor patient progress toward functional and medical goals. FIM: FIM - Bathing Bathing Steps Patient Completed: Chest;Right Arm;Abdomen;Right upper leg;Left upper leg Bathing: 3: Mod-Patient completes 5-7 3f 10 parts or 50-74%  FIM - Upper Body Dressing/Undressing Upper body dressing/undressing steps patient completed: Thread/unthread left sleeve of pullover shirt/dress Upper body dressing/undressing: 2: Max-Patient completed 25-49% of tasks FIM - Lower Body Dressing/Undressing Lower body dressing/undressing steps patient completed: Thread/unthread left underwear leg;Don/Doff left sock;Don/Doff left shoe Lower body dressing/undressing: 2: Max-Patient completed 25-49% of tasks  FIM - Toileting Toileting steps completed by patient: Performs perineal hygiene Toileting: 0: Activity did not occur  FIM - Diplomatic Services operational officer Devices: Psychiatrist Transfers: 0-Activity did not occur  FIM - Banker Devices: Bed rails;HOB elevated Bed/Chair Transfer: 4: Supine > Sit: Min A (steadying Pt. > 75%/lift 1 leg);3: Bed > Chair or W/C: Mod A (lift or lower assist);3: Chair or W/C > Bed: Mod A (lift or lower assist)  FIM - Locomotion: Wheelchair Locomotion: Wheelchair: 1: Total Assistance/staff pushes wheelchair (Pt<25%) FIM - Locomotion: Ambulation Locomotion: Ambulation Assistive Devices:  Other (comment) (right hand  held assist) Ambulation/Gait Assistance: 3: Mod assist Locomotion: Ambulation: 1: Travels less than 50 ft with moderate assistance (Pt: 50 - 74%)  Comprehension Comprehension Mode: Auditory Comprehension: 5-Follows basic conversation/direction: With extra time/assistive device  Expression Expression Mode: Verbal Expression: 5-Expresses basic 90% of the time/requires cueing < 10% of the time.  Social Interaction Social Interaction: 4-Interacts appropriately 75 - 89% of the time - Needs redirection for appropriate language or to initiate interaction.  Problem Solving Problem Solving: 2-Solves basic 25 - 49% of the time - needs direction more than half the time to initiate, plan or complete simple activities  Memory Memory: 4-Recognizes or recalls 75 - 89% of the time/requires cueing 10 - 24% of the time  1. DVT Prophylaxis/Anticoagulation: Pharmaceutical: Lovenox  2. Pain Management: use tylenol prn for LBP. Also has pain over the right first toe and metatarsals. Protect skin 3. Mood: showing some signs of depression 4. DM type 2: resumed metformin bid. cbg's showing improvement 6. Leukocytosis: WBC on steady trend upwards and some confusion reported. ua negative and CXR yesterday negative ucx still pending.. Monitor for fevers. Likely due to C Diff colitis 7. HTN: Monitor with bid checks. Continue lisinopril (reduce to 20mg ) and coreg. Patient has had labile blood pressures lately. We'll adjust regimen as needed. BP's have been generally a little low thus far. 8. Dyslipidemia: Continue zocor daily.  9. wound care: Continue pressure relief and dressing as appropriate. These wounds are chronic.  10. FEN- note eating really well. Poor appetite.  Add megace 10-90% meals  -check labs in am  -family and staff are pushing po fluids yesterday 11. Chronic toe ulcer- calcium alginate dressing 12.  C diff colitis flagyll started LOS (Days) 5 A FACE TO FACE  EVALUATION WAS PERFORMED  Christina Pittman 03/20/2011, 8:44 AM

## 2011-03-20 NOTE — Progress Notes (Signed)
Occupational Therapy Session Note  Patient Details  Name: Christina Pittman MRN: [id number] Date of Birth: Jun 09, 1919  Today's Date: 03/20/2011 Time: 2952-8413 Time Calculation (min): 58 min  Short Term Goals: Week 1:  OT Short Term Goal 1 (Week 1): Pt will transfer to toilet with steady assist. OT Short Term Goal 2 (Week 1): Pt will bathe upper body with min assist. OT Short Term Goal 3 (Week 1): Pt will bathe lower body with min assist. OT Short Term Goal 4 (Week 1): Pt will don shirt with min assist. OT Short Term Goal 5 (Week 1): Pt will don pants with min assist.  Skilled Therapeutic Interventions/Progress Updates:   Bathing and dressing at wheelchair level at the sink.  Pt requires max assistance to utilize the RUE for bathing and dressing tasks as an active assist.  Able to perform sit to stand with min assist for LB selfcare tasks.  Pt did improve today with bathing and dressing from yesterday with amount of active participation, however she still needs max assist for LB dressing and UB dressing.  She is able to cross her right LE and donn her pants over the right leg with mod instructional cueing for hemi technique.  For UB dressing is able to perform parts of threading both arms but needs assist to complete this as well as pulling it over her head.  Pt doing better, still needs extensive rehab to increase independence level to at least min assist.    Therapy Documentation Precautions:  Precautions Precautions: Fall Precaution Comments: right hemiplegia, decreased awareness of deficits with decreased safety awareness Required Braces or Orthoses: Yes Restrictions Weight Bearing Restrictions: No  Pain: Pain Assessment Pain Assessment: No/denies pain Pain Score: 0-No pain Faces Pain Scale: No hurt ADL: See FIM for current functional status  Therapy/Group: Individual Therapy  Tarah Buboltz,JAMESOTR/L 03/20/2011, 11:05 AM

## 2011-03-20 NOTE — Progress Notes (Addendum)
Speech Language Pathology Daily Session Note  Patient Details  Name: Christina Pittman MRN: [id number] Date of Birth: Dec 28, 1919  Today's Date: 03/20/2011 Time: 1330-1400 Time Calculation (min): 30 min  Short Term Goals: Week 1: SLP Short Term Goal 1 (Week 1): Pt will demonstrate functional problem solving for familiar tasks with Mod A verbal cues. SLP Short Term Goal 2 (Week 1): Pt will demonstrate sustained attention to a functional task for ~10 mins with Mod A verbal cues for redirection SLP Short Term Goal 3 (Week 1): Pt will utilize external aids to increase recall of daily information with Min A verbal cues.  SLP Short Term Goal 4 (Week 1): Pt will utilize swallowing compensatory strategies with supervision verbal cues SLP Short Term Goal 5 (Week 1): pt will consume Dys. 2 textures and nectar-thick liquids without overt s/s of aspiration with supervision verbal cues.   Skilled Therapeutic Interventions: Patient consumed regular textures with prolonged mastication and minimal assist semantic cues faded to supervision cues to monitor and compensate for right buccal pocketing.  Patient sustained attention to task for 15-20 minutes with no cuing; mod-max assist for awareness of deficits and relating them to CVA.  Upgraded orders to a Dys.3 texture diet.  Daily Session Precautions/Restrictions    FIM:  Comprehension Comprehension Mode: Auditory Comprehension: 5-Follows basic conversation/direction: With extra time/assistive device Expression Expression Mode: Verbal Expression: 5-Expresses basic needs/ideas: With extra time/assistive device Social Interaction Social Interaction: 5-Interacts appropriately 90% of the time - Needs monitoring or encouragement for participation or interaction. Problem Solving Problem Solving: 3-Solves basic 50 - 74% of the time/requires cueing 25 - 49% of the time Memory Memory: 4-Recognizes or recalls 75 - 89% of the time/requires cueing 10 - 24% of the  time General    Pain Pain Assessment Pain Assessment: No/denies pain Pain Score: 0-No pain  Therapy/Group: Individual Therapy  Charlane Ferretti., CCC-SLP [phone]  Christina Pittman 03/20/2011, 3:43 PM

## 2011-03-20 NOTE — Progress Notes (Signed)
Occupational Therapy Note  Patient Details  Name: Christina Pittman MRN: [id number] Date of Birth: Oct 25, 1919 Today's Date: 03/20/2011  Time: 1200-1220 Pt denies pain Group Therapy  Pt participated in self feeding group with focus on RUE use, swallowing strategies, and awareness.  Pt states that there "is nothing wrong with me" but is unable to use RUE functionally during meal.  Pt able to complete self feeding tasks using LUE.   Lavone Neri [facility] 03/20/2011, 4:08 PM

## 2011-03-21 ENCOUNTER — Inpatient Hospital Stay (HOSPITAL_COMMUNITY): Payer: Medicare Other

## 2011-03-21 LAB — GLUCOSE, CAPILLARY
Glucose-Capillary: 94 mg/dL (ref 70–99)
Glucose-Capillary: 177 mg/dL — ABNORMAL HIGH (ref 70–99)

## 2011-03-21 LAB — BASIC METABOLIC PANEL
BUN: 25 mg/dL — ABNORMAL HIGH (ref 6–23)
Calcium: 9.2 mg/dL (ref 8.4–10.5)
Creatinine, Ser: 0.94 mg/dL (ref 0.50–1.10)
GFR calc non Af Amer: 52 mL/min — ABNORMAL LOW (ref >90)
Glucose, Bld: 96 mg/dL (ref 70–99)

## 2011-03-21 NOTE — Progress Notes (Signed)
Physical Therapy Weekly Progress Note  Patient Details  Name: Christina Pittman MRN: [id number] Date of Birth: 1919-04-02  Today's Date: 03/21/2011  Patient is an 78 y.o female admitted to [facility] s/p acute left CVA. Patient has made good progress with PT this week, from max assist for all mobility to current min-mod assist level for transfers and min-mod assist for gait with RW x 70 feet. Level of assistance varies with level of sustained attention to task and endurance at this time. Patient continues to demonstrate little to no intellectual and higher level awareness of deficits and decreased problem solving, so self correction for safety and balance during mobility is not currently occuring. As a result, anticipate patient will require min assist for mobility at discharge and long term goals have been downgraded from supervision to min assist level overall with use of RW.  Patient will continue to benefit from skilled PT intervention to enhance overall performance with activity tolerance, balance, postural control, ability to compensate for deficits, functional use of  right upper extremity and right lower extremity, attention, awareness and coordination and complete family education for safe discharge home with anticipated 24/7 min assist.  Patient progressing toward new long term goals.  Continue plan of care.   See FIM for current functional status    Romeo Rabon 03/21/2011, 5:13 PM

## 2011-03-21 NOTE — Progress Notes (Signed)
Physical Therapy Note  Patient Details  Name: Christina Pittman MRN: [id number] Date of Birth: May 23, 1919 Today's Date: 03/21/2011  1530-1625 (55 minutes) group Pain- no complaint of pain Treatment: Pt participated in PT gait group to improve gait safety/endurance. Pt ambulates 74 feet , 78 feet , 70 feet with min/mod assist sit to stand and min assist for gait with RW. Pt requires vcs for walker safety (staying within AD during gait and turning) and assist to guide AD.   Domanik [place],JIM 03/21/2011, 4:30 PM

## 2011-03-21 NOTE — Progress Notes (Signed)
SLP Cancellation Note  Treatment cancelled today due to patient's refusal to participate due to fatigue; as a result patient missed 30 minutes of skilled therapy in [employer].  Fae Pippin, M.A., CCC-SLP [phone]

## 2011-03-21 NOTE — Progress Notes (Signed)
Patient ID: Christina Pittman, female   DOB: 1919/09/13, 78 y.o.   MRN: [id number] Subjective/Complaints: Review of Systems  Neurological: Positive for sensory change and focal weakness.  All other systems reviewed and are negative.  Brighter today.  I have bread!Marland Kitchen  MBS today still on Nectars.  BUN improved Objective: Vital Signs: Blood pressure 164/91, pulse 76, temperature 98.6 F (37 C), temperature source Oral, resp. rate 18, weight 62.687 kg (138 lb 3.2 oz), SpO2 98.00%. No results found.  Basename 03/19/11 0645  WBC 12.8*  HGB 13.3  HCT 39.3  PLT 295    Basename 03/21/11 0613 03/19/11 0645  NA 135 139  K 4.9 4.2  CL 107 110  CO2 20 19  GLUCOSE 96 101*  BUN 25* 35*  CREATININE 0.94 0.94  CALCIUM 9.2 9.3   CBG (last 3)   Basename 03/21/11 0718 03/20/11 2123 03/20/11 1044  GLUCAP 94 117* 194*    Wt Readings from Last 3 Encounters:  03/20/11 62.687 kg (138 lb 3.2 oz)  03/13/11 61.78 kg (136 lb 3.2 oz)  10/28/10 63.957 kg (141 lb)    Physical Exam:  General appearance: alert, cooperative and no distress Head: Normocephalic, without obvious abnormality, atraumatic Eyes: EOMI and visual fields intact Nose: Nares normal. Septum midline. Mucosa normal. No drainage or sinus tenderness. Throat: lips, mucosa, and tongue normal; teeth and gums normal Neck: no adenopathy, no carotid bruit, no JVD, supple, symmetrical, trachea midline and thyroid not enlarged, symmetric, no tenderness/mass/nodules Back: symmetric, no curvature. ROM normal. No CVA tenderness. Resp: clear to auscultation bilaterally Cardio: regular rate and rhythm, S1, S2 normal, no murmur, click, rub or gallop and normal apical impulse GI: soft, non-tender; bowel sounds normal; no masses,  no organomegaly Extremities: extremities normal, atraumatic, no cyanosis or edema Pulses: 2+ and symmetric Skin: Skin color, texture, turgor normal. No rashes or lesions Neurologic: very alert.  Speech clearer today. RUE3/5  1/5 R grip RLE 3+/5. Sensory 1/2 on right.  Mild right 7 .  cognitvely very alert. Incision/Wound: chronic skin changes and chronic wounds on right foot   Assessment/Plan: 1. Functional deficits secondary to left cortical/subcortical infarcts which require 3+ hours per day of interdisciplinary therapy in a comprehensive inpatient rehab setting. Physiatrist is providing close team supervision and 24 hour management of active medical problems listed below. Physiatrist and rehab team continue to assess barriers to discharge/monitor patient progress toward functional and medical goals. FIM: FIM - Bathing Bathing Steps Patient Completed: Chest;Right Arm;Abdomen;Right upper leg;Left upper leg;Left lower leg (including foot) Bathing: 3: Mod-Patient completes 5-7 86f 10 parts or 50-74%  FIM - Upper Body Dressing/Undressing Upper body dressing/undressing steps patient completed: Thread/unthread left bra strap Upper body dressing/undressing: 2: Max-Patient completed 25-49% of tasks FIM - Lower Body Dressing/Undressing Lower body dressing/undressing steps patient completed: Thread/unthread right pants leg;Thread/unthread left pants leg Lower body dressing/undressing: 2: Max-Patient completed 25-49% of tasks  FIM - Toileting Toileting steps completed by patient: Performs perineal hygiene Toileting: 0: Activity did not occur  FIM - Diplomatic Services operational officer Devices: Bedside commode Toilet Transfers DO NOT USE: 0-Activity did not occur  FIM - Banker Devices: Bed rails;HOB elevated Bed/Chair Transfer: 0: Activity did not occur  FIM - Locomotion: Wheelchair Locomotion: Wheelchair: 1: Total Assistance/staff pushes wheelchair (Pt<25%) FIM - Locomotion: Ambulation Locomotion: Ambulation Assistive Devices: Other (comment) (right hand held assist) Ambulation/Gait Assistance: 3: Mod assist Locomotion: Ambulation: 1: Travels less than 50 ft with  moderate assistance (Pt: 50 -  74%)  Comprehension Comprehension Mode: Auditory Comprehension: 5-Follows basic conversation/direction: With extra time/assistive device  Expression Expression Mode: Verbal Expression: 5-Expresses basic needs/ideas: With extra time/assistive device  Social Interaction Social Interaction: 5-Interacts appropriately 90% of the time - Needs monitoring or encouragement for participation or interaction.  Problem Solving Problem Solving: 3-Solves basic 50 - 74% of the time/requires cueing 25 - 49% of the time  Memory Memory: 4-Recognizes or recalls 75 - 89% of the time/requires cueing 10 - 24% of the time  1. DVT Prophylaxis/Anticoagulation: Pharmaceutical: Lovenox  2. Pain Management: use tylenol prn for LBP. Also has pain over the right first toe and metatarsals. Protect skin 3. Mood: showing some signs of depression 4. DM type 2: resumed metformin bid. cbg's showing improvement 6. Leukocytosis: WBC  due to C Diff colitis 7. HTN: Monitor with bid checks. Continue lisinopril (reduce to 20mg ) and coreg. Patient has had labile blood pressures lately. We'll adjust regimen as needed. BP's have been generally a little low thus far. 8. Dyslipidemia: Continue zocor daily.  9. wound care: Continue pressure relief and dressing as appropriate. These wounds are chronic.  10. FEN- note eating really well.  Appetite improving.  off megace 10-90% meals  -D/C IVF  -family and staff are pushing po fluids yesterday 11. Chronic toe ulcer- calcium alginate dressing 12.  C diff colitis flagyll started LOS (Days) 6 A FACE TO FACE EVALUATION WAS PERFORMED  Christina Pittman E 03/21/2011, 8:07 AM

## 2011-03-21 NOTE — Progress Notes (Addendum)
Occupational Therapy Weekly Progress Note  Patient Details  Name: Christina Pittman MRN: [id number] Date of Birth: 03-12-1919  Today's Date: 03/21/2011 Time: 4098-1191 Time Calculation (min): 48 min  Patient has met 1 of 5 short term goals.    Patient continues to demonstrate the following deficits: decreased balance, decreased right UE and LE strength, decreased endurance, and therefore will continue to benefit from skilled OT intervention to enhance overall performance with BADL.  Patient progressing toward long term goals..  Plan of care revisions: Long term goals were downgraded based on pt's current progress, increased amount of assist to be provided at home and decreased LOS..  OT Short Term Goals Week 1:  OT Short Term Goal 1 (Week 1): Pt will transfer to toilet with steady assist. OT Short Term Goal 1 - Progress (Week 1): Not met OT Short Term Goal 2 (Week 1): Pt will bathe upper body with min assist. OT Short Term Goal 2 - Progress (Week 1): Met OT Short Term Goal 3 (Week 1): Pt will bathe lower body with min assist. OT Short Term Goal 3 - Progress (Week 1): Not met OT Short Term Goal 4 (Week 1): Pt will don shirt with min assist. OT Short Term Goal 4 - Progress (Week 1): Not met OT Short Term Goal 5 (Week 1): Pt will don pants with min assist. OT Short Term Goal 5 - Progress (Week 1): Not met  Skilled Therapeutic Interventions/Progress Updates:    Pt overall making steady progress with basic selfcare tasks.  Currently at a mod assist for bathing and dressing tasks.  Able to use the RUE at an active assist level with mod to max assist depending on the activity.   Currently demonstrates Brunnstrum stage III in the right arm and stage II in the hand.  She is able to exhibit some digit flexion but no extension at this time.  Continues to need min to mod assist for sit to stand during LB selfcare with mod assist needed for pulling pants over hips.  Currently using RW with hand splint on the  right side for all stand pivot transfers and mobility.  Goals downgraded to min assist level secondary to pt wanting to D/C as soon as possible and plans on having 24 hour assist.  Recommend continued OT to help progress to min assist level with basic selfcare tasks and educated family on safe use of DME and how to assist pt.   OT Treatment Session Worked on B/D at sink level per pt request.  Focused on integration of the RUE into bathing task.  Pt needs mod assist to utilize for washing and max assist for squeezing out the washcloth.  Able to bathe sit to stand with min assist.  Min assist for sit to stand from the wheelchair.  Requires assistance with fastening bra and getting the straps up her left shoulder.  Mod instructional cues for hemi techniques to donn LB clothing and to donn pullover shirt.  Pt very fatigued this am.  Requested to lay down at end of session.  Pt transferred to bed and worked on [employer] exercises for shoulder flexion and elbow extension.  Had pt position her arm at 90 degrees shoulder flexion and maintain elbow extension.  Able to hold for 3-5 seconds before it began to fall.  Therapy Documentation Precautions:  Precautions Precautions: Fall Precaution Comments: right hemiplegia, decreased awareness of deficits with decreased safety awareness Required Braces or Orthoses: Yes Restrictions Weight Bearing Restrictions: No  Vital Signs: Therapy Vitals BP: 103/60 mmHg Patient Position, if appropriate: Lying Pain: Pain Assessment Pain Assessment: No/denies pain ADL: ADL Eating: Supervision/safety Where Assessed-Eating: Wheelchair Grooming: Minimal assistance Where Assessed-Grooming: Sitting at sink;Wheelchair Upper Body Bathing: Minimal assistance Where Assessed-Upper Body Bathing: Sitting at sink;Wheelchair Lower Body Bathing: Moderate assistance Where Assessed-Lower Body Bathing: Standing at sink;Sitting at sink;Wheelchair Upper Body Dressing: Moderate  assistance;Moderate cueing Where Assessed-Upper Body Dressing: Wheelchair;Sitting at sink Lower Body Dressing: Moderate cueing;Moderate assistance Where Assessed-Lower Body Dressing: Standing at sink;Sitting at sink;Wheelchair Toileting: Moderate assistance Where Assessed-Toileting: Bedside Commode Toilet Transfer: Minimal assistance Toilet Transfer Method: Ambulating (Using RW with hand splint on the right side.) Toilet Transfer Equipment: Grab bars Tub/Shower Transfer: Not assessed Film/video editor: Not assessed  See FIM for current functional status  Therapy/Group: Individual Therapy  Bridgid Printz OTR/L 03/21/2011, 12:57 PM

## 2011-03-21 NOTE — Progress Notes (Signed)
Per State Regulation 482.30 This chart was reviewed for medical necessity with respect to the patient's Admission/Duration of stay. Pt participating in therapies and progressing toward goals. IV fluids d/c'd. Diet upgraded to regular after MBS today.  Meryl Dare                 Nurse Care Manager            Next Review Date: 03/25/11

## 2011-03-21 NOTE — Procedures (Signed)
Modified Barium Swallow Procedure Note Patient Details  Name: Christina Pittman MRN: [id number] Date of Birth: [birth year]  Today's Date: 03/21/2011 Time: 0930-1000 Time Calculation (min): 30 min  Past Medical History:  Past Medical History  Diagnosis Date  . Arthritis   . Diabetes mellitus   . Hypertension   . Heart murmur    Past Surgical History:  Past Surgical History  Procedure Date  . Abdominal hysterectomy    HPI:  78 year old right-handed female admitted February 13 with right-sided weakness and slurred speech. MRI of the brain showed acute nonhemorrhagic scattered small infarcts involving portions of the left sub-insular region, left external capsule, posterior left corona radiata and left parietal lobe. 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 Carotid Dopplers with no significant internal carotid artery stenosis. Echocardiogram done revealing moderate LVH with EF 65-70 % and moderate to severe aortic stenosis. Neurology was consulted recommends continuing plavix Therapy and aggressive risk factor control. It was felt that infarcts most likely secondary to embolic source however not a Coumadin candidate due to fall risk. There is no current mention of plan for pituitary tumor identified for MRI. Pt had MBSS on 03/15/11 and recommended a Dys. 2 diet with nectar thick liquids. Pt transferred to CIR 03/15/11 and an objective assess is needed to assess for presence of silent penetration/aspiraiton.     Recommendation/Prognosis  Clinical Impression Dysphagia Diagnosis: Within Functional Limits;Mild oral phase dysphagia Clinical impression: Christina Pittman swallow is characteized by prolonged mastication of regular textures and diffiulty posteriorly  transiting pills, which family confirms was her baseline function.  However, her  right sided oral weakaness which results in pocketing is consistent with mild sensori-motor oral dysphagia and she continues to require cues to self monitor and utilize strategies.  Her swallow is also characterized by preature spillage to the pyriform sinuses with all liquid consistencies tested; however there was no penetration or aspiraiotn observed during the study.  Overall, functional improvements noted from previous study and no follow up MBSS warranted. Swallow Evaluation Recommendations Solid Consistency: Regular Liquid Consistency: Thin Liquid Administration via: Cup;Straw Medication Administration: Whole meds with puree Supervision: Patient able to self feed;Full supervision/cueing for compensatory strategies Compensations: Small sips/bites;Check for pocketing Postural Changes and/or Swallow Maneuvers: Out of bed for meals Oral Care Recommendations: Oral care BID Follow up Recommendations: None Prognosis Prognosis for Safe Diet Advancement: Good Individuals Consulted Consulted and Agree with Results and Recommendations: Patient  SLP Assessment/Plan Speech Therapy Frequency: min 5x/week Duration: 1 week Potential to Achieve Goals: Good Potential Considerations: Ability to learn/carryover information  SLP Goals  SLP Swallowing Goals Patient will consume recommended diet without observed clinical signs of aspiration with: Modified independent assistance Patient will utilize recommended strategies during swallow to increase swallowing safety with: Modified independent assistance  General:  Date of Onset: 03/13/11 HPI: 78 year old right-handed female admitted February 13 with right-sided weakness and slurred speech. MRI of the brain showed acute nonhemorrhagic scattered small infarcts involving portions of the left sub-insular region, left external capsule, posterior left corona radiata and left parietal lobe. There was also incidental finding of a 2 cm pituitary mass suggestive of  pituitary macroadenoma with extension into the undersurface of the optic chiasm as well as remote basal ganglia mid corona radiata infarct. Carotid Dopplers with no significant internal carotid artery stenosis. Echocardiogram done revealing moderate LVH with EF 65-70 %  and moderate to severe aortic stenosis. Neurology was consulted recommends continuing plavix Therapy and aggressive risk factor control. It was felt that infarcts most likely secondary to embolic source however not a Coumadin candidate due to fall risk. There is no current mention of plan for pituitary tumor identified for MRI. Pt had MBSS on 03/15/11 and recommended a Dys. 2 diet with nectar thick liquids. Pt transferred to CIR 03/15/11 and an objective assess is needed to assess for presence of silent penetration/aspiraiton. Type of Study: Repeat MBS Diet Prior to this Study: Dysphagia 3 (soft);Nectar-thick liquids Temperature Spikes Noted: No Respiratory Status: Room air History of Intubation: No Behavior/Cognition: Alert;Cooperative;Distractible Oral Cavity - Dentition: Edentulous Oral Motor / Sensory Function:  (impaired but suspect at baseline) Vision: Functional for self-feeding Patient Positioning: Upright in chair Baseline Vocal Quality: Clear Volitional Cough: Strong Volitional Swallow: Able to elicit Anatomy: Within functional limits Pharyngeal Secretions: Not observed secondary MBS  Reason for Referral:  Assess for presence of silent aspiration    Oral Phase Oral Preparation/Oral Phase Oral Phase:  (edentelous prolonged mastication, baseline) Oral - Pudding Oral - Pudding Teaspoon: Not Tested Oral - Nectar Oral - Nectar Cup: Within functional limits Oral - Thin Oral - Thin Teaspoon: Not tested Oral - Thin Cup: Within functional limits Oral - Thin Straw: Within functional limits Oral - Solids Oral - Regular: Right pocketing in lateral sulci Oral - Pill: Other (Comment);Reduced posterior propulsion (required  puree to transit) Pharyngeal Phase  Pharyngeal Phase Pharyngeal Phase: Impaired Pharyngeal - Nectar Pharyngeal - Nectar Cup: Premature spillage to pyriform;Within functional limits Pharyngeal - Thin Pharyngeal - Thin Teaspoon: Not tested Pharyngeal - Thin Cup: Within functional limits;Premature spillage to pyriform Pharyngeal - Thin Straw: Within functional limits;Premature spillage to pyriform Pharyngeal - Solids Pharyngeal - Regular: Within functional limits;Premature spillage to valleculae;Delayed swallow initiation Pharyngeal - Pill: Within functional limits Cervical Esophageal Phase  Cervical Esophageal Phase Cervical Esophageal Phase: [facility]  Fae Pippin, M.A., CCC-SLP [phone]  Genesee Nase 03/21/2011, 10:23 AM

## 2011-03-21 NOTE — Progress Notes (Signed)
Occupational Therapy Session Note  Patient Details  Name: Christina Pittman MRN: [id number] Date of Birth: [birth year]  Today's Date: 03/21/2011 Time: 4098-1191 Time Calculation (min): 31 min  Short Term Goals: Short term goals for week 2 equal min assist level for bathing, dressing and toileting.  Skilled Therapeutic Interventions/Progress Updates:    Practiced simulated walk-in shower transfers using a rolling walker with hand splint on the right and tub bench.  Pt overall min assist level with mod instructional cues for sequencing walker and positioning the LUE in her lap.  She attempts to use the RUE for sit to stand but lacks enough digit flexion and shoulder extension to keep on the arm of her wheelchair to push without therapist assistance.  Pt exhibits flexed trunk and head in standing and requires min assist to help turn the walker on the carpeted surface.  Also worked on brushing her hair using the RUE.  Requires max assist overall to accomplish this task while sitting in the wheelchair.  Therapy Documentation Precautions:  Precautions Precautions: Fall Precaution Comments: right hemiplegia, decreased awareness of deficits with decreased safety awareness Required Braces or Orthoses: Yes Restrictions Weight Bearing Restrictions: No  Vital Signs: Therapy Vitals Temp: 97.2 F (36.2 C) Temp src: Oral Pulse Rate: 87  BP: 140/84 mmHg Patient Position, if appropriate: Lying Oxygen Therapy SpO2: 97 % O2 Device: None (Room air) Pain: Pain Assessment Pain Assessment: No/denies pain ADL: See FIM for current functional status  Therapy/Group: Individual Therapy  Kristoffer Bala OTR/L 03/21/2011, 4:42 PM

## 2011-03-22 LAB — GLUCOSE, CAPILLARY
Glucose-Capillary: 146 mg/dL — ABNORMAL HIGH (ref 70–99)
Glucose-Capillary: 218 mg/dL — ABNORMAL HIGH (ref 70–99)
Glucose-Capillary: 131 mg/dL — ABNORMAL HIGH (ref 70–99)

## 2011-03-22 MED ORDER — CARVEDILOL 25 MG PO TABS
25.0000 mg | ORAL_TABLET | Freq: Two times a day (BID) | ORAL | Status: DC
  Administered 2011-03-22 – 2011-03-26 (×9): 25 mg via ORAL
  Filled 2011-03-22 (×11): qty 1

## 2011-03-22 MED ORDER — METFORMIN HCL 850 MG PO TABS
850.0000 mg | ORAL_TABLET | Freq: Two times a day (BID) | ORAL | Status: DC
  Administered 2011-03-22 – 2011-03-24 (×6): 850 mg via ORAL
  Filled 2011-03-22 (×11): qty 1

## 2011-03-22 NOTE — Progress Notes (Signed)
Occupational Therapy Session Note  Patient Details  Name: Christina Pittman MRN: [id number] Date of Birth: 1919-12-05  Today's Date: 03/22/2011 Time: 4696-2952 Time Calculation (min): 35 min   Skilled Therapeutic Interventions/Progress Updates:    Pt much better this pm.  BP supine 102/70 and 145/88 sitting.  Utilized rolling walker for mobility to the sink and to walk outside of the door.   Able to perform sit to stand with mod instructional cues for hand placement and min assist.  Performed grooming activity at the sink with max assist to hold on to the brush and brush her hair.  Able to tolerate standing for 3-4 mins each interval without difficulty.  Educated pt's family on safe assist with AAROM exercises for the right arm.  Pt paced in wheelchair at end of session since she had not been up for any duration since the am.  Therapy Documentation Precautions:  Precautions Precautions: Fall Precaution Comments: right hemiplegia, decreased awareness of deficits with decreased safety awareness Required Braces or Orthoses: Yes Restrictions Weight Bearing Restrictions: No  Vital Signs: Therapy Vitals BP: 145/88 mmHg (102/70 supine) Patient Position, if appropriate: Sitting Oxygen Therapy SpO2: 97 % O2 Device: None (Room air) Pulse Oximetry Type: Intermittent Pain: Pain Assessment Pain Assessment: No/denies pain (unable to tolerate BP cuff on uper arm.) ADL: See FIM for current functional status  Therapy/Group: Individual Therapy  Edgard Debord OTR/L 03/22/2011, 3:33 PM

## 2011-03-22 NOTE — Progress Notes (Signed)
Patient ID: Christina Pittman, female   DOB: [birth year], 78 y.o.   MRN: [id number] Subjective/Complaints: Review of Systems  Neurological: Positive for sensory change and focal weakness.  All other systems reviewed and are negative.   Objective: Vital Signs: Blood pressure 161/88, pulse 83, temperature 98.3 F (36.8 C), temperature source Oral, resp. rate 18, weight 62.687 kg (138 lb 3.2 oz), SpO2 97.00%. Dg Swallowing Func-no Report  03/21/2011  CLINICAL DATA: assess for silent aspiration   FLUOROSCOPY FOR SWALLOWING FUNCTION STUDY:  Fluoroscopy was provided for swallowing function study, which was  administered by a speech pathologist.  Final results and recommendations  from this study are contained within the speech pathology report.     No results found for this basename: WBC:2,HGB:2,HCT:2,PLT:2 in the last 72 hours  Basename 03/21/11 0613  NA 135  K 4.9  CL 107  CO2 20  GLUCOSE 96  BUN 25*  CREATININE 0.94  CALCIUM 9.2   CBG (last 3)   Basename 03/21/11 2110 03/21/11 1626 03/21/11 1135  GLUCAP 177* 211* 175*    Wt Readings from Last 3 Encounters:  03/20/11 62.687 kg (138 lb 3.2 oz)  03/13/11 61.78 kg (136 lb 3.2 oz)  10/28/10 63.957 kg (141 lb)    Physical Exam:  General appearance: alert, cooperative and no distress Head: Normocephalic, without obvious abnormality, atraumatic Eyes: EOMI and visual fields intact Nose: Nares normal. Septum midline. Mucosa normal. No drainage or sinus tenderness. Throat: lips, mucosa, and tongue normal; teeth and gums normal Neck: no adenopathy, no carotid bruit, no JVD, supple, symmetrical, trachea midline and thyroid not enlarged, symmetric, no tenderness/mass/nodules Back: symmetric, no curvature. ROM normal. No CVA tenderness. Resp: clear to auscultation bilaterally Cardio: regular rate and rhythm, S1, S2 normal, no murmur, click, rub or gallop and normal apical impulse GI: soft, non-tender; bowel sounds normal; no masses,  no  organomegaly Extremities: extremities normal, atraumatic, no cyanosis or edema Pulses: 2+ and symmetric Skin: Skin color, texture, turgor normal. No rashes or lesions Neurologic: very alert.  Speech clearer today. RUE3/5 1/5 R grip RLE 3+/5. Sensory 1/2 on right.  Mild right 7 .  cognitvely very alert. Incision/Wound: chronic skin changes and chronic wounds on right foot   Assessment/Plan: 1. Functional deficits secondary to left cortical/subcortical infarcts which require 3+ hours per day of interdisciplinary therapy in a comprehensive inpatient rehab setting. Physiatrist is providing close team supervision and 24 hour management of active medical problems listed below. Physiatrist and rehab team continue to assess barriers to discharge/monitor patient progress toward functional and medical goals. FIM: FIM - Bathing Bathing Steps Patient Completed: Chest;Right Arm;Abdomen;Right upper leg;Left upper leg;Right lower leg (including foot) Bathing: 3: Mod-Patient completes 5-7 64f 10 parts or 50-74%  FIM - Upper Body Dressing/Undressing Upper body dressing/undressing steps patient completed: Put head through opening of pull over shirt/dress;Pull shirt over trunk Upper body dressing/undressing: 3: Mod-Patient completed 50-74% of tasks FIM - Lower Body Dressing/Undressing Lower body dressing/undressing steps patient completed: Thread/unthread right pants leg;Thread/unthread left pants leg;Don/Doff right shoe;Don/Doff left shoe Lower body dressing/undressing: 3: Mod-Patient completed 50-74% of tasks  FIM - Toileting Toileting steps completed by patient: Adjust clothing prior to toileting Toileting Assistive Devices: Grab bar or rail for support Toileting: 6: More than reasonable amount of time  FIM - Diplomatic Services operational officer Devices: Psychiatrist Transfers: 0-Activity did not occur Toilet Transfers DO NOT USE: 0-Activity did not occur  FIM - Physiological scientist Devices: Bed rails;HOB elevated  Bed/Chair Transfer: 3: Chair or W/C > Bed: Mod A (lift or lower assist);4: Sit > Supine: Min A (steadying pt. > 75%/lift 1 leg)  FIM - Locomotion: Wheelchair Locomotion: Wheelchair: 1: Total Assistance/staff pushes wheelchair (Pt<25%) FIM - Locomotion: Ambulation Locomotion: Ambulation Assistive Devices: Walker - Rolling (splint RT) Ambulation/Gait Assistance: 4: Min assist Locomotion: Ambulation: 2: Travels 50 - 149 ft with minimal assistance (Pt.>75%)  Comprehension Comprehension Mode: Auditory Comprehension: 5-Follows basic conversation/direction: With extra time/assistive device  Expression Expression Mode: Verbal Expression: 5-Expresses basic needs/ideas: With extra time/assistive device  Social Interaction Social Interaction: 5-Interacts appropriately 90% of the time - Needs monitoring or encouragement for participation or interaction.  Problem Solving Problem Solving: 3-Solves basic 50 - 74% of the time/requires cueing 25 - 49% of the time  Memory Memory: 4-Recognizes or recalls 75 - 89% of the time/requires cueing 10 - 24% of the time  1. DVT Prophylaxis/Anticoagulation: Pharmaceutical: Lovenox  2. Pain Management: use tylenol prn for LBP. Also has pain over the right first toe and metatarsals. Protect skin 3. Mood: showing some signs of depression 4. DM type 2: resumed metformin bid. cbg's showing improvement but still above goal , increase metformin check BMET in am 6. Leukocytosis: WBC  due to C Diff colitis 7. HTN: Monitor with bid checks. Continue lisinopril (reduce to 20mg ) and increase coreg.monitor orthostasis 8. Dyslipidemia: Continue zocor daily.  9. wound care: Continue pressure relief and dressing as appropriate. These wounds are chronic.  10. FEN- note eating really well.  Appetite improving.  off megace 10-90% meals  -D/C IVF  -family and staff are pushing po fluids yesterday 11.  Chronic toe ulcer- calcium alginate dressing 12.  C diff colitis flagyll started LOS (Days) 7 A FACE TO FACE EVALUATION WAS PERFORMED  Arlyn Buerkle E 03/22/2011, 7:40 AM

## 2011-03-22 NOTE — Progress Notes (Addendum)
Nutrition Follow-up  Pt advanced to Regular diet on 2/21 s/p MBSS. IVF d/c'd. Eating better with diet advancement, family confirms. Ate 100% of breakfast. Taking Pudding as scheduled, per family at bedside.  Diet Order:  Regular Supplements: Ensure Pudding PO TID  Meds: Scheduled Meds:   . carvedilol  25 mg Oral BID WC  . clopidogrel  75 mg Oral Q breakfast  . feeding supplement  1 Container Oral TID WC  . insulin aspart  0-5 Units Subcutaneous QHS  . insulin aspart  0-9 Units Subcutaneous TID WC  . lisinopril  20 mg Oral Daily  . metFORMIN  850 mg Oral BID WC  . metroNIDAZOLE  500 mg Oral Q8H  . saccharomyces boulardii  500 mg Oral BID  . simvastatin  20 mg Oral q1800  . sodium chloride  500 mL Intravenous Once  . DISCONTD: carvedilol  12.5 mg Oral BID WC  . DISCONTD: metFORMIN  500 mg Oral BID WC   Continuous Infusions:  PRN Meds:.acetaminophen, ALPRAZolam, bisacodyl, cloNIDine, food thickener, sorbitol  Labs:  CMP     Component Value Date/Time   NA 135 03/21/2011 0613   K 4.9 03/21/2011 0613   CL 107 03/21/2011 0613   CO2 20 03/21/2011 0613   GLUCOSE 96 03/21/2011 0613   BUN 25* 03/21/2011 0613   CREATININE 0.94 03/21/2011 0613   CALCIUM 9.2 03/21/2011 0613   PROT 7.2 03/13/2011 0820   ALBUMIN 3.7 03/13/2011 0820   AST 25 03/13/2011 0820   ALT 21 03/13/2011 0820   ALKPHOS 88 03/13/2011 0820   BILITOT 0.3 03/13/2011 0820   GFRNONAA 52* 03/21/2011 0613   GFRAA 61* 03/21/2011 0613     Intake/Output Summary (Last 24 hours) at 03/22/11 1144 Last data filed at 03/22/11 0900  Gross per 24 hour  Intake    720 ml  Output      0 ml  Net    720 ml    Weight Status:  62.7 kg, wt stable  Estimated needs:  1350 - 1500 kcal, 60 - 75 grams protein  Nutrition Dx:  Inadequate oral intake r/t poor appetite AEB 15% wt loss PTA and family report. Resolved.  Goal:  Pt will meet >/= 90% of estimated needs. Met.  Intervention:  Continue current interventions. RD encouraged hydration, pt  declined additional oral nutrition supplements.  Monitor:  Weights, labs, PO intake  Adair Laundry Pager #:  [phone]

## 2011-03-22 NOTE — Progress Notes (Signed)
SLP Cancellation Note  Treatment cancelled today due to medical issues with patient which prohibited therapy (orothostatic hypotension).  Patient missed 30 minutes of skilled therapy.  Fae Pippin, M.A., CCC-SLP [phone]  [place] Monds 03/22/2011, 4:45 PM

## 2011-03-22 NOTE — Progress Notes (Addendum)
0945 Called to pt. Room by therapist.  Pt. BP reported as low.  Upon assessment @ 0950 BP reveals 100/58 Dinamap. LOC reveals pt. AOX3. Fluids Encouraged.  240 ML water intake.  1000 Recheck BP 118/58 Manual,  Pt. And family  encouraged to continue fluid intake.  Family advised to participate w/ fluids.   1015  Recheck BP 120/67 Manual.  Continuing to monitor.  Fluids encouraged.

## 2011-03-22 NOTE — Progress Notes (Signed)
Physical Therapy Session Note  Patient Details  Name: Christina Pittman MRN: [id number] Date of Birth: 1919/05/18  Today's Date: 03/22/2011 Time: 0903-1003 Time Calculation (min): 60 min  PT Short Term Goals Week 2:  PT Short Term Goal 1 (Week 2): Patient will consistently transfer with min assist. PT Short Term Goal 1 - Progress (Week 2): Progressing toward goal PT Short Term Goal 2 (Week 2): Patient will gait 100 feet with min assist and LRAD, controlled environment. PT Short Term Goal 2 - Progress (Week 2): Progressing toward goal PT Short Term Goal 3 (Week 2): Patient will gait 35 feet with min assist and LRAD, home environment. PT Short Term Goal 3 - Progress (Week 2): Progressing toward goal PT Short Term Goal 4 (Week 2): Patient will negotiate 4 steps with rails and min assist. PT Short Term Goal 4 - Progress (Week 2): Progressing toward goal PT Short Term Goal 5 (Week 2): Patient will perform car transfer with min assist. PT Short Term Goal 5 - Progress (Week 2): Progressing toward goal  Skilled Therapeutic Interventions/Progress Updates: Treatment focused on neuromuscular re-education RUE and RLE via forced use, manual cues, VCs. W/C >< mat with mod Assist for wt shifting, LE power to stand.  Therapeutic standing activity for RLE stance, folding towels with BUEs.  Upon standing, pt stating she was incontinent of urine.  Pt returned to room,  W/c> toilet transfer using wall bar with mod A.  Hygiene with close supervision.  Gait training in room with RW x 10' x 2, VCs for extending trunk and neck, min steady A.  Pt has very forward flexed posture in standing.  Standing during folding towels at table with min A for balance, tolerating less than 1 minute, x 2.  Stood at window to look outside x 1 minute.       Therapy Documentation Precautions:  Precautions Precautions: Fall Precaution Comments: right hemiplegia, decreased awareness of deficits with decreased safety awareness Required  Braces or Orthoses: Yes Restrictions Weight Bearing Restrictions: No     Pain: Pain Assessment Pain Assessment: No/denies pain Pain Score: 0-No pain     See FIM for current functional status  Therapy/Group: Individual Therapy  Christina Pittman 03/22/2011, 10:13 AM

## 2011-03-22 NOTE — Progress Notes (Signed)
Occupational Therapy Session Note  Patient Details  Name: Christina Pittman MRN: [id number] Date of Birth: 1919-04-15  Today's Date: 03/22/2011 Time: 4098-1191 Time Calculation (min): 31 min  Skilled Therapeutic Interventions/Progress Updates:    Pt began by working on toileting using 3:1.  Pt needs mod assist for toileting and mod assist for stand pivot transfer.  Overall reports not feeling well.  Washed her face at the sink and then therapist took her blood pressure.  BP sitting in chair was 73/50, 68/44 using dynomat and 70/54 with manual check.  Pt returned to supine position and nursing notified.  BP supine 100/68.  Pt missed 10 mins of OT session.  Therapy Documentation Precautions:  Precautions Precautions: Fall Precaution Comments: right hemiplegia, decreased awareness of deficits with decreased safety awareness Required Braces or Orthoses: Yes Restrictions Weight Bearing Restrictions: No General: General Amount of Missed OT Time (min): 10 Minutes (10 mins) Vital Signs: Therapy Vitals BP: 73/50 mmHg (BP also 68/40 and 70/54 in sitting.  100/68 supine ) Patient Position, if appropriate: Sitting Pain: Pain Assessment Pain Assessment: No/denies pain Pain Score: 0-No pain ADL: See FIM for current functional status  Therapy/Group: Individual Therapy  Teoman Giraud OTR/L 03/22/2011, 11:01 AM

## 2011-03-22 NOTE — Plan of Care (Signed)
Problem: RH SKIN INTEGRITY Goal: RH STG SKIN FREE OF INFECTION/BREAKDOWN Minimum assistance with skin care  Outcome: Not Progressing Contact dermatitis/ PA notified. Goal: RH STG MAINTAIN SKIN INTEGRITY WITH ASSISTANCE STG Maintain Skin Integrity With Minimum Assistance.  Outcome: Progressing hydrocordisone prescribed to affected areas  Comments:  Error: Chart on wrong patient.

## 2011-03-22 NOTE — Progress Notes (Signed)
Speech Language Pathology Daily Session Note  Patient Details  Name: Christina Pittman MRN: [id number] Date of Birth: March 13, 1919  Today's Date: 03/22/2011 Time: 4098-1191 Time Calculation (min): 30 min  Short Term Goals: Week 1: SLP Short Term Goal 1 (Week 1): Pt will demonstrate functional problem solving for familiar tasks with Mod A verbal cues. SLP Short Term Goal 2 (Week 1): Pt will demonstrate sustained attention to a functional task for ~10 mins with Mod A verbal cues for redirection SLP Short Term Goal 3 (Week 1): Pt will utilize external aids to increase recall of daily information with Min A verbal cues.  SLP Short Term Goal 4 (Week 1): Pt will utilize swallowing compensatory strategies with supervision verbal cues SLP Short Term Goal 5 (Week 1): pt will consume Dys. 2 textures and nectar-thick liquids without overt s/s of aspiration with supervision verbal cues.   Skilled Therapeutic Interventions: Patient demonstrated efficient mastication of regular textures and consumed thin liquids with cues to elevate head of bed with p.o. Intake and supervision semantic cues to monitor and compensate for right oral pocketing throughout meal. Family present and educated regarding how to cue her (son and daughter-in-law).  Daily Session Pain Pain Assessment Pain Assessment: No/denies pain Pain Score: 0-No pain  Therapy/Group: Individual Therapy  Charlane Ferretti., CCC-SLP [phone]  Luay Balding 03/22/2011, 4:35 PM

## 2011-03-23 LAB — BASIC METABOLIC PANEL
Calcium: 9.1 mg/dL (ref 8.4–10.5)
GFR calc Af Amer: 58 mL/min — ABNORMAL LOW (ref >90)
GFR calc non Af Amer: 50 mL/min — ABNORMAL LOW (ref >90)
Glucose, Bld: 197 mg/dL — ABNORMAL HIGH (ref 70–99)
Sodium: 136 mEq/L (ref 135–145)

## 2011-03-23 LAB — GLUCOSE, CAPILLARY: Glucose-Capillary: 167 mg/dL — ABNORMAL HIGH (ref 70–99)

## 2011-03-23 NOTE — Progress Notes (Signed)
Speech Pathology Note  Missed 45 min treatment session ( [employer]) due to pain/diarrhea.  Christina Pittman.Ed [employer] [phone]  03/23/2011

## 2011-03-23 NOTE — Progress Notes (Signed)
Physical Therapy Note  Patient Details  Name: Christina Pittman MRN: [id number] Date of Birth: February 28, 1919 Today's Date: 03/23/2011  Time: 1000-1100 60 minutes  No c/o pain.  Pt participated in gait group for short distance ambulation with RW with mod A, cues for increased step length. Side stepping with mod A.  Stair training with mod A.  Cues for sequencing and manual facilitation for wt shifts.  Group therapy   Lynae Pederson 03/23/2011, 11:08 AM

## 2011-03-23 NOTE — Progress Notes (Signed)
Patient ID: Christina Pittman, female   DOB: 02-20-1919, 78 y.o.   MRN: [id number] Subjective/Complaints: Patient is in the room with 3 family members. She denies any complaints. She feels well. She denies any pain. Her appetite is good. Review of Systems  Neurological: Positive for sensory change and focal weakness.  All other systems reviewed and are negative.   Objective: Vital Signs: Blood pressure 153/75, pulse 76, temperature 98.4 F (36.9 C), temperature source Oral, resp. rate 17, weight 138 lb 3.2 oz (62.687 kg), SpO2 95.00%. Dg Swallowing Func-no Report  03/21/2011  CLINICAL DATA: assess for silent aspiration   FLUOROSCOPY FOR SWALLOWING FUNCTION STUDY:  Fluoroscopy was provided for swallowing function study, which was  administered by a speech pathologist.  Final results and recommendations  from this study are contained within the speech pathology report.     No results found for this basename: WBC:2,HGB:2,HCT:2,PLT:2 in the last 72 hours  Basename 03/21/11 0613  NA 135  K 4.9  CL 107  CO2 20  GLUCOSE 96  BUN 25*  CREATININE 0.94  CALCIUM 9.2   CBG (last 3)   Basename 03/22/11 2129 03/22/11 1634 03/22/11 1128  GLUCAP 131* 211* 218*    Wt Readings from Last 3 Encounters:  03/20/11 138 lb 3.2 oz (62.687 kg)  03/13/11 136 lb 3.2 oz (61.78 kg)  10/28/10 141 lb (63.957 kg)    Physical Exam:  elderly female in no acute distress. HEENT exam atraumatic, normocephalic, extraocular muscles are intact. Neck is supple. No jugular venous distention no thyromegaly. Chest clear to auscultation without increased work of breathing. Cardiac exam S1 and S2 are regular. Abdominal exam active bowel sounds, soft, nontender. Extremities no edema. She is alert and oriented.   Assessment/Plan: 1. Functional deficits secondary to left cortical/subcortical infarcts which require 3+ hours per day of interdisciplinary therapy in a comprehensive inpatient rehab setting. Physiatrist is providing  close team supervision and 24 hour management of active medical problems listed below. Physiatrist and rehab team continue to assess barriers to discharge/monitor patient progress toward functional and medical goals. FIM: FIM - Bathing Bathing Steps Patient Completed: Chest;Right Arm;Abdomen;Right upper leg;Left upper leg;Right lower leg (including foot) Bathing: 0: Activity did not occur  FIM - Upper Body Dressing/Undressing Upper body dressing/undressing steps patient completed: Put head through opening of pull over shirt/dress;Pull shirt over trunk Upper body dressing/undressing: 3: Mod-Patient completed 50-74% of tasks FIM - Lower Body Dressing/Undressing Lower body dressing/undressing steps patient completed: Thread/unthread right pants leg;Thread/unthread left pants leg;Don/Doff right shoe;Don/Doff left shoe Lower body dressing/undressing: 0: Activity did not occur  FIM - Toileting Toileting steps completed by patient: Adjust clothing prior to toileting Toileting Assistive Devices: Grab bar or rail for support Toileting: 3: Mod-Patient completed 2 of 3 steps  FIM - Diplomatic Services operational officer Devices: Psychiatrist Transfers: 3-From toilet/BSC: Mod A (lift or lower assist) Toilet Transfers DO NOT USE: 0-Activity did not occur  FIM - Banker Devices: Bed rails;HOB elevated Bed/Chair Transfer: 3: Bed > Chair or W/C: Mod A (lift or lower assist);3: Chair or W/C > Bed: Mod A (lift or lower assist)  FIM - Locomotion: Wheelchair Locomotion: Wheelchair: 1: Total Assistance/staff pushes wheelchair (Pt<25%) FIM - Locomotion: Ambulation Locomotion: Ambulation Assistive Devices: Designer, industrial/product Ambulation/Gait Assistance: 4: Min assist Locomotion: Ambulation: 1: Travels less than 50 ft with minimal assistance (Pt.>75%)  Comprehension Comprehension Mode: Auditory Comprehension: 5-Follows basic conversation/direction:  With extra time/assistive device  Expression Expression Mode: Verbal Expression:  5-Expresses basic needs/ideas: With extra time/assistive device  Social Interaction Social Interaction: 5-Interacts appropriately 90% of the time - Needs monitoring or encouragement for participation or interaction.  Problem Solving Problem Solving: 3-Solves basic 50 - 74% of the time/requires cueing 25 - 49% of the time  Memory Memory: 4-Recognizes or recalls 75 - 89% of the time/requires cueing 10 - 24% of the time  1. DVT Prophylaxis/Anticoagulation: Pharmaceutical: Lovenox  2. Pain Management: use tylenol prn for LBP. Also has pain over the right first toe and metatarsals. Protect skin 3. Mood: showing some signs of depression 4. DM type 2: resumed metformin bid. cbg's showing improvement but still above goal , increase metformin check BMET in am CBG (last 3)   Basename 03/22/11 2129 03/22/11 1634 03/22/11 1128  GLUCAP 131* 211* 218*    Will continue current medications and follow blood sugars. 6. Leukocytosis: WBC  due to C Diff colitis  CBC:    Component Value Date/Time   WBC 12.8* 03/19/2011 0645   HGB 13.3 03/19/2011 0645   HCT 39.3 03/19/2011 0645   PLT 295 03/19/2011 0645   MCV 90.6 03/19/2011 0645   NEUTROABS 7.5 03/18/2011 0730   LYMPHSABS 3.1 03/18/2011 0730   MONOABS 1.3* 03/18/2011 0730   EOSABS 0.2 03/18/2011 0730   BASOSABS 0.0 03/18/2011 0730      7. HTN: Monitor with bid checks. Continue lisinopril (reduce to 20mg ) and increase coreg.monitor orthostasis 8. Dyslipidemia: Continue zocor daily.  9. wound care: Continue pressure relief and dressing as appropriate. These wounds are chronic.  10. FEN- note eating really well.  Appetite improving.  off megace 10-90% meals  -D/C IVF  -family and staff are pushing po fluids yesterday 11. Chronic toe ulcer- calcium alginate dressing 12.  C diff colitis flagyll started LOS (Days) 8 A FACE TO FACE EVALUATION WAS  PERFORMED  Maryah Marinaro HENRY 03/23/2011, 6:45 AM

## 2011-03-24 LAB — GLUCOSE, CAPILLARY
Glucose-Capillary: 104 mg/dL — ABNORMAL HIGH (ref 70–99)
Glucose-Capillary: 165 mg/dL — ABNORMAL HIGH (ref 70–99)
Glucose-Capillary: 232 mg/dL — ABNORMAL HIGH (ref 70–99)

## 2011-03-24 NOTE — Progress Notes (Signed)
Reviewed with patients son the rationale for wearing gowns and gloves and thorough handwashing when in patients room and removing prior to leaving to reduce contamination to others and self.   Elizebeth Koller, RN 03/24/2011 [phone]

## 2011-03-24 NOTE — Progress Notes (Signed)
Patient ID: Christina Pittman, female   DOB: Apr 22, 1919, 78 y.o.   MRN: [id number] Patient ID: Christina Pittman, female   DOB: 10-02-1919, 78 y.o.   MRN: [id number] Subjective/Complaints: Patient is in the room with 1 family members. She denies any complaints. She feels well. She denies any pain. Her appetite is good. Review of Systems  Neurological: Positive for sensory change and focal weakness.  All other systems reviewed and are negative.   Objective: blood pressure variable:73/50 110/70 153/75 111/70 163/89  Vital Signs: Blood pressure 163/89, pulse 82, temperature 98.1 F (36.7 C), temperature source Oral, resp. rate 18, weight 138 lb 3.2 oz (62.687 kg), SpO2 98.00%. No results found. No results found for this basename: WBC:2,HGB:2,HCT:2,PLT:2 in the last 72 hours  Basename 03/23/11 0900  NA 136  K 4.8  CL 106  CO2 20  GLUCOSE 197*  BUN 25*  CREATININE 0.98  CALCIUM 9.1   CBG (last 3)   Basename 03/23/11 2110 03/23/11 1637 03/23/11 1152  GLUCAP 167* 199* 138*    Wt Readings from Last 3 Encounters:  03/20/11 138 lb 3.2 oz (62.687 kg)  03/13/11 136 lb 3.2 oz (61.78 kg)  10/28/10 141 lb (63.957 kg)    Physical Exam:  elderly female in no acute distress. HEENT exam atraumatic, normocephalic, extraocular muscles are intact. Neck is supple. No jugular venous distention no thyromegaly. Chest clear to auscultation without increased work of breathing. Cardiac exam S1 and S2 are regular. Abdominal exam active bowel sounds, soft, nontender. Extremities no edema. She is alert and oriented.   Assessment/Plan: 1. Functional deficits secondary to left cortical/subcortical infarcts which require 3+ hours per day of interdisciplinary therapy in a comprehensive inpatient rehab setting. Physiatrist is providing close team supervision and 24 hour management of active medical problems listed below. Physiatrist and rehab team continue to assess barriers to discharge/monitor patient progress toward  functional and medical goals. FIM: FIM - Bathing Bathing Steps Patient Completed: Chest;Right Arm;Left Arm;Abdomen;Right upper leg;Left upper leg;Right lower leg (including foot) Bathing: 3: Mod-Patient completes 5-7 2f 10 parts or 50-74%  FIM - Upper Body Dressing/Undressing Upper body dressing/undressing steps patient completed: Put head through opening of pull over shirt/dress;Pull shirt over trunk Upper body dressing/undressing: 3: Mod-Patient completed 50-74% of tasks FIM - Lower Body Dressing/Undressing Lower body dressing/undressing steps patient completed: Thread/unthread right pants leg;Thread/unthread left pants leg;Don/Doff right shoe;Don/Doff left shoe Lower body dressing/undressing: 0: Activity did not occur  FIM - Toileting Toileting steps completed by patient: Adjust clothing prior to toileting Toileting Assistive Devices: Grab bar or rail for support Toileting: 3: Mod-Patient completed 2 of 3 steps  FIM - Diplomatic Services operational officer Devices: Psychiatrist Transfers: 3-From toilet/BSC: Mod A (lift or lower assist) Toilet Transfers DO NOT USE: 0-Activity did not occur  FIM - Banker Devices: Bed rails;HOB elevated Bed/Chair Transfer: 3: Bed > Chair or W/C: Mod A (lift or lower assist);3: Chair or W/C > Bed: Mod A (lift or lower assist)  FIM - Locomotion: Wheelchair Locomotion: Wheelchair: 1: Total Assistance/staff pushes wheelchair (Pt<25%) FIM - Locomotion: Ambulation Locomotion: Ambulation Assistive Devices: Designer, industrial/product Ambulation/Gait Assistance: 4: Min assist Locomotion: Ambulation: 1: Travels less than 50 ft with moderate assistance (Pt: 50 - 74%)  Comprehension Comprehension Mode: Auditory Comprehension: 6-Follows complex conversation/direction: With extra time/assistive device  Expression Expression Mode: Verbal Expression: 5-Expresses basic needs/ideas: With extra time/assistive  device  Social Interaction Social Interaction: 5-Interacts appropriately 90% of the time - Needs monitoring or  encouragement for participation or interaction.  Problem Solving Problem Solving: 3-Solves basic 50 - 74% of the time/requires cueing 25 - 49% of the time  Memory Memory: 4-Recognizes or recalls 75 - 89% of the time/requires cueing 10 - 24% of the time  1. DVT Prophylaxis/Anticoagulation: Pharmaceutical: Lovenox  2. Pain Management: use tylenol prn for LBP. Also has pain over the right first toe and metatarsals. Protect skin 3. Mood: showing some signs of depression 4. DM type 2: resumed metformin bid. cbg's showing improvement but still above goal , increase metformin check BMET in am CBG (last 3)   Basename 03/23/11 2110 03/23/11 1637 03/23/11 1152  GLUCAP 167* 199* 138*    Will continue current medications and follow blood sugars. 6. Leukocytosis: WBC  due to C Diff colitis  CBC:    Component Value Date/Time   WBC 12.8* 03/19/2011 0645   HGB 13.3 03/19/2011 0645   HCT 39.3 03/19/2011 0645   PLT 295 03/19/2011 0645   MCV 90.6 03/19/2011 0645   NEUTROABS 7.5 03/18/2011 0730   LYMPHSABS 3.1 03/18/2011 0730   MONOABS 1.3* 03/18/2011 0730   EOSABS 0.2 03/18/2011 0730   BASOSABS 0.0 03/18/2011 0730      7. HTN: Monitor with bid checks. No terrible blood pressures. She does feel somewhat weak in the mornings. Given her blood pressure variability I think it's best to continue current medications and follow. 8. Dyslipidemia: Continue zocor daily.  9. wound care: Continue pressure relief and dressing as appropriate. These wounds are chronic.  10. FEN- note eating really well.  Appetite improving.  off megace 10-90% meals  -D/C IVF  -family and staff are pushing po fluids yesterday 11. Chronic toe ulcer- calcium alginate dressing 12.  C diff colitis flagyll started LOS (Days) 9 A FACE TO FACE EVALUATION WAS PERFORMED  Christina Pittman 03/24/2011, 7:27 AM

## 2011-03-24 NOTE — Progress Notes (Signed)
OccupationalTherapy Note  Patient Details  Name: Christina Pittman MRN: [id number] Date of Birth: Dec 04, 1919 Today's Date: 03/24/2011  Time:1315-1400 Group Therapy Pt denies pain  Pt participated in neuro upper extremity group with focus on bilateral activities, upper extremity assisted self-range of motion, shoulder strengthening and patient ed on signs and symptoms of shoulder impingement.  Patient arrived 15 minutes late due to prolonged lunch meal but engaged in discussion and activities enthusiastically after her arrival.  Patient demo's ability to actively move shoulder/elbow [facility] for flexion/extension but has only trace MS at wrist and fingers.   Patient participated well and required minimal supervision to proceed through exercises.  Georgeanne Nim 03/24/2011, 5:40 PM

## 2011-03-25 LAB — BASIC METABOLIC PANEL
BUN: 33 mg/dL — ABNORMAL HIGH (ref 6–23)
Chloride: 108 mEq/L (ref 96–112)
Creatinine, Ser: 1.21 mg/dL — ABNORMAL HIGH (ref 0.50–1.10)
GFR calc non Af Amer: 38 mL/min — ABNORMAL LOW (ref >90)
Glucose, Bld: 229 mg/dL — ABNORMAL HIGH (ref 70–99)
Potassium: 4.9 mEq/L (ref 3.5–5.1)

## 2011-03-25 LAB — GLUCOSE, CAPILLARY
Glucose-Capillary: 196 mg/dL — ABNORMAL HIGH (ref 70–99)
Glucose-Capillary: 182 mg/dL — ABNORMAL HIGH (ref 70–99)

## 2011-03-25 MED ORDER — METFORMIN HCL 500 MG PO TABS
1000.0000 mg | ORAL_TABLET | Freq: Two times a day (BID) | ORAL | Status: DC
  Administered 2011-03-25 (×2): 1000 mg via ORAL
  Filled 2011-03-25 (×5): qty 2

## 2011-03-25 NOTE — Progress Notes (Signed)
Speech Pathology: Dysphagia Treatment Note  5366-4403 Group Session   Patient was observed with : regular textures and Thin liquids.  Patient was noted to have s/s of aspiration : No  Lung Sounds:  WNL Temperature: WNL  Patient required: no cues to consistently follow precautions/strategies  Clinical Impression: Patient was modified independent with safely feeding self after tray set up.   Recommendations:  Discharge SLP  Pain:   none Intervention Required:   No  Goals: Goals Partially Met  Fae Pippin, M.A., CCC-SLP [phone]

## 2011-03-25 NOTE — Plan of Care (Signed)
Problem: RH Dressing Goal: LTG Patient will perform upper body dressing (OT) LTG Patient will perform upper body dressing with assist, with/without cues (OT).  Outcome: Not Met (add Reason) Needs mod assist. Goal: LTG Patient will perform lower body dressing w/assist (OT) LTG: Patient will perform lower body dressing with assist, with/without cues in positioning using equipment (OT)  Outcome: Not Met (add Reason) Pt needs mod assist.  Problem: RH Toileting Goal: LTG Patient will perform toileting w/assist, cues/equip (OT) LTG: Patient will perform toiletiing (clothes management/hygiene) with assist, with/without cues using equipment (OT)  Outcome: Not Met (add Reason) Requires mod assist for managing clothing.

## 2011-03-25 NOTE — Progress Notes (Signed)
Occupational Therapy Session Note  Patient Details  Name: Christina Pittman MRN: [id number] Date of Birth: 07/01/1919  Today's Date: 03/25/2011 Time: 1478-2956 Time Calculation (min): 53 min  Short Term Goals: Week 2:  OT Short Term Goal 1 (Week 2): Pt will perform min assist for bathing 2/3 sessions. OT Short Term Goal 2 (Week 2): Pt will perform UB dressing with min assist for pullover. OT Short Term Goal 3 (Week 2): Pt will perform LB dressing with min assist 2/3 sessions. OT Short Term Goal 4 (Week 2): Pt will utilize the RUE as an active assist for bathing with min assist.  Skilled Therapeutic Interventions/Progress Updates:    Worked on selfcare retraining at the sink sit to stand.  Pt's daughter-in-law present for session.  Requires min assist for sit to stand with mod assist to position the right UE on the armrest for use.  Needs mod assist for bathing with the RUE.  Demonstrates good shoulder and elbow activation but only minimal wrist or finger movement.  Needs assist with holding washcloth to wash the LUE.  Min instructional cues for hemi-dressing techniques.  Educated the daughter-in-law on safe assist with toilet transfers encouraging her to use a gait belt for safety and use of rolling walker.  Feel pt will also benefit form 3:1 over the toilet or at bedside for home.  Therapy Documentation Precautions:  Precautions Precautions: Fall Precaution Comments: right hemiplegia, decreased awareness of deficits with decreased safety awareness Required Braces or Orthoses: Yes Restrictions Weight Bearing Restrictions: No  Pain: Pain Assessment Pain Assessment: No/denies pain ADL: See FIM for current functional status  Therapy/Group: Individual Therapy  Quintara Bost OTR/L 03/25/2011, 3:51 PM

## 2011-03-25 NOTE — Progress Notes (Signed)
Social Work Patient ID: Christina Pittman, female   DOB: April 26, 1919, 78 y.o.   MRN: [id number]  Met with pt and spoke with daughter via telephone to discuss discharge needs.  Agreeable to tub bench and wheelchair for community Use.  No pref.  Will make referral to [facility].  Daughter reports Dr. Katherine Mantle is pt's primary MD.  Will make follow up appt With her for pt.  Reached goals and is ready for discharge tomorrow.  Family aware pt will require 24 hour care and is prepared to provide this.

## 2011-03-25 NOTE — Progress Notes (Signed)
Physical Therapy Note  Patient Details  Name: Jem Castro MRN: [id number] Date of Birth: [birth year] Today's Date: 03/25/2011  4034-7425 (55 minutes) individual Pain - no complaint of pain Focus of treatment: Review all functional mobility/ gait prior to DC tomorrow Treatment: Sit to stand -mod assist to min assist with posterior lean and vcs for hand placement; sit to supine (mat) SBA; rolling left or right SBA; supine to sit SBA; transfers min assist RW with tactile cues for RT hand placement (pt reports hand splint painful); stand to sit min assist; gait 100 feet min assist with occ. vcs for safe use of AD; up/down 4 steps with one rail LT min assist (decreased eccentric knee control bilaterally) with vcs for sequencing.    Birda Didonato,JIM 03/25/2011, 9:51 AM

## 2011-03-25 NOTE — Progress Notes (Signed)
Speech Language Pathology Daily Session Note  Patient Details  Name: Christina Pittman MRN: [id number] Date of Birth: 11/03/1919  Today's Date: 03/25/2011 Time: 1330-1350 Time Calculation (min): 20 min  Short Term Goals: Week 1: SLP Short Term Goal 1 (Week 1): Pt will demonstrate functional problem solving for familiar tasks with Mod A verbal cues. SLP Short Term Goal 2 (Week 1): Pt will demonstrate sustained attention to a functional task for ~10 mins with Mod A verbal cues for redirection SLP Short Term Goal 3 (Week 1): Pt will utilize external aids to increase recall of daily information with Min A verbal cues.  SLP Short Term Goal 4 (Week 1): Pt will utilize swallowing compensatory strategies with supervision verbal cues SLP Short Term Goal 5 (Week 1): pt will consume Dys. 2 textures and nectar-thick liquids without overt s/s of aspiration with supervision verbal cues.   Skilled Therapeutic Interventions: Session focused on patient and family education regarding cognition and dysphagia.  Patient consumed 6oz of water with fast consecutive sips with no overt s/s of aspiration.  Patient and family agree that patient has returned to baseline, if not better with attention, vocal intensity and memory.  Patient does occassionally requires verbal cues to check right cheek for pocketing due to new sensory deficits however family is able to provide necessary level of care upon discharge and agree no further follow up is necessary.   Daily Session Precautions/Restrictions  Precautions Precautions: Fall FIM:  Comprehension Comprehension Mode: Auditory Comprehension: 6-Follows complex conversation/direction: With extra time/assistive device Expression Expression Mode: Verbal Expression: 5-Expresses complex 90% of the time/cues < 10% of the time Social Interaction Social Interaction: 6-Interacts appropriately with others with medication or extra time (anti-anxiety, antidepressant). Problem  Solving Problem Solving: 4-Solves basic 75 - 89% of the time/requires cueing 10 - 24% of the time Memory Memory: 4-Recognizes or recalls 75 - 89% of the time/requires cueing 10 - 24% of the time FIM - Eating Eating Activity: 5: Set-up assist for open containers General    Pain Pain Assessment Pain Assessment: No/denies pain Pain Score: 0-No pain  Therapy/Group: Individual Therapy   Speech Language Pathology Discharge Summary  Patient Details  Name: Christina Pittman MRN: [id number] Date of Birth: [birth year] Today's Date: 03/25/2011  Patient has met 7 of 7 long term goals due to gains in swallow function and cognition.  Patient to discharge at overall Supervision level.  Patient's care partner is independent to provide the necessary cognitive and dysphagia assistance at discharge (occassional reminders to monitor right oral pocketing due to sensory deficits).  Reasons goals not met: n/a  Recommendation:  Patient requires no SLP follow up post CIR discharge.  Equipment: none  Reasons for discharge: treatment goals met and discharge from hospital  Patient/family agrees with progress made and goals achieved: Yes  See FIM for current functional status  Charlane Ferretti., CCC-SLP [phone]  Danai Gotto 03/25/2011, 4:04 PM

## 2011-03-25 NOTE — Progress Notes (Signed)
Patient ID: Christina Pittman, female   DOB: September 15, 1919, 78 y.o.   MRN: [id number] Subjective/Complaints:  Fwwl ok, no pain.  Drinking Coke Review of Systems  Neurological: Positive for sensory change and focal weakness.  All other systems reviewed and are negative.   Objective: Vital Signs: Blood pressure 127/78, pulse 74, temperature 99.1 F (37.3 C), temperature source Oral, resp. rate 18, weight 62.687 kg (138 lb 3.2 oz), SpO2 98.00%. No results found. No results found for this basename: WBC:2,HGB:2,HCT:2,PLT:2 in the last 72 hours  Basename 03/23/11 0900  NA 136  K 4.8  CL 106  CO2 20  GLUCOSE 197*  BUN 25*  CREATININE 0.98  CALCIUM 9.1   CBG (last 3)   Basename 03/25/11 0716 03/24/11 2047 03/24/11 1650  GLUCAP 196* 232* 167*    Wt Readings from Last 3 Encounters:  03/20/11 62.687 kg (138 lb 3.2 oz)  03/13/11 61.78 kg (136 lb 3.2 oz)  10/28/10 63.957 kg (141 lb)    Physical Exam:  General appearance: alert, cooperative and no distress Head: Normocephalic, without obvious abnormality, atraumatic Eyes: EOMI and visual fields intact Nose: Nares normal. Septum midline. Mucosa normal. No drainage or sinus tenderness. Throat: lips, mucosa, and tongue normal; teeth and gums normal Neck: no adenopathy, no carotid bruit, no JVD, supple, symmetrical, trachea midline and thyroid not enlarged, symmetric, no tenderness/mass/nodules Back: symmetric, no curvature. ROM normal. No CVA tenderness. Resp: clear to auscultation bilaterally Cardio: regular rate and rhythm, S1, S2 normal, no murmur, click, rub or gallop and normal apical impulse GI: soft, non-tender; bowel sounds normal; no masses,  no organomegaly Extremities: extremities normal, atraumatic, no cyanosis or edema Pulses: 2+ and symmetric Skin: Skin color, texture, turgor normal. No rashes or lesions Neurologic: very alert.  Speech clearer today. RUE3/5 1/5 R grip RLE 3+/5. Sensory 1/2 on right.  Mild right 7 .  cognitvely  very alert. Incision/Wound: chronic skin changes and chronic wounds on right foot   Assessment/Plan: 1. Functional deficits secondary to left cortical/subcortical infarcts which require 3+ hours per day of interdisciplinary therapy in a comprehensive inpatient rehab setting. Physiatrist is providing close team supervision and 24 hour management of active medical problems listed below. Physiatrist and rehab team continue to assess barriers to discharge/monitor patient progress toward functional and medical goals. FIM: FIM - Bathing Bathing Steps Patient Completed: Chest;Right Arm;Left Arm;Abdomen;Right upper leg;Left upper leg;Right lower leg (including foot) Bathing: 3: Mod-Patient completes 5-7 63f 10 parts or 50-74%  FIM - Upper Body Dressing/Undressing Upper body dressing/undressing steps patient completed: Put head through opening of pull over shirt/dress;Pull shirt over trunk Upper body dressing/undressing: 0: Wears gown/pajamas-no public clothing FIM - Lower Body Dressing/Undressing Lower body dressing/undressing steps patient completed: Thread/unthread right pants leg;Thread/unthread left pants leg;Don/Doff right shoe;Don/Doff left shoe Lower body dressing/undressing: 0: Wears gown/pajamas-no public clothing  FIM - Toileting Toileting steps completed by patient: Performs perineal hygiene Toileting Assistive Devices: Grab bar or rail for support Toileting: 3: Mod-Patient completed 2 of 3 steps  FIM - Diplomatic Services operational officer Devices: Grab bars Toilet Transfers: 3-From toilet/BSC: Mod A (lift or lower assist) Toilet Transfers DO NOT USE: 0-Activity did not occur  FIM - Banker Devices: Therapist, occupational: 3: Bed > Chair or W/C: Mod A (lift or lower assist);3: Chair or W/C > Bed: Mod A (lift or lower assist)  FIM - Locomotion: Wheelchair Locomotion: Wheelchair: 1: Total Assistance/staff pushes wheelchair  (Pt<25%) FIM - Locomotion: Ambulation Locomotion: Ambulation Assistive Devices:  Walker - Rolling Ambulation/Gait Assistance: 4: Min assist Locomotion: Ambulation: 1: Travels less than 50 ft with moderate assistance (Pt: 50 - 74%)  Comprehension Comprehension Mode: Auditory Comprehension: 6-Follows complex conversation/direction: With extra time/assistive device  Expression Expression Mode: Verbal Expression: 5-Expresses basic needs/ideas: With extra time/assistive device  Social Interaction Social Interaction: 5-Interacts appropriately 90% of the time - Needs monitoring or encouragement for participation or interaction.  Problem Solving Problem Solving: 3-Solves basic 50 - 74% of the time/requires cueing 25 - 49% of the time  Memory Memory: 4-Recognizes or recalls 75 - 89% of the time/requires cueing 10 - 24% of the time  1. DVT Prophylaxis/Anticoagulation: Pharmaceutical: Lovenox  2. Pain Management: use tylenol prn for LBP. Also has pain over the right first toe and metatarsals. Protect skin 3. Mood: showing some signs of depression 4. DM type 2: resumed metformin bid. cbg's showing improvement but still above goal , increase metformin check BMET in am, discussed my rec for no soda containing sugar 6. Leukocytosis: WBC  due to C Diff colitis 7. HTN: Monitor with bid checks. Continue lisinopril (reduce to 20mg ) and increase coreg.monitor orthostasis 8. Dyslipidemia: Continue zocor daily.  9. wound care: Continue pressure relief and dressing as appropriate. These wounds are chronic.  10. FEN- note eating really well.  Appetite improving.  off megace 10-90% meals  BMET inam  -family and staff are pushing po fluids yesterday 11. Chronic toe ulcer- calcium alginate dressing 12.  C diff colitis flagyll started 2/18 cont through 3/4 LOS (Days) 10 A FACE TO FACE EVALUATION WAS PERFORMED  Tien Spooner E 03/25/2011, 7:54 AM

## 2011-03-25 NOTE — Progress Notes (Signed)
Occupational Therapy Session Note  Patient Details  Name: Celia Friedland MRN: [id number] Date of Birth: 1919/06/26  Today's Date: 03/25/2011 Time: 7829-5621 Time Calculation (min): 33 min  Skilled Therapeutic Interventions/Progress Updates:    Worked on RUE strengthening and coordination in standing.  Focus on shoulder flexion and elbow extension using slanted board.  Transitioned to using flat surface and having pt wash it using a dry washcloth.  Emphasis on forward shoulder flexion as well as some horizontal abduction. Pt able to move the UE on her own in all planes with minimal compensation.  When attempting to lift the UE off of the table and reach in open chain she needs min facilitation at the shoulder and mod facilitation at the hand.  Pt able to tolerate standing for 10 mins before needing rest break.   Therapy Documentation Precautions:  Precautions Precautions: Fall Precaution Comments: right hemiplegia, decreased awareness of deficits with decreased safety awareness Required Braces or Orthoses: Yes Restrictions Weight Bearing Restrictions: No  Pain: Pain Assessment Pain Assessment: No/denies pain Pain Score: 0-No pain ADL: See FIM for current functional status  Therapy/Group: Individual Therapy  Jaslynne Dahan OTR/L 03/25/2011, 4:06 PM

## 2011-03-25 NOTE — Progress Notes (Signed)
Occupational Therapy Discharge Summary  Patient Details  Name: Christina Pittman MRN: [id number] Date of Birth: 07/25/1919 Today's Date: 03/25/2011  Patient has met 9 of 12 long term goals due to improved activity tolerance, improved balance, postural control, functional use of  RIGHT upper extremity, improved attention and improved coordination.  Patient to discharge at [facility] Assist level.  Patient's care partner is independent to provide the necessary physical assistance at discharge.    Reasons goals not met: Pt still needs mod assist for dressing and toileting tasks  Recommendation:  Patient will benefit from ongoing skilled OT services in home health setting to continue to advance functional skills in the area of BADL.  Feel pt needs extensive continued neuro re-ed to increase the functional use of the RUE and RLE as well as increasing balance in order to increase overall ADL function.  Equipment: 3:1 and tub bench  Reasons for discharge: treatment goals met and discharge from hospital  Patient/family agrees with progress made and goals achieved: Yes  OT Discharge Precautions/Restrictions  Precautions Precautions: Fall Precaution Comments: right hemiparesis Required Braces or Orthoses: No Restrictions Weight Bearing Restrictions: No  Pain Pain Assessment Pain Assessment: No/denies pain Pain Score: 0-No pain ADL ADL Eating: Supervision/safety;Moderate cueing Where Assessed-Eating: Wheelchair Grooming: Supervision/safety;Moderate cueing Where Assessed-Grooming: Sitting at sink;Wheelchair Upper Body Bathing: Minimal assistance;Minimal cueing Where Assessed-Upper Body Bathing: Sitting at sink;Wheelchair Lower Body Bathing: Minimal assistance;Minimal cueing Where Assessed-Lower Body Bathing: Wheelchair;Standing at sink;Sitting at sink Upper Body Dressing: Moderate cueing;Moderate assistance Where Assessed-Upper Body Dressing: Sitting at sink;Wheelchair Lower Body Dressing:  Moderate assistance;Moderate cueing Where Assessed-Lower Body Dressing: Wheelchair;Sitting at sink;Standing at sink Toileting: Minimal cueing;Moderate assistance Where Assessed-Toileting: Bedside Commode Toilet Transfer: Minimal assistance;Minimal verbal cueing Toilet Transfer Method: Ambulating Toilet Transfer Equipment: Raised toilet seat;Grab bars Tub/Shower Transfer: Minimal assistance;Moderate cueing Tub/Shower Transfer Method: Ambulating Tub/Shower Equipment: Insurance underwriter: Not assessed Vision/Perception  Vision - History Baseline Vision: No visual deficits Patient Visual Report: No change from baseline Vision - Assessment Eye Alignment: Within Functional Limits  Cognition Attention: Sustained Sustained Attention: Appears intact Memory: Impaired Decreased Short Term Memory: Functional basic;Verbal basic Awareness Impairment: Anticipatory impairment Problem Solving: Impaired Problem Solving Impairment: Functional basic Safety/Judgment: Impaired Comments: Pt with decreased overall awareness of limitations and deficits.  Does not acknowledge that she needs assistance with transfers and selfcare.  When asked pt will reply that she is OK.  Able to maintain sustained attention during bathing task. Can easily be distracted when outside of the room.   Sensation Sensation Light Touch: Appears Intact (In the RUE) Proprioception: Appears Intact (Intact in the RUE) Coordination Gross Motor Movements are Fluid and Coordinated: No Fine Motor Movements are Fluid and Coordinated: No Coordination and Movement Description: See UE section for detail. Motor  Motor Motor: Hemiplegia Motor - Discharge Observations: Pt with moderate right hemiparesis in the RUE and RLE.  Isolate movement is better in the LE compared to the UE. Mobility  Bed Mobility Bed Mobility: No Transfers Transfers: Yes Sit to Stand: 4: Min assist;With upper extremity assist;From  toilet;With armrests;From chair/3-in-1 Sit to Stand Details: Tactile cues for placement Sit to Stand Details (indicate cue type and reason): Pt needs mod assist to position the RUE for helping with the sit to stand.  Trunk/Postural Assessment  Cervical Assessment Cervical Assessment: Within Functional Limits Thoracic Assessment Thoracic Assessment: Exceptions to [facility] Thoracic AROM Overall Thoracic AROM Comments: Pt with thoracic kyphosis in sitting and standing. Lumbar Assessment Lumbar Assessment: Exceptions to [facility]  Lumbar AROM Overall Lumbar AROM Comments: Pt with decreased ability to maintain lumbar extension in sitting or standing.  Maintains a posterior pelvic tile in sitting.  Balance Balance Balance Assessed: Yes Static Sitting Balance Static Sitting - Balance Support: Left upper extremity supported Static Sitting - Level of Assistance: 6: Modified independent (Device/Increase time) Dynamic Sitting Balance Dynamic Sitting - Balance Support: Left upper extremity supported Dynamic Sitting - Level of Assistance: 5: Stand by assistance Static Standing Balance Static Standing - Level of Assistance: 4: Min assist Dynamic Standing Balance Dynamic Standing - Level of Assistance: 4: Min assist Extremity/Trunk Assessment RUE Assessment RUE Assessment: Exceptions to [facility] RUE AROM (degrees) RUE Overall AROM Comments: 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  PROM WFLs for all joints of the UE throughtout.  Pt does have a history of arthritis noted in the hand.  Can exhibit some shoulder flexion in a synergy pattern to above shoulder level.  Demonstrates only slight wrist extension, finger flexion and extension. LUE Assessment LUE Assessment: Within Functional Limits  See FIM for current functional status  Bhavya Eschete OTR/L 03/25/2011, 4:29 PM

## 2011-03-25 NOTE — Progress Notes (Signed)
Occupational Therapy Session Note  Patient Details  Name: Christina Pittman MRN: [id number] Date of Birth: 1919-12-29  Today's Date: 03/25/2011 Time: 1130-1200 Time Calculation (min): 30 min  Short Term Goals: Week 2:  OT Short Term Goal 1 (Week 2): Pt will perform min assist for bathing 2/3 sessions. OT Short Term Goal 2 (Week 2): Pt will perform UB dressing with min assist for pullover. OT Short Term Goal 3 (Week 2): Pt will perform LB dressing with min assist 2/3 sessions. OT Short Term Goal 4 (Week 2): Pt will utilize the RUE as an active assist for bathing with min assist.  Skilled Therapeutic Interventions/Progress Updates:    [employer] Session with focus on using the RUE as a stabilizer for holding her food container while using the LUE to self feed.  Pt still needs mod assist to hold secondary to limited finger AROM.  Therapy Documentation Precautions:  Precautions Precautions: Fall Precaution Comments: right hemiplegia, decreased awareness of deficits with decreased safety awareness Required Braces or Orthoses: Yes Restrictions Weight Bearing Restrictions: No   Pain: Pain Assessment Pain Assessment: No/denies pain ADL: ADL Eating: Supervision/safety Where Assessed-Eating: Wheelchair Grooming: Minimal assistance Where Assessed-Grooming: Sitting at sink;Wheelchair Upper Body Bathing: Minimal assistance Where Assessed-Upper Body Bathing: Sitting at sink;Wheelchair Lower Body Bathing: Moderate assistance Where Assessed-Lower Body Bathing: Standing at sink;Sitting at sink;Wheelchair Upper Body Dressing: Moderate assistance;Moderate cueing Where Assessed-Upper Body Dressing: Wheelchair;Sitting at sink Lower Body Dressing: Moderate cueing;Moderate assistance Where Assessed-Lower Body Dressing: Standing at sink;Sitting at sink;Wheelchair Toileting: Moderate assistance Where Assessed-Toileting: Bedside Commode Toilet Transfer: Minimal assistance Toilet Transfer Method:  Ambulating (Using RW with hand splint on the right side.) Toilet Transfer Equipment: Grab bars Tub/Shower Transfer: Not assessed Film/video editor: Not assessed  See FIM for current functional status  Therapy/Group: Individual Therapy  Avelino Herren OTR/L 03/25/2011, 4:01 PM

## 2011-03-26 DIAGNOSIS — I6340 Cerebral infarction due to embolism of unspecified cerebral artery: Secondary | ICD-10-CM

## 2011-03-26 DIAGNOSIS — Z5189 Encounter for other specified aftercare: Secondary | ICD-10-CM

## 2011-03-26 DIAGNOSIS — G8110 Spastic hemiplegia affecting unspecified side: Secondary | ICD-10-CM

## 2011-03-26 LAB — GLUCOSE, CAPILLARY: Glucose-Capillary: 162 mg/dL — ABNORMAL HIGH (ref 70–99)

## 2011-03-26 MED ORDER — CLOPIDOGREL BISULFATE 75 MG PO TABS: 75.0000 mg | ORAL_TABLET | Freq: Every day | ORAL | Status: DC

## 2011-03-26 MED ORDER — SIMVASTATIN 20 MG PO TABS: 20.0000 mg | ORAL_TABLET | Freq: Every day | ORAL | Status: DC

## 2011-03-26 MED ORDER — GLIMEPIRIDE 2 MG PO TABS: 2.0000 mg | ORAL_TABLET | Freq: Every day | ORAL | Status: DC

## 2011-03-26 MED ORDER — GLIMEPIRIDE 2 MG PO TABS
2.0000 mg | ORAL_TABLET | Freq: Every day | ORAL | Status: DC
  Administered 2011-03-26: 2 mg via ORAL
  Filled 2011-03-26 (×2): qty 1

## 2011-03-26 MED ORDER — CARVEDILOL 25 MG PO TABS: 25.0000 mg | ORAL_TABLET | Freq: Two times a day (BID) | ORAL | Status: DC

## 2011-03-26 MED ORDER — METRONIDAZOLE 500 MG PO TABS: 500.0000 mg | ORAL_TABLET | Freq: Three times a day (TID) | ORAL | Status: AC

## 2011-03-26 MED ORDER — SACCHAROMYCES BOULARDII 250 MG PO CAPS: 500.0000 mg | ORAL_CAPSULE | Freq: Two times a day (BID) | ORAL | Status: AC

## 2011-03-26 NOTE — Discharge Summary (Signed)
Christina Pittman, Christina Pittman NO.:  [id number]  MEDICAL RECORD NO.:  [id number]  LOCATION:  4038                         FACILITY:  [facility]  PHYSICIAN:  Erick Colace, M.D.DATE OF BIRTH:  1919/11/10  DATE OF ADMISSION:  03/15/2011 DATE OF DISCHARGE:  03/26/2011                              DISCHARGE SUMMARY   DISCHARGE DIAGNOSES: 1. Left cortical subcortical infarct. 2. Incidental note of 2 cm pituitary mass.  Follow up on outpatient     basis. 3. Acute renal insufficiency. 4. Clostridium difficile colitis. 5. Diabetes mellitus type 2. 6. Dyslipidemia. 7. Peripheral vascular disease with chronic toe wound.  HISTORY OF PRESENT ILLNESS:  Christina Pittman is an 78 year old right handed female admitted February 13 with right-sided weakness and slurred speech.  MRI of brain done showed acute nonhemorrhagic scattered infarcts involving portions of left subinsular region, left external capsule, posterior left corona radiata, and left parietal lobe.  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  Carotid Dopplers done showed no significant ICA stenosis.  A 2D echo done revealed moderate LVH with EF of 65-70% and moderate to severe aortic stenosis.  Neurology was consulted for input and recommended Plavix for CVA prophylaxis as well as aggressive risk factor control.  It was felt that infarct is most likely due to embolic source, however, the patient not a Coumadin candidate due to fall risk.  No current mention of plan for pituitary tumor identified during MRI.  Speech therapy followup was done for dysphagia and the patient was placed on dysphagia II diet with nectar thick liquids.  The patient has had issues with swallow difficulty for the past 6 months with weight loss and speech therapy feels patient likely  with sensory and/or motor dysphagia.  The patient currently is noted to have cognitive deficits with decreased awareness of deficits with perseveration.  Requires moderate verbal cues for ADL tasks and to attend to right upper extremity.  The patient was evaluated by rehab and we felt that she would benefit from a CIR program.  LABS:  Most recent labs of February 25 revealed sodium 137, potassium 4.9, chloride 108, CO2 of 20 BUN 33, creatinine 1.21, glucose 229.  CBC of last of February 18 reveals hemoglobin 13.3, hematocrit 39.3, white count 12.8, platelets 295,000.  C. diff by PCR of 2/18 was positive. Urine culture of February 15 showed no growth.  FUNCTIONAL HISTORY:  The patient was independent with basic ADLs, home making, ambulation.  She was driving until 2 weeks prior to admission.  FUNCTIONAL STATUS:  The patient is mod assist for bed mobility, max assist for sit to stand, mod assist for ambulating 20 feet with verbal cues for technique and sequencing.  She requires max assist for eating, max assist for grooming, max assist for lower body dressing.  She reveals lack of awareness of right upper extremity placement with decreased sensation.  The patient's cognitive status is impaired.  She is oriented to person, able to follow 1-step commands inconsistently with increased time needed.  Decreased awareness of deficits, requires assistance for problem solving.  PHYSICAL EXAMINATION:  VITALS:  Blood pressure 129/95, pulse 96, temperature 97.1, respiratory rate 20.  GENERAL:  The patient is well nourished, well developed, no acute distress.  HEENT:  Atraumatic, normocephalic.  Oral mucosa is clear and moist.  The patient is edentulous.  Pupils equal, round, reactive to light. NECK:  Shows normal range of motion. CARDIOVASCULAR:  Normal rate, regular rhythm.  Systolic murmur heard. PULMONARY:  Normal effort.  Breath sounds normal. ABDOMEN:  Soft, nontender with positive bowel  sounds. MUSCULOSKELETAL:  No peripheral edema noted. NEUROLOGIC:  The patient is alert and oriented to person, place, and time.  Moderately dysarthric speech with poor phonation, follows basic commands without difficulty.  Able to state current date as well as date of birth with minimal cues.  Right upper extremity inattention noted. Right upper extremity is 1 to 2/5 proximal and trace and absent distally.  Right lower extremity is grossly 2+ to 3/5 proximal to distal.  Mild sensory loss.  Can still discriminate between pain and light touch on right central 7 with tongue deviation. SKIN:  Bilateral skin with stasis changes, right great toe and 2nd to 3rd interdigital area with dry scabs of prior ulcers, healing also were medial portion of right great toe.  Multiple areas of chronic skin changes in all 4 limbs and sun-related changes on face as well.  HOSPITAL COURSE:  Christina Pittman was admitted to rehab on March 15, 2011 for inpatient therapies to consist of PT, OT, and speech therapy at least 3 hours 5 days a week.  Past admission physiatrist, rehab RN, and therapy team have worked together to provide customized collaborative interdisciplinary care.  Rehab RN has worked with the patient on bowel and bladder program as well as safety issues.  They have also monitored the patient's p.o. intake and offered nutritional supplements as needed.  The patient's blood pressures have been checked on b.i.d. basis.  At time of discharge, these are ranging from 120s-130s systolic, 60s-70s diastolic.  Heart rate stable in 70s-80s range.  The patient is continent of bowel and bladder.  The patient was maintained on Plavix for CVA prophylaxis throughout her stay.  The patient's blood sugars were checked on before meals and at bedtime basis.  Amaryl was added for tighter blood sugar control.  Routine labs were done during this stay. Initially BUN creatinine were noted to be normal at 14 and 0.90.   Over the weekend of February 16 and February 17, the patient was noted to have issues with diarrhea as well as anorexia with poor p.o. intake. She had a presyncopal episode on February 18 and renal status checked was noted to be worsened with BUN at 35.  She was treated with IV fluids with improvement in her renal status.  Family was advised to push p.o. fluids.  Also stool was sent for check of C. diff and as this was positive, she was started on Flagyl for treatment.  She is to continue on Flagyl for 2 total weeks of treatment.  Routine check of lytes have been done during this stay due to the patient being on thickened liquids.  She has been advanced to thin liquids, however, labs done prior to discharge on February 25 show worsening of renal status with BUN at 33, creatinine at 1.21.  The patient and family have been advised to push fluids.  Her lisinopril was discontinued as we were concerned that this could be  the cause of her renal insufficiency.  Additionally metformin was discontinued. Blood sugars at time of discharge ranging from 130s to an occasional high in 230s. The patient's family is advised to check blood sugars at least on b.i.d. basis for now.  She is advised to drink at least 5-6 glasses of water to maintain adequate hydration.  They are to follow up with Dr. Ivory Broad this Friday for recheck of lytes, blood pressure and for further input on diabetes management if blood sugars start trending upwards off of metformin.  Rehab RN has been following the patient's wounds on right foot.  Aquacel was used to work on b.i.d. basis initially due to some drainage.  Currently these areas are healing well and home health RN to follow up on wound care.  During the patient's stay in rehab, weekly team conferences were held to monitor the patient's progress, set goals, as well as discuss barriers to discharge.  At admission, the patient was noted to be impaired by abnormal posture,  dominant hemiparesis, unbalance, muscle activation, and motor apraxia.  She required max assist with all mobility.  OT evaluation revealed the patient demonstrating left field inattention, making good attempts to move right arm and attending to right 50% of time.  She required max assist for all self-care tasks.  Speech therapy evaluation revealed the patient with moderate cognitive impairments characterized by impaired working memory, impaired intellectual awareness, decreased sustain attention, and issues with functional problem solving.  She presented moderate oropharyngeal dysphagia and was maintained on D2 diet, nectar liquids initially.  She required min verbal cues to utilize safe compensatory strategies.  During her stay in rehab, the patient has progressed to tolerating D3 diet, she is tolerating thin liquids without overt signs or symptoms of aspiration.  She does require occasional verbal cues to check for pocketing in right cheek.  Family has been educated regarding safe swallow strategies and will provide supervision past discharge.  The patient is currently able to follow complex conversation and directions with extra time.  She is able to express complex needs 90% of time.  She is 75-89% accurate for basic problem solving.  Recall is improved to 75-89%.  The patient is discharged to home at overall supervision level.  The patient's family is independent to provide necessary cognitive and dysphagia assistance and feels that the patient has returned to baseline if not better in terms of attention, vocal intensity as well as memory.  The patient has progressed in her ability to carry out ADL tasks.  She is currently at min assist level for bathing.  She continues to require mod assist for dressing and toileting.  She is showing improvement in functional use of right upper extremity with improvement in attention and coordination.  She is currently at standby assist for bed  mobility, min assist for transfers with tactile cues for right hand placement.  She is able to ambulate 100 feet with min assist with occasional verbal cues for safe use of rolling walker.  She is able to navigate 4 stairs with 1 rail with min assist and verbal cues for sequencing.  Family education has been done with the patient's daughter and 24-hour supervision/ assistance is recommended past discharge.  The patient is discharged to home on March 25, 2010 in improved condition.  DISCHARGE MEDICATIONS: 1. Coreg 25 mg p.o. b.i.d. 2. Plavix 75 mg a day. 3. Amaryl 2 mg p.o. per day. 4. Metronidazole 1 tab p.o. q.8 hours for additional 6 days. 5. Saccharomyces  boulardii 500 mg b.i.d. 6. Zocor 20 mg p.o. q.p.m. 7. Tylenol 500 mg q.i.d. p.r.n. pain.  DIET:  Carb modified medium.  ACTIVITY:  24-hour assistance by family.  Walk with walker and with assistance.  Bathe with assistance.  SPECIAL INSTRUCTIONS:  Drink at least 6 glasses of water or diet drinks daily.  Do not use Tenex, hydrocodone, or metformin.  [facility] to provide PT, OT, RN as well as speech therapy.  Follow up with Dr. Ivory Broad this Friday or next Monday for recheck of renal status, blood pressure, and input on diabetes management.  FOLLOWUP:  The patient to follow up with Dr. Claudette Laws, March 19, 9:30 for 10:00 a.m.  Follow up with Dr. Pearlean Brownie in 6 weeks.  Follow up with Dr. Ivory Broad in the next 3-5 days.     Delle Reining, P.A.   ______________________________ Erick Colace, M.D.    PL/MEDQ  D:  03/26/2011  T:  03/26/2011  Job:  [id number]  cc:   Nolon Nations, MD Pramod P. Pearlean Brownie, MD

## 2011-03-26 NOTE — Discharge Instructions (Signed)
Inpatient Rehab Discharge Instructions  Christina Pittman Discharge date and time: 03/26/11   Activities/Precautions/ Functional Status: Activity:  With assistance by family Diet: diabetic diet Wound Care: none needed Functional status:  ___ No restrictions     ___ Walk up steps independently _X_ 24/7 supervision/assistance   _X__ Walk up steps with assistance ___ Intermittent supervision/assistance  ___ Bathe/dress independently _X__ Walk with walker    _X__ Bathe/dress with assistance ___ Walk Independently    ___ Shower independently _X__ Walk with assistance    ___ Shower with assistance _X__ No alcohol     ___ Return to work/school ________  Special Instructions:  Drink at least six glasses of water( or diet drinks) daily.   COMMUNITY REFERRALS UPON DISCHARGE:    Home Health:   PT, OT, RN, SPT   Agency: [facility] Phone: [phone] Date of last service: 03/26/2011  Medical Equipment/Items Ordered: WHEELCHAIR, TUB BENCH, [facility]  Agency/Supplier: [facility]    [phone] Other: GAVE PRIVATE DUTY LIST TO FAMILY TO PURSUE IF NECESSARY  GENERAL COMMUNITY RESOURCES FOR PATIENT/FAMILY: Support Groups:[facility] SUPPORT GROUP  IF QUESTIONS OR CONCERNS ARISE, PLEASE CONTACT: Dossie Der, LCSW                                                                                                               [phone]   STROKE/TIA DISCHARGE INSTRUCTIONS SMOKING Cigarette smoking nearly doubles your risk of having a stroke & is the single most alterable risk factor  If you smoke or have smoked in the last 12 months, you are advised to quit smoking for your health.  Most of the excess cardiovascular risk related to smoking disappears within a year of stopping.  Ask you doctor about anti-smoking medications  [employer] Quit Line: 1-800-QUIT NOW  Free Smoking Cessation Classes [phone]  CHOLESTEROL Know your levels; limit fat & cholesterol in your diet  Lab Results  Component Value Date   CHOL 191 03/14/2011   HDL 50 03/14/2011   LDLCALC 118* 03/14/2011   TRIG 114 03/14/2011   CHOLHDL 3.8 03/14/2011      Many patients benefit from treatment even if their cholesterol is at goal.  Goal: Total Cholesterol less than 160  Goal:  LDL less than 100  Goal:  HDL greater than 40  Goal:  Triglycerides less than 150  BLOOD PRESSURE American Stroke Association blood pressure target is less that 120/80 mm/Hg  Your discharge blood pressure is:  BP: 136/64 mmHg  Monitor your blood pressure  Limit your salt and alcohol intake  Many individuals will require more than one medication for high blood pressure  DIABETES (A1c is a blood sugar average for last 3 months) Goal A1c is under 7% (A1c is blood sugar average for last 3 months)  Diabetes: Diagnosis of diabetes:  Your A1c:6.7 %    Lab Results  Component Value Date   HGBA1C 6.7* 03/14/2011    Your A1c can be lowered with medications, healthy diet, and exercise.  Check your blood sugar as directed by  your physician  Call your physician if you experience unexplained or low blood sugars.  PHYSICAL ACTIVITY/REHABILITATION Goal is 30 minutes at least 4 days per week    Activity decreases your risk of heart attack and stroke and makes your heart stronger.  It helps control your weight and blood pressure; helps you relax and can improve your mood.  Participate in a regular exercise program.  Talk with your doctor about the best form of exercise for you (dancing, walking, swimming, cycling).  DIET/WEIGHT Goal is to maintain a healthy weight  Your height is:    Your current weight is: Weight: 62.687 kg (138 lb 3.2 oz) Your body Mass Index (BMI) is:     Following the type of diet specifically designed for you will help prevent another stroke.  Your goal Body Mass Index (BMI) is 19-24.  Healthy food habits can help reduce 3 risk factors for stroke:  High cholesterol, hypertension, and excess weight.      My questions have been  answered and I understand these instructions. I will adhere to these goals and the provided educational materials after my discharge from the hospital.  Patient/Caregiver Signature _______________________________ Date __________  Clinician Signature _______________________________________ Date __________  Please bring this form and your medication list with you to all your follow-up doctor's appointments.

## 2011-03-26 NOTE — Progress Notes (Signed)
Patient ID: Christina Pittman, female   DOB: 1919-04-03, 78 y.o.   MRN: [id number] Subjective/Complaints:  Up early ready to go home. Review of Systems  Neurological: Positive for sensory change and focal weakness.  All other systems reviewed and are negative.   Objective: Vital Signs: Blood pressure 136/64, pulse 81, temperature 98.7 F (37.1 C), temperature source Oral, resp. rate 19, weight 62.687 kg (138 lb 3.2 oz), SpO2 92.00%. No results found. No results found for this basename: WBC:2,HGB:2,HCT:2,PLT:2 in the last 72 hours  Basename 03/25/11 1300 03/23/11 0900  NA 137 136  K 4.9 4.8  CL 108 106  CO2 20 20  GLUCOSE 229* 197*  BUN 33* 25*  CREATININE 1.21* 0.98  CALCIUM 9.1 9.1   CBG (last 3)   Basename 03/26/11 0730 03/25/11 2136 03/25/11 1628  GLUCAP 162* 170* 182*    Wt Readings from Last 3 Encounters:  03/20/11 62.687 kg (138 lb 3.2 oz)  03/13/11 61.78 kg (136 lb 3.2 oz)  10/28/10 63.957 kg (141 lb)    Physical Exam:  General appearance: alert, cooperative and no distress Head: Normocephalic, without obvious abnormality, atraumatic Eyes: EOMI and visual fields intact Nose: Nares normal. Septum midline. Mucosa normal. No drainage or sinus tenderness. Throat: lips, mucosa, and tongue normal; teeth and gums normal Neck: no adenopathy, no carotid bruit, no JVD, supple, symmetrical, trachea midline and thyroid not enlarged, symmetric, no tenderness/mass/nodules Back: symmetric, no curvature. ROM normal. No CVA tenderness. Resp: clear to auscultation bilaterally Cardio: regular rate and rhythm, S1, S2 normal, no murmur, click, rub or gallop and normal apical impulse GI: soft, non-tender; bowel sounds normal; no masses,  no organomegaly Extremities: extremities normal, atraumatic, no cyanosis or edema Pulses: 2+ and symmetric Skin: Skin color, texture, turgor normal. No rashes or lesions Neurologic: very alert.  Speech clearer today. RUE3/5 1/5 R grip RLE 3+/5. Sensory  1/2 on right.  Mild right 7 .  cognitvely very alert. Incision/Wound: chronic skin changes and chronic wounds on right foot   Assessment/Plan: 1. Functional deficits secondary to left cortical/subcortical infarcts which require 3+ hours per day of interdisciplinary therapy in a comprehensive inpatient rehab setting. Stable for D/C will need primary care f/u in 1 week Switch metformin to amaryl due to elevated BUN and Creat.Also D/C lisinopril due to same plus low BP. FIM: FIM - Bathing Bathing Steps Patient Completed: Chest;Right Arm;Abdomen;Right upper leg;Left upper leg;Right lower leg (including foot);Left lower leg (including foot);Front perineal area;Buttocks Bathing: 4: Min-Patient completes 8-9 67f 10 parts or 75+ percent  FIM - Upper Body Dressing/Undressing Upper body dressing/undressing steps patient completed: Thread/unthread right bra strap;Thread/unthread left bra strap;Thread/unthread right sleeve of pullover shirt/dresss;Put head through opening of pull over shirt/dress;Pull shirt over trunk Upper body dressing/undressing: 3: Mod-Patient completed 50-74% of tasks FIM - Lower Body Dressing/Undressing Lower body dressing/undressing steps patient completed: Thread/unthread right pants leg;Thread/unthread left pants leg Lower body dressing/undressing: 3: Mod-Patient completed 50-74% of tasks  FIM - Toileting Toileting steps completed by patient: Performs perineal hygiene Toileting Assistive Devices: Grab bar or rail for support Toileting: 3: Mod-Patient completed 2 of 3 steps  FIM - Diplomatic Services operational officer Devices: Psychiatrist Transfers: 3-From toilet/BSC: Mod A (lift or lower assist) Toilet Transfers DO NOT USE: 0-Activity did not occur  FIM - Banker Devices: Environmental consultant;Bed rails Bed/Chair Transfer: 3: Sit > Supine: Mod A (lifting assist/Pt. 50-74%/lift 2 legs)  FIM - Locomotion: Wheelchair Locomotion:  Wheelchair: 1: Total Assistance/staff pushes wheelchair (  Pt<25%) FIM - Locomotion: Ambulation Locomotion: Ambulation Assistive Devices: Walker - Rolling Ambulation/Gait Assistance: 4: Min assist Locomotion: Ambulation: 2: Travels 50 - 149 ft with minimal assistance (Pt.>75%)  Comprehension Comprehension Mode: Auditory Comprehension: 6-Follows complex conversation/direction: With extra time/assistive device  Expression Expression Mode: Verbal Expression: 5-Expresses complex 90% of the time/cues < 10% of the time  Social Interaction Social Interaction: 6-Interacts appropriately with others with medication or extra time (anti-anxiety, antidepressant).  Problem Solving Problem Solving: 4-Solves basic 75 - 89% of the time/requires cueing 10 - 24% of the time  Memory Memory: 4-Recognizes or recalls 75 - 89% of the time/requires cueing 10 - 24% of the time  1. DVT Prophylaxis/Anticoagulation: Pharmaceutical: Lovenox  2. Pain Management: use tylenol prn for LBP. Also has pain over the right first toe and metatarsals. Protect skin 3. Mood: showing some signs of depression 4. DM type 2:see above           5 HTN: Monitor with bid checks. Continue lisinopril (reduce to 20mg ) and increase coreg.monitor orthostasis 6. Dyslipidemia: Continue zocor daily.  7. wound care: Continue pressure relief and dressing as appropriate. These wounds are chronic.  8. FEN- note eating really well.  Appetite improving.  off megace 10-90% meals  BMET inam  -family and staff are pushing po fluids yesterday 9. Chronic toe ulcer- calcium alginate dressing 10.  C diff colitis flagyll started 2/18 cont through 3/4 LOS (Days) 11 A FACE TO FACE EVALUATION WAS PERFORMED  Kaedance Magos E 03/26/2011, 7:55 AM

## 2011-03-26 NOTE — Progress Notes (Signed)
Patient discharged aprox 79 with son and daughter and family. No further questions asked. Provided son with a list of medications already given this am. Patient wheeled downstairs by NT.

## 2011-03-26 NOTE — Progress Notes (Signed)
Social Work Discharge Note Discharge Note  The overall goal for the admission was met for:   Discharge location: Yes-HOME WITH FAMILY PROVIDING 24 HOUR CARE  Length of Stay: Yes-11 DAYS  Discharge activity level: Yes-SUPERVISION/MIN LEVEL  Home/community participation: Yes  Services provided included: MD, RD, PT, OT, SLP, RN, CM, TR, Pharmacy and SW  Financial Services: Medicare  Follow-up services arranged: Home Health: [facility]-PT,OT,RN,SPT, DME: [facility]-WHEELCHA[facility], [facility] and Patient/Family has no preference for HH/DME agencies  Comments (or additional information): FAMILY EDUCATION COMPLETED, FAMILY IS COMFORTABLE WITH HER CARE  Patient/Family verbalized understanding of follow-up arrangements: Yes  Individual responsible for coordination of the follow-up plan: GAIL-DAUGHTER  Confirmed correct DME delivered: Lucy Chris 03/26/2011    Lucy Chris

## 2011-03-27 DIAGNOSIS — E119 Type 2 diabetes mellitus without complications: Secondary | ICD-10-CM | POA: Diagnosis not present

## 2011-03-27 DIAGNOSIS — I69959 Hemiplegia and hemiparesis following unspecified cerebrovascular disease affecting unspecified side: Secondary | ICD-10-CM | POA: Diagnosis not present

## 2011-03-27 DIAGNOSIS — C751 Malignant neoplasm of pituitary gland: Secondary | ICD-10-CM | POA: Diagnosis not present

## 2011-03-27 DIAGNOSIS — I69923 Fluency disorder following unspecified cerebrovascular disease: Secondary | ICD-10-CM | POA: Diagnosis not present

## 2011-03-27 DIAGNOSIS — I10 Essential (primary) hypertension: Secondary | ICD-10-CM | POA: Diagnosis not present

## 2011-03-27 NOTE — Progress Notes (Signed)
Physical Therapy Discharge Summary  Patient Details  Name: Rhylee Pucillo MRN: [id number] Date of Birth: 05/11/1919  Today's Date: 03/27/2011   Late entry for discharge on 03/26/11. Discharge summary written based on chart review and discussion with team members who treated this patient.  Patient has met 7 of 8 long term goals due to improved activity tolerance, improved balance, improved postural control, increased strength, ability to compensate for deficits, and improved attention.  Patient to discharge at an ambulatory level Min Assist.     Reasons goals not met: Car transfer was not addressed during therapy sessions per documentation.  Recommendation:  Patient will benefit from ongoing skilled PT services in home health setting to continue to advance safe functional mobility, address ongoing impairments in balance, strength, activity tolerance, and minimize fall risk.  Equipment: W/C, cushion. Patient owns RW to be used for all mobility at this time.  Reasons for discharge: discharge from hospital  Patient/family agrees with progress made and goals achieved: Yes  See FIM for current functional status  Romeo Rabon 03/27/2011, 2:08 PM

## 2011-03-28 DIAGNOSIS — I10 Essential (primary) hypertension: Secondary | ICD-10-CM | POA: Diagnosis not present

## 2011-03-28 DIAGNOSIS — C751 Malignant neoplasm of pituitary gland: Secondary | ICD-10-CM | POA: Diagnosis not present

## 2011-03-28 DIAGNOSIS — I69959 Hemiplegia and hemiparesis following unspecified cerebrovascular disease affecting unspecified side: Secondary | ICD-10-CM | POA: Diagnosis not present

## 2011-03-28 DIAGNOSIS — I69923 Fluency disorder following unspecified cerebrovascular disease: Secondary | ICD-10-CM | POA: Diagnosis not present

## 2011-03-28 DIAGNOSIS — E119 Type 2 diabetes mellitus without complications: Secondary | ICD-10-CM | POA: Diagnosis not present

## 2011-03-29 DIAGNOSIS — E119 Type 2 diabetes mellitus without complications: Secondary | ICD-10-CM | POA: Diagnosis not present

## 2011-03-29 DIAGNOSIS — C752 Malignant neoplasm of craniopharyngeal duct: Secondary | ICD-10-CM | POA: Diagnosis not present

## 2011-03-29 DIAGNOSIS — L03119 Cellulitis of unspecified part of limb: Secondary | ICD-10-CM | POA: Diagnosis not present

## 2011-03-29 DIAGNOSIS — I69959 Hemiplegia and hemiparesis following unspecified cerebrovascular disease affecting unspecified side: Secondary | ICD-10-CM | POA: Diagnosis not present

## 2011-03-29 DIAGNOSIS — I6990 Unspecified sequelae of unspecified cerebrovascular disease: Secondary | ICD-10-CM | POA: Diagnosis not present

## 2011-03-29 DIAGNOSIS — I10 Essential (primary) hypertension: Secondary | ICD-10-CM | POA: Diagnosis not present

## 2011-03-29 DIAGNOSIS — I69923 Fluency disorder following unspecified cerebrovascular disease: Secondary | ICD-10-CM | POA: Diagnosis not present

## 2011-04-01 ENCOUNTER — Telehealth: Payer: Self-pay | Admitting: Physical Medicine & Rehabilitation

## 2011-04-01 DIAGNOSIS — C752 Malignant neoplasm of craniopharyngeal duct: Secondary | ICD-10-CM | POA: Diagnosis not present

## 2011-04-01 DIAGNOSIS — I69959 Hemiplegia and hemiparesis following unspecified cerebrovascular disease affecting unspecified side: Secondary | ICD-10-CM | POA: Diagnosis not present

## 2011-04-01 DIAGNOSIS — E119 Type 2 diabetes mellitus without complications: Secondary | ICD-10-CM | POA: Diagnosis not present

## 2011-04-01 DIAGNOSIS — I69923 Fluency disorder following unspecified cerebrovascular disease: Secondary | ICD-10-CM | POA: Diagnosis not present

## 2011-04-01 DIAGNOSIS — I10 Essential (primary) hypertension: Secondary | ICD-10-CM | POA: Diagnosis not present

## 2011-04-01 NOTE — Telephone Encounter (Signed)
OT evall on 03/28/11. Plan   1wk/1, 2wk/ 8

## 2011-04-01 NOTE — Telephone Encounter (Signed)
Notified Christina Pittman by voicemail that orders were approved.

## 2011-04-01 NOTE — Telephone Encounter (Signed)
Is this ok to approve? Advise thanks

## 2011-04-01 NOTE — Telephone Encounter (Signed)
Yes, thanks

## 2011-04-02 ENCOUNTER — Encounter (HOSPITAL_BASED_OUTPATIENT_CLINIC_OR_DEPARTMENT_OTHER): Payer: Medicare Other

## 2011-04-03 DIAGNOSIS — C752 Malignant neoplasm of craniopharyngeal duct: Secondary | ICD-10-CM | POA: Diagnosis not present

## 2011-04-03 DIAGNOSIS — I69959 Hemiplegia and hemiparesis following unspecified cerebrovascular disease affecting unspecified side: Secondary | ICD-10-CM | POA: Diagnosis not present

## 2011-04-03 DIAGNOSIS — E119 Type 2 diabetes mellitus without complications: Secondary | ICD-10-CM | POA: Diagnosis not present

## 2011-04-03 DIAGNOSIS — I10 Essential (primary) hypertension: Secondary | ICD-10-CM | POA: Diagnosis not present

## 2011-04-03 DIAGNOSIS — I69923 Fluency disorder following unspecified cerebrovascular disease: Secondary | ICD-10-CM | POA: Diagnosis not present

## 2011-04-04 ENCOUNTER — Ambulatory Visit: Payer: Medicare Other | Admitting: Family Medicine

## 2011-04-05 DIAGNOSIS — I10 Essential (primary) hypertension: Secondary | ICD-10-CM | POA: Diagnosis not present

## 2011-04-05 DIAGNOSIS — E119 Type 2 diabetes mellitus without complications: Secondary | ICD-10-CM | POA: Diagnosis not present

## 2011-04-05 DIAGNOSIS — I69923 Fluency disorder following unspecified cerebrovascular disease: Secondary | ICD-10-CM | POA: Diagnosis not present

## 2011-04-05 DIAGNOSIS — I69959 Hemiplegia and hemiparesis following unspecified cerebrovascular disease affecting unspecified side: Secondary | ICD-10-CM | POA: Diagnosis not present

## 2011-04-05 DIAGNOSIS — C751 Malignant neoplasm of pituitary gland: Secondary | ICD-10-CM | POA: Diagnosis not present

## 2011-04-08 ENCOUNTER — Telehealth: Payer: Self-pay | Admitting: Physical Medicine & Rehabilitation

## 2011-04-08 DIAGNOSIS — R079 Chest pain, unspecified: Secondary | ICD-10-CM | POA: Diagnosis not present

## 2011-04-08 DIAGNOSIS — M949 Disorder of cartilage, unspecified: Secondary | ICD-10-CM | POA: Diagnosis not present

## 2011-04-08 DIAGNOSIS — I69959 Hemiplegia and hemiparesis following unspecified cerebrovascular disease affecting unspecified side: Secondary | ICD-10-CM | POA: Diagnosis not present

## 2011-04-08 DIAGNOSIS — C751 Malignant neoplasm of pituitary gland: Secondary | ICD-10-CM | POA: Diagnosis not present

## 2011-04-08 DIAGNOSIS — M899 Disorder of bone, unspecified: Secondary | ICD-10-CM | POA: Diagnosis not present

## 2011-04-08 DIAGNOSIS — I10 Essential (primary) hypertension: Secondary | ICD-10-CM | POA: Diagnosis not present

## 2011-04-08 DIAGNOSIS — I69923 Fluency disorder following unspecified cerebrovascular disease: Secondary | ICD-10-CM | POA: Diagnosis not present

## 2011-04-08 DIAGNOSIS — J9819 Other pulmonary collapse: Secondary | ICD-10-CM | POA: Diagnosis not present

## 2011-04-08 DIAGNOSIS — E119 Type 2 diabetes mellitus without complications: Secondary | ICD-10-CM | POA: Diagnosis not present

## 2011-04-08 NOTE — Telephone Encounter (Signed)
FYI - Klohe had fall Thursday night or  Friday morning.  Hit head on nightstand.  Today during visit, patient c/o pain when she breathes.  Patient also on Coumadin.  Advised daughter to take to ED.

## 2011-04-08 NOTE — Telephone Encounter (Signed)
Noted  

## 2011-04-09 ENCOUNTER — Ambulatory Visit (INDEPENDENT_AMBULATORY_CARE_PROVIDER_SITE_OTHER): Payer: Medicare Other | Admitting: Family Medicine

## 2011-04-09 ENCOUNTER — Encounter: Payer: Self-pay | Admitting: Family Medicine

## 2011-04-09 VITALS — BP 112/75 | HR 90 | Temp 98.4°F | Ht 63.25 in | Wt 142.0 lb

## 2011-04-09 DIAGNOSIS — I872 Venous insufficiency (chronic) (peripheral): Secondary | ICD-10-CM | POA: Insufficient documentation

## 2011-04-09 DIAGNOSIS — E785 Hyperlipidemia, unspecified: Secondary | ICD-10-CM | POA: Diagnosis not present

## 2011-04-09 DIAGNOSIS — I69923 Fluency disorder following unspecified cerebrovascular disease: Secondary | ICD-10-CM | POA: Diagnosis not present

## 2011-04-09 DIAGNOSIS — I6350 Cerebral infarction due to unspecified occlusion or stenosis of unspecified cerebral artery: Secondary | ICD-10-CM | POA: Diagnosis not present

## 2011-04-09 DIAGNOSIS — I639 Cerebral infarction, unspecified: Secondary | ICD-10-CM

## 2011-04-09 DIAGNOSIS — C752 Malignant neoplasm of craniopharyngeal duct: Secondary | ICD-10-CM | POA: Diagnosis not present

## 2011-04-09 DIAGNOSIS — I10 Essential (primary) hypertension: Secondary | ICD-10-CM

## 2011-04-09 DIAGNOSIS — W19XXXA Unspecified fall, initial encounter: Secondary | ICD-10-CM | POA: Insufficient documentation

## 2011-04-09 DIAGNOSIS — E119 Type 2 diabetes mellitus without complications: Secondary | ICD-10-CM

## 2011-04-09 DIAGNOSIS — I69959 Hemiplegia and hemiparesis following unspecified cerebrovascular disease affecting unspecified side: Secondary | ICD-10-CM | POA: Diagnosis not present

## 2011-04-09 LAB — BASIC METABOLIC PANEL
BUN: 26 mg/dL — ABNORMAL HIGH (ref 6–23)
Creatinine, Ser: 1.1 mg/dL (ref 0.4–1.2)
GFR: 50.14 mL/min — ABNORMAL LOW (ref >60.00)

## 2011-04-09 MED ORDER — FUROSEMIDE 20 MG PO TABS: 20.0000 mg | ORAL_TABLET | Freq: Every day | ORAL | Status: DC

## 2011-04-09 MED ORDER — TRAMADOL HCL 50 MG PO TABS: 50.0000 mg | ORAL_TABLET | Freq: Three times a day (TID) | ORAL | Status: DC | PRN

## 2011-04-09 NOTE — Patient Instructions (Signed)
Follow up in 1 month to recheck swelling and repeat lab work Start the Lasix (fluid pill) daily Do not get up w/out assistance Call with any questions or concerns Hang in there!

## 2011-04-09 NOTE — Progress Notes (Signed)
  Subjective:    Patient ID: Christina Pittman, female    DOB: 08/14/1919, 78 y.o.   MRN: [id number]  HPI New to establish.  Previous MD- Ameen, [place].  Neuro- [facility]  Fall- fell 3-4 nights ago, went to [facility] yesterday.  Had negative CXR.  Has large bruise on face.  Was given tylenol w/ codeine but this is confusing her thought process.  Family wants pain meds that won't cause cognitive problems.  CVA- occurred on 2/13.  Was previously living alone but now has family present 24/7.  Having PT.  R hand paresis.  Son wants to know when she'll be 'back to normal' and 'will she have another one?'.  Pt does not have medical POA or living will.  DM- reportedly A1C hovers around 7.  On Amaryl for this.  Denies symptomatic lows.  Uncertain as to last eye exam.  Denies CP, SOB, HAs, visual changes  Hyperlipidemia- on Zocor due to recent stroke.  Tolerating w/out abd pain, N/V.  HTN- on Coreg.  BP well controlled.  Denies CP w/ exception of bruised ribs.  'oh by the way... Look how swollen her legs are.  They ooze fluid.  It's gross'.   Review of Systems For ROS see HPI     Objective:   Physical Exam  Vitals reviewed. Constitutional:       Frail woman in wheel chair.  HENT:       Large bruise on L side of face and around eye.  Eyes: Conjunctivae and EOM are normal. Pupils are equal, round, and reactive to light.  Cardiovascular: Normal rate, regular rhythm and intact distal pulses.   Pulmonary/Chest: Effort normal and breath sounds normal. No respiratory distress. She has no wheezes. She has no rales.  Musculoskeletal: She exhibits edema (R hand swelling likely due to paralysis.  also w/ extensive pitting edema of lower extremities bilaterally, weeping).  Neurological: She is alert.  Skin: Skin is warm and dry.  Psychiatric:       Slowed thought process, difficulty answering questions          Assessment & Plan:

## 2011-04-10 DIAGNOSIS — I69959 Hemiplegia and hemiparesis following unspecified cerebrovascular disease affecting unspecified side: Secondary | ICD-10-CM | POA: Diagnosis not present

## 2011-04-10 DIAGNOSIS — E119 Type 2 diabetes mellitus without complications: Secondary | ICD-10-CM | POA: Diagnosis not present

## 2011-04-10 DIAGNOSIS — C752 Malignant neoplasm of craniopharyngeal duct: Secondary | ICD-10-CM | POA: Diagnosis not present

## 2011-04-10 DIAGNOSIS — I10 Essential (primary) hypertension: Secondary | ICD-10-CM | POA: Diagnosis not present

## 2011-04-10 DIAGNOSIS — I69923 Fluency disorder following unspecified cerebrovascular disease: Secondary | ICD-10-CM | POA: Diagnosis not present

## 2011-04-12 ENCOUNTER — Encounter: Payer: Self-pay | Admitting: Physical Medicine & Rehabilitation

## 2011-04-12 ENCOUNTER — Encounter (HOSPITAL_BASED_OUTPATIENT_CLINIC_OR_DEPARTMENT_OTHER): Payer: Medicare Other | Attending: General Surgery

## 2011-04-12 ENCOUNTER — Telehealth: Payer: Self-pay

## 2011-04-12 DIAGNOSIS — L97809 Non-pressure chronic ulcer of other part of unspecified lower leg with unspecified severity: Secondary | ICD-10-CM | POA: Insufficient documentation

## 2011-04-12 DIAGNOSIS — I872 Venous insufficiency (chronic) (peripheral): Secondary | ICD-10-CM | POA: Diagnosis not present

## 2011-04-12 DIAGNOSIS — E1169 Type 2 diabetes mellitus with other specified complication: Secondary | ICD-10-CM | POA: Insufficient documentation

## 2011-04-12 DIAGNOSIS — L97509 Non-pressure chronic ulcer of other part of unspecified foot with unspecified severity: Secondary | ICD-10-CM | POA: Diagnosis not present

## 2011-04-12 DIAGNOSIS — I10 Essential (primary) hypertension: Secondary | ICD-10-CM | POA: Insufficient documentation

## 2011-04-12 NOTE — Telephone Encounter (Signed)
Labs look good. Will start Lasix as discussed at OV      Patient has been made aware and voiced understanding--- No questions at this time      KP

## 2011-04-15 DIAGNOSIS — I69923 Fluency disorder following unspecified cerebrovascular disease: Secondary | ICD-10-CM | POA: Diagnosis not present

## 2011-04-15 DIAGNOSIS — I69959 Hemiplegia and hemiparesis following unspecified cerebrovascular disease affecting unspecified side: Secondary | ICD-10-CM | POA: Diagnosis not present

## 2011-04-15 DIAGNOSIS — C751 Malignant neoplasm of pituitary gland: Secondary | ICD-10-CM | POA: Diagnosis not present

## 2011-04-15 DIAGNOSIS — E119 Type 2 diabetes mellitus without complications: Secondary | ICD-10-CM | POA: Diagnosis not present

## 2011-04-15 DIAGNOSIS — I10 Essential (primary) hypertension: Secondary | ICD-10-CM | POA: Diagnosis not present

## 2011-04-16 ENCOUNTER — Inpatient Hospital Stay: Payer: Medicare Other | Admitting: Physical Medicine & Rehabilitation

## 2011-04-16 DIAGNOSIS — E119 Type 2 diabetes mellitus without complications: Secondary | ICD-10-CM | POA: Diagnosis not present

## 2011-04-16 DIAGNOSIS — I10 Essential (primary) hypertension: Secondary | ICD-10-CM | POA: Diagnosis not present

## 2011-04-16 DIAGNOSIS — I69923 Fluency disorder following unspecified cerebrovascular disease: Secondary | ICD-10-CM | POA: Diagnosis not present

## 2011-04-16 DIAGNOSIS — I69959 Hemiplegia and hemiparesis following unspecified cerebrovascular disease affecting unspecified side: Secondary | ICD-10-CM | POA: Diagnosis not present

## 2011-04-16 DIAGNOSIS — C751 Malignant neoplasm of pituitary gland: Secondary | ICD-10-CM | POA: Diagnosis not present

## 2011-04-17 ENCOUNTER — Emergency Department (HOSPITAL_COMMUNITY)
Admission: EM | Admit: 2011-04-17 | Discharge: 2011-04-17 | Discharge status: Against Medical Advice | Payer: Medicare Other | Attending: Emergency Medicine | Admitting: Emergency Medicine

## 2011-04-17 ENCOUNTER — Telehealth: Payer: Self-pay | Admitting: Physical Medicine & Rehabilitation

## 2011-04-17 ENCOUNTER — Encounter (HOSPITAL_COMMUNITY): Payer: Self-pay | Admitting: *Deleted

## 2011-04-17 ENCOUNTER — Telehealth: Payer: Self-pay | Admitting: Family Medicine

## 2011-04-17 DIAGNOSIS — M79609 Pain in unspecified limb: Secondary | ICD-10-CM | POA: Diagnosis not present

## 2011-04-17 DIAGNOSIS — I10 Essential (primary) hypertension: Secondary | ICD-10-CM | POA: Diagnosis not present

## 2011-04-17 DIAGNOSIS — I69959 Hemiplegia and hemiparesis following unspecified cerebrovascular disease affecting unspecified side: Secondary | ICD-10-CM | POA: Diagnosis not present

## 2011-04-17 DIAGNOSIS — C751 Malignant neoplasm of pituitary gland: Secondary | ICD-10-CM | POA: Diagnosis not present

## 2011-04-17 DIAGNOSIS — Z9181 History of falling: Secondary | ICD-10-CM | POA: Diagnosis not present

## 2011-04-17 DIAGNOSIS — R609 Edema, unspecified: Secondary | ICD-10-CM | POA: Diagnosis not present

## 2011-04-17 DIAGNOSIS — W19XXXA Unspecified fall, initial encounter: Secondary | ICD-10-CM

## 2011-04-17 DIAGNOSIS — E119 Type 2 diabetes mellitus without complications: Secondary | ICD-10-CM | POA: Diagnosis not present

## 2011-04-17 DIAGNOSIS — I69923 Fluency disorder following unspecified cerebrovascular disease: Secondary | ICD-10-CM | POA: Diagnosis not present

## 2011-04-17 DIAGNOSIS — Z8673 Personal history of transient ischemic attack (TIA), and cerebral infarction without residual deficits: Secondary | ICD-10-CM | POA: Diagnosis not present

## 2011-04-17 DIAGNOSIS — S59909A Unspecified injury of unspecified elbow, initial encounter: Secondary | ICD-10-CM

## 2011-04-17 HISTORY — DX: Cerebral infarction, unspecified: I63.9

## 2011-04-17 NOTE — Telephone Encounter (Signed)
Call from [facility] Nurse with Christina Pittman and he stated he can out to the home and the patient has fallen, she has a wound on the arm and edema to the right foot, left arm and bruising across the chest and they would like to get chest Xray's done. OK per Dr.Lowne. I spoke with [name] and she is aware and she made me aware that [name] is still with them, he got on the phone and advised he will transport the patient to [facility] for the X-rays.

## 2011-04-17 NOTE — Telephone Encounter (Signed)
Please advise 

## 2011-04-17 NOTE — ED Notes (Signed)
Pt left AMA, signed AMA forms, RN notified at [phone]

## 2011-04-17 NOTE — Telephone Encounter (Signed)
Patient's daughter, [name], states the pt needs a chest xray. She fell a couple weeks ago and was seen at [facility] and the xray did not reveal anything. Patient would like to go to the [facility]. location.

## 2011-04-17 NOTE — Telephone Encounter (Signed)
Current pain med, Tramadol, not very effective.  Patient is experiencing pain in lower extremeties and chest area from where she had a fall.  Can Dr rx her something a bit stronger ?

## 2011-04-17 NOTE — ED Notes (Addendum)
Pt states she fell several weeks ago hitting chest on a night stand. Pt reports unknown amount of time she has had right foot pain. Pt reports left shoulder pain from fall 2 days. Pt uses walker at home. Pt with hx of stroke. Pt with decreased strength to right hand from stroke. Family reports weeping to foot from swelling, 2 weeks ago. Pt with pitting edema noted to lower extremities, family reports swelling has decreased from yesterday. Family reports she had xray of chest after fall. Pt states she hit her right foot on something prior to feb.

## 2011-04-18 ENCOUNTER — Emergency Department (HOSPITAL_COMMUNITY)
Admission: EM | Admit: 2011-04-18 | Discharge: 2011-04-19 | Disposition: A | Discharge status: Home / Self Care | Payer: Medicare Other | Attending: Emergency Medicine | Admitting: Emergency Medicine

## 2011-04-18 ENCOUNTER — Encounter (HOSPITAL_COMMUNITY): Payer: Self-pay | Admitting: Emergency Medicine

## 2011-04-18 ENCOUNTER — Telehealth: Payer: Self-pay | Admitting: *Deleted

## 2011-04-18 ENCOUNTER — Emergency Department (INDEPENDENT_AMBULATORY_CARE_PROVIDER_SITE_OTHER): Payer: Medicare Other

## 2011-04-18 ENCOUNTER — Telehealth (HOSPITAL_COMMUNITY): Payer: Self-pay | Admitting: *Deleted

## 2011-04-18 ENCOUNTER — Emergency Department (INDEPENDENT_AMBULATORY_CARE_PROVIDER_SITE_OTHER)
Admission: EM | Admit: 2011-04-18 | Discharge: 2011-04-18 | Disposition: A | Discharge status: Nursing Home (Includes ICF) | Payer: Medicare Other | Source: Home / Self Care

## 2011-04-18 DIAGNOSIS — I10 Essential (primary) hypertension: Secondary | ICD-10-CM | POA: Insufficient documentation

## 2011-04-18 DIAGNOSIS — J90 Pleural effusion, not elsewhere classified: Secondary | ICD-10-CM

## 2011-04-18 DIAGNOSIS — J189 Pneumonia, unspecified organism: Secondary | ICD-10-CM | POA: Diagnosis not present

## 2011-04-18 DIAGNOSIS — R05 Cough: Secondary | ICD-10-CM | POA: Insufficient documentation

## 2011-04-18 DIAGNOSIS — M79609 Pain in unspecified limb: Secondary | ICD-10-CM | POA: Insufficient documentation

## 2011-04-18 DIAGNOSIS — M7989 Other specified soft tissue disorders: Secondary | ICD-10-CM | POA: Diagnosis not present

## 2011-04-18 DIAGNOSIS — E119 Type 2 diabetes mellitus without complications: Secondary | ICD-10-CM | POA: Insufficient documentation

## 2011-04-18 DIAGNOSIS — W1809XA Striking against other object with subsequent fall, initial encounter: Secondary | ICD-10-CM | POA: Insufficient documentation

## 2011-04-18 DIAGNOSIS — R059 Cough, unspecified: Secondary | ICD-10-CM | POA: Diagnosis not present

## 2011-04-18 DIAGNOSIS — R079 Chest pain, unspecified: Secondary | ICD-10-CM | POA: Diagnosis not present

## 2011-04-18 DIAGNOSIS — R011 Cardiac murmur, unspecified: Secondary | ICD-10-CM | POA: Diagnosis not present

## 2011-04-18 DIAGNOSIS — C751 Malignant neoplasm of pituitary gland: Secondary | ICD-10-CM | POA: Diagnosis not present

## 2011-04-18 DIAGNOSIS — I69923 Fluency disorder following unspecified cerebrovascular disease: Secondary | ICD-10-CM | POA: Diagnosis not present

## 2011-04-18 DIAGNOSIS — I712 Thoracic aortic aneurysm, without rupture: Secondary | ICD-10-CM | POA: Diagnosis not present

## 2011-04-18 DIAGNOSIS — S40029A Contusion of unspecified upper arm, initial encounter: Secondary | ICD-10-CM | POA: Diagnosis not present

## 2011-04-18 DIAGNOSIS — Y92009 Unspecified place in unspecified non-institutional (private) residence as the place of occurrence of the external cause: Secondary | ICD-10-CM | POA: Insufficient documentation

## 2011-04-18 DIAGNOSIS — S40022A Contusion of left upper arm, initial encounter: Secondary | ICD-10-CM

## 2011-04-18 DIAGNOSIS — Z8679 Personal history of other diseases of the circulatory system: Secondary | ICD-10-CM | POA: Insufficient documentation

## 2011-04-18 DIAGNOSIS — R918 Other nonspecific abnormal finding of lung field: Secondary | ICD-10-CM | POA: Diagnosis not present

## 2011-04-18 DIAGNOSIS — I69959 Hemiplegia and hemiparesis following unspecified cerebrovascular disease affecting unspecified side: Secondary | ICD-10-CM | POA: Diagnosis not present

## 2011-04-18 LAB — CBC
HCT: 35.6 % — ABNORMAL LOW (ref 36.0–46.0)
MCHC: 33.7 g/dL (ref 30.0–36.0)
Platelets: 372 10*3/uL (ref 150–400)
RDW: 13.5 % (ref 11.5–15.5)
WBC: 14.3 10*3/uL — ABNORMAL HIGH (ref 4.0–10.5)

## 2011-04-18 LAB — DIFFERENTIAL
Basophils Absolute: 0 10*3/uL (ref 0.0–0.1)
Basophils Relative: 0 % (ref 0–1)
Monocytes Absolute: 1.5 10*3/uL — ABNORMAL HIGH (ref 0.1–1.0)
Neutro Abs: 10.2 10*3/uL — ABNORMAL HIGH (ref 1.7–7.7)
Neutrophils Relative %: 71 % (ref 43–77)

## 2011-04-18 NOTE — Telephone Encounter (Signed)
Called [name] from [facility] to get an update on pt per noted [name] was going to pt home today to evaluate, [name] advised that he went to the home and pt stated she went to [facility] and was trying to get an x-ray but they waited in hospital for 7 hours with no resolve to have x-ray, spoke to [place] and he advised that the pt noted as fell last week and had an x-ray with no fractures/breaks/etc noted however pt has fallen again and currently has pain in her right foot but pt not wanting to go back to ER per experience yesterday, I apologized to pt via rep [name] as to the experience she had waiting for her x-ray yesterday, [name] also wanted to note that the pt wound care is going well, the edema has gone from a +4 to a less than 1 on the scale, no weeping noted as well, delegated verbal orders to [place] per MD Alwyn Ren to have pt be seen in [facility] at [address] per pt should not have to wait that long there, they will also assist with any pain she is experiencing, [name] understood and will advise pt to go to [facility].

## 2011-04-18 NOTE — Telephone Encounter (Signed)
Went AMA from ED 3/20.  Seen by PCP 3/12 T#3 caused confusion.  Not many other options.  I would need to see to eval.

## 2011-04-18 NOTE — ED Notes (Signed)
PT. FELL 2 DAYS AGO , SEEN AT [facility] URGENT CARE THIS EVENING SENT HERE FOR FURTHER EVALUATION OF RIGHT PLEURAL EFFUSION ( X-RAY DONE AT URGENT CARE). PT. ALSO REPORTS LEFT ANTERIOR RIBCAGE PAIN AND LOW BACK PAIN , RESPIRATIONS UNLABORED.

## 2011-04-18 NOTE — ED Provider Notes (Signed)
History     CSN: [id number]  Arrival date & time 04/18/11  1854   None     Chief Complaint  Patient presents with  . Chest Pain  . Foot Pain    (Consider location/radiation/quality/duration/timing/severity/associated sxs/prior treatment) HPI Comments: Patient presents this evening with her daughter. She had a stroke December 2012. She has been under care of family, and is currently staying at her daughters house. She has had 2 falls recently, most recent 2 days ago. She got up unassisted to walk to the kitchen and fell landing on the rocking chair. She has bruising and pain of her left upper arm, and increased pain right chest. She was already having pain in her right chest from fall 2 weeks ago. Was seen at another urgent care and reportedly xray was negative. The daughter states that "[name]" wants her to have an xray of her foot also. Pt states that she did not injure her foot in the fall and this is not related.    Past Medical History  Diagnosis Date  . Arthritis   . Diabetes mellitus   . Hypertension   . Heart murmur   . Chicken pox   . Stroke     Past Surgical History  Procedure Date  . Abdominal hysterectomy     Family History  Problem Relation Age of Onset  . Arthritis Mother   . Diabetes Sister   . Heart disease Brother   . Diabetes Brother     History  Substance Use Topics  . Smoking status: Never Smoker   . Smokeless tobacco: Not on file  . Alcohol Use: No    OB History    Grav Para Term Preterm Abortions TAB SAB Ect Mult Living                  Review of Systems  Allergies  Betadine and Neosporin  Home Medications   Current Outpatient Rx  Name Route Sig Dispense Refill  . ACETAMINOPHEN 500 MG PO TABS Oral Take 500 mg by mouth every 6 (six) hours as needed. For pain    . ACETAMINOPHEN-CODEINE #3 300-30 MG PO TABS Oral Take 1 tablet by mouth every 8 (eight) hours.    Marland Kitchen CARVEDILOL 25 MG PO TABS Oral Take 1 tablet (25 mg total) by mouth 2 (two)  times daily with a meal. 60 tablet 1  . CLOPIDOGREL BISULFATE 75 MG PO TABS Oral Take 1 tablet (75 mg total) by mouth daily with breakfast. 30 tablet 1  . FUROSEMIDE 20 MG PO TABS Oral Take 1 tablet (20 mg total) by mouth daily. 30 tablet 3  . GLIMEPIRIDE 2 MG PO TABS Oral Take 1 tablet (2 mg total) by mouth daily with breakfast. 30 tablet 1  . SIMVASTATIN 20 MG PO TABS Oral Take 1 tablet (20 mg total) by mouth daily at 6 PM. 30 tablet 1  . TRAMADOL HCL 50 MG PO TABS Oral Take 1 tablet (50 mg total) by mouth every 8 (eight) hours as needed for pain. 30 tablet 0    BP 147/92  Pulse 90  Temp(Src) 98.5 F (36.9 C) (Oral)  Resp 20  SpO2 98%  Physical Exam  Nursing note and vitals reviewed. Constitutional: Vital signs are normal. She is cooperative. No distress.       Thin, frail, elderly female.   HENT:  Head: Normocephalic and atraumatic.  Cardiovascular: Normal rate, regular rhythm and normal heart sounds.   Pulmonary/Chest: Effort normal and breath  sounds normal. No respiratory distress. She exhibits tenderness (TTP Rt sternal border). She exhibits no crepitus and no edema.  Musculoskeletal:       Left upper arm: She exhibits tenderness. She exhibits no bony tenderness, no swelling and no deformity.  Neurological: She is alert.  Skin: Skin is warm and dry. Bruising (Dorsal Lt upper arm) noted.  Psychiatric: She has a normal mood and affect.    ED Course  Procedures (including critical care time)  Labs Reviewed - No data to display Dg Ribs Unilateral W/chest Left  04/18/2011  *RADIOLOGY REPORT*  Clinical Data: Status post multiple falls and past few weeks; left lower rib pain.  LEFT RIBS AND CHEST - 3+ VIEW  Comparison: Chest radiograph performed 03/13/2011  Findings: The lungs are well-aerated.  A small right pleural effusion is noted, with right basilar airspace opacity.  Mild left basilar atelectasis is noted.  There is no evidence of pneumothorax.  The cardiomediastinal  silhouette is borderline normal in size; calcification is noted within the aortic arch.  No acute osseous abnormalities are seen.  Postoperative change is seen at the left upper quadrant.  IMPRESSION:  1.  No definite displaced rib fractures seen. 2.  Small right pleural effusion, with mild right basilar airspace opacity; minimal left basilar atelectasis noted.  Original Report Authenticated By: Tonia Ghent, M.D.   Dg Humerus Left  04/18/2011  *RADIOLOGY REPORT*  Clinical Data: The multiple falls.  Left arm pain.  LEFT HUMERUS - 2+ VIEW  Comparison: None.  Findings: The elbow and shoulder joints are located.  No acute bone or soft tissue abnormality is evident.  IMPRESSION: Negative left humerus.  Original Report Authenticated By: Jamesetta Orleans. MATTERN, M.D.     1. Pleural effusion   2. Contusion of upper arm, left       MDM  Xrays reviewed by myself and radiologist. Pt transferred to [facility].        Melody Comas, [place] 04/18/11 2153

## 2011-04-18 NOTE — ED Notes (Signed)
[name] RN from [facility] called and said pt. is coming within the hour with orders from Dr. Beverely Low. He states she came here yesterday and waited for 7 hrs and nothing was done except VS. I accessed pt.'s chart and corrected him. I said pt. did not come here she was in the ED, this it the [facility]. He said the orders were in the system. Pt. fell last week and again yesterday and c/o chest, shoulder and R foot pain. He said her pain is not being controlled with Tramadol. I told her our doctors would order the xrays they feel are necessary. We don't take outside orders. If Dr. Beverely Low wants his own orders pt. would have to get them done as an outpatient.  I asked why they are sending the pt. here. He said Dr. Beverely Low was not satisfied with the care pt. got yesterday in the ED. I explained that pt. may end up being transferred to the ED, if it is something complicated. Vassie Moselle 04/18/2011

## 2011-04-18 NOTE — ED Notes (Signed)
Pt complains of rib that has been going on for 2-3 days. She has chronic foot pain, but her home health nurse thinks she stumped it when she fell 3 days ago. They want an x-ray for the chest and foot.

## 2011-04-19 ENCOUNTER — Other Ambulatory Visit: Payer: Self-pay

## 2011-04-19 ENCOUNTER — Emergency Department (HOSPITAL_COMMUNITY): Payer: Medicare Other

## 2011-04-19 ENCOUNTER — Telehealth: Payer: Self-pay | Admitting: *Deleted

## 2011-04-19 DIAGNOSIS — J90 Pleural effusion, not elsewhere classified: Secondary | ICD-10-CM | POA: Diagnosis not present

## 2011-04-19 DIAGNOSIS — I712 Thoracic aortic aneurysm, without rupture: Secondary | ICD-10-CM | POA: Diagnosis not present

## 2011-04-19 DIAGNOSIS — J189 Pneumonia, unspecified organism: Secondary | ICD-10-CM | POA: Diagnosis not present

## 2011-04-19 LAB — BASIC METABOLIC PANEL
Calcium: 9.2 mg/dL (ref 8.4–10.5)
Chloride: 101 mEq/L (ref 96–112)
Creatinine, Ser: 0.82 mg/dL (ref 0.50–1.10)
GFR calc Af Amer: 71 mL/min — ABNORMAL LOW (ref >90)
Sodium: 136 mEq/L (ref 135–145)

## 2011-04-19 LAB — TROPONIN I: Troponin I: <0.3 ng/mL (ref <0.30)

## 2011-04-19 MED ORDER — IOHEXOL 350 MG/ML SOLN
100.0000 mL | Freq: Once | INTRAVENOUS | Status: AC | PRN
  Administered 2011-04-19: 100 mL via INTRAVENOUS

## 2011-04-19 MED ORDER — MOXIFLOXACIN HCL 400 MG PO TABS: 400.0000 mg | ORAL_TABLET | Freq: Every day | ORAL | Status: AC

## 2011-04-19 MED ORDER — MOXIFLOXACIN HCL 400 MG PO TABS
400.0000 mg | ORAL_TABLET | Freq: Once | ORAL | Status: AC
  Administered 2011-04-19: 400 mg via ORAL
  Filled 2011-04-19: qty 1

## 2011-04-19 NOTE — ED Notes (Signed)
Attempted IV x 1 in R AC unsuccessful

## 2011-04-19 NOTE — ED Provider Notes (Signed)
History     CSN: [id number]  Arrival date & time 04/18/11  2212   First MD Initiated Contact with Patient 04/19/11 0140      Chief Complaint  Patient presents with  . Fall    HPI The patient has history of having a stroke back in December. Since that time she has been living with family members. Recently she has had 2 falls at home. 2 days ago the patient was walking to the kitchen and she fell landing on a rocking chair. She had been having pain in her upper arm as well as her chest since the fall. Patient states she went to an urgent care this evening where they did an x-ray that showed a small pleural effusion. Patient was sent down to the emergency room for further evaluation.  Patient states the pain is mild to moderate. It does increase when she moves around. She has had some chronic leg swelling but has not noticed any recent changes. She does note that she has been coughing recently. Past Medical History  Diagnosis Date  . Arthritis   . Diabetes mellitus   . Hypertension   . Heart murmur   . Chicken pox   . Stroke     Past Surgical History  Procedure Date  . Abdominal hysterectomy     Family History  Problem Relation Age of Onset  . Arthritis Mother   . Diabetes Sister   . Heart disease Brother   . Diabetes Brother     History  Substance Use Topics  . Smoking status: Never Smoker   . Smokeless tobacco: Not on file  . Alcohol Use: No    OB History    Grav Para Term Preterm Abortions TAB SAB Ect Mult Living                  Review of Systems  All other systems reviewed and are negative.    Allergies  Betadine and Neosporin  Home Medications   Current Outpatient Rx  Name Route Sig Dispense Refill  . CARVEDILOL 25 MG PO TABS Oral Take 25 mg by mouth 2 (two) times daily with a meal.    . CLOPIDOGREL BISULFATE 75 MG PO TABS Oral Take 75 mg by mouth daily with breakfast.    . FUROSEMIDE 20 MG PO TABS Oral Take 20 mg by mouth daily.    Marland Kitchen GLIMEPIRIDE 2  MG PO TABS Oral Take 2 mg by mouth daily with breakfast.    . SIMVASTATIN 20 MG PO TABS Oral Take 20 mg by mouth daily at 6 PM.    . TRAMADOL HCL 50 MG PO TABS Oral Take 50 mg by mouth every 8 (eight) hours as needed.      BP 147/92  Pulse 94  Temp(Src) 97.5 F (36.4 C) (Oral)  Resp 14  SpO2 97%  Physical Exam  Nursing note and vitals reviewed. Constitutional: She appears well-developed and well-nourished. No distress.  HENT:  Head: Normocephalic and atraumatic.  Right Ear: External ear normal.  Left Ear: External ear normal.  Eyes: Conjunctivae are normal. Right eye exhibits no discharge. Left eye exhibits no discharge. No scleral icterus.  Neck: Neck supple. No tracheal deviation present.  Cardiovascular: Normal rate, regular rhythm and intact distal pulses.   Pulmonary/Chest: Effort normal and breath sounds normal. No stridor. No respiratory distress. She has no wheezes. She has no rales. She exhibits tenderness.  Abdominal: Soft. Bowel sounds are normal. She exhibits no distension. There is  no tenderness. There is no rebound and no guarding.  Musculoskeletal: She exhibits edema. She exhibits no tenderness.  Neurological: She is alert. She has normal strength. No sensory deficit. Cranial nerve deficit:  no gross defecits noted. She exhibits normal muscle tone. She displays no seizure activity. Coordination normal.  Skin: Skin is warm and dry. No rash noted.  Psychiatric: She has a normal mood and affect.    ED Course  Procedures (including critical care time)  Date: 04/19/2011  Rate: 102  Rhythm: atrial fibrillation  QRS Axis: normal  Intervals: irregular  ST/T Wave abnormalities: normal  Conduction Disutrbances:incomplete LBBB  Narrative Interpretation:   Old EKG Reviewed: unchanged   Labs Reviewed  CBC - Abnormal; Notable for the following:    WBC 14.3 (*)    HCT 35.6 (*)    All other components within normal limits  DIFFERENTIAL - Abnormal; Notable for the  following:    Neutro Abs 10.2 (*)    Monocytes Absolute 1.5 (*)    All other components within normal limits  BASIC METABOLIC PANEL - Abnormal; Notable for the following:    Glucose, Bld 120 (*)    GFR calc non Af Amer 62 (*)    GFR calc Af Amer 71 (*)    All other components within normal limits   Dg Ribs Unilateral W/chest Left  04/18/2011  *RADIOLOGY REPORT*  Clinical Data: Status post multiple falls and past few weeks; left lower rib pain.  LEFT RIBS AND CHEST - 3+ VIEW  Comparison: Chest radiograph performed 03/13/2011  Findings: The lungs are well-aerated.  A small right pleural effusion is noted, with right basilar airspace opacity.  Mild left basilar atelectasis is noted.  There is no evidence of pneumothorax.  The cardiomediastinal silhouette is borderline normal in size; calcification is noted within the aortic arch.  No acute osseous abnormalities are seen.  Postoperative change is seen at the left upper quadrant.  IMPRESSION:  1.  No definite displaced rib fractures seen. 2.  Small right pleural effusion, with mild right basilar airspace opacity; minimal left basilar atelectasis noted.  Original Report Authenticated By: Tonia Ghent, M.D.   Ct Angio Chest W/cm &/or Wo Cm  04/19/2011  *RADIOLOGY REPORT*  Clinical Data: Chest pain and shortness of breath; pleural effusion noted on chest radiograph.  CT ANGIOGRAPHY CHEST  Technique:  Multidetector CT imaging of the chest using the standard protocol during bolus administration of intravenous contrast. Multiplanar reconstructed images including MIPs were obtained and reviewed to evaluate the vascular anatomy.  Contrast: OMNIPAQUE IOHEXOL 350 MG/ML IV SOLN  Comparison: Chest radiograph performed 04/18/2011  Findings: There is no evidence of significant pulmonary embolus, though evaluation for pulmonary embolus is suboptimal in areas of airspace consolidation.  Minimal apparent defect within a left upper lobe pulmonary artery is thought to  reflect motion artifact.  There is dense airspace consolidation within the right lower lobe, compatible with pneumonia.  An associated small loculated right pleural effusion is noted. There is mild diffuse prominence of the interstitium, raising question for mild underlying interstitial edema.  Mild bilateral atelectasis is noted.  There is no evidence of pneumothorax.  No masses are identified; no abnormal focal contrast enhancement is seen.  There is aneurysmal dilatation of the ascending thoracic aorta to 4.3 cm in AP dimension.  Diffuse coronary artery calcifications are seen.  No pericardial effusion is identified.  Scattered prominent right paratracheal and aortopulmonary window nodes are seen, measuring up to 1.3 cm in short  axis.  No pericardial effusion is identified.  The great vessels are grossly unremarkable in appearance.  No axillary lymphadenopathy is seen.  The visualized portions of the thyroid gland are unremarkable in appearance.  The visualized portions of the liver and spleen are unremarkable.  No acute osseous abnormalities are seen.  IMPRESSION:  1.  No evidence of significant pulmonary embolus. 2.  Dense right lower lobe pneumonia noted, with an associated small loculated right-sided pleural effusion. 3.  Mild diffuse prominence of the interstitium raises question for mild underlying interstitial edema. 4.  Aneurysmal dilatation of the ascending thoracic aorta to 4.3 cm in AP dimension. 5.  Diffuse coronary artery calcifications seen.  6.  Scattered prominent right paratracheal and aortopulmonary window nodes seen, measuring up to 1.3 cm in short axis.  Original Report Authenticated By: Tonia Ghent, M.D.   Dg Humerus Left  04/18/2011  *RADIOLOGY REPORT*  Clinical Data: The multiple falls.  Left arm pain.  LEFT HUMERUS - 2+ VIEW  Comparison: None.  Findings: The elbow and shoulder joints are located.  No acute bone or soft tissue abnormality is evident.  IMPRESSION: Negative left humerus.   Original Report Authenticated By: Jamesetta Orleans. MATTERN, M.D.     MDM  The patient's CT scan suggesting pneumonia with a possible loculated effusion.  This is concerning for empyema although clinically the patient is only having mild symptoms.  Patient was surprised that she has not really been having any trouble with cough or fever. I discussed the findings with the patient and her family and recommended admission to the hospital for further treatment.  The patient however was adamant that she did not want to be admitted to the hospital. She states she's not been feeling that bad. I explained to the patient that her symptoms could get worse and this infection could become more severe. She understands this and still wants to go home. I will prescribe her a course of antibiotics. She is instructed to followup with her doctor next week and to return to the hospital immediately for any worsening symptoms.        Celene Kras, MD 04/19/11 [phone]

## 2011-04-19 NOTE — Discharge Instructions (Signed)
Pneumonia, Adult Pneumonia is an infection of the lungs.  CAUSES Pneumonia may be caused by bacteria or a virus. Usually, these infections are caused by breathing infectious particles into the lungs (respiratory tract). SYMPTOMS   Cough.   Fever.   Chest pain.   Increased rate of breathing.   Wheezing.   Mucus production.  DIAGNOSIS  If you have the common symptoms of pneumonia, your caregiver will typically confirm the diagnosis with a chest X-ray. The X-ray will show an abnormality in the lung (pulmonary infiltrate) if you have pneumonia. Other tests of your blood, urine, or sputum may be done to find the specific cause of your pneumonia. Your caregiver may also do tests (blood gases or pulse oximetry) to see how well your lungs are working. TREATMENT  Some forms of pneumonia may be spread to other people when you cough or sneeze. You may be asked to wear a mask before and during your exam. Pneumonia that is caused by bacteria is treated with antibiotic medicine. Pneumonia that is caused by the influenza virus may be treated with an antiviral medicine. Most other viral infections must run their course. These infections will not respond to antibiotics.  PREVENTION A pneumococcal shot (vaccine) is available to prevent a common bacterial cause of pneumonia. This is usually suggested for:  People over 65 years old.   Patients on chemotherapy.   People with chronic lung problems, such as bronchitis or emphysema.   People with immune system problems.  If you are over 65 or have a high risk condition, you may receive the pneumococcal vaccine if you have not received it before. In some countries, a routine influenza vaccine is also recommended. This vaccine can help prevent some cases of pneumonia.You may be offered the influenza vaccine as part of your care. If you smoke, it is time to quit. You may receive instructions on how to stop smoking. Your caregiver can provide medicines and  counseling to help you quit. HOME CARE INSTRUCTIONS   Cough suppressants may be used if you are losing too much rest. However, coughing protects you by clearing your lungs. You should avoid using cough suppressants if you can.   Your caregiver may have prescribed medicine if he or she thinks your pneumonia is caused by a bacteria or influenza. Finish your medicine even if you start to feel better.   Your caregiver may also prescribe an expectorant. This loosens the mucus to be coughed up.   Only take over-the-counter or prescription medicines for pain, discomfort, or fever as directed by your caregiver.   Do not smoke. Smoking is a common cause of bronchitis and can contribute to pneumonia. If you are a smoker and continue to smoke, your cough may last several weeks after your pneumonia has cleared.   A cold steam vaporizer or humidifier in your room or home may help loosen mucus.   Coughing is often worse at night. Sleeping in a semi-upright position in a recliner or using a couple pillows under your head will help with this.   Get rest as you feel it is needed. Your body will usually let you know when you need to rest.  SEEK IMMEDIATE MEDICAL CARE IF:   Your illness becomes worse. This is especially true if you are elderly or weakened from any other disease.   You cannot control your cough with suppressants and are losing sleep.   You begin coughing up blood.   You develop pain which is getting worse or   is uncontrolled with medicines.   You have a fever.   Any of the symptoms which initially brought you in for treatment are getting worse rather than better.   You develop shortness of breath or chest pain.  MAKE SURE YOU:   Understand these instructions.   Will watch your condition.   Will get help right away if you are not doing well or get worse.  Document Released: 01/14/2005 Document Revised: 01/03/2011 Document Reviewed: 04/05/2010 [facility] Patient Information 2012  [place], [place].Pleural Effusion You have a pleural effusion. This means you have fluid in the space between your lung and your chest wall. This condition may result from many different problems including:  Pleurisy or inflammation of the lining of the lung.   Lung infections such as pneumonia.   Blood clots, heart disease, chest injuries, cancer, and other medical problems.  A pleural effusion may not cause any symptoms. If it is large, however, it can make you short of breath. Other symptoms include cough, chest pain, or fever. The diagnosis of a pleural effusion is most often made by chest X-ray, CT, or ultrasound studies. A sample of the fluid can be taken using local anesthetic and a needle or catheter placed between the ribs. This procedure may be needed to determine the cause of the effusion, or to treat a large effusion.  Treatment is specific to the cause, such as antibiotics for infections. Mild pain or anti-inflammatory medicines may be helpful in relieving symptoms. Chronic effusions often have to be periodically drained. It is very important that you see your doctor or a specialist to be sure the effusion is not from a serious medical condition. A follow-up chest X-ray is usually needed.  SEEK IMMEDIATE MEDICAL CARE IF:  You develop shortness of breath.   You develop increased pain.   You develop a high fever.  Document Released: 02/22/2004 Document Revised: 01/03/2011 Document Reviewed: 01/14/2005 [facility] Patient Information 2012 [place], [place].

## 2011-04-19 NOTE — ED Notes (Signed)
Attempted IV x 2 nurses.  IV team called

## 2011-04-19 NOTE — Telephone Encounter (Signed)
Noted pt was seen in UC and transported to [facility] for evaluation, called [name] from [place] to get an update per unable to understand the pt upon calling residence, [name] advised that he also spoke with the pt and she did have slurred speech as well, possibly from pain medication given, [name] advised that he is going out to the pt home today to evaluate her progress, [name] will call the office back with an update, noted that when pt went to the ED for her x-ray she was actually sitting in the ED department instead of Radiology and when her name was called in the radiology department to receive the x-ray she was noted as not present.

## 2011-04-19 NOTE — ED Provider Notes (Signed)
Medical screening examination/treatment/procedure(s) were performed by resident physician or non-physician practitioner and as supervising physician I was immediately available for consultation/collaboration.   Barkley Bruns MD.    Linna Hoff, MD 04/19/11 [phone]

## 2011-04-19 NOTE — ED Notes (Signed)
Rx x 1, pt voiced understanding to f/u with PCP. 

## 2011-04-19 NOTE — ED Notes (Signed)
Patient transported to CT 

## 2011-04-19 NOTE — ED Notes (Signed)
Pt states she fell 2 weeks ago while at home recovering from stroke with right sided deficits.  Went to urgent care today and had CXR that showed right sided pleural effusion, pt c/o some SOB, 99% on RA.  Lung sounds diminished bilaterally.

## 2011-04-19 NOTE — Telephone Encounter (Signed)
Andy aware

## 2011-04-21 NOTE — Assessment & Plan Note (Signed)
New to provider.  Pt recently had A1C checked.  Too early to repeat.  Unable to exercise.  Encouraged healthy diet.  Will follow closely.

## 2011-04-21 NOTE — Assessment & Plan Note (Addendum)
New.  Family reports pt is having home health PT but tries to ambulate w/out assistance.  Had xrays at [facility] that were negative for fx but pt continues to have rib pain and extensive bruising.  Cognitive changes and somnolence w/ T3- try tramadol.  Pt is not in good health and family seems unrealistic about their expectations.  They want pt to remain a full code and ask why she's on meds if they're not going to keep her alive.  Tried to outline pt's very serious situation- almost 78 yrs old w/ serious medical comorbidities and now limited in ability to perform ADLs independently.

## 2011-04-21 NOTE — Assessment & Plan Note (Signed)
Chronic problem.  New to provider.  Excellent control.  Tolerating BP meds.  Will follow closely.

## 2011-04-21 NOTE — Assessment & Plan Note (Signed)
New to provider.  Pt w/ R hand deficits.  Following w/ neuro.  On plavix.  Apparently receiving home health PT.  Uncertain as to pt's recent care b/c family did not bring medical records and only have hospital records for review.  Pt is not in a good social situation- family w/ unrealistic expectations, was living alone, they are uncertain as to her meds and medical care.  They desire her to be a full code, want to know when she'll be 'back to normal'.  Want to know if she'll have another stroke.  Tell them that this is likely- son becomes upset saying, 'i don't like that answer'.  Will help pt get set up w/ necessary resources and try to help family through this difficult transition.  Will follow closely.

## 2011-04-21 NOTE — Assessment & Plan Note (Signed)
Pt on statin.  Tolerating w/out difficulty.  Recently had fasting lipid panel.  Too early to repeat labs.  Will follow.

## 2011-04-21 NOTE — Assessment & Plan Note (Signed)
New.  Pt w/ home health.  Recommend wound care- possibly UNA boots to improve swelling.  Check BMP- if normal, start Lasix.  Family expressed understanding and is in agreement.

## 2011-04-22 ENCOUNTER — Emergency Department (HOSPITAL_COMMUNITY): Payer: Medicare Other

## 2011-04-22 ENCOUNTER — Emergency Department (HOSPITAL_COMMUNITY)
Admission: EM | Admit: 2011-04-22 | Discharge: 2011-04-22 | Disposition: A | Discharge status: Home / Self Care | Payer: Medicare Other | Attending: Emergency Medicine | Admitting: Emergency Medicine

## 2011-04-22 ENCOUNTER — Other Ambulatory Visit: Payer: Self-pay

## 2011-04-22 DIAGNOSIS — Z8673 Personal history of transient ischemic attack (TIA), and cerebral infarction without residual deficits: Secondary | ICD-10-CM | POA: Insufficient documentation

## 2011-04-22 DIAGNOSIS — M129 Arthropathy, unspecified: Secondary | ICD-10-CM | POA: Insufficient documentation

## 2011-04-22 DIAGNOSIS — E119 Type 2 diabetes mellitus without complications: Secondary | ICD-10-CM | POA: Diagnosis not present

## 2011-04-22 DIAGNOSIS — J189 Pneumonia, unspecified organism: Secondary | ICD-10-CM

## 2011-04-22 DIAGNOSIS — J9819 Other pulmonary collapse: Secondary | ICD-10-CM | POA: Diagnosis not present

## 2011-04-22 DIAGNOSIS — Z79899 Other long term (current) drug therapy: Secondary | ICD-10-CM | POA: Diagnosis not present

## 2011-04-22 DIAGNOSIS — R918 Other nonspecific abnormal finding of lung field: Secondary | ICD-10-CM | POA: Diagnosis not present

## 2011-04-22 DIAGNOSIS — I69959 Hemiplegia and hemiparesis following unspecified cerebrovascular disease affecting unspecified side: Secondary | ICD-10-CM | POA: Diagnosis not present

## 2011-04-22 DIAGNOSIS — C752 Malignant neoplasm of craniopharyngeal duct: Secondary | ICD-10-CM | POA: Diagnosis not present

## 2011-04-22 DIAGNOSIS — R011 Cardiac murmur, unspecified: Secondary | ICD-10-CM | POA: Insufficient documentation

## 2011-04-22 DIAGNOSIS — I10 Essential (primary) hypertension: Secondary | ICD-10-CM | POA: Insufficient documentation

## 2011-04-22 DIAGNOSIS — J8410 Pulmonary fibrosis, unspecified: Secondary | ICD-10-CM | POA: Diagnosis not present

## 2011-04-22 DIAGNOSIS — I69923 Fluency disorder following unspecified cerebrovascular disease: Secondary | ICD-10-CM | POA: Diagnosis not present

## 2011-04-22 DIAGNOSIS — R0602 Shortness of breath: Secondary | ICD-10-CM | POA: Diagnosis not present

## 2011-04-22 LAB — COMPREHENSIVE METABOLIC PANEL
ALT: 12 U/L (ref 0–35)
AST: 17 U/L (ref 0–37)
Albumin: 2.1 g/dL — ABNORMAL LOW (ref 3.5–5.2)
Alkaline Phosphatase: 119 U/L — ABNORMAL HIGH (ref 39–117)
BUN: 16 mg/dL (ref 6–23)
CO2: 25 mEq/L (ref 19–32)
Calcium: 8.2 mg/dL — ABNORMAL LOW (ref 8.4–10.5)
Chloride: 102 mEq/L (ref 96–112)
Creatinine, Ser: 0.94 mg/dL (ref 0.50–1.10)
GFR calc Af Amer: 61 mL/min — ABNORMAL LOW (ref >90)
GFR calc non Af Amer: 52 mL/min — ABNORMAL LOW (ref >90)
Glucose, Bld: 92 mg/dL (ref 70–99)
Potassium: 4.2 mEq/L (ref 3.5–5.1)
Sodium: 135 mEq/L (ref 135–145)
Total Bilirubin: 0.5 mg/dL (ref 0.3–1.2)
Total Protein: 6 g/dL (ref 6.0–8.3)

## 2011-04-22 LAB — URINALYSIS, ROUTINE W REFLEX MICROSCOPIC
Bilirubin Urine: NEGATIVE
Glucose, UA: NEGATIVE mg/dL
Ketones, ur: NEGATIVE mg/dL
Nitrite: NEGATIVE
Protein, ur: NEGATIVE mg/dL
Specific Gravity, Urine: 1.015 (ref 1.005–1.030)
Urobilinogen, UA: 1 mg/dL (ref 0.0–1.0)
pH: 7 (ref 5.0–8.0)

## 2011-04-22 LAB — CBC
HCT: 30.9 % — ABNORMAL LOW (ref 36.0–46.0)
Hemoglobin: 10.5 g/dL — ABNORMAL LOW (ref 12.0–15.0)
MCH: 31.5 pg (ref 26.0–34.0)
MCHC: 34 g/dL (ref 30.0–36.0)
MCV: 92.8 fL (ref 78.0–100.0)
Platelets: 271 10*3/uL (ref 150–400)
RBC: 3.33 MIL/uL — ABNORMAL LOW (ref 3.87–5.11)
RDW: 13.6 % (ref 11.5–15.5)
WBC: 11.5 10*3/uL — ABNORMAL HIGH (ref 4.0–10.5)

## 2011-04-22 LAB — DIFFERENTIAL
Basophils Absolute: 0 10*3/uL (ref 0.0–0.1)
Basophils Relative: 0 % (ref 0–1)
Eosinophils Absolute: 0.2 10*3/uL (ref 0.0–0.7)
Eosinophils Relative: 2 % (ref 0–5)
Lymphocytes Relative: 15 % (ref 12–46)
Lymphs Abs: 1.7 10*3/uL (ref 0.7–4.0)
Monocytes Absolute: 1.6 10*3/uL — ABNORMAL HIGH (ref 0.1–1.0)
Monocytes Relative: 14 % — ABNORMAL HIGH (ref 3–12)
Neutro Abs: 7.7 10*3/uL (ref 1.7–7.7)
Neutrophils Relative %: 69 % (ref 43–77)

## 2011-04-22 LAB — URINE MICROSCOPIC-ADD ON

## 2011-04-22 LAB — TROPONIN I: Troponin I: <0.3 ng/mL (ref <0.30)

## 2011-04-22 MED ORDER — TRAMADOL HCL 50 MG PO TABS: 50.0000 mg | ORAL_TABLET | Freq: Three times a day (TID) | ORAL | Status: DC | PRN

## 2011-04-22 NOTE — Discharge Instructions (Signed)
We recommended, that you are admitted to the hospital today since since you decided to go home, you will need to follow up with your primary care doctor soon as possible.  You will need to return to the emergency department for any worsening in your condition.

## 2011-04-22 NOTE — ED Notes (Signed)
Family called out shortness of breath, patient fell a couple weeks ago and was seen here and left AMA on 3/21 dx with pneumonia on home abx and not improving

## 2011-04-22 NOTE — ED Notes (Signed)
Patient reports being dx with pneumonia and taking avelox at home has some generalized weakness and shortness of breath

## 2011-04-22 NOTE — ED Provider Notes (Signed)
History     CSN: [id number]  Arrival date & time 04/22/11  0453   First MD Initiated Contact with Patient 04/22/11 [phone]      Chief Complaint  Patient presents with  . Shortness of Breath    (Consider location/radiation/quality/duration/timing/severity/associated sxs/prior treatment) HPI The patient presents the emergency department with shortness of breath.  He states that this started early this morning.  Says she was recently seen in the emergency department and diagnosed with pneumonia and given Avelox.  She states, that she has been taking the Avelox as directed, and she feels, like she has gotten some relief.  Patient states that she really does not want to be admitted to the hospital here today.  She states that she does not have any chest pain, nausea/vomiting, abdominal pain, headache, visual changes, neck pain, or fever. Past Medical History  Diagnosis Date  . Arthritis   . Diabetes mellitus   . Hypertension   . Heart murmur   . Chicken pox   . Stroke     Past Surgical History  Procedure Date  . Abdominal hysterectomy     Family History  Problem Relation Age of Onset  . Arthritis Mother   . Diabetes Sister   . Heart disease Brother   . Diabetes Brother     History  Substance Use Topics  . Smoking status: Never Smoker   . Smokeless tobacco: Not on file  . Alcohol Use: No    OB History    Grav Para Term Preterm Abortions TAB SAB Ect Mult Living                  Review of Systems All pertinent positives and negatives reviewed in the history of present illness  Allergies  Betadine; Codeine; Neosporin; and Pedialyte  Home Medications   Current Outpatient Rx  Name Route Sig Dispense Refill  . CARVEDILOL 25 MG PO TABS Oral Take 25 mg by mouth 2 (two) times daily with a meal.    . CLOPIDOGREL BISULFATE 75 MG PO TABS Oral Take 75 mg by mouth daily with breakfast.    . FUROSEMIDE 20 MG PO TABS Oral Take 20 mg by mouth daily.    Marland Kitchen GLIMEPIRIDE 2 MG PO  TABS Oral Take 2 mg by mouth daily with breakfast.    . MOXIFLOXACIN HCL 400 MG PO TABS Oral Take 1 tablet (400 mg total) by mouth daily. 10 tablet 0  . OVER THE COUNTER MEDICATION  OTC medication Patient said it's similar to Tylenol, but not Tylenol.    Marland Kitchen SIMVASTATIN 20 MG PO TABS Oral Take 20 mg by mouth daily at 6 PM.    . TRAMADOL HCL 50 MG PO TABS Oral Take 50 mg by mouth every 8 (eight) hours as needed.      BP 159/79  Pulse 65  Temp(Src) 98.9 F (37.2 C) (Oral)  Resp 16  SpO2 96%  Physical Exam  Constitutional: She is oriented to person, place, and time. She appears well-developed and well-nourished. No distress.  HENT:  Head: Normocephalic and atraumatic.  Mouth/Throat: Oropharynx is clear and moist. No oropharyngeal exudate.  Eyes: Pupils are equal, round, and reactive to light.  Cardiovascular: Normal rate and regular rhythm.   Murmur heard. Pulmonary/Chest: Effort normal and breath sounds normal. No respiratory distress. She has no wheezes. She has no rales.  Abdominal: Soft. Bowel sounds are normal. She exhibits no distension. There is no tenderness. There is no rebound.  Neurological: She is  alert and oriented to person, place, and time.  Skin: Skin is warm and dry. No rash noted.    ED Course  Procedures (including critical care time)  Labs Reviewed  COMPREHENSIVE METABOLIC PANEL - Abnormal; Notable for the following:    Calcium 8.2 (*)    Albumin 2.1 (*)    Alkaline Phosphatase 119 (*)    GFR calc non Af Amer 52 (*)    GFR calc Af Amer 61 (*)    All other components within normal limits  CBC - Abnormal; Notable for the following:    WBC 11.5 (*)    RBC 3.33 (*)    Hemoglobin 10.5 (*)    HCT 30.9 (*)    All other components within normal limits  URINALYSIS, ROUTINE W REFLEX MICROSCOPIC - Abnormal; Notable for the following:    APPearance CLOUDY (*)    Hgb urine dipstick TRACE (*)    Leukocytes, UA MODERATE (*)    All other components within normal limits   DIFFERENTIAL - Abnormal; Notable for the following:    Monocytes Relative 14 (*)    Monocytes Absolute 1.6 (*)    All other components within normal limits  URINE MICROSCOPIC-ADD ON - Abnormal; Notable for the following:    Squamous Epithelial / LPF MANY (*)    Bacteria, UA FEW (*)    Casts GRANULAR CAST (*)    All other components within normal limits  TROPONIN I  TROPONIN I   Dg Chest 2 View  04/22/2011  *RADIOLOGY REPORT*  Clinical Data: Shortness of breath and weakness.  CHEST - 2 VIEW  Comparison: 04/18/2011  Findings: The cardiopericardial silhouette is enlarged. Interstitial markings are diffusely coarsened with chronic features.  Bibasilar atelectasis/infiltrate, right greater than left, again noted. Bones are diffusely demineralized.  IMPRESSION: Cardiomegaly with underlying chronic interstitial lung disease.  Bibasilar atelectasis/infiltrate, right greater than left.  Original Report Authenticated By: ERIC A. MANSELL, M.D.      The patient is advised that she most likely needs admission for further workup and evaluation of this issue.  She states she does not want to be admitted would like to go home.  She states, that she will come back if anything gets worse or changes.  Her vital signs during emergency department stay.  She was 97% on room air while is in giving her option of admission. I explained the risk of leaving to the patient and her family. This included but not limited to worsening in her condition and death.   MDM  MDM Reviewed: nursing note and vitals Reviewed previous: labs, ECG and x-ray Interpretation: labs, ECG and x-ray    Date: 04/22/2011  Rate: 65  Rhythm: normal sinus rhythm  QRS Axis: normal  Intervals: normal  ST/T Wave abnormalities: normal  Conduction Disutrbances:incomplete left bundle  Narrative Interpretation:   Old EKG Reviewed: unchanged          Carlyle Dolly, PA-C 04/22/11 1637  Carlyle Dolly, PA-C 04/22/11  [phone]

## 2011-04-22 NOTE — ED Notes (Signed)
PA in to speak with pt about being admitted. Pt stating "I do not want to stay in the hospital, I am going home". Attempt to explain importance of pt staying the hospital for treatment/observation. Pt no open to the idea. Family reporting "She makes her own decisions". PA and RN in to explain benefits/risks of pt being hospitalized vs going home.

## 2011-04-22 NOTE — ED Notes (Signed)
Pt provided coke along with family members

## 2011-04-23 ENCOUNTER — Encounter: Payer: Self-pay | Admitting: Physical Medicine & Rehabilitation

## 2011-04-23 ENCOUNTER — Telehealth: Payer: Self-pay | Admitting: *Deleted

## 2011-04-23 NOTE — Telephone Encounter (Signed)
Called Marcelino Duster for [facility] to advise pt need for assistance/evaluation for assistance per notes stroke/current sxs for PNA with refusal of admittance to hospital after several ED visits. Rep will contact pt about any assistance that can be helpful to her/family

## 2011-04-24 DIAGNOSIS — I10 Essential (primary) hypertension: Secondary | ICD-10-CM | POA: Diagnosis not present

## 2011-04-24 DIAGNOSIS — I69959 Hemiplegia and hemiparesis following unspecified cerebrovascular disease affecting unspecified side: Secondary | ICD-10-CM | POA: Diagnosis not present

## 2011-04-24 DIAGNOSIS — E119 Type 2 diabetes mellitus without complications: Secondary | ICD-10-CM | POA: Diagnosis not present

## 2011-04-24 DIAGNOSIS — C752 Malignant neoplasm of craniopharyngeal duct: Secondary | ICD-10-CM | POA: Diagnosis not present

## 2011-04-24 DIAGNOSIS — I69923 Fluency disorder following unspecified cerebrovascular disease: Secondary | ICD-10-CM | POA: Diagnosis not present

## 2011-04-25 ENCOUNTER — Encounter: Payer: Self-pay | Admitting: Family Medicine

## 2011-04-25 ENCOUNTER — Ambulatory Visit (INDEPENDENT_AMBULATORY_CARE_PROVIDER_SITE_OTHER): Payer: Medicare Other | Admitting: Family Medicine

## 2011-04-25 ENCOUNTER — Telehealth: Payer: Self-pay | Admitting: *Deleted

## 2011-04-25 VITALS — BP 142/82 | HR 68 | Temp 98.0°F | Ht 63.25 in | Wt 136.2 lb

## 2011-04-25 DIAGNOSIS — I6350 Cerebral infarction due to unspecified occlusion or stenosis of unspecified cerebral artery: Secondary | ICD-10-CM | POA: Diagnosis not present

## 2011-04-25 DIAGNOSIS — I712 Thoracic aortic aneurysm, without rupture: Secondary | ICD-10-CM | POA: Insufficient documentation

## 2011-04-25 DIAGNOSIS — I639 Cerebral infarction, unspecified: Secondary | ICD-10-CM

## 2011-04-25 DIAGNOSIS — J690 Pneumonitis due to inhalation of food and vomit: Secondary | ICD-10-CM | POA: Diagnosis not present

## 2011-04-25 MED ORDER — TRAMADOL HCL 50 MG PO TABS: 50.0000 mg | ORAL_TABLET | Freq: Three times a day (TID) | ORAL | Status: AC | PRN

## 2011-04-25 NOTE — Telephone Encounter (Signed)
Called Dr Allyson Sabal with [facility] spoke to [place] to ask what Dr Allyson Sabal wants to do about the recent discovery of a 4.3cm ascending thoracic aortic aneurysm that was noted on CAT Scan on 04-19-11 during ED visit, does he want to send pt to [facility] or does he want to handle this concern? Was advised by Martie Lee that she will call our office back with DR Allyson Sabal answer, verified our contact number, MD Tabori aware verbally

## 2011-04-25 NOTE — Patient Instructions (Signed)
We will make sure vascular is aware of your aneurysm Finish the Avelox for the pneumonia Someone will call you with your speech evaluation Use the Tramadol only as needed for pain Call with any questions or concerns Hang in there!

## 2011-04-25 NOTE — ED Provider Notes (Signed)
Medical screening examination/treatment/procedure(s) were conducted as a shared visit with non-physician practitioner(s) and myself.  I personally evaluated the patient during the encounter Pt with recently dx pna. On avelox. States cough persists. No cp or sob. No nv. Chest upper resp congestion.   Suzi Roots, MD 04/25/11 [phone]

## 2011-04-25 NOTE — Progress Notes (Signed)
  Subjective:    Patient ID: Christina Pittman, female    DOB: 1919/08/02, 78 y.o.   MRN: [id number]  HPI Aspiration PNA- went to ER 3x and was dx'd w/ severe RLL PNA w/ loculated effusion.  1st visit left prior to being seen.  2nd and 3rd visit refused admission.  Pt reports that she had normal swallow study after CVA.  Is on Avelox.  Feels breathing has improved.  Denies fever.  Daughter reports minimal coughing/SOB/wheezing.  Was found on CT scan to have 4 cm ascending thoracic aortic aneurysm.  Pt and daughter were unaware of this.  Has cards f/u next week.   Review of Systems For ROS see HPI     Objective:   Physical Exam  Vitals reviewed. Constitutional: She appears well-developed and well-nourished. No distress.       Appears better today than 1st visit  HENT:  Head: Normocephalic and atraumatic.       Facial bruises have healed  Neck: Neck supple.  Cardiovascular: Normal rate, regular rhythm and intact distal pulses.   Pulmonary/Chest: Effort normal and breath sounds normal. No respiratory distress. She has no wheezes. She has no rales.  Musculoskeletal: She exhibits edema (LE's bilaterally).  Lymphadenopathy:    She has no cervical adenopathy.  Skin: Skin is warm and dry.  Psychiatric: She has a normal mood and affect. Her behavior is normal.          Assessment & Plan:

## 2011-04-26 DIAGNOSIS — E119 Type 2 diabetes mellitus without complications: Secondary | ICD-10-CM | POA: Diagnosis not present

## 2011-04-26 DIAGNOSIS — I69923 Fluency disorder following unspecified cerebrovascular disease: Secondary | ICD-10-CM | POA: Diagnosis not present

## 2011-04-26 DIAGNOSIS — C752 Malignant neoplasm of craniopharyngeal duct: Secondary | ICD-10-CM | POA: Diagnosis not present

## 2011-04-26 DIAGNOSIS — I10 Essential (primary) hypertension: Secondary | ICD-10-CM | POA: Diagnosis not present

## 2011-04-26 DIAGNOSIS — I69959 Hemiplegia and hemiparesis following unspecified cerebrovascular disease affecting unspecified side: Secondary | ICD-10-CM | POA: Diagnosis not present

## 2011-04-28 DIAGNOSIS — I10 Essential (primary) hypertension: Secondary | ICD-10-CM | POA: Diagnosis not present

## 2011-04-28 DIAGNOSIS — E119 Type 2 diabetes mellitus without complications: Secondary | ICD-10-CM | POA: Diagnosis not present

## 2011-04-28 DIAGNOSIS — I69923 Fluency disorder following unspecified cerebrovascular disease: Secondary | ICD-10-CM | POA: Diagnosis not present

## 2011-04-28 DIAGNOSIS — I69959 Hemiplegia and hemiparesis following unspecified cerebrovascular disease affecting unspecified side: Secondary | ICD-10-CM | POA: Diagnosis not present

## 2011-04-28 DIAGNOSIS — C751 Malignant neoplasm of pituitary gland: Secondary | ICD-10-CM | POA: Diagnosis not present

## 2011-04-28 NOTE — Assessment & Plan Note (Signed)
New.  Found coincidentally on imaging done in ER.  Left message w/ cardiology to make MD aware.  If cards prefers to deal w/ this, will defer management to them, otherwise will refer to [facility] for continued surveillance.  Discussed dx w/ pt and daughter- this may have been cause of pt's stroke.

## 2011-04-28 NOTE — Assessment & Plan Note (Signed)
New.  Pt reports she had normal swallowing study after CVA.  Does admit to some difficulty w/ swallowing since returning home.  Was also given pain meds after sustaining fall at home that caused excessive sedation and may have been responsible for pt's aspiration.  Will order swallow study to ensure no modifications need to be made to pt's diet.  Pt and daughter in agreement.

## 2011-04-28 NOTE — Assessment & Plan Note (Signed)
Pt has f/u w/ neuro and cards.  Found to have 4 cm ascending thoracic aortic aneurysm.  Possibly cause of stroke.  Cards made aware.  Will continue to follow.

## 2011-04-29 ENCOUNTER — Telehealth: Payer: Self-pay | Admitting: *Deleted

## 2011-04-29 DIAGNOSIS — C751 Malignant neoplasm of pituitary gland: Secondary | ICD-10-CM | POA: Diagnosis not present

## 2011-04-29 DIAGNOSIS — E119 Type 2 diabetes mellitus without complications: Secondary | ICD-10-CM | POA: Diagnosis not present

## 2011-04-29 DIAGNOSIS — I10 Essential (primary) hypertension: Secondary | ICD-10-CM | POA: Diagnosis not present

## 2011-04-29 DIAGNOSIS — I69923 Fluency disorder following unspecified cerebrovascular disease: Secondary | ICD-10-CM | POA: Diagnosis not present

## 2011-04-29 DIAGNOSIS — I69959 Hemiplegia and hemiparesis following unspecified cerebrovascular disease affecting unspecified side: Secondary | ICD-10-CM | POA: Diagnosis not present

## 2011-04-29 NOTE — Telephone Encounter (Signed)
Natalia Leatherwood (nurse from [facility]) returned my call concerning pt recent anyresum noted during recent MRI, nurse Natalia Leatherwood advised that pt has an apt with MD on 04-30-11 and that they will address this concern on that upcoming visit, MD Beverely Low made aware verbally.

## 2011-04-29 NOTE — Telephone Encounter (Signed)
noted 

## 2011-04-30 ENCOUNTER — Encounter (HOSPITAL_BASED_OUTPATIENT_CLINIC_OR_DEPARTMENT_OTHER): Payer: Medicare Other

## 2011-04-30 DIAGNOSIS — E782 Mixed hyperlipidemia: Secondary | ICD-10-CM | POA: Diagnosis not present

## 2011-04-30 DIAGNOSIS — E119 Type 2 diabetes mellitus without complications: Secondary | ICD-10-CM | POA: Diagnosis not present

## 2011-04-30 DIAGNOSIS — I10 Essential (primary) hypertension: Secondary | ICD-10-CM | POA: Diagnosis not present

## 2011-04-30 DIAGNOSIS — I6789 Other cerebrovascular disease: Secondary | ICD-10-CM | POA: Diagnosis not present

## 2011-05-01 DIAGNOSIS — I69959 Hemiplegia and hemiparesis following unspecified cerebrovascular disease affecting unspecified side: Secondary | ICD-10-CM | POA: Diagnosis not present

## 2011-05-01 DIAGNOSIS — I10 Essential (primary) hypertension: Secondary | ICD-10-CM | POA: Diagnosis not present

## 2011-05-01 DIAGNOSIS — I69923 Fluency disorder following unspecified cerebrovascular disease: Secondary | ICD-10-CM | POA: Diagnosis not present

## 2011-05-01 DIAGNOSIS — C751 Malignant neoplasm of pituitary gland: Secondary | ICD-10-CM | POA: Diagnosis not present

## 2011-05-01 DIAGNOSIS — E119 Type 2 diabetes mellitus without complications: Secondary | ICD-10-CM | POA: Diagnosis not present

## 2011-05-02 ENCOUNTER — Telehealth: Payer: Self-pay | Admitting: *Deleted

## 2011-05-02 NOTE — Telephone Encounter (Signed)
Christina Pittman from [place] called in reference to Tylene Fantasia to report that he needed would care orders and she was having a problem with poor appetite and was wondering about getting some megace possibly to help with appetite issues. Also, she will be fitted for her compression stockings next week and PT should be working with her.  I spoke with Christina Pittman and told him that we had not seen Ms Morace in this office at this point and he would need to [facility] Ar[facility] for the [facility] and probably should call possibly Dr Ivory Broad about the appetite issues. I also told hi that Dr Wynn Banker was out of the office until Monday 05/06/11 as well.

## 2011-05-03 ENCOUNTER — Encounter (HOSPITAL_BASED_OUTPATIENT_CLINIC_OR_DEPARTMENT_OTHER): Payer: Medicare Other | Attending: General Surgery

## 2011-05-03 DIAGNOSIS — I10 Essential (primary) hypertension: Secondary | ICD-10-CM | POA: Insufficient documentation

## 2011-05-03 DIAGNOSIS — Z8673 Personal history of transient ischemic attack (TIA), and cerebral infarction without residual deficits: Secondary | ICD-10-CM | POA: Insufficient documentation

## 2011-05-03 DIAGNOSIS — E119 Type 2 diabetes mellitus without complications: Secondary | ICD-10-CM | POA: Insufficient documentation

## 2011-05-03 DIAGNOSIS — L97509 Non-pressure chronic ulcer of other part of unspecified foot with unspecified severity: Secondary | ICD-10-CM | POA: Insufficient documentation

## 2011-05-03 DIAGNOSIS — Z7902 Long term (current) use of antithrombotics/antiplatelets: Secondary | ICD-10-CM | POA: Insufficient documentation

## 2011-05-06 ENCOUNTER — Other Ambulatory Visit: Payer: Self-pay | Admitting: *Deleted

## 2011-05-06 DIAGNOSIS — E119 Type 2 diabetes mellitus without complications: Secondary | ICD-10-CM | POA: Diagnosis not present

## 2011-05-06 DIAGNOSIS — I10 Essential (primary) hypertension: Secondary | ICD-10-CM | POA: Diagnosis not present

## 2011-05-06 DIAGNOSIS — C752 Malignant neoplasm of craniopharyngeal duct: Secondary | ICD-10-CM | POA: Diagnosis not present

## 2011-05-06 DIAGNOSIS — I69959 Hemiplegia and hemiparesis following unspecified cerebrovascular disease affecting unspecified side: Secondary | ICD-10-CM | POA: Diagnosis not present

## 2011-05-06 DIAGNOSIS — I69923 Fluency disorder following unspecified cerebrovascular disease: Secondary | ICD-10-CM | POA: Diagnosis not present

## 2011-05-06 MED ORDER — COLLAGENASE 250 UNIT/GM EX OINT: TOPICAL_OINTMENT | Freq: Every day | CUTANEOUS | Status: AC

## 2011-05-06 NOTE — Telephone Encounter (Signed)
Ok for refill on [facility] for Megace 40mg /ml- 2ml TID, disp , 3 refills

## 2011-05-06 NOTE — Telephone Encounter (Signed)
[name] from [place] called to advise pt needs a refill on the santyl for her wound care sent to [facility] on [address] also wanted to request an new RX of Megace for the pt per notes lack of appetite,please advise

## 2011-05-07 ENCOUNTER — Encounter: Payer: Self-pay | Admitting: Physical Medicine & Rehabilitation

## 2011-05-07 ENCOUNTER — Telehealth: Payer: Self-pay | Admitting: Family Medicine

## 2011-05-07 DIAGNOSIS — I10 Essential (primary) hypertension: Secondary | ICD-10-CM | POA: Diagnosis not present

## 2011-05-07 DIAGNOSIS — C752 Malignant neoplasm of craniopharyngeal duct: Secondary | ICD-10-CM | POA: Diagnosis not present

## 2011-05-07 DIAGNOSIS — E119 Type 2 diabetes mellitus without complications: Secondary | ICD-10-CM | POA: Diagnosis not present

## 2011-05-07 DIAGNOSIS — I69923 Fluency disorder following unspecified cerebrovascular disease: Secondary | ICD-10-CM | POA: Diagnosis not present

## 2011-05-07 DIAGNOSIS — C751 Malignant neoplasm of pituitary gland: Secondary | ICD-10-CM | POA: Diagnosis not present

## 2011-05-07 DIAGNOSIS — I69959 Hemiplegia and hemiparesis following unspecified cerebrovascular disease affecting unspecified side: Secondary | ICD-10-CM | POA: Diagnosis not present

## 2011-05-07 MED ORDER — MEGESTROL ACETATE 40 MG/ML PO SUSP: ORAL | Status: AC

## 2011-05-07 NOTE — Telephone Encounter (Signed)
Please advise per wound care ointment

## 2011-05-07 NOTE — Telephone Encounter (Signed)
This was requested by [place]- what do they prefer

## 2011-05-07 NOTE — Telephone Encounter (Signed)
[facility] faxed in stating Collagenase would cost patient $192.00 for qty of 15. Can we recommend something cheaper?

## 2011-05-07 NOTE — Telephone Encounter (Signed)
rx sent to pharmacy by e-script for both medications, called [name] with [facility] to advise the medications have been sent, He advised that he will let the pt know the medications have been sent

## 2011-05-08 DIAGNOSIS — I69959 Hemiplegia and hemiparesis following unspecified cerebrovascular disease affecting unspecified side: Secondary | ICD-10-CM | POA: Diagnosis not present

## 2011-05-08 DIAGNOSIS — I10 Essential (primary) hypertension: Secondary | ICD-10-CM | POA: Diagnosis not present

## 2011-05-08 DIAGNOSIS — I69923 Fluency disorder following unspecified cerebrovascular disease: Secondary | ICD-10-CM | POA: Diagnosis not present

## 2011-05-08 DIAGNOSIS — E119 Type 2 diabetes mellitus without complications: Secondary | ICD-10-CM | POA: Diagnosis not present

## 2011-05-08 DIAGNOSIS — C751 Malignant neoplasm of pituitary gland: Secondary | ICD-10-CM | POA: Diagnosis not present

## 2011-05-08 NOTE — Telephone Encounter (Addendum)
Called [facility] to ask about an alternative for the santyl, he advised this is for the foot or toe and they need an extension on the order for use of common alginate, advised per verbal order from MD Tabori to extend the wound care order to use the alginate, andy noted they have this in stock and will not need a pharmacy RX called in, advised MD Tabori that the pt is currently using ted hose to assist with circulation, MD Tabori made aware verbally, called [facility] to advise no rx alternative needed for the Collagenase per has chosen another route per no rx needed, spoke to [place] and understood to cancel the order for this medication

## 2011-05-09 DIAGNOSIS — C752 Malignant neoplasm of craniopharyngeal duct: Secondary | ICD-10-CM | POA: Diagnosis not present

## 2011-05-09 DIAGNOSIS — I69923 Fluency disorder following unspecified cerebrovascular disease: Secondary | ICD-10-CM | POA: Diagnosis not present

## 2011-05-09 DIAGNOSIS — I10 Essential (primary) hypertension: Secondary | ICD-10-CM | POA: Diagnosis not present

## 2011-05-09 DIAGNOSIS — E119 Type 2 diabetes mellitus without complications: Secondary | ICD-10-CM | POA: Diagnosis not present

## 2011-05-09 DIAGNOSIS — I69959 Hemiplegia and hemiparesis following unspecified cerebrovascular disease affecting unspecified side: Secondary | ICD-10-CM | POA: Diagnosis not present

## 2011-05-10 ENCOUNTER — Telehealth: Payer: Self-pay | Admitting: Family Medicine

## 2011-05-10 DIAGNOSIS — E119 Type 2 diabetes mellitus without complications: Secondary | ICD-10-CM | POA: Diagnosis not present

## 2011-05-10 DIAGNOSIS — I69959 Hemiplegia and hemiparesis following unspecified cerebrovascular disease affecting unspecified side: Secondary | ICD-10-CM | POA: Diagnosis not present

## 2011-05-10 DIAGNOSIS — I69923 Fluency disorder following unspecified cerebrovascular disease: Secondary | ICD-10-CM | POA: Diagnosis not present

## 2011-05-10 DIAGNOSIS — I10 Essential (primary) hypertension: Secondary | ICD-10-CM | POA: Diagnosis not present

## 2011-05-10 DIAGNOSIS — C751 Malignant neoplasm of pituitary gland: Secondary | ICD-10-CM | POA: Diagnosis not present

## 2011-05-10 NOTE — Telephone Encounter (Signed)
Noted.  At this time there is nothing to do.  Thanks for your efforts

## 2011-05-10 NOTE — Telephone Encounter (Signed)
In reference to order for swallow evaluation, per Toniann Fail with [facility], they have contacted the patient numerous times without a return call, and today did reach the patient's daughter Christina Pittman.  Per my call to patient's daughter Christina Pittman, patient is refusing to make an appointment for the test.  Per Christina Pittman, she also informed [facility] of this just this morning.  She states that patient is eating and swallowing just fine now.  [facility] will hold onto patient's order in case something changes.  Please advise.

## 2011-05-13 DIAGNOSIS — E119 Type 2 diabetes mellitus without complications: Secondary | ICD-10-CM | POA: Diagnosis not present

## 2011-05-13 DIAGNOSIS — I69959 Hemiplegia and hemiparesis following unspecified cerebrovascular disease affecting unspecified side: Secondary | ICD-10-CM | POA: Diagnosis not present

## 2011-05-13 DIAGNOSIS — C751 Malignant neoplasm of pituitary gland: Secondary | ICD-10-CM | POA: Diagnosis not present

## 2011-05-13 DIAGNOSIS — I10 Essential (primary) hypertension: Secondary | ICD-10-CM | POA: Diagnosis not present

## 2011-05-13 DIAGNOSIS — I69923 Fluency disorder following unspecified cerebrovascular disease: Secondary | ICD-10-CM | POA: Diagnosis not present

## 2011-05-14 ENCOUNTER — Telehealth: Payer: Self-pay | Admitting: Family Medicine

## 2011-05-14 DIAGNOSIS — C751 Malignant neoplasm of pituitary gland: Secondary | ICD-10-CM | POA: Diagnosis not present

## 2011-05-14 DIAGNOSIS — IMO0002 Reserved for concepts with insufficient information to code with codable children: Secondary | ICD-10-CM | POA: Diagnosis not present

## 2011-05-14 DIAGNOSIS — S79929A Unspecified injury of unspecified thigh, initial encounter: Secondary | ICD-10-CM | POA: Diagnosis not present

## 2011-05-14 DIAGNOSIS — R29818 Other symptoms and signs involving the nervous system: Secondary | ICD-10-CM | POA: Diagnosis not present

## 2011-05-14 DIAGNOSIS — S51809A Unspecified open wound of unspecified forearm, initial encounter: Secondary | ICD-10-CM | POA: Diagnosis not present

## 2011-05-14 DIAGNOSIS — S32509A Unspecified fracture of unspecified pubis, initial encounter for closed fracture: Secondary | ICD-10-CM | POA: Diagnosis not present

## 2011-05-14 DIAGNOSIS — S1093XA Contusion of unspecified part of neck, initial encounter: Secondary | ICD-10-CM | POA: Diagnosis not present

## 2011-05-14 DIAGNOSIS — I69923 Fluency disorder following unspecified cerebrovascular disease: Secondary | ICD-10-CM | POA: Diagnosis not present

## 2011-05-14 DIAGNOSIS — E119 Type 2 diabetes mellitus without complications: Secondary | ICD-10-CM | POA: Diagnosis not present

## 2011-05-14 DIAGNOSIS — C752 Malignant neoplasm of craniopharyngeal duct: Secondary | ICD-10-CM | POA: Diagnosis not present

## 2011-05-14 DIAGNOSIS — I6789 Other cerebrovascular disease: Secondary | ICD-10-CM | POA: Diagnosis not present

## 2011-05-14 DIAGNOSIS — Z79899 Other long term (current) drug therapy: Secondary | ICD-10-CM | POA: Diagnosis not present

## 2011-05-14 DIAGNOSIS — S0003XA Contusion of scalp, initial encounter: Secondary | ICD-10-CM | POA: Diagnosis not present

## 2011-05-14 DIAGNOSIS — S79919A Unspecified injury of unspecified hip, initial encounter: Secondary | ICD-10-CM | POA: Diagnosis not present

## 2011-05-14 DIAGNOSIS — I69959 Hemiplegia and hemiparesis following unspecified cerebrovascular disease affecting unspecified side: Secondary | ICD-10-CM | POA: Diagnosis not present

## 2011-05-14 DIAGNOSIS — T1490XA Injury, unspecified, initial encounter: Secondary | ICD-10-CM | POA: Diagnosis not present

## 2011-05-14 DIAGNOSIS — I10 Essential (primary) hypertension: Secondary | ICD-10-CM | POA: Diagnosis not present

## 2011-05-14 DIAGNOSIS — S0990XA Unspecified injury of head, initial encounter: Secondary | ICD-10-CM | POA: Diagnosis not present

## 2011-05-14 DIAGNOSIS — S329XXA Fracture of unspecified parts of lumbosacral spine and pelvis, initial encounter for closed fracture: Secondary | ICD-10-CM

## 2011-05-14 HISTORY — DX: Fracture of unspecified parts of lumbosacral spine and pelvis, initial encounter for closed fracture: S32.9XXA

## 2011-05-14 NOTE — Telephone Encounter (Signed)
aller: Gail/daughter; PCP: Sheliah Hatch.; CB#: [phone]; ; ; Call regarding Cuts, Scrapes, Abrasions;  Pt fell this am and has a broken pelvis front and back. Pt was treated at [facility] and  sent home. Pt aslo has a contusion to the scalp and she has a cut on her arm that did not require stitches. The daughter states that the pt will be receiving PT/they are coming out today and the hospital advised they call and inform their PCP. Information taken and note sent to MD.

## 2011-05-15 DIAGNOSIS — E119 Type 2 diabetes mellitus without complications: Secondary | ICD-10-CM | POA: Diagnosis not present

## 2011-05-15 DIAGNOSIS — I69959 Hemiplegia and hemiparesis following unspecified cerebrovascular disease affecting unspecified side: Secondary | ICD-10-CM | POA: Diagnosis not present

## 2011-05-15 DIAGNOSIS — I69923 Fluency disorder following unspecified cerebrovascular disease: Secondary | ICD-10-CM | POA: Diagnosis not present

## 2011-05-15 DIAGNOSIS — I10 Essential (primary) hypertension: Secondary | ICD-10-CM | POA: Diagnosis not present

## 2011-05-15 DIAGNOSIS — C752 Malignant neoplasm of craniopharyngeal duct: Secondary | ICD-10-CM | POA: Diagnosis not present

## 2011-05-15 DIAGNOSIS — C751 Malignant neoplasm of pituitary gland: Secondary | ICD-10-CM | POA: Diagnosis not present

## 2011-05-15 NOTE — Telephone Encounter (Signed)
Pt is not safe at home as she continues to fall.  Should consider nursing home placement

## 2011-05-15 NOTE — Telephone Encounter (Signed)
Called [facility] to advise the pt has been discharged from the hospital per pelvic fracture and MD Beverely Low does not feel the pt is safe at home per current condition and wants to have [facility] to advise to family possibly placement in a nursing home, left vm with pt and family information as well as my extension

## 2011-05-15 NOTE — Telephone Encounter (Signed)
Marcelino Duster from [facility] called to advise that Alvino Chapel is the pt case worker and that she is currently working on concerns for pt and will notify us when they have an update concerning placement/needs MD Beverely Low made aware verbally

## 2011-05-16 DIAGNOSIS — Z79899 Other long term (current) drug therapy: Secondary | ICD-10-CM | POA: Diagnosis not present

## 2011-05-16 DIAGNOSIS — Z8673 Personal history of transient ischemic attack (TIA), and cerebral infarction without residual deficits: Secondary | ICD-10-CM | POA: Diagnosis not present

## 2011-05-16 DIAGNOSIS — E1169 Type 2 diabetes mellitus with other specified complication: Secondary | ICD-10-CM | POA: Diagnosis present

## 2011-05-16 DIAGNOSIS — Z7902 Long term (current) use of antithrombotics/antiplatelets: Secondary | ICD-10-CM | POA: Diagnosis not present

## 2011-05-16 DIAGNOSIS — I798 Other disorders of arteries, arterioles and capillaries in diseases classified elsewhere: Secondary | ICD-10-CM | POA: Diagnosis present

## 2011-05-16 DIAGNOSIS — R5381 Other malaise: Secondary | ICD-10-CM | POA: Diagnosis not present

## 2011-05-16 DIAGNOSIS — E1159 Type 2 diabetes mellitus with other circulatory complications: Secondary | ICD-10-CM | POA: Diagnosis present

## 2011-05-16 DIAGNOSIS — R918 Other nonspecific abnormal finding of lung field: Secondary | ICD-10-CM | POA: Diagnosis not present

## 2011-05-16 DIAGNOSIS — E860 Dehydration: Secondary | ICD-10-CM | POA: Diagnosis not present

## 2011-05-16 DIAGNOSIS — R7301 Impaired fasting glucose: Secondary | ICD-10-CM | POA: Diagnosis not present

## 2011-05-16 DIAGNOSIS — Z8679 Personal history of other diseases of the circulatory system: Secondary | ICD-10-CM | POA: Diagnosis not present

## 2011-05-16 DIAGNOSIS — R011 Cardiac murmur, unspecified: Secondary | ICD-10-CM | POA: Diagnosis not present

## 2011-05-16 DIAGNOSIS — E119 Type 2 diabetes mellitus without complications: Secondary | ICD-10-CM | POA: Diagnosis not present

## 2011-05-16 DIAGNOSIS — S329XXA Fracture of unspecified parts of lumbosacral spine and pelvis, initial encounter for closed fracture: Secondary | ICD-10-CM | POA: Diagnosis not present

## 2011-05-16 DIAGNOSIS — R4182 Altered mental status, unspecified: Secondary | ICD-10-CM | POA: Diagnosis not present

## 2011-05-16 DIAGNOSIS — S72009D Fracture of unspecified part of neck of unspecified femur, subsequent encounter for closed fracture with routine healing: Secondary | ICD-10-CM | POA: Diagnosis not present

## 2011-05-16 DIAGNOSIS — I872 Venous insufficiency (chronic) (peripheral): Secondary | ICD-10-CM | POA: Diagnosis present

## 2011-05-16 DIAGNOSIS — L97509 Non-pressure chronic ulcer of other part of unspecified foot with unspecified severity: Secondary | ICD-10-CM | POA: Diagnosis not present

## 2011-05-16 DIAGNOSIS — E162 Hypoglycemia, unspecified: Secondary | ICD-10-CM | POA: Diagnosis not present

## 2011-05-16 DIAGNOSIS — L97909 Non-pressure chronic ulcer of unspecified part of unspecified lower leg with unspecified severity: Secondary | ICD-10-CM | POA: Diagnosis not present

## 2011-05-16 DIAGNOSIS — M1990 Unspecified osteoarthritis, unspecified site: Secondary | ICD-10-CM | POA: Diagnosis present

## 2011-05-16 DIAGNOSIS — I10 Essential (primary) hypertension: Secondary | ICD-10-CM | POA: Diagnosis not present

## 2011-05-16 DIAGNOSIS — I739 Peripheral vascular disease, unspecified: Secondary | ICD-10-CM | POA: Diagnosis not present

## 2011-05-17 ENCOUNTER — Emergency Department (HOSPITAL_BASED_OUTPATIENT_CLINIC_OR_DEPARTMENT_OTHER)
Admission: EM | Admit: 2011-05-17 | Discharge: 2011-05-18 | Disposition: A | Discharge status: Other Acute Inpatient Hospital | Payer: Medicare Other | Attending: Emergency Medicine | Admitting: Emergency Medicine

## 2011-05-17 ENCOUNTER — Encounter (HOSPITAL_BASED_OUTPATIENT_CLINIC_OR_DEPARTMENT_OTHER): Payer: Self-pay

## 2011-05-17 ENCOUNTER — Encounter (HOSPITAL_BASED_OUTPATIENT_CLINIC_OR_DEPARTMENT_OTHER): Payer: Medicare Other

## 2011-05-17 DIAGNOSIS — L97509 Non-pressure chronic ulcer of other part of unspecified foot with unspecified severity: Secondary | ICD-10-CM | POA: Diagnosis not present

## 2011-05-17 DIAGNOSIS — E860 Dehydration: Secondary | ICD-10-CM | POA: Insufficient documentation

## 2011-05-17 DIAGNOSIS — S329XXA Fracture of unspecified parts of lumbosacral spine and pelvis, initial encounter for closed fracture: Secondary | ICD-10-CM | POA: Insufficient documentation

## 2011-05-17 DIAGNOSIS — R4182 Altered mental status, unspecified: Secondary | ICD-10-CM | POA: Insufficient documentation

## 2011-05-17 DIAGNOSIS — I10 Essential (primary) hypertension: Secondary | ICD-10-CM | POA: Insufficient documentation

## 2011-05-17 DIAGNOSIS — E119 Type 2 diabetes mellitus without complications: Secondary | ICD-10-CM | POA: Insufficient documentation

## 2011-05-17 DIAGNOSIS — X58XXXA Exposure to other specified factors, initial encounter: Secondary | ICD-10-CM | POA: Insufficient documentation

## 2011-05-17 DIAGNOSIS — Z8673 Personal history of transient ischemic attack (TIA), and cerebral infarction without residual deficits: Secondary | ICD-10-CM | POA: Diagnosis not present

## 2011-05-17 DIAGNOSIS — R011 Cardiac murmur, unspecified: Secondary | ICD-10-CM | POA: Insufficient documentation

## 2011-05-17 DIAGNOSIS — Z8679 Personal history of other diseases of the circulatory system: Secondary | ICD-10-CM | POA: Insufficient documentation

## 2011-05-17 HISTORY — DX: Fracture of unspecified parts of lumbosacral spine and pelvis, initial encounter for closed fracture: S32.9XXA

## 2011-05-17 LAB — URINALYSIS, ROUTINE W REFLEX MICROSCOPIC
Ketones, ur: NEGATIVE mg/dL
Leukocytes, UA: NEGATIVE
Nitrite: NEGATIVE
Protein, ur: NEGATIVE mg/dL
Urobilinogen, UA: 1 mg/dL (ref 0.0–1.0)

## 2011-05-17 LAB — BASIC METABOLIC PANEL
CO2: 20 mEq/L (ref 19–32)
Calcium: 8.8 mg/dL (ref 8.4–10.5)
Chloride: 104 mEq/L (ref 96–112)
Glucose, Bld: 149 mg/dL — ABNORMAL HIGH (ref 70–99)
Potassium: 4.7 mEq/L (ref 3.5–5.1)
Sodium: 134 mEq/L — ABNORMAL LOW (ref 135–145)

## 2011-05-17 LAB — DIFFERENTIAL
Basophils Absolute: 0 10*3/uL (ref 0.0–0.1)
Lymphocytes Relative: 13 % (ref 12–46)
Lymphs Abs: 1.8 10*3/uL (ref 0.7–4.0)
Neutro Abs: 10.2 10*3/uL — ABNORMAL HIGH (ref 1.7–7.7)
Neutrophils Relative %: 74 % (ref 43–77)

## 2011-05-17 LAB — CBC
MCV: 91.5 fL (ref 78.0–100.0)
Platelets: 242 10*3/uL (ref 150–400)
RBC: 3.4 MIL/uL — ABNORMAL LOW (ref 3.87–5.11)
RDW: 13.4 % (ref 11.5–15.5)
WBC: 13.9 10*3/uL — ABNORMAL HIGH (ref 4.0–10.5)

## 2011-05-17 MED ORDER — SODIUM CHLORIDE 0.9 % IV SOLN
INTRAVENOUS | Status: DC
  Administered 2011-05-17: 20:00:00 via INTRAVENOUS

## 2011-05-17 NOTE — ED Notes (Signed)
Report given to [facility] RN at [facility] [address]

## 2011-05-17 NOTE — ED Notes (Signed)
Request form [facility] ED for records from x 1 day ago

## 2011-05-17 NOTE — ED Provider Notes (Signed)
History     CSN: [id number]  Arrival date & time 05/17/11  1847   First MD Initiated Contact with Patient 05/17/11 1924      Chief Complaint  Patient presents with  . Altered Mental Status    (Consider location/radiation/quality/duration/timing/severity/associated sxs/prior treatment) HPI Comments: Patient is an 78 year old woman who lives in her own home. She is cared for by her son and daughter, who are with her 24/7. She had a stroke in February 2013, fractured her pelvis on 05/14/2011. Her blood sugar went down to 40 yesterday. She was taken to [facility], where admission was offered but she refused and left AMA. Apparently today she has been disoriented is not eating or drinking. She was therefore brought to [facility] for evaluation.  Pt is demented and is unable to contribute to her history or to perform review of systems.  She asks her daughter to take her home.  Patient is a 78 y.o. female presenting with altered mental status. The history is provided by a relative and medical records. No language interpreter was used.  Altered Mental Status This is a chronic problem. The current episode started 12 to 24 hours ago. The problem occurs constantly. The problem has been gradually worsening. Associated symptoms comments: Not eating or drinking much.  Frequent falls.  . The symptoms are aggravated by nothing. The symptoms are relieved by nothing. She has tried nothing for the symptoms.    Past Medical History  Diagnosis Date  . Arthritis   . Diabetes mellitus   . Hypertension   . Heart murmur   . Chicken pox   . Stroke   . Fractured pelvis 05/14/2011    Past Surgical History  Procedure Date  . Abdominal hysterectomy     Family History  Problem Relation Age of Onset  . Arthritis Mother   . Diabetes Sister   . Heart disease Brother   . Diabetes Brother     History  Substance Use Topics  . Smoking status: Never Smoker   . Smokeless  tobacco: Not on file  . Alcohol Use: No    OB History    Grav Para Term Preterm Abortions TAB SAB Ect Mult Living                  Review of Systems  Unable to perform ROS: Mental status change  Psychiatric/Behavioral: Positive for altered mental status.    Allergies  Betadine; Codeine; Neosporin; and Pedialyte  Home Medications   Current Outpatient Rx  Name Route Sig Dispense Refill  . CARVEDILOL 25 MG PO TABS Oral Take 25 mg by mouth 2 (two) times daily with a meal.    . CLOPIDOGREL BISULFATE 75 MG PO TABS Oral Take 75 mg by mouth daily with breakfast.    . FUROSEMIDE 20 MG PO TABS Oral Take 20 mg by mouth daily.    Marland Kitchen GLIMEPIRIDE 2 MG PO TABS Oral Take 2 mg by mouth daily with breakfast.    . MEGESTROL ACETATE 40 MG/ML PO SUSP  2 ML TID 240 mL 3  . SIMVASTATIN 20 MG PO TABS Oral Take 20 mg by mouth daily at 6 PM.    . COLLAGENASE 250 UNIT/GM EX OINT Topical Apply topically daily. 15 g 1  . OVER THE COUNTER MEDICATION  OTC medication Patient said it's similar to Tylenol, but not Tylenol.      BP 108/60  Pulse 73  Temp 98.4 F (36.9 C)  Resp 24  SpO2 99%  Physical Exam  Nursing note and vitals reviewed. Constitutional:       Elderly woman who appears chronically ill.    HENT:  Head: Normocephalic and atraumatic.  Right Ear: External ear normal.  Left Ear: External ear normal.       Has thrush on her tongue.  Eyes: Conjunctivae and EOM are normal. Pupils are equal, round, and reactive to light.  Neck: Normal range of motion. Neck supple. No JVD present.  Cardiovascular: Normal rate and regular rhythm.   Pulmonary/Chest: Effort normal and breath sounds normal.  Abdominal: Soft. Bowel sounds are normal. She exhibits no distension. There is no tenderness.  Musculoskeletal: Normal range of motion.  Neurological:       Awake, does not answer questions, but does converse a bit with her daughter.  Skin: Skin is warm and dry.  Psychiatric:       Demented.    ED  Course  Procedures (including critical care time)  Labs Reviewed  GLUCOSE, CAPILLARY - Abnormal; Notable for the following:    Glucose-Capillary 174 (*)    All other components within normal limits  URINALYSIS, ROUTINE W REFLEX MICROSCOPIC  CBC  DIFFERENTIAL  BASIC METABOLIC PANEL  URINALYSIS, ROUTINE W REFLEX MICROSCOPIC   7:42 PM Pt seen --> physical exam performed.  Old charts reviewed.  Basic labs and IV fluids ordered.  9:44 PM Results for orders placed during the hospital encounter of 05/17/11  URINALYSIS, ROUTINE W REFLEX MICROSCOPIC      Component Value Range   Color, Urine AMBER (*) YELLOW    APPearance CLEAR  CLEAR    Specific Gravity, Urine 1.024  1.005 - 1.030    pH 5.0  5.0 - 8.0    Glucose, UA NEGATIVE  NEGATIVE (mg/dL)   Hgb urine dipstick NEGATIVE  NEGATIVE    Bilirubin Urine SMALL (*) NEGATIVE    Ketones, ur NEGATIVE  NEGATIVE (mg/dL)   Protein, ur NEGATIVE  NEGATIVE (mg/dL)   Urobilinogen, UA 1.0  0.0 - 1.0 (mg/dL)   Nitrite NEGATIVE  NEGATIVE    Leukocytes, UA NEGATIVE  NEGATIVE   GLUCOSE, CAPILLARY      Component Value Range   Glucose-Capillary 174 (*) 70 - 99 (mg/dL)  CBC      Component Value Range   WBC 13.9 (*) 4.0 - 10.5 (K/uL)   RBC 3.40 (*) 3.87 - 5.11 (MIL/uL)   Hemoglobin 10.6 (*) 12.0 - 15.0 (g/dL)   HCT 16.1 (*) 09.6 - 46.0 (%)   MCV 91.5  78.0 - 100.0 (fL)   MCH 31.2  26.0 - 34.0 (pg)   MCHC 34.1  30.0 - 36.0 (g/dL)   RDW 04.5  40.9 - 81.1 (%)   Platelets 242  150 - 400 (K/uL)  DIFFERENTIAL      Component Value Range   Neutrophils Relative 74  43 - 77 (%)   Neutro Abs 10.2 (*) 1.7 - 7.7 (K/uL)   Lymphocytes Relative 13  12 - 46 (%)   Lymphs Abs 1.8  0.7 - 4.0 (K/uL)   Monocytes Relative 12  3 - 12 (%)   Monocytes Absolute 1.6 (*) 0.1 - 1.0 (K/uL)   Eosinophils Relative 1  0 - 5 (%)   Eosinophils Absolute 0.2  0.0 - 0.7 (K/uL)   Basophils Relative 0  0 - 1 (%)   Basophils Absolute 0.0  0.0 - 0.1 (K/uL)  BASIC METABOLIC PANEL       Component Value  Range   Sodium 134 (*) 135 - 145 (mEq/L)   Potassium 4.7  3.5 - 5.1 (mEq/L)   Chloride 104  96 - 112 (mEq/L)   CO2 20  19 - 32 (mEq/L)   Glucose, Bld 149 (*) 70 - 99 (mg/dL)   BUN 30 (*) 6 - 23 (mg/dL)   Creatinine, Ser 1.47 (*) 0.50 - 1.10 (mg/dL)   Calcium 8.8  8.4 - 82.9 (mg/dL)   GFR calc non Af Amer 39 (*) >90 (mL/min)   GFR calc Af Amer 45 (*) >90 (mL/min)   Dg Chest 2 View  04/22/2011  *RADIOLOGY REPORT*  Clinical Data: Shortness of breath and weakness.  CHEST - 2 VIEW  Comparison: 04/18/2011  Findings: The cardiopericardial silhouette is enlarged. Interstitial markings are diffusely coarsened with chronic features.  Bibasilar atelectasis/infiltrate, right greater than left, again noted. Bones are diffusely demineralized.  IMPRESSION: Cardiomegaly with underlying chronic interstitial lung disease.  Bibasilar atelectasis/infiltrate, right greater than left.  Original Report Authenticated By: ERIC A. MANSELL, M.D.   Dg Ribs Unilateral W/chest Left  04/18/2011  *RADIOLOGY REPORT*  Clinical Data: Status post multiple falls and past few weeks; left lower rib pain.  LEFT RIBS AND CHEST - 3+ VIEW  Comparison: Chest radiograph performed 03/13/2011  Findings: The lungs are well-aerated.  A small right pleural effusion is noted, with right basilar airspace opacity.  Mild left basilar atelectasis is noted.  There is no evidence of pneumothorax.  The cardiomediastinal silhouette is borderline normal in size; calcification is noted within the aortic arch.  No acute osseous abnormalities are seen.  Postoperative change is seen at the left upper quadrant.  IMPRESSION:  1.  No definite displaced rib fractures seen. 2.  Small right pleural effusion, with mild right basilar airspace opacity; minimal left basilar atelectasis noted.  Original Report Authenticated By: Tonia Ghent, M.D.   Ct Angio Chest W/cm &/or Wo Cm  04/19/2011  *RADIOLOGY REPORT*  Clinical Data: Chest pain and shortness of  breath; pleural effusion noted on chest radiograph.  CT ANGIOGRAPHY CHEST  Technique:  Multidetector CT imaging of the chest using the standard protocol during bolus administration of intravenous contrast. Multiplanar reconstructed images including MIPs were obtained and reviewed to evaluate the vascular anatomy.  Contrast: OMNIPAQUE IOHEXOL 350 MG/ML IV SOLN  Comparison: Chest radiograph performed 04/18/2011  Findings: There is no evidence of significant pulmonary embolus, though evaluation for pulmonary embolus is suboptimal in areas of airspace consolidation.  Minimal apparent defect within a left upper lobe pulmonary artery is thought to reflect motion artifact.  There is dense airspace consolidation within the right lower lobe, compatible with pneumonia.  An associated small loculated right pleural effusion is noted. There is mild diffuse prominence of the interstitium, raising question for mild underlying interstitial edema.  Mild bilateral atelectasis is noted.  There is no evidence of pneumothorax.  No masses are identified; no abnormal focal contrast enhancement is seen.  There is aneurysmal dilatation of the ascending thoracic aorta to 4.3 cm in AP dimension.  Diffuse coronary artery calcifications are seen.  No pericardial effusion is identified.  Scattered prominent right paratracheal and aortopulmonary window nodes are seen, measuring up to 1.3 cm in short axis.  No pericardial effusion is identified.  The great vessels are grossly unremarkable in appearance.  No axillary lymphadenopathy is seen.  The visualized portions of the thyroid gland are unremarkable in appearance.  The visualized portions of the liver and spleen are unremarkable.  No acute osseous  abnormalities are seen.  IMPRESSION:  1.  No evidence of significant pulmonary embolus. 2.  Dense right lower lobe pneumonia noted, with an associated small loculated right-sided pleural effusion. 3.  Mild diffuse prominence of the interstitium  raises question for mild underlying interstitial edema. 4.  Aneurysmal dilatation of the ascending thoracic aorta to 4.3 cm in AP dimension. 5.  Diffuse coronary artery calcifications seen.  6.  Scattered prominent right paratracheal and aortopulmonary window nodes seen, measuring up to 1.3 cm in short axis.  Original Report Authenticated By: Tonia Ghent, M.D.   Dg Humerus Left  04/18/2011  *RADIOLOGY REPORT*  Clinical Data: The multiple falls.  Left arm pain.  LEFT HUMERUS - 2+ VIEW  Comparison: None.  Findings: The elbow and shoulder joints are located.  No acute bone or soft tissue abnormality is evident.  IMPRESSION: Negative left humerus.  Original Report Authenticated By: Jamesetta Orleans. MATTERN, M.D.    Lab workup suggests dehydration.  She continues to have altered mental status, talking about standing in line for something.  I recommended admission for continued hydration, following of her mental status.    11:09 PM Case discussed with Pearson Grippe, M.D., hospitalist at [facility].  He accepts pt for admission to a med-surg bed.  1. Altered mental status   2. Dehydration   3. Pelvic fracture             Carleene Cooper III, MD 05/17/11 2312

## 2011-05-17 NOTE — ED Notes (Signed)
Son states that pt was taken to [facility] yesterday by EMS for hypoglycemia, and she didn't want to stay for admission so they discharged her AMA per her own wishes.  Son states today that she has been disoriented today and not eating.  They request fluids and discharge home.

## 2011-05-21 ENCOUNTER — Encounter: Payer: Self-pay | Admitting: Family Medicine

## 2011-05-21 ENCOUNTER — Ambulatory Visit (INDEPENDENT_AMBULATORY_CARE_PROVIDER_SITE_OTHER): Payer: Medicare Other | Admitting: Family Medicine

## 2011-05-21 VITALS — BP 138/85 | HR 78 | Temp 98.2°F

## 2011-05-21 DIAGNOSIS — D353 Benign neoplasm of craniopharyngeal duct: Secondary | ICD-10-CM | POA: Diagnosis not present

## 2011-05-21 DIAGNOSIS — S329XXA Fracture of unspecified parts of lumbosacral spine and pelvis, initial encounter for closed fracture: Secondary | ICD-10-CM

## 2011-05-21 DIAGNOSIS — D352 Benign neoplasm of pituitary gland: Secondary | ICD-10-CM | POA: Diagnosis not present

## 2011-05-21 DIAGNOSIS — E119 Type 2 diabetes mellitus without complications: Secondary | ICD-10-CM

## 2011-05-21 LAB — BASIC METABOLIC PANEL
BUN: 15 mg/dL (ref 6–23)
CO2: 20 mEq/L (ref 19–32)
Chloride: 107 mEq/L (ref 96–112)
GFR: 64.17 mL/min (ref >60.00)
Glucose, Bld: 134 mg/dL — ABNORMAL HIGH (ref 70–99)
Potassium: 3.4 mEq/L — ABNORMAL LOW (ref 3.5–5.1)
Sodium: 138 mEq/L (ref 135–145)

## 2011-05-21 MED ORDER — GLUCOSE BLOOD VI STRP: ORAL_STRIP | Status: DC

## 2011-05-21 MED ORDER — NATEGLINIDE 60 MG PO TABS: 60.0000 mg | ORAL_TABLET | Freq: Three times a day (TID) | ORAL | Status: DC
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
## 2011-05-21 NOTE — Assessment & Plan Note (Signed)
Chronic problem.  Pt has been holding amaryl since hospital d/c due to hypoglycemia.  Since pt is not eating regularly will switch to Starlix so that pt can take med if she eats but can hold if she skips meal.  Reviewed instructions w/ both pt and her children.  Will follow closely.

## 2011-05-21 NOTE — Patient Instructions (Addendum)
We'll notify you of your lab results Start the Starlix 3x/day up to 30 minutes before meals- if you don't eat, don't take this medicine Call with any questions or concerns Hang in there!

## 2011-05-21 NOTE — Assessment & Plan Note (Signed)
New.  Found on imaging in 2/13.  Will refer to neuro for additional discussion/work up.  Stressed that surgery is likely not an option given her age and multiple comorbidities.  Will follow.

## 2011-05-21 NOTE — Progress Notes (Signed)
  Subjective:    Patient ID: Christina Pittman, female    DOB: 1919-08-30, 78 y.o.   MRN: [id number]  HPI Returns today after fracturing pelvis and then hospital admit for refusing to eat/drink.  CBG dropped to 40.  Left hospital AMA yesterday- they wanted to keep her 2 weeks for rehab.  Did not sleep at all last night.  Son reports she was confused this AM and did not know where she was.  Was told to have MRI of brain w/in 1 week of D/C- no dx given.  Son reports growth on pituitary.  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  Has not seen neuro about this finding.  Pt reports she is now eating regularly.  Has been holding amaryl since hospital d/c last week.   Review of Systems Denies HA, CP, SOB, N/V.    Objective:   Physical Exam  Constitutional: No distress.       Elderly, frail appearing  HENT:  Head: Normocephalic and atraumatic.  Neck: Neck supple.  Cardiovascular: Normal rate, regular rhythm, normal heart sounds and intact distal pulses.   Pulmonary/Chest: Effort normal and breath sounds normal. No respiratory distress. She has no wheezes. She has no rales.  Abdominal: Soft. She exhibits no distension. There is no tenderness. There is no rebound and no guarding.  Lymphadenopathy:    She has no cervical adenopathy.  Psychiatric:       Pt sitting in wheel chair- does not speak unless directly spoken to.  Answers direct questions appropriately.          Assessment & Plan:

## 2011-05-22 DIAGNOSIS — E1169 Type 2 diabetes mellitus with other specified complication: Secondary | ICD-10-CM | POA: Diagnosis not present

## 2011-05-22 DIAGNOSIS — L97509 Non-pressure chronic ulcer of other part of unspecified foot with unspecified severity: Secondary | ICD-10-CM | POA: Diagnosis not present

## 2011-05-22 DIAGNOSIS — M6281 Muscle weakness (generalized): Secondary | ICD-10-CM | POA: Diagnosis not present

## 2011-05-22 DIAGNOSIS — R269 Unspecified abnormalities of gait and mobility: Secondary | ICD-10-CM | POA: Diagnosis not present

## 2011-05-22 DIAGNOSIS — IMO0001 Reserved for inherently not codable concepts without codable children: Secondary | ICD-10-CM | POA: Diagnosis not present

## 2011-05-23 ENCOUNTER — Telehealth: Payer: Self-pay | Admitting: Family Medicine

## 2011-05-23 DIAGNOSIS — M6281 Muscle weakness (generalized): Secondary | ICD-10-CM | POA: Diagnosis not present

## 2011-05-23 DIAGNOSIS — E1169 Type 2 diabetes mellitus with other specified complication: Secondary | ICD-10-CM | POA: Diagnosis not present

## 2011-05-23 DIAGNOSIS — R269 Unspecified abnormalities of gait and mobility: Secondary | ICD-10-CM | POA: Diagnosis not present

## 2011-05-23 DIAGNOSIS — IMO0001 Reserved for inherently not codable concepts without codable children: Secondary | ICD-10-CM | POA: Diagnosis not present

## 2011-05-23 DIAGNOSIS — L97509 Non-pressure chronic ulcer of other part of unspecified foot with unspecified severity: Secondary | ICD-10-CM | POA: Diagnosis not present

## 2011-05-23 NOTE — Telephone Encounter (Signed)
Lori with [facility], calld regarding medical management of this patient within 30-day post-hospital visit. Please call  [phone]

## 2011-05-24 DIAGNOSIS — R269 Unspecified abnormalities of gait and mobility: Secondary | ICD-10-CM | POA: Diagnosis not present

## 2011-05-24 DIAGNOSIS — L97509 Non-pressure chronic ulcer of other part of unspecified foot with unspecified severity: Secondary | ICD-10-CM | POA: Diagnosis not present

## 2011-05-24 DIAGNOSIS — IMO0001 Reserved for inherently not codable concepts without codable children: Secondary | ICD-10-CM | POA: Diagnosis not present

## 2011-05-24 DIAGNOSIS — M6281 Muscle weakness (generalized): Secondary | ICD-10-CM | POA: Diagnosis not present

## 2011-05-24 DIAGNOSIS — E1169 Type 2 diabetes mellitus with other specified complication: Secondary | ICD-10-CM | POA: Diagnosis not present

## 2011-05-27 ENCOUNTER — Other Ambulatory Visit: Payer: Self-pay | Admitting: Family Medicine

## 2011-05-27 DIAGNOSIS — IMO0001 Reserved for inherently not codable concepts without codable children: Secondary | ICD-10-CM | POA: Diagnosis not present

## 2011-05-27 DIAGNOSIS — L97509 Non-pressure chronic ulcer of other part of unspecified foot with unspecified severity: Secondary | ICD-10-CM | POA: Diagnosis not present

## 2011-05-27 DIAGNOSIS — M6281 Muscle weakness (generalized): Secondary | ICD-10-CM | POA: Diagnosis not present

## 2011-05-27 DIAGNOSIS — R269 Unspecified abnormalities of gait and mobility: Secondary | ICD-10-CM | POA: Diagnosis not present

## 2011-05-27 DIAGNOSIS — E1169 Type 2 diabetes mellitus with other specified complication: Secondary | ICD-10-CM | POA: Diagnosis not present

## 2011-05-27 NOTE — Telephone Encounter (Signed)
Refill for Trama-Dol HCL 50MG  Tab Qty 30 take one tablet by mouth every 8-hours as needed for pain Last filled 3.28.13  Last OV 4.23.13

## 2011-05-27 NOTE — Telephone Encounter (Signed)
If pt is not drinking she should not take Lasix

## 2011-05-27 NOTE — Telephone Encounter (Signed)
Ok for Zocor and Coreg.  NO AMARYL.  Ok for plavix and tramadol

## 2011-05-27 NOTE — Telephone Encounter (Signed)
Also needs 4-more  Zocor 20mg  tab  Qty 30 Take 1 tablet by mouth every day at 6pm Last filled 3.27.13 &  Amaryl 2MG  Tab  Qty 30 Take one tablet by mouth every day with Breakfast Last Filled 3.27.13  &  Plavix 75MG  Tab Qty 30 Take one tablet by mouth every day with Breakfast Last filled 3.27.13  &  Coreg 25MG  tabs Qty 60 Take one tablet by mouth twice daily with meals Last filled 3.27.13

## 2011-05-27 NOTE — Telephone Encounter (Signed)
Called nurse Lawson Fiscal, she advised that the pt is not eating or drinking very well and noting dehydration, pt family wants a hospital bed to be ordered for pt, and was advised this could be ordered however concern over the pt not wanting to get out of the bed once she is in one, her sugar is stable, pt son does not want to give the pt the lasix per she is not swelling, pt son was explained the reasons for the lasix, had to also explain the reason for the starlix again, advised all these medications were explained to the family and pt in the office during last visit, pt family was advised the rational for the lasix on wednesday and pt son did finally advise that he will give her the lasix and starlix as directed on Wednesday, son noted he wants the pt to go to therapy but the pt declines the therapy does not want to go, son wanted to push the issue and nurse asked if anyone has power of attorney, pt spoke up and said "NO", family also requested referral to [facility] per no longer wants to go to the [facility], please advise

## 2011-05-27 NOTE — Telephone Encounter (Signed)
Please advise per last Plavix was ordered via Vassie Loll

## 2011-05-27 NOTE — Telephone Encounter (Signed)
This family desperately needs social work help.  Please contact [facility] again b/c I feel the next step is Hospice- pt is refusing therapy, refusing to eat or drink, family is not willing to accept diagnosis or decline.

## 2011-05-28 DIAGNOSIS — R269 Unspecified abnormalities of gait and mobility: Secondary | ICD-10-CM | POA: Diagnosis not present

## 2011-05-28 DIAGNOSIS — M6281 Muscle weakness (generalized): Secondary | ICD-10-CM | POA: Diagnosis not present

## 2011-05-28 DIAGNOSIS — L97509 Non-pressure chronic ulcer of other part of unspecified foot with unspecified severity: Secondary | ICD-10-CM | POA: Diagnosis not present

## 2011-05-28 DIAGNOSIS — E1169 Type 2 diabetes mellitus with other specified complication: Secondary | ICD-10-CM | POA: Diagnosis not present

## 2011-05-28 DIAGNOSIS — IMO0001 Reserved for inherently not codable concepts without codable children: Secondary | ICD-10-CM | POA: Diagnosis not present

## 2011-05-28 MED ORDER — SIMVASTATIN 20 MG PO TABS: 20.0000 mg | ORAL_TABLET | Freq: Every day | ORAL | Status: DC

## 2011-05-28 MED ORDER — TRAMADOL HCL 50 MG PO TABS: 50.0000 mg | ORAL_TABLET | Freq: Three times a day (TID) | ORAL | Status: AC | PRN

## 2011-05-28 MED ORDER — CLOPIDOGREL BISULFATE 75 MG PO TABS: 75.0000 mg | ORAL_TABLET | Freq: Every day | ORAL | Status: DC

## 2011-05-28 MED ORDER — CARVEDILOL 25 MG PO TABS: 25.0000 mg | ORAL_TABLET | Freq: Two times a day (BID) | ORAL | Status: DC

## 2011-05-28 NOTE — Telephone Encounter (Signed)
Sent all escribe except Amaryl per pt is no longer taking this medication per now takes starlix instead

## 2011-05-28 NOTE — Telephone Encounter (Signed)
.  left message to have patient return my call.  
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
## 2011-05-28 NOTE — Telephone Encounter (Signed)
Pt daughter gail returned call, I stressed the need for the pt to take her medication per was advised that son Bernette Redbird is not giving her medication and that hospice has been called in to assist with her pain management per this is cruel for her to be in pain, gail stated "yes we got on kenny yesterday about not giving her the medicine" she advised that she had just left the pt home and she personally gave her the pain medication, I also advised that she does not need to take the Lasix per the fact that she is not drinking, pt daughter understood all instructions.

## 2011-05-28 NOTE — Telephone Encounter (Signed)
Noted  

## 2011-05-28 NOTE — Telephone Encounter (Signed)
If pt is in pain (which I'm guessing she is b/c of her pelvic fx) she must have some sort of pain control.  Since pt is no longer eating or drinking and is in pain, it is appropriate to call Hospice who can at the very least help w/ pain control.  Please stress to the family that she needs to be comfortable and that it is cruel to have her sit in pain.  Also let them know we have called Hospice to assist w/ pain management so that it is not a surprise to them.

## 2011-05-29 ENCOUNTER — Encounter: Payer: Self-pay | Admitting: *Deleted

## 2011-05-29 ENCOUNTER — Telehealth: Payer: Self-pay | Admitting: *Deleted

## 2011-05-29 NOTE — Telephone Encounter (Signed)
Noted.  Appreciate efforts.
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
## 2011-05-30 DIAGNOSIS — E1169 Type 2 diabetes mellitus with other specified complication: Secondary | ICD-10-CM | POA: Diagnosis not present

## 2011-05-30 DIAGNOSIS — R269 Unspecified abnormalities of gait and mobility: Secondary | ICD-10-CM | POA: Diagnosis not present

## 2011-05-30 DIAGNOSIS — IMO0001 Reserved for inherently not codable concepts without codable children: Secondary | ICD-10-CM | POA: Diagnosis not present

## 2011-05-30 DIAGNOSIS — M6281 Muscle weakness (generalized): Secondary | ICD-10-CM | POA: Diagnosis not present

## 2011-05-30 DIAGNOSIS — L97509 Non-pressure chronic ulcer of other part of unspecified foot with unspecified severity: Secondary | ICD-10-CM | POA: Diagnosis not present

## 2011-05-30 NOTE — Telephone Encounter (Signed)
Christina Pittman from [facility] called to let Dr. Beverely Low know that pt son Christina Pittman) has refused hospice treatment for the pt. [name] states he left a msg for [place], but Christina Pittman called him back and he has not spoken with [name].

## 2011-05-30 NOTE — Telephone Encounter (Signed)
MD Tabori made aware verbally 

## 2011-05-31 DIAGNOSIS — IMO0001 Reserved for inherently not codable concepts without codable children: Secondary | ICD-10-CM | POA: Diagnosis not present

## 2011-05-31 DIAGNOSIS — M6281 Muscle weakness (generalized): Secondary | ICD-10-CM | POA: Diagnosis not present

## 2011-05-31 DIAGNOSIS — L97509 Non-pressure chronic ulcer of other part of unspecified foot with unspecified severity: Secondary | ICD-10-CM | POA: Diagnosis not present

## 2011-05-31 DIAGNOSIS — I10 Essential (primary) hypertension: Secondary | ICD-10-CM | POA: Diagnosis not present

## 2011-05-31 DIAGNOSIS — L03119 Cellulitis of unspecified part of limb: Secondary | ICD-10-CM | POA: Diagnosis not present

## 2011-05-31 DIAGNOSIS — E1169 Type 2 diabetes mellitus with other specified complication: Secondary | ICD-10-CM | POA: Diagnosis not present

## 2011-05-31 DIAGNOSIS — E119 Type 2 diabetes mellitus without complications: Secondary | ICD-10-CM | POA: Diagnosis not present

## 2011-05-31 DIAGNOSIS — R269 Unspecified abnormalities of gait and mobility: Secondary | ICD-10-CM | POA: Diagnosis not present

## 2011-05-31 DIAGNOSIS — R209 Unspecified disturbances of skin sensation: Secondary | ICD-10-CM | POA: Diagnosis not present

## 2011-05-31 DIAGNOSIS — L97409 Non-pressure chronic ulcer of unspecified heel and midfoot with unspecified severity: Secondary | ICD-10-CM | POA: Diagnosis not present

## 2011-06-04 DIAGNOSIS — L97509 Non-pressure chronic ulcer of other part of unspecified foot with unspecified severity: Secondary | ICD-10-CM | POA: Diagnosis not present

## 2011-06-04 DIAGNOSIS — L97409 Non-pressure chronic ulcer of unspecified heel and midfoot with unspecified severity: Secondary | ICD-10-CM | POA: Diagnosis not present

## 2011-06-04 DIAGNOSIS — I739 Peripheral vascular disease, unspecified: Secondary | ICD-10-CM | POA: Diagnosis not present

## 2011-06-05 ENCOUNTER — Encounter (HOSPITAL_COMMUNITY): Payer: Self-pay | Admitting: Emergency Medicine

## 2011-06-05 ENCOUNTER — Emergency Department (HOSPITAL_COMMUNITY)
Admission: EM | Admit: 2011-06-05 | Discharge: 2011-06-05 | Disposition: A | Discharge status: Home / Self Care | Payer: Medicare Other | Attending: Emergency Medicine | Admitting: Emergency Medicine

## 2011-06-05 ENCOUNTER — Ambulatory Visit (INDEPENDENT_AMBULATORY_CARE_PROVIDER_SITE_OTHER): Payer: Medicare Other | Admitting: Family Medicine

## 2011-06-05 VITALS — BP 114/71 | HR 80 | Temp 97.9°F | Resp 18

## 2011-06-05 DIAGNOSIS — I96 Gangrene, not elsewhere classified: Secondary | ICD-10-CM | POA: Diagnosis not present

## 2011-06-05 DIAGNOSIS — E869 Volume depletion, unspecified: Secondary | ICD-10-CM

## 2011-06-05 DIAGNOSIS — L97509 Non-pressure chronic ulcer of other part of unspecified foot with unspecified severity: Secondary | ICD-10-CM | POA: Diagnosis not present

## 2011-06-05 DIAGNOSIS — M79609 Pain in unspecified limb: Secondary | ICD-10-CM | POA: Diagnosis not present

## 2011-06-05 DIAGNOSIS — R0789 Other chest pain: Secondary | ICD-10-CM | POA: Diagnosis not present

## 2011-06-05 DIAGNOSIS — IMO0001 Reserved for inherently not codable concepts without codable children: Secondary | ICD-10-CM | POA: Diagnosis not present

## 2011-06-05 DIAGNOSIS — I10 Essential (primary) hypertension: Secondary | ICD-10-CM | POA: Insufficient documentation

## 2011-06-05 DIAGNOSIS — R4182 Altered mental status, unspecified: Secondary | ICD-10-CM

## 2011-06-05 DIAGNOSIS — R269 Unspecified abnormalities of gait and mobility: Secondary | ICD-10-CM | POA: Diagnosis not present

## 2011-06-05 DIAGNOSIS — E119 Type 2 diabetes mellitus without complications: Secondary | ICD-10-CM | POA: Diagnosis not present

## 2011-06-05 DIAGNOSIS — I743 Embolism and thrombosis of arteries of the lower extremities: Secondary | ICD-10-CM | POA: Diagnosis not present

## 2011-06-05 DIAGNOSIS — R05 Cough: Secondary | ICD-10-CM | POA: Diagnosis not present

## 2011-06-05 DIAGNOSIS — E1169 Type 2 diabetes mellitus with other specified complication: Secondary | ICD-10-CM | POA: Diagnosis not present

## 2011-06-05 DIAGNOSIS — R0602 Shortness of breath: Secondary | ICD-10-CM | POA: Diagnosis not present

## 2011-06-05 DIAGNOSIS — R079 Chest pain, unspecified: Secondary | ICD-10-CM | POA: Diagnosis not present

## 2011-06-05 DIAGNOSIS — M6281 Muscle weakness (generalized): Secondary | ICD-10-CM | POA: Diagnosis not present

## 2011-06-05 LAB — CBC
HCT: 35 % — ABNORMAL LOW (ref 36.0–46.0)
Hemoglobin: 11.7 g/dL — ABNORMAL LOW (ref 12.0–15.0)
MCHC: 33.4 g/dL (ref 30.0–36.0)
MCV: 88.8 fL (ref 78.0–100.0)

## 2011-06-05 LAB — BASIC METABOLIC PANEL
BUN: 26 mg/dL — ABNORMAL HIGH (ref 6–23)
Creatinine, Ser: 0.97 mg/dL (ref 0.50–1.10)
GFR calc non Af Amer: 50 mL/min — ABNORMAL LOW (ref >90)
Glucose, Bld: 197 mg/dL — ABNORMAL HIGH (ref 70–99)
Potassium: 4.5 mEq/L (ref 3.5–5.1)

## 2011-06-05 MED ORDER — SODIUM CHLORIDE 0.9 % IV BOLUS (SEPSIS)
1000.0000 mL | Freq: Once | INTRAVENOUS | Status: AC
  Administered 2011-06-05: 1000 mL via INTRAVENOUS

## 2011-06-05 NOTE — ED Notes (Signed)
MD at bedside. Dr. Patria Mane in room.

## 2011-06-05 NOTE — Progress Notes (Signed)
Subjective:    Patient ID: Christina Pittman, female    DOB: [birth year], 78 y.o.   MRN: [id number]  Altered Mental Status Associated symptoms include coughing and weakness. Pertinent negatives include no chills or fever.   Christina Pittman is a 78 y.o. female Hx multiple medical problems including DM, CVA, and recent admission to [facility] for pelvic fx and refusing to eat per Dr. Rennis Golden note.  Primary doctor Dr. Beverely Low.  Family has not called primary doctor.  Multiple recent illnesses,injuries.Feb 13th CVA -  In hospital for 14 days.  Week later, fell and had aortic aneurysm in chest - no repair.  1 week later - pneumonia, then dehydrated - hospitalized for dehydration - 2-3 days. Then hip fracture 2 weeks ago.  Several strokes in past - major stroke February 13th.  On Ultram - 3x/day.  Only taking one of the diabetes meds each day for past week, was supposed to be three times per day.  Home blood sugars - past few days elevated.  Glucose 233 this am.  No recent hypoglycemia.  Usually 100-130. Planning on taking 3x/day today.  Has wound on R foot - enlarging, and toes turning dark past  4-5 days.  [facility] Jim[facility]y [facility].  Last seen in late April.1  Has appt in 2 days. Hx of PVD - 4 blocked vessels per pt's son.  Today - here with son and daughter,  Talking different sometimes - since fell few weeks ago.  Weaker yesterday - not wanting to walk.  Sleeping more - seems more weak past few days.  Not drinking much water past few days.  Eating less yesterday. Less alert today - drifting off.    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 One of children is with her at all times.     Review of Systems  Constitutional: Negative for fever and chills.  Respiratory: Positive for cough.        Cough for past 1-2 days.  Treated for Pneumonia about 1 month ago.  Genitourinary: Positive for decreased urine volume. Negative for difficulty urinating.       Less uop recently - past month, but more today.    Not  incontinent.  Neurological: Positive for speech difficulty and weakness.  Psychiatric/Behavioral: Positive for altered mental status.       Objective:   Physical Exam  Constitutional: She appears cachectic. She is active and cooperative. No distress.  Cardiovascular: Normal rate, regular rhythm and intact distal pulses.   Murmur heard.       Hx of heart murmur.  Pulmonary/Chest: She has rales.       Few coarse breath sounds - right lower lobe.  No distress,  Abdominal: Soft. There is no tenderness.  Musculoskeletal: She exhibits no edema.       Guarded r hip to motion.  Neurological: She is alert.  Skin: She is not diaphoretic. There is erythema. There is pallor.     Psychiatric: She has a normal mood and affect.       Pleasant, cooperative.   Hx, exam, 2nd md exam and discussion of plan greater than 45 minutes. Phone call to primary md: 10 minutes.     Assessment & Plan:  Christina Pittman is a 78 y.o. female 1. Mental status alteration   2. Gangrenous toe   3. Cough   4. Volume depletion   5. DM2 (diabetes mellitus, type 2)    Complicated recent hx and hospitalizations including CVA, pelvic fracture,  volume depletion, and pneumonia.  Hx of PAD, now with gangrenous-appearing cold R distal foot, with nonhealing dorsal wound.  Subjective hx. of recent mental status change, varying glycemic control with recent med change and confusion regarding dosing.  Also with cough past 2 days, and few coarse breath sounds vs. rales RLL.    Discussed with primary provider - Dr. Beverely Low - agrees with admission for eval of R foot gangrene -vascular or ortho eval. Also needs eval of recent mental status change - will have evaluated at [facility].  Transport by private vehicle - son and dtr driving pt. understandin expressed of plan. Advised Press photographer.

## 2011-06-05 NOTE — ED Notes (Signed)
Family reports that pt has been more lethargic than  Usual. Pt broke pelvis 3 weeks ago.before pt was walking with walker before pelvic fracture. Pt has been seen at wound center for wounds on right foot. Family reports that 4 days ago pt had wounds and right foot. Since then pts foot has turned puple and has 3 black spots on feet. Black spots on side of big toe, top of 2nd and 3rd toe.

## 2011-06-05 NOTE — ED Provider Notes (Signed)
History     CSN: [id number]  Arrival date & time 06/05/11  1552   First MD Initiated Contact with Patient 06/05/11 1803      Chief Complaint  Patient presents with  . gangrene to toes     HPI The patient has had  worsening pain and discoloration of her right foot for approximately 5 days.  She was seen by vascular surgeon yesterday and told she has gangrene of her right toes and foot and that she needs an amputation of her right foot.  She was not interested in amputation.  She has no complaints at this time.  Family reports moderate decreased oral intake and request IV fluids because they believe she may be dehydrated.  The patient is a chest pain shortness of breath.  She denies abdominal pain nausea vomiting or diarrhea.  She's had no fevers or chills.  She denies spreading erythema of her right  Ankle or leg.  The patient states she would be willing to take IV fluids.  She is 78 years old.     Past Medical History  Diagnosis Date  . Arthritis   . Diabetes mellitus   . Hypertension   . Heart murmur   . Chicken pox   . Fractured pelvis 05/14/2011  . Stroke     feb 2013    Past Surgical History  Procedure Date  . Abdominal hysterectomy     Family History  Problem Relation Age of Onset  . Arthritis Mother   . Diabetes Sister   . Heart disease Brother   . Diabetes Brother     History  Substance Use Topics  . Smoking status: Never Smoker   . Smokeless tobacco: Not on file  . Alcohol Use: No    OB History    Grav Para Term Preterm Abortions TAB SAB Ect Mult Living                  Review of Systems  All other systems reviewed and are negative.    Allergies  Betadine; Codeine; Equalyte; and Neosporin  Home Medications   Current Outpatient Rx  Name Route Sig Dispense Refill  . CARVEDILOL 25 MG PO TABS Oral Take 1 tablet (25 mg total) by mouth 2 (two) times daily with a meal. 60 tablet 3  . CLOPIDOGREL BISULFATE 75 MG PO TABS Oral Take 1 tablet (75 mg  total) by mouth daily with breakfast. 30 tablet 3  . COLLAGENASE 250 UNIT/GM EX OINT Topical Apply topically daily.    . FUROSEMIDE 20 MG PO TABS Oral Take 20 mg by mouth daily.    . MEGESTROL ACETATE 40 MG/ML PO SUSP  2 ML TID 240 mL 3  . NATEGLINIDE 60 MG PO TABS Oral Take 1 tablet (60 mg total) by mouth 3 (three) times daily before meals. 90 tablet 3  . SIMVASTATIN 20 MG PO TABS Oral Take 1 tablet (20 mg total) by mouth daily at 6 PM. 30 tablet 3  . TRAMADOL HCL 50 MG PO TABS Oral Take 1 tablet (50 mg total) by mouth every 8 (eight) hours as needed for pain. 30 tablet 1    BP 112/60  Pulse 73  Temp(Src) 98.5 F (36.9 C) (Oral)  Resp 18  SpO2 100%  Physical Exam  Nursing note and vitals reviewed. Constitutional: She is oriented to person, place, and time. She appears well-developed and well-nourished. No distress.  HENT:  Head: Normocephalic and atraumatic.  Eyes: EOM are normal.  Neck: Normal range of motion.  Cardiovascular: Normal rate, regular rhythm and normal heart sounds.   Pulmonary/Chest: Effort normal and breath sounds normal.  Abdominal: Soft. She exhibits no distension. There is no tenderness.  Musculoskeletal: Normal range of motion.       Dry gangrene of right distal foot and toes with a large ulceration overlying the anterior aspect.  No secondary signs of infection.  The foot is blue and cool  Neurological: She is alert and oriented to person, place, and time.  Skin: Skin is warm and dry.  Psychiatric: She has a normal mood and affect. Judgment normal.    ED Course  Procedures (including critical care time)  Labs Reviewed  CBC - Abnormal; Notable for the following:    WBC 13.7 (*)    Hemoglobin 11.7 (*)    HCT 35.0 (*)    All other components within normal limits  BASIC METABOLIC PANEL - Abnormal; Notable for the following:    Sodium 130 (*)    Glucose, Bld 197 (*)    BUN 26 (*)    GFR calc non Af Amer 50 (*)    GFR calc Af Amer 58 (*)    All other  components within normal limits   No results found.   1. Gangrene of foot       MDM  The patient has had an worsening pain and discoloration of her right foot for approximately 5 days.  She was seen by vascular surgeon yesterday and told she has gangrene of her right toes and foot and that she needs an amputation of her right foot.  She was not interested in amputation.  IV fluids were given at the family's request.  After further discussions the patient after hearing that her toe might fall off decided that she was interested in having an amputation.  I recommended that she call her vascular surgeon in [place] who is at stated in amputation and calling him for followup tomorrow morning.         Lyanne Co, MD 06/05/11 [phone]

## 2011-06-05 NOTE — ED Notes (Signed)
Sent here from Dr. Rennis Golden office at [facility] to be seen for gangrene of right toes. Family stated started three days ago,  Has fx pelvis-- seen at [facility] 3weeks ago. Had CVA Mar 13, 2011 seen at [facility]. Lives alone in own house, but has had family staying with her around the clock since CVA

## 2011-06-06 ENCOUNTER — Telehealth: Payer: Self-pay | Admitting: *Deleted

## 2011-06-06 DIAGNOSIS — R1310 Dysphagia, unspecified: Secondary | ICD-10-CM | POA: Diagnosis not present

## 2011-06-06 DIAGNOSIS — IMO0001 Reserved for inherently not codable concepts without codable children: Secondary | ICD-10-CM | POA: Diagnosis not present

## 2011-06-06 DIAGNOSIS — S78119A Complete traumatic amputation at level between unspecified hip and knee, initial encounter: Secondary | ICD-10-CM | POA: Diagnosis not present

## 2011-06-06 DIAGNOSIS — I743 Embolism and thrombosis of arteries of the lower extremities: Secondary | ICD-10-CM | POA: Diagnosis not present

## 2011-06-06 DIAGNOSIS — S329XXA Fracture of unspecified parts of lumbosacral spine and pelvis, initial encounter for closed fracture: Secondary | ICD-10-CM | POA: Diagnosis not present

## 2011-06-06 DIAGNOSIS — Z79899 Other long term (current) drug therapy: Secondary | ICD-10-CM | POA: Diagnosis not present

## 2011-06-06 DIAGNOSIS — Z86718 Personal history of other venous thrombosis and embolism: Secondary | ICD-10-CM | POA: Diagnosis not present

## 2011-06-06 DIAGNOSIS — I872 Venous insufficiency (chronic) (peripheral): Secondary | ICD-10-CM | POA: Diagnosis present

## 2011-06-06 DIAGNOSIS — Z4789 Encounter for other orthopedic aftercare: Secondary | ICD-10-CM | POA: Diagnosis not present

## 2011-06-06 DIAGNOSIS — I739 Peripheral vascular disease, unspecified: Secondary | ICD-10-CM | POA: Diagnosis not present

## 2011-06-06 DIAGNOSIS — H269 Unspecified cataract: Secondary | ICD-10-CM | POA: Diagnosis not present

## 2011-06-06 DIAGNOSIS — R079 Chest pain, unspecified: Secondary | ICD-10-CM | POA: Diagnosis not present

## 2011-06-06 DIAGNOSIS — M79609 Pain in unspecified limb: Secondary | ICD-10-CM | POA: Diagnosis not present

## 2011-06-06 DIAGNOSIS — Z888 Allergy status to other drugs, medicaments and biological substances status: Secondary | ICD-10-CM | POA: Diagnosis not present

## 2011-06-06 DIAGNOSIS — M6281 Muscle weakness (generalized): Secondary | ICD-10-CM | POA: Diagnosis not present

## 2011-06-06 DIAGNOSIS — I10 Essential (primary) hypertension: Secondary | ICD-10-CM | POA: Diagnosis not present

## 2011-06-06 DIAGNOSIS — E1169 Type 2 diabetes mellitus with other specified complication: Secondary | ICD-10-CM | POA: Diagnosis not present

## 2011-06-06 DIAGNOSIS — Z7902 Long term (current) use of antithrombotics/antiplatelets: Secondary | ICD-10-CM | POA: Diagnosis not present

## 2011-06-06 DIAGNOSIS — R269 Unspecified abnormalities of gait and mobility: Secondary | ICD-10-CM | POA: Diagnosis not present

## 2011-06-06 DIAGNOSIS — Z8673 Personal history of transient ischemic attack (TIA), and cerebral infarction without residual deficits: Secondary | ICD-10-CM | POA: Diagnosis not present

## 2011-06-06 DIAGNOSIS — R0789 Other chest pain: Secondary | ICD-10-CM | POA: Diagnosis not present

## 2011-06-06 DIAGNOSIS — F17200 Nicotine dependence, unspecified, uncomplicated: Secondary | ICD-10-CM | POA: Diagnosis present

## 2011-06-06 DIAGNOSIS — I69919 Unspecified symptoms and signs involving cognitive functions following unspecified cerebrovascular disease: Secondary | ICD-10-CM | POA: Diagnosis not present

## 2011-06-06 DIAGNOSIS — Z883 Allergy status to other anti-infective agents status: Secondary | ICD-10-CM | POA: Diagnosis not present

## 2011-06-06 DIAGNOSIS — I96 Gangrene, not elsewhere classified: Secondary | ICD-10-CM | POA: Diagnosis present

## 2011-06-06 DIAGNOSIS — L97509 Non-pressure chronic ulcer of other part of unspecified foot with unspecified severity: Secondary | ICD-10-CM | POA: Diagnosis not present

## 2011-06-06 DIAGNOSIS — R489 Unspecified symbolic dysfunctions: Secondary | ICD-10-CM | POA: Diagnosis not present

## 2011-06-06 DIAGNOSIS — I70269 Atherosclerosis of native arteries of extremities with gangrene, unspecified extremity: Secondary | ICD-10-CM | POA: Diagnosis not present

## 2011-06-06 DIAGNOSIS — E1159 Type 2 diabetes mellitus with other circulatory complications: Secondary | ICD-10-CM | POA: Diagnosis not present

## 2011-06-06 DIAGNOSIS — R0602 Shortness of breath: Secondary | ICD-10-CM | POA: Diagnosis not present

## 2011-06-06 DIAGNOSIS — M6250 Muscle wasting and atrophy, not elsewhere classified, unspecified site: Secondary | ICD-10-CM | POA: Diagnosis not present

## 2011-06-06 DIAGNOSIS — R6889 Other general symptoms and signs: Secondary | ICD-10-CM | POA: Diagnosis not present

## 2011-06-06 DIAGNOSIS — R293 Abnormal posture: Secondary | ICD-10-CM | POA: Diagnosis not present

## 2011-06-06 NOTE — Telephone Encounter (Signed)
Called daughter [name] to get an update on pt condition per noted pt was advised yesterday during [facility] office visit that she has need for a foot/toe amputation and the UC MD was advised by family that pt Vascular Surgeon from [place] will have a follow up with pt to discuss the next steps, need to make sure an apt has been made with VS per this concern needs to be addresses asap, left vm to call office

## 2011-06-06 NOTE — Telephone Encounter (Signed)
.  left message to have patient return my call for gail

## 2011-06-07 ENCOUNTER — Encounter (HOSPITAL_BASED_OUTPATIENT_CLINIC_OR_DEPARTMENT_OTHER): Payer: Medicare Other | Attending: General Surgery

## 2011-06-07 DIAGNOSIS — I96 Gangrene, not elsewhere classified: Secondary | ICD-10-CM | POA: Diagnosis not present

## 2011-06-07 DIAGNOSIS — I739 Peripheral vascular disease, unspecified: Secondary | ICD-10-CM | POA: Diagnosis not present

## 2011-06-07 NOTE — Telephone Encounter (Signed)
[name] returned my call to advise that pt VS Janene Madeira in [place] has sent the pt to have her foot surgery today, pt was admitted last night and the plan is for her to stay in the hospital for at least 5 days after her surgery and then the next step is for the pt to go to a nursing home while she heals, this nurse advised pt daughter (per verbal order from MD Beverely Low) that while the pt is in the nursing home she will need to switch her primary care to the facility physician per it will be too hard for the pt to travel back and forth to our office, pt daughter agreed and will set that up for pt when this step takes place, we wish pt happy birthday per it is today. Pt daughter understood all instructions and MD Tabori notified on all new information regarding pt.

## 2011-06-14 DIAGNOSIS — E119 Type 2 diabetes mellitus without complications: Secondary | ICD-10-CM | POA: Diagnosis not present

## 2011-06-14 DIAGNOSIS — Z79899 Other long term (current) drug therapy: Secondary | ICD-10-CM | POA: Diagnosis not present

## 2011-06-14 DIAGNOSIS — S91009A Unspecified open wound, unspecified ankle, initial encounter: Secondary | ICD-10-CM | POA: Diagnosis not present

## 2011-06-14 DIAGNOSIS — T1490XA Injury, unspecified, initial encounter: Secondary | ICD-10-CM | POA: Diagnosis not present

## 2011-06-14 DIAGNOSIS — R489 Unspecified symbolic dysfunctions: Secondary | ICD-10-CM | POA: Diagnosis not present

## 2011-06-14 DIAGNOSIS — Z593 Problems related to living in residential institution: Secondary | ICD-10-CM | POA: Diagnosis not present

## 2011-06-14 DIAGNOSIS — M6281 Muscle weakness (generalized): Secondary | ICD-10-CM | POA: Diagnosis not present

## 2011-06-14 DIAGNOSIS — S88119A Complete traumatic amputation at level between knee and ankle, unspecified lower leg, initial encounter: Secondary | ICD-10-CM | POA: Diagnosis not present

## 2011-06-14 DIAGNOSIS — M79609 Pain in unspecified limb: Secondary | ICD-10-CM | POA: Diagnosis not present

## 2011-06-14 DIAGNOSIS — I739 Peripheral vascular disease, unspecified: Secondary | ICD-10-CM | POA: Diagnosis not present

## 2011-06-14 DIAGNOSIS — R269 Unspecified abnormalities of gait and mobility: Secondary | ICD-10-CM | POA: Diagnosis not present

## 2011-06-14 DIAGNOSIS — Z7901 Long term (current) use of anticoagulants: Secondary | ICD-10-CM | POA: Diagnosis not present

## 2011-06-14 DIAGNOSIS — M25569 Pain in unspecified knee: Secondary | ICD-10-CM | POA: Diagnosis not present

## 2011-06-14 DIAGNOSIS — R293 Abnormal posture: Secondary | ICD-10-CM | POA: Diagnosis not present

## 2011-06-14 DIAGNOSIS — R9431 Abnormal electrocardiogram [ECG] [EKG]: Secondary | ICD-10-CM | POA: Diagnosis not present

## 2011-06-14 DIAGNOSIS — S78119A Complete traumatic amputation at level between unspecified hip and knee, initial encounter: Secondary | ICD-10-CM | POA: Diagnosis not present

## 2011-06-14 DIAGNOSIS — Z043 Encounter for examination and observation following other accident: Secondary | ICD-10-CM | POA: Diagnosis not present

## 2011-06-14 DIAGNOSIS — R6889 Other general symptoms and signs: Secondary | ICD-10-CM | POA: Diagnosis not present

## 2011-06-14 DIAGNOSIS — S0990XA Unspecified injury of head, initial encounter: Secondary | ICD-10-CM | POA: Diagnosis not present

## 2011-06-14 DIAGNOSIS — R404 Transient alteration of awareness: Secondary | ICD-10-CM | POA: Diagnosis not present

## 2011-06-14 DIAGNOSIS — I96 Gangrene, not elsewhere classified: Secondary | ICD-10-CM | POA: Diagnosis not present

## 2011-06-14 DIAGNOSIS — E785 Hyperlipidemia, unspecified: Secondary | ICD-10-CM | POA: Diagnosis not present

## 2011-06-14 DIAGNOSIS — I6789 Other cerebrovascular disease: Secondary | ICD-10-CM | POA: Diagnosis not present

## 2011-06-14 DIAGNOSIS — S0180XA Unspecified open wound of other part of head, initial encounter: Secondary | ICD-10-CM | POA: Diagnosis not present

## 2011-06-14 DIAGNOSIS — I10 Essential (primary) hypertension: Secondary | ICD-10-CM | POA: Diagnosis not present

## 2011-06-14 DIAGNOSIS — Z4789 Encounter for other orthopedic aftercare: Secondary | ICD-10-CM | POA: Diagnosis not present

## 2011-06-14 DIAGNOSIS — R1310 Dysphagia, unspecified: Secondary | ICD-10-CM | POA: Diagnosis not present

## 2011-06-14 DIAGNOSIS — E559 Vitamin D deficiency, unspecified: Secondary | ICD-10-CM | POA: Diagnosis not present

## 2011-06-14 DIAGNOSIS — M545 Low back pain: Secondary | ICD-10-CM | POA: Diagnosis not present

## 2011-06-14 DIAGNOSIS — M6250 Muscle wasting and atrophy, not elsewhere classified, unspecified site: Secondary | ICD-10-CM | POA: Diagnosis not present

## 2011-06-14 DIAGNOSIS — I69919 Unspecified symptoms and signs involving cognitive functions following unspecified cerebrovascular disease: Secondary | ICD-10-CM | POA: Diagnosis not present

## 2011-06-14 DIAGNOSIS — IMO0002 Reserved for concepts with insufficient information to code with codable children: Secondary | ICD-10-CM | POA: Diagnosis not present

## 2011-06-14 DIAGNOSIS — Z9181 History of falling: Secondary | ICD-10-CM | POA: Diagnosis not present

## 2011-06-14 DIAGNOSIS — S51809A Unspecified open wound of unspecified forearm, initial encounter: Secondary | ICD-10-CM | POA: Diagnosis not present

## 2011-06-14 DIAGNOSIS — M810 Age-related osteoporosis without current pathological fracture: Secondary | ICD-10-CM | POA: Diagnosis not present

## 2011-06-14 DIAGNOSIS — F0390 Unspecified dementia without behavioral disturbance: Secondary | ICD-10-CM | POA: Diagnosis not present

## 2011-06-14 DIAGNOSIS — S41109A Unspecified open wound of unspecified upper arm, initial encounter: Secondary | ICD-10-CM | POA: Diagnosis not present

## 2011-06-14 DIAGNOSIS — R51 Headache: Secondary | ICD-10-CM | POA: Diagnosis not present

## 2011-06-16 DIAGNOSIS — S0180XA Unspecified open wound of other part of head, initial encounter: Secondary | ICD-10-CM | POA: Diagnosis not present

## 2011-06-16 DIAGNOSIS — E119 Type 2 diabetes mellitus without complications: Secondary | ICD-10-CM | POA: Diagnosis not present

## 2011-06-16 DIAGNOSIS — R51 Headache: Secondary | ICD-10-CM | POA: Diagnosis not present

## 2011-06-16 DIAGNOSIS — S51809A Unspecified open wound of unspecified forearm, initial encounter: Secondary | ICD-10-CM | POA: Diagnosis not present

## 2011-06-16 DIAGNOSIS — S41109A Unspecified open wound of unspecified upper arm, initial encounter: Secondary | ICD-10-CM | POA: Diagnosis not present

## 2011-06-16 DIAGNOSIS — Z7901 Long term (current) use of anticoagulants: Secondary | ICD-10-CM | POA: Diagnosis not present

## 2011-06-16 DIAGNOSIS — Z043 Encounter for examination and observation following other accident: Secondary | ICD-10-CM | POA: Diagnosis not present

## 2011-06-16 DIAGNOSIS — Z79899 Other long term (current) drug therapy: Secondary | ICD-10-CM | POA: Diagnosis not present

## 2011-06-16 DIAGNOSIS — S91009A Unspecified open wound, unspecified ankle, initial encounter: Secondary | ICD-10-CM | POA: Diagnosis not present

## 2011-06-17 DIAGNOSIS — M6250 Muscle wasting and atrophy, not elsewhere classified, unspecified site: Secondary | ICD-10-CM | POA: Diagnosis not present

## 2011-06-17 DIAGNOSIS — R269 Unspecified abnormalities of gait and mobility: Secondary | ICD-10-CM | POA: Diagnosis not present

## 2011-06-17 DIAGNOSIS — I739 Peripheral vascular disease, unspecified: Secondary | ICD-10-CM | POA: Diagnosis not present

## 2011-06-17 DIAGNOSIS — M6281 Muscle weakness (generalized): Secondary | ICD-10-CM | POA: Diagnosis not present

## 2011-06-17 DIAGNOSIS — E119 Type 2 diabetes mellitus without complications: Secondary | ICD-10-CM | POA: Diagnosis not present

## 2011-06-17 DIAGNOSIS — I10 Essential (primary) hypertension: Secondary | ICD-10-CM | POA: Diagnosis not present

## 2011-06-19 DIAGNOSIS — M25569 Pain in unspecified knee: Secondary | ICD-10-CM | POA: Diagnosis not present

## 2011-06-21 DIAGNOSIS — M79609 Pain in unspecified limb: Secondary | ICD-10-CM | POA: Diagnosis not present

## 2011-06-25 DIAGNOSIS — M79609 Pain in unspecified limb: Secondary | ICD-10-CM | POA: Diagnosis not present

## 2011-06-25 DIAGNOSIS — R269 Unspecified abnormalities of gait and mobility: Secondary | ICD-10-CM | POA: Diagnosis not present

## 2011-06-26 DIAGNOSIS — M810 Age-related osteoporosis without current pathological fracture: Secondary | ICD-10-CM | POA: Diagnosis not present

## 2011-06-26 DIAGNOSIS — I96 Gangrene, not elsewhere classified: Secondary | ICD-10-CM | POA: Diagnosis not present

## 2011-06-26 DIAGNOSIS — Z9181 History of falling: Secondary | ICD-10-CM | POA: Diagnosis not present

## 2011-07-02 DIAGNOSIS — M545 Low back pain: Secondary | ICD-10-CM | POA: Diagnosis not present

## 2011-07-02 DIAGNOSIS — R269 Unspecified abnormalities of gait and mobility: Secondary | ICD-10-CM | POA: Diagnosis not present

## 2011-07-03 DIAGNOSIS — I6789 Other cerebrovascular disease: Secondary | ICD-10-CM | POA: Diagnosis not present

## 2011-07-03 DIAGNOSIS — E559 Vitamin D deficiency, unspecified: Secondary | ICD-10-CM | POA: Diagnosis not present

## 2011-07-03 DIAGNOSIS — M6250 Muscle wasting and atrophy, not elsewhere classified, unspecified site: Secondary | ICD-10-CM | POA: Diagnosis not present

## 2011-07-03 DIAGNOSIS — I10 Essential (primary) hypertension: Secondary | ICD-10-CM | POA: Diagnosis not present

## 2011-07-03 DIAGNOSIS — E785 Hyperlipidemia, unspecified: Secondary | ICD-10-CM | POA: Diagnosis not present

## 2011-07-05 DIAGNOSIS — E119 Type 2 diabetes mellitus without complications: Secondary | ICD-10-CM | POA: Diagnosis not present

## 2011-07-05 DIAGNOSIS — Z48812 Encounter for surgical aftercare following surgery on the circulatory system: Secondary | ICD-10-CM | POA: Diagnosis not present

## 2011-07-05 DIAGNOSIS — I70209 Unspecified atherosclerosis of native arteries of extremities, unspecified extremity: Secondary | ICD-10-CM | POA: Diagnosis not present

## 2011-07-05 DIAGNOSIS — I69991 Dysphagia following unspecified cerebrovascular disease: Secondary | ICD-10-CM | POA: Diagnosis not present

## 2011-07-05 DIAGNOSIS — R1312 Dysphagia, oropharyngeal phase: Secondary | ICD-10-CM | POA: Diagnosis not present

## 2011-07-08 DIAGNOSIS — I70209 Unspecified atherosclerosis of native arteries of extremities, unspecified extremity: Secondary | ICD-10-CM | POA: Diagnosis not present

## 2011-07-08 DIAGNOSIS — Z48812 Encounter for surgical aftercare following surgery on the circulatory system: Secondary | ICD-10-CM | POA: Diagnosis not present

## 2011-07-08 DIAGNOSIS — I69991 Dysphagia following unspecified cerebrovascular disease: Secondary | ICD-10-CM | POA: Diagnosis not present

## 2011-07-08 DIAGNOSIS — R1312 Dysphagia, oropharyngeal phase: Secondary | ICD-10-CM | POA: Diagnosis not present

## 2011-07-08 DIAGNOSIS — E119 Type 2 diabetes mellitus without complications: Secondary | ICD-10-CM | POA: Diagnosis not present

## 2011-07-10 DIAGNOSIS — R1312 Dysphagia, oropharyngeal phase: Secondary | ICD-10-CM | POA: Diagnosis not present

## 2011-07-10 DIAGNOSIS — Z48812 Encounter for surgical aftercare following surgery on the circulatory system: Secondary | ICD-10-CM | POA: Diagnosis not present

## 2011-07-10 DIAGNOSIS — E119 Type 2 diabetes mellitus without complications: Secondary | ICD-10-CM | POA: Diagnosis not present

## 2011-07-10 DIAGNOSIS — I69991 Dysphagia following unspecified cerebrovascular disease: Secondary | ICD-10-CM | POA: Diagnosis not present

## 2011-07-10 DIAGNOSIS — I70209 Unspecified atherosclerosis of native arteries of extremities, unspecified extremity: Secondary | ICD-10-CM | POA: Diagnosis not present

## 2011-07-11 DIAGNOSIS — Z48812 Encounter for surgical aftercare following surgery on the circulatory system: Secondary | ICD-10-CM | POA: Diagnosis not present

## 2011-07-11 DIAGNOSIS — E119 Type 2 diabetes mellitus without complications: Secondary | ICD-10-CM | POA: Diagnosis not present

## 2011-07-11 DIAGNOSIS — I69991 Dysphagia following unspecified cerebrovascular disease: Secondary | ICD-10-CM | POA: Diagnosis not present

## 2011-07-11 DIAGNOSIS — R1312 Dysphagia, oropharyngeal phase: Secondary | ICD-10-CM | POA: Diagnosis not present

## 2011-07-11 DIAGNOSIS — I70209 Unspecified atherosclerosis of native arteries of extremities, unspecified extremity: Secondary | ICD-10-CM | POA: Diagnosis not present

## 2011-07-12 ENCOUNTER — Encounter (HOSPITAL_COMMUNITY): Payer: Self-pay | Admitting: *Deleted

## 2011-07-12 ENCOUNTER — Telehealth: Payer: Self-pay | Admitting: *Deleted

## 2011-07-12 ENCOUNTER — Emergency Department (HOSPITAL_COMMUNITY)
Admission: EM | Admit: 2011-07-12 | Discharge: 2011-07-12 | Disposition: A | Discharge status: Home / Self Care | Payer: Medicare Other | Attending: Emergency Medicine | Admitting: Emergency Medicine

## 2011-07-12 ENCOUNTER — Ambulatory Visit (INDEPENDENT_AMBULATORY_CARE_PROVIDER_SITE_OTHER): Payer: Medicare Other | Admitting: Family Medicine

## 2011-07-12 ENCOUNTER — Encounter: Payer: Self-pay | Admitting: Family Medicine

## 2011-07-12 ENCOUNTER — Encounter (HOSPITAL_BASED_OUTPATIENT_CLINIC_OR_DEPARTMENT_OTHER): Payer: Medicare Other

## 2011-07-12 VITALS — BP 110/70 | HR 95 | Temp 97.9°F | Resp 16 | Ht 63.25 in

## 2011-07-12 DIAGNOSIS — M7989 Other specified soft tissue disorders: Secondary | ICD-10-CM

## 2011-07-12 DIAGNOSIS — L02419 Cutaneous abscess of limb, unspecified: Secondary | ICD-10-CM | POA: Diagnosis not present

## 2011-07-12 DIAGNOSIS — R609 Edema, unspecified: Secondary | ICD-10-CM | POA: Insufficient documentation

## 2011-07-12 DIAGNOSIS — S78119A Complete traumatic amputation at level between unspecified hip and knee, initial encounter: Secondary | ICD-10-CM | POA: Insufficient documentation

## 2011-07-12 DIAGNOSIS — E119 Type 2 diabetes mellitus without complications: Secondary | ICD-10-CM | POA: Insufficient documentation

## 2011-07-12 DIAGNOSIS — I872 Venous insufficiency (chronic) (peripheral): Secondary | ICD-10-CM | POA: Diagnosis not present

## 2011-07-12 DIAGNOSIS — E785 Hyperlipidemia, unspecified: Secondary | ICD-10-CM | POA: Insufficient documentation

## 2011-07-12 DIAGNOSIS — I10 Essential (primary) hypertension: Secondary | ICD-10-CM | POA: Insufficient documentation

## 2011-07-12 DIAGNOSIS — L0390 Cellulitis, unspecified: Secondary | ICD-10-CM

## 2011-07-12 DIAGNOSIS — M79609 Pain in unspecified limb: Secondary | ICD-10-CM | POA: Diagnosis not present

## 2011-07-12 DIAGNOSIS — L03119 Cellulitis of unspecified part of limb: Secondary | ICD-10-CM | POA: Diagnosis not present

## 2011-07-12 HISTORY — DX: Hyperlipidemia, unspecified: E78.5

## 2011-07-12 HISTORY — DX: Benign neoplasm of pituitary gland: D35.2

## 2011-07-12 HISTORY — DX: Thoracic aortic aneurysm, without rupture, unspecified: I71.20

## 2011-07-12 HISTORY — DX: Insomnia, unspecified: G47.00

## 2011-07-12 HISTORY — DX: Venous insufficiency (chronic) (peripheral): I87.2

## 2011-07-12 HISTORY — DX: Thoracic aortic aneurysm, without rupture: I71.2

## 2011-07-12 HISTORY — DX: Hemiplegia, unspecified affecting right dominant side: G81.91

## 2011-07-12 HISTORY — DX: Dysphagia, unspecified: R13.10

## 2011-07-12 HISTORY — DX: Pneumonitis due to inhalation of food and vomit: J69.0

## 2011-07-12 LAB — DIFFERENTIAL
Basophils Relative: 0 % (ref 0–1)
Eosinophils Absolute: 0.2 10*3/uL (ref 0.0–0.7)
Monocytes Absolute: 0.9 10*3/uL (ref 0.1–1.0)
Monocytes Relative: 9 % (ref 3–12)
Neutrophils Relative %: 64 % (ref 43–77)

## 2011-07-12 LAB — CBC
HCT: 33.2 % — ABNORMAL LOW (ref 36.0–46.0)
Hemoglobin: 10.6 g/dL — ABNORMAL LOW (ref 12.0–15.0)
MCH: 29.3 pg (ref 26.0–34.0)
MCHC: 31.9 g/dL (ref 30.0–36.0)
RDW: 14.7 % (ref 11.5–15.5)

## 2011-07-12 LAB — BASIC METABOLIC PANEL
BUN: 15 mg/dL (ref 6–23)
Calcium: 8.4 mg/dL (ref 8.4–10.5)
Creatinine, Ser: 0.75 mg/dL (ref 0.50–1.10)
GFR calc Af Amer: 84 mL/min — ABNORMAL LOW (ref >90)
GFR calc non Af Amer: 72 mL/min — ABNORMAL LOW (ref >90)

## 2011-07-12 MED ORDER — CLINDAMYCIN HCL 150 MG PO CAPS: 300.0000 mg | ORAL_CAPSULE | Freq: Three times a day (TID) | ORAL | Status: AC

## 2011-07-12 NOTE — Telephone Encounter (Signed)
Noted thanks °

## 2011-07-12 NOTE — Discharge Instructions (Signed)
Cellulitis  Cellulitis is an infection of the skin and the tissue beneath it. The area is typically red and tender. It is caused by germs (bacteria) (usually staph or strep) that enter the body through cuts or sores. Cellulitis most commonly occurs in the arms or lower legs.   HOME CARE INSTRUCTIONS    If you are given a prescription for medications which kill germs (antibiotics), take as directed until finished.   If the infection is on the arm or leg, keep the limb elevated as able.   Use a warm cloth several times per day to relieve pain and encourage healing.   See your caregiver for recheck of the infected site as directed if problems arise.   Only take over-the-counter or prescription medicines for pain, discomfort, or fever as directed by your caregiver.  SEEK MEDICAL CARE IF:    The area of redness (inflammation) is spreading, there are red streaks coming from the infected site, or if a part of the infection begins to turn dark in color.   The joint or bone underneath the infected skin becomes painful after the skin has healed.   The infection returns in the same or another area after it seems to have gone away.   A boil or bump swells up. This may be an abscess.   New, unexplained problems such as pain or fever develop.  SEEK IMMEDIATE MEDICAL CARE IF:    You have a fever.   You or your child feels drowsy or lethargic.   There is vomiting, diarrhea, or lasting discomfort or feeling ill (malaise) with muscle aches and pains.  MAKE SURE YOU:    Understand these instructions.   Will watch your condition.   Will get help right away if you are not doing well or get worse.  Document Released: 10/24/2004 Document Revised: 01/03/2011 Document Reviewed: 09/02/2007  ExitCare Patient Information 2012 [employer]  Edema  Edema is an abnormal build-up of fluids in tissues. Because this is partly dependent on gravity (water flows to the lowest place), it is more common in the leg sand thighs (lower  extremities). It is also common in the looser tissues, like around the eyes. Painless swelling of the feet and ankles is common and increases as a person ages. It may affect both legs and may include the calves or even thighs. When squeezed, the fluid may move out of the affected area and may leave a dent for a few moments.  CAUSES    Prolonged standing or sitting in one place for extended periods of time. Movement helps pump tissue fluid into the veins, and absence of movement prevents this, resulting in edema.   Varicose veins. The valves in the veins do not work as well as they should. This causes fluid to leak into the tissues.   Fluid and salt overload.   Injury, burn, or surgery to the leg, ankle, or foot, may damage veins and allow fluid to leak out.   Sunburn damages vessels. Leaky vessels allow fluid to go out into the sunburned tissues.   Allergies (from insect bites or stings, medications or chemicals) cause swelling by allowing vessels to become leaky.   Protein in the blood helps keep fluid in your vessels. Low protein, as in malnutrition, allows fluid to leak out.   Hormonal changes, including pregnancy and menstruation, cause fluid retention. This fluid may leak out of vessels and cause edema.   Medications that cause fluid retention. Examples are sex hormones, blood   pressure medications, steroid treatment, or anti-depressants.   Some illnesses cause edema, especially heart failure, kidney disease, or liver disease.   Surgery that cuts veins or lymph nodes, such as surgery done for the heart or for breast cancer, may result in edema.  DIAGNOSIS   Your caregiver is usually easily able to determine what is causing your swelling (edema) by simply asking what is wrong (getting a history) and examining you (doing a physical). Sometimes x-rays, EKG (electrocardiogram or heart tracing), and blood work may be done to evaluate for underlying medical illness.  TREATMENT   General treatment  includes:   Leg elevation (or elevation of the affected body part).   Restriction of fluid intake.   Prevention of fluid overload.   Compression of the affected body part. Compression with elastic bandages or support stockings squeezes the tissues, preventing fluid from entering and forcing it back into the blood vessels.   Diuretics (also called water pills or fluid pills) pull fluid out of your body in the form of increased urination. These are effective in reducing the swelling, but can have side effects and must be used only under your caregiver's supervision. Diuretics are appropriate only for some types of edema.  The specific treatment can be directed at any underlying causes discovered. Heart, liver, or kidney disease should be treated appropriately.  HOME CARE INSTRUCTIONS    Elevate the legs (or affected body part) above the level of the heart, while lying down.   Avoid sitting or standing still for prolonged periods of time.   Avoid putting anything directly under the knees when lying down, and do not wear constricting clothing or garters on the upper legs.   Exercising the legs causes the fluid to work back into the veins and lymphatic channels. This may help the swelling go down.   The pressure applied by elastic bandages or support stockings can help reduce ankle swelling.   A low-salt diet may help reduce fluid retention and decrease the ankle swelling.   Take any medications exactly as prescribed.  SEEK MEDICAL CARE IF:   Your edema is not responding to recommended treatments.  SEEK IMMEDIATE MEDICAL CARE IF:    You develop shortness of breath or chest pain.   You cannot breathe when you lay down; or if, while lying down, you have to get up and go to the window to get your breath.   You are having increasing swelling without relief from treatment.   You develop a fever over 102 F (38.9 C).   You develop pain or redness in the areas that are swollen.   Tell your caregiver right  away if you have gained 3 lb/1.4 kg in 1 day or 5 lb/2.3 kg in a week.  MAKE SURE YOU:    Understand these instructions.   Will watch your condition.   Will get help right away if you are not doing well or get worse.  Document Released: 01/14/2005 Document Revised: 01/03/2011 Document Reviewed: 09/02/2007  [employer] Patient Information 2012 [employer].

## 2011-07-12 NOTE — ED Notes (Signed)
QMV:[id number]<NG> Expected date:07/12/11<BR> Expected time:<BR> Means of arrival:<BR> Comments:<BR> EMS 50 GC - infected surgical site

## 2011-07-12 NOTE — Telephone Encounter (Signed)
Received message from [place], [place] at [facility]. She states they evaluated pt on 07/08/11 and will be providing ADLs and strengthening for pt twice a week for 8 weeks. Call [phone] if we have any questions.

## 2011-07-12 NOTE — Progress Notes (Signed)
  Subjective:    Patient ID: Christina Pittman, female    DOB: 06/18/1919, 78 y.o.   MRN: [id number]  HPI Hospital f/u.  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  Was in rehab facility until her recent D/C.  Now L leg has been dark and swollen x3 days.  Leg is painful.  Visible fluid under the skin.  On keflex.  On Plavix.  Has extensive bruising.  No known injury.  Skin is very thin.  'am i going to lose that one, too?'.  Has been bedridden since AKA.  Lasix was stopped in rehab- no known cause (? Renal insufficiency).  Leg is warm, red, tender to touch.   Review of Systems For ROS see HPI     Objective:   Physical Exam  Vitals reviewed. Constitutional: She appears well-developed. No distress.       Frail elderly woman sitting in wheel chair  Cardiovascular: Intact distal pulses.   Musculoskeletal: She exhibits edema (L LE edema- visible fluid, almost bullous like, beneath thin skin) and tenderness.  Neurological: She is alert.  Skin: Skin is warm and dry. There is erythema (skin is red and warm to touch behind L knee and L lateral lower leg).       Extensive bruising of L lower leg w/ areas that appear black in color Toes are warm but also bruising          Assessment & Plan:

## 2011-07-12 NOTE — Progress Notes (Signed)
Left lower extremity venous duplex completed.  Preliminary report is possible DVT in the left peroneal vein.  Technically difficult study due to the patient's guarding secondary to her pain.  There appears to be a Baker's cyst.

## 2011-07-12 NOTE — ED Notes (Signed)
Pt sent from . 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 Went to SNF then home for past week. Family has noticed swelling, discoloration, and obvious signs of infection of L leg. R AKA wound also showing signs of infection. Recent CVA. Pt reported to PCP that she no longer wanted to care for self. Sent by Morrison Old MD.

## 2011-07-12 NOTE — ED Provider Notes (Addendum)
History     CSN: [id number]  Arrival date & time 07/12/11  1519   First MD Initiated Contact with Patient 07/12/11 1603      Chief Complaint  Patient presents with  . Wound Infection    (Consider location/radiation/quality/duration/timing/severity/associated sxs/prior treatment) HPI Comments: Patient is brought in with family for worsening left lower extremity swelling and bruising.  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  She was in a rehabilitation facility until a week ago.  She went into see her primary care physician today in the office and there was concerns of possible infection and the patient was sent here for evaluation.  Patient denies any fevers, nausea, vomiting or pain in her left lower leg.  There is increased swelling in the left lower leg but family also notes that the patient is previously on Lasix and did not come home from the rehabilitation center on Lasix.  They also noted various bruising marks across the left lower leg and the patient is on Plavix.  There's been no known trauma or injury.  The history is provided by the patient and a relative.    Past Medical History  Diagnosis Date  . Arthritis   . Diabetes mellitus   . Hypertension   . Heart murmur   . Chicken pox   . Fractured pelvis 05/14/2011  . Stroke     feb 2013  . Pituitary macroadenoma   . Insomnia   . Hyperlipemia   . Hemiparesis, right   . Dysphagia   . Bilateral venous insufficiency   . Aspiration pneumonia   . Aneurysm of thoracic aorta     Past Surgical History  Procedure Date  . Abdominal hysterectomy   . Above knee leg amputation     Family History  Problem Relation Age of Onset  . Arthritis Mother   . Diabetes Sister   . Heart disease Brother   . Diabetes Brother     History  Substance Use Topics  . Smoking status: Never Smoker   . Smokeless tobacco: Not on file  . Alcohol Use: No    OB History    Grav Para Term Preterm Abortions TAB  SAB Ect Mult Living                  Review of Systems  Constitutional: Negative.  Negative for fever and chills.  HENT: Negative.   Eyes: Negative.   Respiratory: Negative.  Negative for cough and shortness of breath.   Cardiovascular: Negative.  Negative for chest pain.  Gastrointestinal: Negative.  Negative for nausea, vomiting, abdominal pain and diarrhea.  Genitourinary: Negative.   Musculoskeletal: Negative for back pain.  Skin: Positive for color change and wound. Negative for rash.  Neurological: Negative.  Negative for syncope and headaches.  Hematological: Negative.  Negative for adenopathy.  Psychiatric/Behavioral: Negative.  Negative for confusion.  All other systems reviewed and are negative.    Allergies  Betadine; Codeine; Equalyte; and Neosporin  Home Medications   Current Outpatient Rx  Name Route Sig Dispense Refill  . ALENDRONATE SODIUM 70 MG PO TABS Oral Take 70 mg by mouth every 7 (seven) days. Take with a full glass of water on an empty stomach.  Taken on Wednesdays.    Marland Kitchen CALCIUM CARBONATE-VITAMIN D 500-200 MG-UNIT PO TABS Oral Take 1 tablet by mouth 2 (two) times daily.    Marland Kitchen CARVEDILOL 25 MG PO TABS Oral Take 1 tablet (25 mg total)  by mouth 2 (two) times daily with a meal. 60 tablet 3  . CEPHALEXIN 500 MG PO CAPS Oral Take 500 mg by mouth 3 (three) times daily. Take until 07/13/11.    Marland Kitchen CLOPIDOGREL BISULFATE 75 MG PO TABS Oral Take 1 tablet (75 mg total) by mouth daily with breakfast. 30 tablet 3  . ERGOCALCIFEROL 50000 UNITS PO CAPS Oral Take 50,000 Units by mouth once a week.     Marland Kitchen LIDOCAINE 5 % EX PTCH Transdermal Place 1 patch onto the skin daily. Remove & Discard patch within 12 hours or as directed by MD    . NATEGLINIDE 60 MG PO TABS Oral Take 1 tablet (60 mg total) by mouth 3 (three) times daily before meals. 90 tablet 3  . SIMVASTATIN 20 MG PO TABS Oral Take 1 tablet (20 mg total) by mouth daily at 6 PM. 30 tablet 3  . VITAMIN D 400 UNITS PO TABS  Oral Take 400 Units by mouth 2 (two) times daily.      BP 154/82  Pulse 68  Temp 97.8 F (36.6 C) (Oral)  Resp 18  SpO2 100%  Physical Exam  Nursing note and vitals reviewed. Constitutional: She is oriented to person, place, and time. She appears well-developed and well-nourished.  Non-toxic appearance. She does not have a sickly appearance.  HENT:  Head: Normocephalic and atraumatic.  Eyes: Conjunctivae, EOM and lids are normal. Pupils are equal, round, and reactive to light. No scleral icterus.  Neck: Trachea normal and normal range of motion. Neck supple.  Cardiovascular: Normal rate and regular rhythm.   Murmur heard. Pulmonary/Chest: Effort normal and breath sounds normal. No respiratory distress. She has no wheezes. She has no rales.  Abdominal: Soft. Normal appearance. There is no tenderness. There is no rebound, no guarding and no CVA tenderness.  Musculoskeletal: Normal range of motion. She exhibits edema.       Right lower extremity AKA.  Staples are still in place.  There is mild erythema around the wound but no drainage.  No focal tenderness.  Wound appears clean, dry and intact.  Left lower extremity has some swelling below the knee.  There is obvious bruising present over the lower leg.  Palpable DP pulse.  Foot is warm.  Capillary refill in the toes less than 2 seconds.  No specific erythema on exam  Neurological: She is alert and oriented to person, place, and time. She has normal strength.  Skin: Skin is warm, dry and intact. No rash noted.  Psychiatric: She has a normal mood and affect. Her behavior is normal. Judgment and thought content normal.    ED Course  Procedures (including critical care time)  Results for orders placed during the hospital encounter of 07/12/11  CBC      Component Value Range   WBC 9.2  4.0 - 10.5 K/uL   RBC 3.62 (*) 3.87 - 5.11 MIL/uL   Hemoglobin 10.6 (*) 12.0 - 15.0 g/dL   HCT 14.7 (*) 82.9 - 56.2 %   MCV 91.7  78.0 - 100.0 fL    MCH 29.3  26.0 - 34.0 pg   MCHC 31.9  30.0 - 36.0 g/dL   RDW 13.0  86.5 - 78.4 %   Platelets 276  150 - 400 K/uL  DIFFERENTIAL      Component Value Range   Neutrophils Relative 64  43 - 77 %   Neutro Abs 5.9  1.7 - 7.7 K/uL   Lymphocytes Relative 24  12 -  46 %   Lymphs Abs 2.2  0.7 - 4.0 K/uL   Monocytes Relative 9  3 - 12 %   Monocytes Absolute 0.9  0.1 - 1.0 K/uL   Eosinophils Relative 2  0 - 5 %   Eosinophils Absolute 0.2  0.0 - 0.7 K/uL   Basophils Relative 0  0 - 1 %   Basophils Absolute 0.0  0.0 - 0.1 K/uL  BASIC METABOLIC PANEL      Component Value Range   Sodium 136  135 - 145 mEq/L   Potassium 4.2  3.5 - 5.1 mEq/L   Chloride 104  96 - 112 mEq/L   CO2 26  19 - 32 mEq/L   Glucose, Bld 169 (*) 70 - 99 mg/dL   BUN 15  6 - 23 mg/dL   Creatinine, Ser 1.61  0.50 - 1.10 mg/dL   Calcium 8.4  8.4 - 09.6 mg/dL   GFR calc non Af Amer 72 (*) >90 mL/min   GFR calc Af Amer 84 (*) >90 mL/min  APTT      Component Value Range   aPTT 27  24 - 37 seconds  PROTIME-INR      Component Value Range   Prothrombin Time 15.0  11.6 - 15.2 seconds   INR 1.16  0.00 - 1.49     MDM  Patient with left lower extremity worsening swelling.  There is consideration for DVT given patient's decreased mobility with her recent right AKA.  A lower extremity Doppler has been ordered.  Patient has no fevers and does not specifically have erythema to suggest cellulitis at this time.  She is off her Lasix and I will check her renal function as some of her swelling may be related to the decreased diuretic.    Nat Christen, MD 07/12/11 1625  Patient with no leukocytosis or fever here.  Platelet count is normal.  INR is not elevated.  Patient now tells me that she's been on Keflex over the last week.  We will adjust her antibiotics as she has some upper leg redness that is unclear if this is truly related to infection versus or bruising.  As the patient is systemically well I feel she is still safe for  discharge home with followup with her primary care physician next week.  She shows no signs of vascular insufficiency on my exam.  She also has followup with her vascular surgeon next week.  Patient's lower extremity Doppler showed a possible question of a peroneal vein DVT which would be small and insignificant at this point in time and so I believe can be followed up as an outpatient as well.  Nat Christen, MD 07/12/11 [id number]

## 2011-07-15 DIAGNOSIS — R1312 Dysphagia, oropharyngeal phase: Secondary | ICD-10-CM | POA: Diagnosis not present

## 2011-07-15 DIAGNOSIS — I70209 Unspecified atherosclerosis of native arteries of extremities, unspecified extremity: Secondary | ICD-10-CM | POA: Diagnosis not present

## 2011-07-15 DIAGNOSIS — Z48812 Encounter for surgical aftercare following surgery on the circulatory system: Secondary | ICD-10-CM | POA: Diagnosis not present

## 2011-07-15 DIAGNOSIS — E119 Type 2 diabetes mellitus without complications: Secondary | ICD-10-CM | POA: Diagnosis not present

## 2011-07-15 DIAGNOSIS — I69991 Dysphagia following unspecified cerebrovascular disease: Secondary | ICD-10-CM | POA: Diagnosis not present

## 2011-07-16 ENCOUNTER — Telehealth: Payer: Self-pay | Admitting: *Deleted

## 2011-07-16 NOTE — Assessment & Plan Note (Signed)
New.  Extensive edema of L LE.  Lasix was stopped at rehab w/out explanation.  Possible renal insufficiency.  Due to tenderness and redness must consider infxn, DVT.  Pt needs additional evaluation in ER to determine cause.  Pt and daughter in agreement w/ this after initial refusal.

## 2011-07-16 NOTE — Assessment & Plan Note (Signed)
Deteriorated.  Pt w/ extensive bruising, swelling and redness of L LE (see edema).  Pt fearful of losing L leg.  Will send to ER for complete evaluation and tx.

## 2011-07-16 NOTE — Telephone Encounter (Signed)
Received call from MD Dennanon nurse Beth whom advised that they received the DVT scan notes/test and MD Denannon advised that per the location of the DVT noted in peroneal vein they have scheduled pt to have another scan and also have pt follow up with MD Denonnan in 2 weeks per he noted a small concern with pt stump, notes MD does not want pt on the anticoagulant therapy at this time per does not want any propegetes and notes too many concerns to place pt on anticoagulant therapy at this time

## 2011-07-17 DIAGNOSIS — Z48812 Encounter for surgical aftercare following surgery on the circulatory system: Secondary | ICD-10-CM | POA: Diagnosis not present

## 2011-07-17 DIAGNOSIS — R1312 Dysphagia, oropharyngeal phase: Secondary | ICD-10-CM | POA: Diagnosis not present

## 2011-07-17 DIAGNOSIS — I69991 Dysphagia following unspecified cerebrovascular disease: Secondary | ICD-10-CM | POA: Diagnosis not present

## 2011-07-17 DIAGNOSIS — E119 Type 2 diabetes mellitus without complications: Secondary | ICD-10-CM | POA: Diagnosis not present

## 2011-07-17 DIAGNOSIS — I70209 Unspecified atherosclerosis of native arteries of extremities, unspecified extremity: Secondary | ICD-10-CM | POA: Diagnosis not present

## 2011-07-17 NOTE — Telephone Encounter (Signed)
Noted.  Agree w/ holding anti-coag due to fall risk and fresh surgery

## 2011-07-18 ENCOUNTER — Ambulatory Visit (INDEPENDENT_AMBULATORY_CARE_PROVIDER_SITE_OTHER): Payer: Medicare Other | Admitting: Family Medicine

## 2011-07-18 VITALS — BP 135/80 | HR 63 | Temp 97.8°F

## 2011-07-18 DIAGNOSIS — R1312 Dysphagia, oropharyngeal phase: Secondary | ICD-10-CM | POA: Diagnosis not present

## 2011-07-18 DIAGNOSIS — I70209 Unspecified atherosclerosis of native arteries of extremities, unspecified extremity: Secondary | ICD-10-CM | POA: Diagnosis not present

## 2011-07-18 DIAGNOSIS — E119 Type 2 diabetes mellitus without complications: Secondary | ICD-10-CM

## 2011-07-18 DIAGNOSIS — I824Z9 Acute embolism and thrombosis of unspecified deep veins of unspecified distal lower extremity: Secondary | ICD-10-CM

## 2011-07-18 DIAGNOSIS — I69991 Dysphagia following unspecified cerebrovascular disease: Secondary | ICD-10-CM | POA: Diagnosis not present

## 2011-07-18 DIAGNOSIS — I82459 Acute embolism and thrombosis of unspecified peroneal vein: Secondary | ICD-10-CM

## 2011-07-18 DIAGNOSIS — R609 Edema, unspecified: Secondary | ICD-10-CM

## 2011-07-18 DIAGNOSIS — Z48812 Encounter for surgical aftercare following surgery on the circulatory system: Secondary | ICD-10-CM | POA: Diagnosis not present

## 2011-07-18 LAB — HEMOGLOBIN A1C: Hgb A1c MFr Bld: 5.7 % (ref 4.6–6.5)

## 2011-07-18 MED ORDER — FUROSEMIDE 20 MG PO TABS: 20.0000 mg | ORAL_TABLET | Freq: Every day | ORAL | Status: DC

## 2011-07-18 NOTE — Patient Instructions (Addendum)
Follow up 3 months- sooner if needed Take the Lasix if you know you are travelling and then when swollen Your med list is correct Call with any questions or concerns Hang in there!!!

## 2011-07-18 NOTE — Progress Notes (Signed)
  Subjective:    Patient ID: Christina Pittman, female    DOB: 05/24/1919, 78 y.o.   MRN: [id number]  HPI ER F/U- went to vascular surgery on Tuesday.  Had staples removed.  Family reports, 'he said it was feeling good'.  + peroneal DVT on L leg.  Not currently anticoagulated- is high risk due to falls and recent surgery.  Has f/u scheduled 7/9.  Pt has home health nursing coming once weekly and therapy is 2x/week.  Was told to leave leg wrapped until return visit.  Lasix was stopped at Rehab w/out obvious reason.  Family reports leg tends to swell when riding in the car or has leg hanging down.  Would like med list clarified.  Pt reports feeling 'pretty good'.  Denies pain, bleeding, oozing from leg.   Review of Systems For ROS see HPI     Objective:   Physical Exam  Vitals reviewed. Constitutional: She is oriented to person, place, and time. No distress (sitting quietly in wheelchair).  Musculoskeletal: She exhibits edema (1+ edema of L lower leg but no visible bullae, no oozing.).  Neurological: She is alert and oriented to person, place, and time.  Skin: Skin is warm and dry. There is erythema (but much improved from last visit).          Assessment & Plan:

## 2011-07-19 ENCOUNTER — Encounter: Payer: Self-pay | Admitting: *Deleted

## 2011-07-19 ENCOUNTER — Ambulatory Visit: Payer: Medicare Other | Admitting: Family Medicine

## 2011-07-22 DIAGNOSIS — E119 Type 2 diabetes mellitus without complications: Secondary | ICD-10-CM | POA: Diagnosis not present

## 2011-07-22 DIAGNOSIS — R1312 Dysphagia, oropharyngeal phase: Secondary | ICD-10-CM | POA: Diagnosis not present

## 2011-07-22 DIAGNOSIS — I70209 Unspecified atherosclerosis of native arteries of extremities, unspecified extremity: Secondary | ICD-10-CM | POA: Diagnosis not present

## 2011-07-22 DIAGNOSIS — I69991 Dysphagia following unspecified cerebrovascular disease: Secondary | ICD-10-CM | POA: Diagnosis not present

## 2011-07-22 DIAGNOSIS — Z48812 Encounter for surgical aftercare following surgery on the circulatory system: Secondary | ICD-10-CM | POA: Diagnosis not present

## 2011-07-23 ENCOUNTER — Telehealth: Payer: Self-pay | Admitting: Family Medicine

## 2011-07-23 MED ORDER — REPAGLINIDE 0.5 MG PO TABS: 0.5000 mg | ORAL_TABLET | Freq: Three times a day (TID) | ORAL | Status: DC

## 2011-07-23 NOTE — Telephone Encounter (Signed)
Can switch to Prandin 0.5mg  TID, #90, 3 refills (not sure if this will be cheaper or not)

## 2011-07-23 NOTE — Telephone Encounter (Signed)
Sent new medication to pharmacy, left message to advise new medication has been sent

## 2011-07-23 NOTE — Telephone Encounter (Signed)
Please advise 

## 2011-07-23 NOTE — Telephone Encounter (Signed)
stralix is to expensive with the patient, per fax recv from [facility]. They want to know if we can send something else to them that may be cheaper for the patient Please review and advise [id number] ph# [phone], faxx [phone]

## 2011-07-24 DIAGNOSIS — E119 Type 2 diabetes mellitus without complications: Secondary | ICD-10-CM | POA: Diagnosis not present

## 2011-07-24 DIAGNOSIS — R1312 Dysphagia, oropharyngeal phase: Secondary | ICD-10-CM | POA: Diagnosis not present

## 2011-07-24 DIAGNOSIS — Z48812 Encounter for surgical aftercare following surgery on the circulatory system: Secondary | ICD-10-CM | POA: Diagnosis not present

## 2011-07-24 DIAGNOSIS — I69991 Dysphagia following unspecified cerebrovascular disease: Secondary | ICD-10-CM | POA: Diagnosis not present

## 2011-07-24 DIAGNOSIS — I70209 Unspecified atherosclerosis of native arteries of extremities, unspecified extremity: Secondary | ICD-10-CM | POA: Diagnosis not present

## 2011-07-24 MED ORDER — ALENDRONATE SODIUM 70 MG PO TABS: 70.0000 mg | ORAL_TABLET | ORAL | Status: DC

## 2011-07-24 MED ORDER — GLIMEPIRIDE 2 MG PO TABS: 2.0000 mg | ORAL_TABLET | Freq: Every day | ORAL | Status: AC

## 2011-07-24 NOTE — Telephone Encounter (Signed)
Called home number left vm, called mobile and noted unable to leave a vm at this time, called work number and debbie advised that she sits with the pt however not on DPR, advised debbie to have family call office asap, debbie advised she will let the family know

## 2011-07-24 NOTE — Telephone Encounter (Signed)
Please review prior phone note and advise

## 2011-07-24 NOTE — Telephone Encounter (Addendum)
Received call from [facility] who noted that the pt will go with taking the glimipride 2mg  daily prior to her breakfest, this nurse advised EXTREME IMPORTANCE THAT THE PT BLOOD SUGAR NEEDS TO BE CHECKED PRIOR TO TAKING THIS MEDICATION, IF THE BLOOD SUGAR TEST IS BELOW 90 IN THE MORNING DO NOT GIVE THE MEDICATION, as well as if the pt does take the medication she needs to make sure she eats,  Pt daughter understood and advised Debbie whom is taking care of the pt in the morning as to the strict medication instructions, pt daughter also questioned if the pt needs to continue to take Fosamax per needs a refill, advised per MD Beverely Low she does need to continue the medication, pt daughter understood all instructions and has advised the pt about dangerous concern of taking this medication if her CBG(blood sugar) is too low, pt daughter did advise that she is eating better now and they will monitor this daily/and at medication administration in am as well, sent refill for fosamax and Glimipride 2mg  to pharmacy noted by pt daughter. MD Beverely Low made aware verbally

## 2011-07-24 NOTE — Telephone Encounter (Signed)
nateglinide 60mg  tablet still to expensive, per fax received from [facility], even with discount card medication is $83.00 and patient wants something cheaper, please review and advise [facility] pharmacy [phone]

## 2011-07-24 NOTE — Telephone Encounter (Signed)
These are the only 2 meds in this class.  She can apply for pt assistance or we can switch back to once daily med and monitor closely for low blood sugar

## 2011-07-24 NOTE — Addendum Note (Signed)
Addended by: Derry Lory A on: 07/24/2011 10:24 AM   Modules accepted: Orders

## 2011-07-25 ENCOUNTER — Telehealth: Payer: Self-pay | Admitting: *Deleted

## 2011-07-25 DIAGNOSIS — R1312 Dysphagia, oropharyngeal phase: Secondary | ICD-10-CM | POA: Diagnosis not present

## 2011-07-25 DIAGNOSIS — Z48812 Encounter for surgical aftercare following surgery on the circulatory system: Secondary | ICD-10-CM | POA: Diagnosis not present

## 2011-07-25 DIAGNOSIS — I70209 Unspecified atherosclerosis of native arteries of extremities, unspecified extremity: Secondary | ICD-10-CM | POA: Diagnosis not present

## 2011-07-25 DIAGNOSIS — I69991 Dysphagia following unspecified cerebrovascular disease: Secondary | ICD-10-CM | POA: Diagnosis not present

## 2011-07-25 DIAGNOSIS — E119 Type 2 diabetes mellitus without complications: Secondary | ICD-10-CM | POA: Diagnosis not present

## 2011-07-25 NOTE — Telephone Encounter (Signed)
Alda Ponder from [facility] called to advise pt has 7 blood blisters on her left leg that are 2 and a half CM in size, no fever noted, pt feels "ok" pulses are WNL, all vitals WNL, pt family notes pt not to see Vascular surg MD til July 9th with [place] apt as well,, set pt up for an apt tomorrow per MD Tabori instructions at 11:30am, advised if any changes occur or she worsens to please go to the ER, nurse noted and will notify family. Apt accepted and placed in chart

## 2011-07-26 ENCOUNTER — Ambulatory Visit (INDEPENDENT_AMBULATORY_CARE_PROVIDER_SITE_OTHER): Payer: Medicare Other | Admitting: Family Medicine

## 2011-07-26 ENCOUNTER — Encounter: Payer: Self-pay | Admitting: Family Medicine

## 2011-07-26 VITALS — BP 134/80 | HR 74 | Temp 98.1°F

## 2011-07-26 DIAGNOSIS — S81009A Unspecified open wound, unspecified knee, initial encounter: Secondary | ICD-10-CM

## 2011-07-26 DIAGNOSIS — S81802A Unspecified open wound, left lower leg, initial encounter: Secondary | ICD-10-CM

## 2011-07-26 DIAGNOSIS — S91009A Unspecified open wound, unspecified ankle, initial encounter: Secondary | ICD-10-CM | POA: Diagnosis not present

## 2011-07-26 NOTE — Progress Notes (Signed)
  Subjective:    Patient ID: Christina Pittman, female    DOB: 10/23/1919, 78 y.o.   MRN: [id number]  HPI L lower leg- [facility] nurse called yesterday indicating pt had multiple new blood blisters on remaining L leg.  Leg looks much better than last visit.  Much less swollen.  Scabbed areas are healing well.  + bruising.  No pain.  No drainage from the wounds w/ exception of scant bloody drainage w/ dressing change.  Has not been taking Lasix b/c swelling has been much improved.  Family feels areas have improved.  They are wrapping the leg daily as directed by vascular surgeon.  Pt reports feeling well- denies pain, fever, N/V.   Review of Systems For ROS see HPI     Objective:   Physical Exam  Vitals reviewed. Constitutional: She appears well-developed and well-nourished. No distress.       Appears much better today than previous  Skin: Skin is warm and dry. No erythema.       Some bruising of LLE below the knee No evidence of cellulitis Trace- +1 edema 3 well healing scabs on lower leg- 1 anteriorly, 1 medially, and 1 laterally No visible 'blood blisters' as reported           Assessment & Plan:

## 2011-07-26 NOTE — Telephone Encounter (Signed)
Called [facility] per pt seen in office today and noted by pt family that the [facility] nurse stated they could not dress the pt leg/s per they do not have an order to do so, called Joyce Gross with [facility] to advise pt can have a dressing wrap applied to her leg PRN, kay advised she will place order in pt chart. MD Beverely Low made aware verbally

## 2011-07-26 NOTE — Patient Instructions (Addendum)
We'll call Christina Pittman and ask what they want to do Consider applying Bacitracin to the scabs 2-3x/day Elevate your leg Take a Lasix today for the swelling Call with any questions or concerns Hang in there!

## 2011-07-26 NOTE — Assessment & Plan Note (Addendum)
Appear to be well healing.  Much improved from previous.  Discussed bacitracin.  Will get in touch w/ [facility] and determine what was their concern and what their plan was.  Will continue to follow.

## 2011-07-27 DIAGNOSIS — Z48812 Encounter for surgical aftercare following surgery on the circulatory system: Secondary | ICD-10-CM | POA: Diagnosis not present

## 2011-07-27 DIAGNOSIS — R1312 Dysphagia, oropharyngeal phase: Secondary | ICD-10-CM | POA: Diagnosis not present

## 2011-07-27 DIAGNOSIS — E119 Type 2 diabetes mellitus without complications: Secondary | ICD-10-CM | POA: Diagnosis not present

## 2011-07-27 DIAGNOSIS — I69991 Dysphagia following unspecified cerebrovascular disease: Secondary | ICD-10-CM | POA: Diagnosis not present

## 2011-07-27 DIAGNOSIS — I70209 Unspecified atherosclerosis of native arteries of extremities, unspecified extremity: Secondary | ICD-10-CM | POA: Diagnosis not present

## 2011-07-29 DIAGNOSIS — I69991 Dysphagia following unspecified cerebrovascular disease: Secondary | ICD-10-CM | POA: Diagnosis not present

## 2011-07-29 DIAGNOSIS — E119 Type 2 diabetes mellitus without complications: Secondary | ICD-10-CM | POA: Diagnosis not present

## 2011-07-29 DIAGNOSIS — I70209 Unspecified atherosclerosis of native arteries of extremities, unspecified extremity: Secondary | ICD-10-CM | POA: Diagnosis not present

## 2011-07-29 DIAGNOSIS — R1312 Dysphagia, oropharyngeal phase: Secondary | ICD-10-CM | POA: Diagnosis not present

## 2011-07-29 DIAGNOSIS — Z48812 Encounter for surgical aftercare following surgery on the circulatory system: Secondary | ICD-10-CM | POA: Diagnosis not present

## 2011-07-30 DIAGNOSIS — Z48812 Encounter for surgical aftercare following surgery on the circulatory system: Secondary | ICD-10-CM | POA: Diagnosis not present

## 2011-07-30 DIAGNOSIS — I69991 Dysphagia following unspecified cerebrovascular disease: Secondary | ICD-10-CM | POA: Diagnosis not present

## 2011-07-30 DIAGNOSIS — I70209 Unspecified atherosclerosis of native arteries of extremities, unspecified extremity: Secondary | ICD-10-CM | POA: Diagnosis not present

## 2011-07-30 DIAGNOSIS — E119 Type 2 diabetes mellitus without complications: Secondary | ICD-10-CM | POA: Diagnosis not present

## 2011-07-30 DIAGNOSIS — R1312 Dysphagia, oropharyngeal phase: Secondary | ICD-10-CM | POA: Diagnosis not present

## 2011-07-31 DIAGNOSIS — E119 Type 2 diabetes mellitus without complications: Secondary | ICD-10-CM | POA: Diagnosis not present

## 2011-07-31 DIAGNOSIS — I69991 Dysphagia following unspecified cerebrovascular disease: Secondary | ICD-10-CM | POA: Diagnosis not present

## 2011-07-31 DIAGNOSIS — Z48812 Encounter for surgical aftercare following surgery on the circulatory system: Secondary | ICD-10-CM | POA: Diagnosis not present

## 2011-07-31 DIAGNOSIS — I70209 Unspecified atherosclerosis of native arteries of extremities, unspecified extremity: Secondary | ICD-10-CM | POA: Diagnosis not present

## 2011-07-31 DIAGNOSIS — R1312 Dysphagia, oropharyngeal phase: Secondary | ICD-10-CM | POA: Diagnosis not present

## 2011-08-02 DIAGNOSIS — E119 Type 2 diabetes mellitus without complications: Secondary | ICD-10-CM | POA: Diagnosis not present

## 2011-08-02 DIAGNOSIS — R1312 Dysphagia, oropharyngeal phase: Secondary | ICD-10-CM | POA: Diagnosis not present

## 2011-08-02 DIAGNOSIS — Z48812 Encounter for surgical aftercare following surgery on the circulatory system: Secondary | ICD-10-CM | POA: Diagnosis not present

## 2011-08-02 DIAGNOSIS — I70209 Unspecified atherosclerosis of native arteries of extremities, unspecified extremity: Secondary | ICD-10-CM | POA: Diagnosis not present

## 2011-08-02 DIAGNOSIS — I69991 Dysphagia following unspecified cerebrovascular disease: Secondary | ICD-10-CM | POA: Diagnosis not present

## 2011-08-05 DIAGNOSIS — E119 Type 2 diabetes mellitus without complications: Secondary | ICD-10-CM | POA: Diagnosis not present

## 2011-08-05 DIAGNOSIS — I69991 Dysphagia following unspecified cerebrovascular disease: Secondary | ICD-10-CM | POA: Diagnosis not present

## 2011-08-05 DIAGNOSIS — I70209 Unspecified atherosclerosis of native arteries of extremities, unspecified extremity: Secondary | ICD-10-CM | POA: Diagnosis not present

## 2011-08-05 DIAGNOSIS — Z48812 Encounter for surgical aftercare following surgery on the circulatory system: Secondary | ICD-10-CM | POA: Diagnosis not present

## 2011-08-05 DIAGNOSIS — R1312 Dysphagia, oropharyngeal phase: Secondary | ICD-10-CM | POA: Diagnosis not present

## 2011-08-06 ENCOUNTER — Telehealth: Payer: Self-pay | Admitting: *Deleted

## 2011-08-06 DIAGNOSIS — I824Z9 Acute embolism and thrombosis of unspecified deep veins of unspecified distal lower extremity: Secondary | ICD-10-CM | POA: Diagnosis not present

## 2011-08-06 DIAGNOSIS — I82459 Acute embolism and thrombosis of unspecified peroneal vein: Secondary | ICD-10-CM | POA: Insufficient documentation

## 2011-08-06 DIAGNOSIS — E119 Type 2 diabetes mellitus without complications: Secondary | ICD-10-CM | POA: Diagnosis not present

## 2011-08-06 DIAGNOSIS — S78119A Complete traumatic amputation at level between unspecified hip and knee, initial encounter: Secondary | ICD-10-CM | POA: Diagnosis not present

## 2011-08-06 DIAGNOSIS — I70209 Unspecified atherosclerosis of native arteries of extremities, unspecified extremity: Secondary | ICD-10-CM | POA: Diagnosis not present

## 2011-08-06 DIAGNOSIS — M7989 Other specified soft tissue disorders: Secondary | ICD-10-CM | POA: Diagnosis not present

## 2011-08-06 DIAGNOSIS — R1312 Dysphagia, oropharyngeal phase: Secondary | ICD-10-CM | POA: Diagnosis not present

## 2011-08-06 DIAGNOSIS — Z48812 Encounter for surgical aftercare following surgery on the circulatory system: Secondary | ICD-10-CM | POA: Diagnosis not present

## 2011-08-06 DIAGNOSIS — I69991 Dysphagia following unspecified cerebrovascular disease: Secondary | ICD-10-CM | POA: Diagnosis not present

## 2011-08-06 NOTE — Assessment & Plan Note (Signed)
New.  Following w/ vascular in [place].  Not currently on anti-coag.  Agree w/ this decision based on pt's high fall risk.  Will follow along and assist as able.

## 2011-08-06 NOTE — Telephone Encounter (Signed)
FYI: Caller reports that patient has new skin tear received during OT while going to bathroom, hand got stuck between arm rest & door frame causing minor skin tear with large bruising [few inches] at distal aspect of radial wrist-Nurse from [place] followed up with patient injury 07.08.13/SLS

## 2011-08-06 NOTE — Assessment & Plan Note (Signed)
Clarified med list w/ family.  Pt has not had recent A1C in our system.  Will check labs and determine whether adjustments need to be made.  Pt had her diet controlled by facility while in rehab so I expect sugars to be good.

## 2011-08-06 NOTE — Assessment & Plan Note (Signed)
Much improved from last visit.  Following w/ vascular.  Decision made not to anticoagulate.  Discussed dependent edema and encouraged pt/family to take lasix prior to travel.  Labs in ER show normal K+ and kidney fxn so Lasix is ok.  Pt expressed understanding and is in agreement w/ plan.

## 2011-08-07 DIAGNOSIS — E119 Type 2 diabetes mellitus without complications: Secondary | ICD-10-CM | POA: Diagnosis not present

## 2011-08-07 DIAGNOSIS — I70209 Unspecified atherosclerosis of native arteries of extremities, unspecified extremity: Secondary | ICD-10-CM | POA: Diagnosis not present

## 2011-08-07 DIAGNOSIS — I69991 Dysphagia following unspecified cerebrovascular disease: Secondary | ICD-10-CM | POA: Diagnosis not present

## 2011-08-07 DIAGNOSIS — Z48812 Encounter for surgical aftercare following surgery on the circulatory system: Secondary | ICD-10-CM | POA: Diagnosis not present

## 2011-08-07 DIAGNOSIS — R1312 Dysphagia, oropharyngeal phase: Secondary | ICD-10-CM | POA: Diagnosis not present

## 2011-08-07 NOTE — Telephone Encounter (Signed)
.  left message to have patient return my call.  

## 2011-08-07 NOTE — Telephone Encounter (Signed)
Noted.  Pt needs to watch closely for signs of infection

## 2011-08-08 DIAGNOSIS — E119 Type 2 diabetes mellitus without complications: Secondary | ICD-10-CM | POA: Diagnosis not present

## 2011-08-08 DIAGNOSIS — I70209 Unspecified atherosclerosis of native arteries of extremities, unspecified extremity: Secondary | ICD-10-CM | POA: Diagnosis not present

## 2011-08-08 DIAGNOSIS — R1312 Dysphagia, oropharyngeal phase: Secondary | ICD-10-CM | POA: Diagnosis not present

## 2011-08-08 DIAGNOSIS — I69991 Dysphagia following unspecified cerebrovascular disease: Secondary | ICD-10-CM | POA: Diagnosis not present

## 2011-08-08 DIAGNOSIS — Z48812 Encounter for surgical aftercare following surgery on the circulatory system: Secondary | ICD-10-CM | POA: Diagnosis not present

## 2011-08-10 DIAGNOSIS — Z48812 Encounter for surgical aftercare following surgery on the circulatory system: Secondary | ICD-10-CM | POA: Diagnosis not present

## 2011-08-10 DIAGNOSIS — I69991 Dysphagia following unspecified cerebrovascular disease: Secondary | ICD-10-CM | POA: Diagnosis not present

## 2011-08-10 DIAGNOSIS — R1312 Dysphagia, oropharyngeal phase: Secondary | ICD-10-CM | POA: Diagnosis not present

## 2011-08-10 DIAGNOSIS — E119 Type 2 diabetes mellitus without complications: Secondary | ICD-10-CM | POA: Diagnosis not present

## 2011-08-10 DIAGNOSIS — I70209 Unspecified atherosclerosis of native arteries of extremities, unspecified extremity: Secondary | ICD-10-CM | POA: Diagnosis not present

## 2011-08-12 DIAGNOSIS — I69991 Dysphagia following unspecified cerebrovascular disease: Secondary | ICD-10-CM | POA: Diagnosis not present

## 2011-08-12 DIAGNOSIS — Z48812 Encounter for surgical aftercare following surgery on the circulatory system: Secondary | ICD-10-CM | POA: Diagnosis not present

## 2011-08-12 DIAGNOSIS — E119 Type 2 diabetes mellitus without complications: Secondary | ICD-10-CM | POA: Diagnosis not present

## 2011-08-12 DIAGNOSIS — I70209 Unspecified atherosclerosis of native arteries of extremities, unspecified extremity: Secondary | ICD-10-CM | POA: Diagnosis not present

## 2011-08-12 DIAGNOSIS — R1312 Dysphagia, oropharyngeal phase: Secondary | ICD-10-CM | POA: Diagnosis not present

## 2011-08-13 NOTE — Telephone Encounter (Signed)
Pt granddaughter noted no infection noted and that the family will continue to monitor, also wanted to advise that for the past week on and off the pt is noted as eating regularly however in the am her CBG are low, one am was 73 and today noted at 81, pt has no other sxs that are of concern, per notes after dinner her CBG register 163, family did advise they are holding the am medication if the CBG is below 90 as instructed just concerned with recent low readings, also wanted to note pt is complaining about arm numbness in the morning, however pt has been advised repeatedly that all during the night she is hanging her arm off the side of the bed, no matter how many times the pt is moved or hand is replaced back onto the bed, pt still places arm off to the side, pt family denies any other sxs to accompany numbness and notes numbness goes away after she is awake for a "while", please note and advise

## 2011-08-13 NOTE — Telephone Encounter (Signed)
Pts daughter returned your call. Call back # [phone]

## 2011-08-14 NOTE — Telephone Encounter (Signed)
Called and spoke to Nia, per pt answered phone and gave verbal to give Nia information per noted tired at this time, gave all instructions, Nia understood and will pass information to the remaining family

## 2011-08-14 NOTE — Telephone Encounter (Signed)
Arm numbness is positional.  CBGs are good.  Family is doing the right thing by holding AM meds.  Glad she is eating again.

## 2011-08-15 DIAGNOSIS — R1312 Dysphagia, oropharyngeal phase: Secondary | ICD-10-CM | POA: Diagnosis not present

## 2011-08-15 DIAGNOSIS — Z48812 Encounter for surgical aftercare following surgery on the circulatory system: Secondary | ICD-10-CM | POA: Diagnosis not present

## 2011-08-15 DIAGNOSIS — I70209 Unspecified atherosclerosis of native arteries of extremities, unspecified extremity: Secondary | ICD-10-CM | POA: Diagnosis not present

## 2011-08-15 DIAGNOSIS — E119 Type 2 diabetes mellitus without complications: Secondary | ICD-10-CM | POA: Diagnosis not present

## 2011-08-15 DIAGNOSIS — I69991 Dysphagia following unspecified cerebrovascular disease: Secondary | ICD-10-CM | POA: Diagnosis not present

## 2011-08-19 DIAGNOSIS — I70209 Unspecified atherosclerosis of native arteries of extremities, unspecified extremity: Secondary | ICD-10-CM | POA: Diagnosis not present

## 2011-08-19 DIAGNOSIS — R1312 Dysphagia, oropharyngeal phase: Secondary | ICD-10-CM | POA: Diagnosis not present

## 2011-08-19 DIAGNOSIS — Z48812 Encounter for surgical aftercare following surgery on the circulatory system: Secondary | ICD-10-CM | POA: Diagnosis not present

## 2011-08-19 DIAGNOSIS — I69991 Dysphagia following unspecified cerebrovascular disease: Secondary | ICD-10-CM | POA: Diagnosis not present

## 2011-08-19 DIAGNOSIS — E119 Type 2 diabetes mellitus without complications: Secondary | ICD-10-CM | POA: Diagnosis not present

## 2011-08-21 DIAGNOSIS — E119 Type 2 diabetes mellitus without complications: Secondary | ICD-10-CM | POA: Diagnosis not present

## 2011-08-21 DIAGNOSIS — I70209 Unspecified atherosclerosis of native arteries of extremities, unspecified extremity: Secondary | ICD-10-CM | POA: Diagnosis not present

## 2011-08-21 DIAGNOSIS — I69991 Dysphagia following unspecified cerebrovascular disease: Secondary | ICD-10-CM | POA: Diagnosis not present

## 2011-08-21 DIAGNOSIS — R1312 Dysphagia, oropharyngeal phase: Secondary | ICD-10-CM | POA: Diagnosis not present

## 2011-08-21 DIAGNOSIS — Z48812 Encounter for surgical aftercare following surgery on the circulatory system: Secondary | ICD-10-CM | POA: Diagnosis not present

## 2011-08-26 DIAGNOSIS — R1312 Dysphagia, oropharyngeal phase: Secondary | ICD-10-CM | POA: Diagnosis not present

## 2011-08-26 DIAGNOSIS — Z48812 Encounter for surgical aftercare following surgery on the circulatory system: Secondary | ICD-10-CM | POA: Diagnosis not present

## 2011-08-26 DIAGNOSIS — E119 Type 2 diabetes mellitus without complications: Secondary | ICD-10-CM | POA: Diagnosis not present

## 2011-08-26 DIAGNOSIS — I70209 Unspecified atherosclerosis of native arteries of extremities, unspecified extremity: Secondary | ICD-10-CM | POA: Diagnosis not present

## 2011-08-26 DIAGNOSIS — I69991 Dysphagia following unspecified cerebrovascular disease: Secondary | ICD-10-CM | POA: Diagnosis not present

## 2011-08-28 DIAGNOSIS — I70209 Unspecified atherosclerosis of native arteries of extremities, unspecified extremity: Secondary | ICD-10-CM | POA: Diagnosis not present

## 2011-08-28 DIAGNOSIS — E119 Type 2 diabetes mellitus without complications: Secondary | ICD-10-CM | POA: Diagnosis not present

## 2011-08-28 DIAGNOSIS — Z48812 Encounter for surgical aftercare following surgery on the circulatory system: Secondary | ICD-10-CM | POA: Diagnosis not present

## 2011-08-28 DIAGNOSIS — I69991 Dysphagia following unspecified cerebrovascular disease: Secondary | ICD-10-CM | POA: Diagnosis not present

## 2011-08-28 DIAGNOSIS — R1312 Dysphagia, oropharyngeal phase: Secondary | ICD-10-CM | POA: Diagnosis not present

## 2011-08-29 ENCOUNTER — Telehealth: Payer: Self-pay | Admitting: *Deleted

## 2011-08-29 DIAGNOSIS — I69991 Dysphagia following unspecified cerebrovascular disease: Secondary | ICD-10-CM | POA: Diagnosis not present

## 2011-08-29 DIAGNOSIS — E119 Type 2 diabetes mellitus without complications: Secondary | ICD-10-CM | POA: Diagnosis not present

## 2011-08-29 DIAGNOSIS — R1312 Dysphagia, oropharyngeal phase: Secondary | ICD-10-CM | POA: Diagnosis not present

## 2011-08-29 DIAGNOSIS — Z48812 Encounter for surgical aftercare following surgery on the circulatory system: Secondary | ICD-10-CM | POA: Diagnosis not present

## 2011-08-29 DIAGNOSIS — I70209 Unspecified atherosclerosis of native arteries of extremities, unspecified extremity: Secondary | ICD-10-CM | POA: Diagnosis not present

## 2011-08-29 NOTE — Telephone Encounter (Signed)
Returned PT rep Danielle call from [place] to advise verbal order to continue pt PT BID for 2 weeks. Danielle accepted verbal order and will implement PT for the pt

## 2011-09-02 ENCOUNTER — Telehealth: Payer: Self-pay | Admitting: *Deleted

## 2011-09-02 DIAGNOSIS — I69991 Dysphagia following unspecified cerebrovascular disease: Secondary | ICD-10-CM | POA: Diagnosis not present

## 2011-09-02 DIAGNOSIS — I70209 Unspecified atherosclerosis of native arteries of extremities, unspecified extremity: Secondary | ICD-10-CM | POA: Diagnosis not present

## 2011-09-02 DIAGNOSIS — E119 Type 2 diabetes mellitus without complications: Secondary | ICD-10-CM | POA: Diagnosis not present

## 2011-09-02 DIAGNOSIS — Z48812 Encounter for surgical aftercare following surgery on the circulatory system: Secondary | ICD-10-CM | POA: Diagnosis not present

## 2011-09-02 DIAGNOSIS — R1312 Dysphagia, oropharyngeal phase: Secondary | ICD-10-CM | POA: Diagnosis not present

## 2011-09-02 NOTE — Telephone Encounter (Signed)
Received vm on triage line to request verbal order for pt to have an in home evaluation for continuance of home Health OT today, left vm for Silas [place]. OT rep with [facility] to give verbal order for pt OT, gave office number and advised if any further assistance needed, please call office.

## 2011-09-03 DIAGNOSIS — I739 Peripheral vascular disease, unspecified: Secondary | ICD-10-CM | POA: Diagnosis not present

## 2011-09-03 DIAGNOSIS — I10 Essential (primary) hypertension: Secondary | ICD-10-CM | POA: Diagnosis not present

## 2011-09-03 DIAGNOSIS — S78119A Complete traumatic amputation at level between unspecified hip and knee, initial encounter: Secondary | ICD-10-CM | POA: Diagnosis not present

## 2011-09-03 DIAGNOSIS — I69959 Hemiplegia and hemiparesis following unspecified cerebrovascular disease affecting unspecified side: Secondary | ICD-10-CM | POA: Diagnosis not present

## 2011-09-03 DIAGNOSIS — E119 Type 2 diabetes mellitus without complications: Secondary | ICD-10-CM | POA: Diagnosis not present

## 2011-09-04 ENCOUNTER — Other Ambulatory Visit (INDEPENDENT_AMBULATORY_CARE_PROVIDER_SITE_OTHER): Payer: Medicare Other

## 2011-09-04 DIAGNOSIS — S78119A Complete traumatic amputation at level between unspecified hip and knee, initial encounter: Secondary | ICD-10-CM | POA: Diagnosis not present

## 2011-09-04 DIAGNOSIS — I10 Essential (primary) hypertension: Secondary | ICD-10-CM | POA: Diagnosis not present

## 2011-09-04 DIAGNOSIS — N390 Urinary tract infection, site not specified: Secondary | ICD-10-CM

## 2011-09-04 DIAGNOSIS — I69959 Hemiplegia and hemiparesis following unspecified cerebrovascular disease affecting unspecified side: Secondary | ICD-10-CM | POA: Diagnosis not present

## 2011-09-04 DIAGNOSIS — E119 Type 2 diabetes mellitus without complications: Secondary | ICD-10-CM | POA: Diagnosis not present

## 2011-09-04 DIAGNOSIS — I739 Peripheral vascular disease, unspecified: Secondary | ICD-10-CM | POA: Diagnosis not present

## 2011-09-04 LAB — POCT URINALYSIS DIPSTICK
Glucose, UA: NEGATIVE
Nitrite, UA: POSITIVE
Urobilinogen, UA: 0.2

## 2011-09-04 MED ORDER — CEPHALEXIN 500 MG PO CAPS: 500.0000 mg | ORAL_CAPSULE | Freq: Two times a day (BID) | ORAL | Status: AC

## 2011-09-05 DIAGNOSIS — I739 Peripheral vascular disease, unspecified: Secondary | ICD-10-CM | POA: Diagnosis not present

## 2011-09-05 DIAGNOSIS — E119 Type 2 diabetes mellitus without complications: Secondary | ICD-10-CM | POA: Diagnosis not present

## 2011-09-05 DIAGNOSIS — S78119A Complete traumatic amputation at level between unspecified hip and knee, initial encounter: Secondary | ICD-10-CM | POA: Diagnosis not present

## 2011-09-05 DIAGNOSIS — I69959 Hemiplegia and hemiparesis following unspecified cerebrovascular disease affecting unspecified side: Secondary | ICD-10-CM | POA: Diagnosis not present

## 2011-09-05 DIAGNOSIS — I10 Essential (primary) hypertension: Secondary | ICD-10-CM | POA: Diagnosis not present

## 2011-09-07 LAB — URINE CULTURE: Colony Count: >=100000

## 2011-09-09 ENCOUNTER — Telehealth: Payer: Self-pay | Admitting: *Deleted

## 2011-09-09 DIAGNOSIS — E119 Type 2 diabetes mellitus without complications: Secondary | ICD-10-CM | POA: Diagnosis not present

## 2011-09-09 DIAGNOSIS — S78119A Complete traumatic amputation at level between unspecified hip and knee, initial encounter: Secondary | ICD-10-CM | POA: Diagnosis not present

## 2011-09-09 DIAGNOSIS — I10 Essential (primary) hypertension: Secondary | ICD-10-CM | POA: Diagnosis not present

## 2011-09-09 DIAGNOSIS — I739 Peripheral vascular disease, unspecified: Secondary | ICD-10-CM | POA: Diagnosis not present

## 2011-09-09 DIAGNOSIS — I69959 Hemiplegia and hemiparesis following unspecified cerebrovascular disease affecting unspecified side: Secondary | ICD-10-CM | POA: Diagnosis not present

## 2011-09-09 MED ORDER — CEFTRIAXONE SODIUM 1 G IJ SOLR: 1.0000 g | Freq: Every day | INTRAMUSCULAR | Status: DC

## 2011-09-09 NOTE — Telephone Encounter (Addendum)
Called pt daughter per noted incoming staff message noted that nurse Selena Batten from [place] called to advise that the pt is refusing to IV medication, advised daughter that the only place to get the Rocephin IM medication is [facility] pharmacy and this will have to be picked up by the family, daughter noted that she has cataract surgery and can not drive for two days, she will speak to pt about reconsidering the IV medication instead per circumstances, MD Beverely Low made aware verbally, will await pt daughter to call back to advise  Notation continue received incoming call from patty and advised that the pt does not want the IV medication and DOES want the IM injection instead, advised that we will send the medication to [facility], sent via escribe Rocephin 1gm Qday for 5 days, advised the home health nurse would be coming out to the home to administer, pt grandaughter patty understood and explained to pt, pt understood.  Called nurse Selena Batten with [facility] back to advise that the pt does want the IM injection medication, advised per MD Beverely Low, that the anaphylaxis kit needs to be provided by home health per their protocol and this office gives out ABT with no kits often, [facility] nurse advised that she will implement the plan to get the medication administered, also noted the pt family will pick up medication from the pharmacy today and was noted the pharmacy location and phone number if any further questions, rep had spoken with pharmacy tech Irving Burton today, MD Tabori aware verbally of all conversations and implementation to take place for pt UTI

## 2011-09-10 DIAGNOSIS — I69959 Hemiplegia and hemiparesis following unspecified cerebrovascular disease affecting unspecified side: Secondary | ICD-10-CM | POA: Diagnosis not present

## 2011-09-10 DIAGNOSIS — I739 Peripheral vascular disease, unspecified: Secondary | ICD-10-CM | POA: Diagnosis not present

## 2011-09-10 DIAGNOSIS — S78119A Complete traumatic amputation at level between unspecified hip and knee, initial encounter: Secondary | ICD-10-CM | POA: Diagnosis not present

## 2011-09-10 DIAGNOSIS — I10 Essential (primary) hypertension: Secondary | ICD-10-CM | POA: Diagnosis not present

## 2011-09-10 DIAGNOSIS — E119 Type 2 diabetes mellitus without complications: Secondary | ICD-10-CM | POA: Diagnosis not present

## 2011-09-11 DIAGNOSIS — S78119A Complete traumatic amputation at level between unspecified hip and knee, initial encounter: Secondary | ICD-10-CM | POA: Diagnosis not present

## 2011-09-11 DIAGNOSIS — I10 Essential (primary) hypertension: Secondary | ICD-10-CM | POA: Diagnosis not present

## 2011-09-11 DIAGNOSIS — E119 Type 2 diabetes mellitus without complications: Secondary | ICD-10-CM | POA: Diagnosis not present

## 2011-09-11 DIAGNOSIS — I739 Peripheral vascular disease, unspecified: Secondary | ICD-10-CM | POA: Diagnosis not present

## 2011-09-11 DIAGNOSIS — I69959 Hemiplegia and hemiparesis following unspecified cerebrovascular disease affecting unspecified side: Secondary | ICD-10-CM | POA: Diagnosis not present

## 2011-09-12 DIAGNOSIS — I10 Essential (primary) hypertension: Secondary | ICD-10-CM | POA: Diagnosis not present

## 2011-09-12 DIAGNOSIS — I739 Peripheral vascular disease, unspecified: Secondary | ICD-10-CM | POA: Diagnosis not present

## 2011-09-12 DIAGNOSIS — E119 Type 2 diabetes mellitus without complications: Secondary | ICD-10-CM | POA: Diagnosis not present

## 2011-09-12 DIAGNOSIS — S78119A Complete traumatic amputation at level between unspecified hip and knee, initial encounter: Secondary | ICD-10-CM | POA: Diagnosis not present

## 2011-09-12 DIAGNOSIS — I69959 Hemiplegia and hemiparesis following unspecified cerebrovascular disease affecting unspecified side: Secondary | ICD-10-CM | POA: Diagnosis not present

## 2011-09-13 DIAGNOSIS — E119 Type 2 diabetes mellitus without complications: Secondary | ICD-10-CM | POA: Diagnosis not present

## 2011-09-13 DIAGNOSIS — I739 Peripheral vascular disease, unspecified: Secondary | ICD-10-CM | POA: Diagnosis not present

## 2011-09-13 DIAGNOSIS — I69959 Hemiplegia and hemiparesis following unspecified cerebrovascular disease affecting unspecified side: Secondary | ICD-10-CM | POA: Diagnosis not present

## 2011-09-13 DIAGNOSIS — S78119A Complete traumatic amputation at level between unspecified hip and knee, initial encounter: Secondary | ICD-10-CM | POA: Diagnosis not present

## 2011-09-13 DIAGNOSIS — I10 Essential (primary) hypertension: Secondary | ICD-10-CM | POA: Diagnosis not present

## 2011-09-14 DIAGNOSIS — I10 Essential (primary) hypertension: Secondary | ICD-10-CM | POA: Diagnosis not present

## 2011-09-14 DIAGNOSIS — I69959 Hemiplegia and hemiparesis following unspecified cerebrovascular disease affecting unspecified side: Secondary | ICD-10-CM | POA: Diagnosis not present

## 2011-09-14 DIAGNOSIS — S78119A Complete traumatic amputation at level between unspecified hip and knee, initial encounter: Secondary | ICD-10-CM | POA: Diagnosis not present

## 2011-09-14 DIAGNOSIS — E119 Type 2 diabetes mellitus without complications: Secondary | ICD-10-CM | POA: Diagnosis not present

## 2011-09-14 DIAGNOSIS — I739 Peripheral vascular disease, unspecified: Secondary | ICD-10-CM | POA: Diagnosis not present

## 2011-09-16 ENCOUNTER — Ambulatory Visit: Payer: Medicare Other | Attending: Surgery | Admitting: Physical Therapy

## 2011-09-16 DIAGNOSIS — IMO0001 Reserved for inherently not codable concepts without codable children: Secondary | ICD-10-CM | POA: Insufficient documentation

## 2011-09-16 DIAGNOSIS — S78119A Complete traumatic amputation at level between unspecified hip and knee, initial encounter: Secondary | ICD-10-CM | POA: Insufficient documentation

## 2011-09-16 DIAGNOSIS — M6281 Muscle weakness (generalized): Secondary | ICD-10-CM | POA: Diagnosis not present

## 2011-09-16 DIAGNOSIS — R5381 Other malaise: Secondary | ICD-10-CM | POA: Diagnosis not present

## 2011-09-16 DIAGNOSIS — R269 Unspecified abnormalities of gait and mobility: Secondary | ICD-10-CM | POA: Insufficient documentation

## 2011-09-24 ENCOUNTER — Ambulatory Visit: Payer: Medicare Other | Admitting: Physical Therapy

## 2011-09-24 DIAGNOSIS — IMO0001 Reserved for inherently not codable concepts without codable children: Secondary | ICD-10-CM | POA: Diagnosis not present

## 2011-09-24 DIAGNOSIS — M6281 Muscle weakness (generalized): Secondary | ICD-10-CM | POA: Diagnosis not present

## 2011-09-24 DIAGNOSIS — S78119A Complete traumatic amputation at level between unspecified hip and knee, initial encounter: Secondary | ICD-10-CM | POA: Diagnosis not present

## 2011-09-24 DIAGNOSIS — R5381 Other malaise: Secondary | ICD-10-CM | POA: Diagnosis not present

## 2011-09-24 DIAGNOSIS — R269 Unspecified abnormalities of gait and mobility: Secondary | ICD-10-CM | POA: Diagnosis not present

## 2011-09-26 ENCOUNTER — Ambulatory Visit: Payer: Medicare Other | Admitting: Physical Therapy

## 2011-09-26 DIAGNOSIS — IMO0001 Reserved for inherently not codable concepts without codable children: Secondary | ICD-10-CM | POA: Diagnosis not present

## 2011-09-26 DIAGNOSIS — R269 Unspecified abnormalities of gait and mobility: Secondary | ICD-10-CM | POA: Diagnosis not present

## 2011-09-26 DIAGNOSIS — S78119A Complete traumatic amputation at level between unspecified hip and knee, initial encounter: Secondary | ICD-10-CM | POA: Diagnosis not present

## 2011-09-26 DIAGNOSIS — M6281 Muscle weakness (generalized): Secondary | ICD-10-CM | POA: Diagnosis not present

## 2011-09-26 DIAGNOSIS — R5381 Other malaise: Secondary | ICD-10-CM | POA: Diagnosis not present

## 2011-09-27 ENCOUNTER — Ambulatory Visit: Payer: Medicare Other | Admitting: Physical Therapy

## 2011-10-01 ENCOUNTER — Ambulatory Visit: Payer: Medicare Other | Admitting: Physical Therapy

## 2011-10-01 ENCOUNTER — Ambulatory Visit: Payer: Medicare Other | Attending: Surgery | Admitting: Physical Therapy

## 2011-10-01 DIAGNOSIS — M6281 Muscle weakness (generalized): Secondary | ICD-10-CM | POA: Insufficient documentation

## 2011-10-01 DIAGNOSIS — R5381 Other malaise: Secondary | ICD-10-CM | POA: Diagnosis not present

## 2011-10-01 DIAGNOSIS — IMO0001 Reserved for inherently not codable concepts without codable children: Secondary | ICD-10-CM | POA: Diagnosis not present

## 2011-10-01 DIAGNOSIS — R269 Unspecified abnormalities of gait and mobility: Secondary | ICD-10-CM | POA: Insufficient documentation

## 2011-10-01 DIAGNOSIS — S78119A Complete traumatic amputation at level between unspecified hip and knee, initial encounter: Secondary | ICD-10-CM | POA: Insufficient documentation

## 2011-10-03 ENCOUNTER — Ambulatory Visit: Payer: Medicare Other | Admitting: Physical Therapy

## 2011-10-04 DIAGNOSIS — I10 Essential (primary) hypertension: Secondary | ICD-10-CM | POA: Diagnosis not present

## 2011-10-04 DIAGNOSIS — E119 Type 2 diabetes mellitus without complications: Secondary | ICD-10-CM

## 2011-10-04 DIAGNOSIS — I739 Peripheral vascular disease, unspecified: Secondary | ICD-10-CM | POA: Diagnosis not present

## 2011-10-04 DIAGNOSIS — S78119A Complete traumatic amputation at level between unspecified hip and knee, initial encounter: Secondary | ICD-10-CM

## 2011-10-04 DIAGNOSIS — I69959 Hemiplegia and hemiparesis following unspecified cerebrovascular disease affecting unspecified side: Secondary | ICD-10-CM | POA: Diagnosis not present

## 2011-10-08 ENCOUNTER — Ambulatory Visit: Payer: Medicare Other | Admitting: Physical Therapy

## 2011-10-10 ENCOUNTER — Encounter: Payer: Self-pay | Admitting: Family Medicine

## 2011-10-10 ENCOUNTER — Ambulatory Visit: Payer: Medicare Other | Admitting: Physical Therapy

## 2011-10-10 ENCOUNTER — Ambulatory Visit (INDEPENDENT_AMBULATORY_CARE_PROVIDER_SITE_OTHER): Payer: Medicare Other | Admitting: Family Medicine

## 2011-10-10 VITALS — BP 123/78 | HR 66 | Temp 98.1°F | Ht 63.25 in

## 2011-10-10 DIAGNOSIS — R300 Dysuria: Secondary | ICD-10-CM

## 2011-10-10 DIAGNOSIS — R209 Unspecified disturbances of skin sensation: Secondary | ICD-10-CM | POA: Diagnosis not present

## 2011-10-10 DIAGNOSIS — R208 Other disturbances of skin sensation: Secondary | ICD-10-CM

## 2011-10-10 DIAGNOSIS — E119 Type 2 diabetes mellitus without complications: Secondary | ICD-10-CM

## 2011-10-10 DIAGNOSIS — I10 Essential (primary) hypertension: Secondary | ICD-10-CM | POA: Diagnosis not present

## 2011-10-10 DIAGNOSIS — R609 Edema, unspecified: Secondary | ICD-10-CM

## 2011-10-10 LAB — BASIC METABOLIC PANEL
CO2: 24 mEq/L (ref 19–32)
Calcium: 8.8 mg/dL (ref 8.4–10.5)
Chloride: 110 mEq/L (ref 96–112)
Sodium: 142 mEq/L (ref 135–145)

## 2011-10-10 LAB — POCT URINALYSIS DIPSTICK
Blood, UA: NEGATIVE
Ketones, UA: NEGATIVE
Spec Grav, UA: 1.015
pH, UA: 7

## 2011-10-10 LAB — HEMOGLOBIN A1C: Hgb A1c MFr Bld: 6.2 % (ref 4.6–6.5)

## 2011-10-10 NOTE — Assessment & Plan Note (Signed)
Chronic problem.  Well controlled.  Currently asymptomatic except for edema.  No changes at this time.  Will follow closely.

## 2011-10-10 NOTE — Assessment & Plan Note (Signed)
New.  Pt reports sxs have improved.  UA w/ trace leukocytes but will hold off on abx until culture results available.  Will follow.

## 2011-10-10 NOTE — Assessment & Plan Note (Signed)
Chronic problem, worsens when pt is up and about.  Today is 2+ pitting to level of mid-shin.  Encouraged her to take lasix today when she returns home.  Will check electrolytes.

## 2011-10-10 NOTE — Assessment & Plan Note (Signed)
Chronic problem, due for 3 month A1C and due to difficulty w/ mobility/transportation will draw today.  Forms completed for diabetic shoes.  Currently on Amaryl.  Will adjust meds prn based on lab results.  Pt expressed understanding and is in agreement w/ plan.

## 2011-10-10 NOTE — Progress Notes (Signed)
  Subjective:    Patient ID: Christina Pittman, female    DOB: 1919-11-09, 78 y.o.   MRN: [id number]  HPI ? UTI- pt reports dysuria starting last week.  sxs improved yesterday.  Denies frequency.  Only urinating ~2x/day.  No incontinence.  No fevers.  No nausea or vomiting.  + low back pain earlier this week but this has improved.  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  Pt requesting diabetic shoe for L foot.  No numbness or tingling of L foot.  + edema.  Pt's last A1C was 6/20.  Has not been checking CBGs at home b/c she ran out of strips and can't afford them.  Denies symptomatic lows.  On Amaryl 2 mg daily.  No CP, SOB, HAs, visual changes, + edema.    HTN- chronic problem, good control on coreg and lasix.  Asymptomatic w/ exception of edema.   Review of Systems For ROS see HPI     Objective:   Physical Exam  Vitals reviewed. Constitutional: She is oriented to person, place, and time. She appears well-developed and well-nourished. No distress.  HENT:  Head: Normocephalic and atraumatic.  Eyes: Conjunctivae normal and EOM are normal. Pupils are equal, round, and reactive to light.  Neck: Normal range of motion. Neck supple. No thyromegaly present.  Cardiovascular: Normal rate, regular rhythm and intact distal pulses.   Murmur (II-III/VI SEM) heard. Pulmonary/Chest: Effort normal and breath sounds normal. No respiratory distress.  Abdominal: Soft. She exhibits no distension. There is no tenderness.  Musculoskeletal: She exhibits edema (2+ pitting edema of L LE).  Lymphadenopathy:    She has no cervical adenopathy.  Neurological: She is alert and oriented to person, place, and time.  Skin: Skin is warm and dry.  Psychiatric: She has a normal mood and affect. Her behavior is normal.          Assessment & Plan:

## 2011-10-10 NOTE — Patient Instructions (Addendum)
Follow up in 3-4 months to recheck diabetes We'll notify you of your lab results and make any changes if needed Take a Lasix when you get home and elevate your leg We'll call you with your urine culture results and start antibiotics if needed Call with any questions or concerns You look SOOO much better!!!  Hang in there!

## 2011-10-11 ENCOUNTER — Encounter: Payer: Self-pay | Admitting: *Deleted

## 2011-10-15 ENCOUNTER — Other Ambulatory Visit: Payer: Self-pay | Admitting: Family Medicine

## 2011-10-15 ENCOUNTER — Ambulatory Visit: Payer: Medicare Other | Admitting: Physical Therapy

## 2011-10-15 MED ORDER — CIPROFLOXACIN HCL 500 MG PO TABS: 500.0000 mg | ORAL_TABLET | Freq: Two times a day (BID) | ORAL | Status: DC

## 2011-10-17 ENCOUNTER — Ambulatory Visit: Payer: Medicare Other | Admitting: Physical Therapy

## 2011-10-21 ENCOUNTER — Ambulatory Visit: Payer: Medicare Other | Admitting: Physical Therapy

## 2011-10-22 ENCOUNTER — Telehealth: Payer: Self-pay | Admitting: Family Medicine

## 2011-10-22 MED ORDER — CARVEDILOL 25 MG PO TABS: 25.0000 mg | ORAL_TABLET | Freq: Two times a day (BID) | ORAL | Status: DC

## 2011-10-22 NOTE — Telephone Encounter (Signed)
Pts daughter states she is out of refills and needs Carvedilol called into [facility] on [address].

## 2011-10-22 NOTE — Telephone Encounter (Signed)
rx sent to pharmacy by e-script  

## 2011-10-24 ENCOUNTER — Ambulatory Visit: Payer: Medicare Other | Admitting: Physical Therapy

## 2011-10-25 ENCOUNTER — Ambulatory Visit (INDEPENDENT_AMBULATORY_CARE_PROVIDER_SITE_OTHER): Payer: Medicare Other | Admitting: Family Medicine

## 2011-10-25 ENCOUNTER — Encounter: Payer: Self-pay | Admitting: Family Medicine

## 2011-10-25 VITALS — BP 124/84 | HR 60 | Temp 98.2°F | Ht 64.0 in

## 2011-10-25 DIAGNOSIS — T148XXA Other injury of unspecified body region, initial encounter: Secondary | ICD-10-CM | POA: Insufficient documentation

## 2011-10-25 DIAGNOSIS — L988 Other specified disorders of the skin and subcutaneous tissue: Secondary | ICD-10-CM

## 2011-10-25 MED ORDER — CEPHALEXIN 500 MG PO CAPS: 500.0000 mg | ORAL_CAPSULE | Freq: Two times a day (BID) | ORAL | Status: AC

## 2011-10-25 NOTE — Patient Instructions (Addendum)
This looks like a blood blister and doesn't look infected but we'll be overly cautious and start keflex twice daily If you have worsening redness, the area darkens or other concerns- please call! Hang in there!!

## 2011-10-25 NOTE — Progress Notes (Signed)
  Subjective:    Patient ID: Christina Pittman, female    DOB: 1919/11/25, 78 y.o.   MRN: [id number]  HPI 'blood blister' on L 2nd toe.  Area was initially blue, now red.  1st noticed 2-3 days ago.  'sore' but not painful.  Doesn't recall bumping into anything but may have banged it on the car.  No drainage, no surrounding redness.  Terrified of needing another amputation.   Review of Systems For ROS see HPI     Objective:   Physical Exam  Vitals reviewed. Constitutional: No distress.  Musculoskeletal: She exhibits edema (1+ edema of LLE to mid shin). She exhibits no tenderness.  Skin: Skin is warm and dry.       Small blood blister noted on L 2nd toe along medial edge of nail.  No TTP, no surrounding erythema or induration, no evidence of infection.          Assessment & Plan:

## 2011-10-26 NOTE — Assessment & Plan Note (Signed)
New.  No evidence of infxn but due to hx of vascular compromise and recent gangrene requiring amputation of R leg, will be overly cautious and start abx.  Reviewed supportive care and red flags that should prompt return.  Pt expressed understanding and is in agreement w/ plan.

## 2011-10-28 ENCOUNTER — Ambulatory Visit: Payer: Medicare Other | Admitting: Physical Therapy

## 2011-10-28 DIAGNOSIS — S78119A Complete traumatic amputation at level between unspecified hip and knee, initial encounter: Secondary | ICD-10-CM | POA: Diagnosis not present

## 2011-10-28 DIAGNOSIS — IMO0001 Reserved for inherently not codable concepts without codable children: Secondary | ICD-10-CM | POA: Diagnosis not present

## 2011-10-28 DIAGNOSIS — R5381 Other malaise: Secondary | ICD-10-CM | POA: Diagnosis not present

## 2011-10-28 DIAGNOSIS — M6281 Muscle weakness (generalized): Secondary | ICD-10-CM | POA: Diagnosis not present

## 2011-10-28 DIAGNOSIS — R269 Unspecified abnormalities of gait and mobility: Secondary | ICD-10-CM | POA: Diagnosis not present

## 2011-10-29 ENCOUNTER — Telehealth: Payer: Self-pay | Admitting: Family Medicine

## 2011-10-29 ENCOUNTER — Telehealth: Payer: Self-pay | Admitting: *Deleted

## 2011-10-29 NOTE — Telephone Encounter (Signed)
Placed form up front for pt pick up today

## 2011-10-29 NOTE — Telephone Encounter (Signed)
Pt daughter walked in to request a handicap sticker be filled out for assistance with transporting pt, requested that this be filled out if possible today, advised MD Tabori half day. Pt daughter understood, placed form on MD [employer],

## 2011-10-29 NOTE — Telephone Encounter (Signed)
Noted  

## 2011-10-29 NOTE — Telephone Encounter (Signed)
Form complete

## 2011-10-29 NOTE — Telephone Encounter (Signed)
Pt daughter states they did not need the note.

## 2011-10-31 ENCOUNTER — Ambulatory Visit: Payer: Medicare Other | Attending: Surgery | Admitting: Physical Therapy

## 2011-10-31 DIAGNOSIS — M6281 Muscle weakness (generalized): Secondary | ICD-10-CM | POA: Insufficient documentation

## 2011-10-31 DIAGNOSIS — S78119A Complete traumatic amputation at level between unspecified hip and knee, initial encounter: Secondary | ICD-10-CM | POA: Diagnosis not present

## 2011-10-31 DIAGNOSIS — IMO0001 Reserved for inherently not codable concepts without codable children: Secondary | ICD-10-CM | POA: Diagnosis not present

## 2011-10-31 DIAGNOSIS — R5381 Other malaise: Secondary | ICD-10-CM | POA: Insufficient documentation

## 2011-10-31 DIAGNOSIS — R269 Unspecified abnormalities of gait and mobility: Secondary | ICD-10-CM | POA: Insufficient documentation

## 2011-11-05 ENCOUNTER — Other Ambulatory Visit: Payer: Self-pay | Admitting: Family Medicine

## 2011-11-05 ENCOUNTER — Ambulatory Visit: Payer: Medicare Other | Admitting: Physical Therapy

## 2011-11-05 NOTE — Telephone Encounter (Signed)
Refills x 2 last ov 9.27.13 acute  1-PLAVIX 75 MG Take 1 tablet (75 mg total) by mouth daily with breakfast. #30 last fill 9.12.13  2-ZOCOR 20 MG Take 1 tablet (20 mg total) by mouth daily at 6 PM #30 last fill 9.12.13

## 2011-11-06 MED ORDER — SIMVASTATIN 20 MG PO TABS: 20.0000 mg | ORAL_TABLET | Freq: Every day | ORAL | Status: DC

## 2011-11-06 MED ORDER — CLOPIDOGREL BISULFATE 75 MG PO TABS: 75.0000 mg | ORAL_TABLET | Freq: Every day | ORAL | Status: DC

## 2011-11-06 NOTE — Telephone Encounter (Signed)
2 Rx sent to pharmacy.      MW

## 2011-11-07 ENCOUNTER — Encounter: Payer: Medicare Other | Admitting: Physical Therapy

## 2011-11-12 ENCOUNTER — Encounter: Payer: Medicare Other | Admitting: Physical Therapy

## 2011-11-13 ENCOUNTER — Ambulatory Visit: Payer: Medicare Other | Admitting: Physical Therapy

## 2011-11-14 ENCOUNTER — Ambulatory Visit: Payer: Medicare Other | Admitting: Physical Therapy

## 2011-11-18 ENCOUNTER — Ambulatory Visit: Payer: Medicare Other | Admitting: Physical Therapy

## 2011-11-20 ENCOUNTER — Ambulatory Visit: Payer: Medicare Other | Admitting: Physical Therapy

## 2011-11-25 ENCOUNTER — Encounter: Payer: Medicare Other | Admitting: Physical Therapy

## 2011-11-27 ENCOUNTER — Ambulatory Visit: Payer: Medicare Other | Admitting: Physical Therapy

## 2011-11-27 ENCOUNTER — Telehealth: Payer: Self-pay | Admitting: Family Medicine

## 2011-11-27 MED ORDER — MIRTAZAPINE 15 MG PO TABS: 15.0000 mg | ORAL_TABLET | Freq: Every day | ORAL | Status: DC

## 2011-11-27 NOTE — Telephone Encounter (Signed)
Noted MD Beverely Low advised the remeron can be re-ordered, sent via escribe, called pt to advise the RX has been sent

## 2011-11-27 NOTE — Telephone Encounter (Signed)
Pts daughter states the pt does not have an appetite and she believes Dr. Beverely Low has written rx for this in the past. She cannot remember the name of it, but would like it called into [facility] on [address].

## 2011-12-04 ENCOUNTER — Encounter: Payer: Medicare Other | Admitting: Physical Therapy

## 2011-12-05 ENCOUNTER — Ambulatory Visit: Payer: Medicare Other | Attending: Surgery | Admitting: Physical Therapy

## 2011-12-05 DIAGNOSIS — R269 Unspecified abnormalities of gait and mobility: Secondary | ICD-10-CM | POA: Insufficient documentation

## 2011-12-05 DIAGNOSIS — IMO0001 Reserved for inherently not codable concepts without codable children: Secondary | ICD-10-CM | POA: Diagnosis not present

## 2011-12-05 DIAGNOSIS — S78119A Complete traumatic amputation at level between unspecified hip and knee, initial encounter: Secondary | ICD-10-CM | POA: Diagnosis not present

## 2011-12-05 DIAGNOSIS — R5381 Other malaise: Secondary | ICD-10-CM | POA: Diagnosis not present

## 2011-12-05 DIAGNOSIS — M6281 Muscle weakness (generalized): Secondary | ICD-10-CM | POA: Diagnosis not present

## 2011-12-06 ENCOUNTER — Ambulatory Visit: Payer: Medicare Other | Admitting: Physical Therapy

## 2011-12-06 DIAGNOSIS — R269 Unspecified abnormalities of gait and mobility: Secondary | ICD-10-CM | POA: Diagnosis not present

## 2011-12-06 DIAGNOSIS — S78119A Complete traumatic amputation at level between unspecified hip and knee, initial encounter: Secondary | ICD-10-CM | POA: Diagnosis not present

## 2011-12-06 DIAGNOSIS — R5381 Other malaise: Secondary | ICD-10-CM | POA: Diagnosis not present

## 2011-12-06 DIAGNOSIS — M6281 Muscle weakness (generalized): Secondary | ICD-10-CM | POA: Diagnosis not present

## 2011-12-06 DIAGNOSIS — IMO0001 Reserved for inherently not codable concepts without codable children: Secondary | ICD-10-CM | POA: Diagnosis not present

## 2011-12-09 ENCOUNTER — Ambulatory Visit: Payer: Medicare Other | Admitting: Physical Therapy

## 2011-12-09 DIAGNOSIS — IMO0001 Reserved for inherently not codable concepts without codable children: Secondary | ICD-10-CM | POA: Diagnosis not present

## 2011-12-09 DIAGNOSIS — M6281 Muscle weakness (generalized): Secondary | ICD-10-CM | POA: Diagnosis not present

## 2011-12-09 DIAGNOSIS — R5381 Other malaise: Secondary | ICD-10-CM | POA: Diagnosis not present

## 2011-12-09 DIAGNOSIS — R269 Unspecified abnormalities of gait and mobility: Secondary | ICD-10-CM | POA: Diagnosis not present

## 2011-12-09 DIAGNOSIS — S78119A Complete traumatic amputation at level between unspecified hip and knee, initial encounter: Secondary | ICD-10-CM | POA: Diagnosis not present

## 2011-12-11 ENCOUNTER — Ambulatory Visit: Payer: Medicare Other | Admitting: Physical Therapy

## 2011-12-11 DIAGNOSIS — M6281 Muscle weakness (generalized): Secondary | ICD-10-CM | POA: Diagnosis not present

## 2011-12-11 DIAGNOSIS — R5381 Other malaise: Secondary | ICD-10-CM | POA: Diagnosis not present

## 2011-12-11 DIAGNOSIS — S78119A Complete traumatic amputation at level between unspecified hip and knee, initial encounter: Secondary | ICD-10-CM | POA: Diagnosis not present

## 2011-12-11 DIAGNOSIS — IMO0001 Reserved for inherently not codable concepts without codable children: Secondary | ICD-10-CM | POA: Diagnosis not present

## 2011-12-11 DIAGNOSIS — R269 Unspecified abnormalities of gait and mobility: Secondary | ICD-10-CM | POA: Diagnosis not present

## 2011-12-16 ENCOUNTER — Ambulatory Visit: Payer: Medicare Other | Admitting: Physical Therapy

## 2011-12-16 DIAGNOSIS — R5381 Other malaise: Secondary | ICD-10-CM | POA: Diagnosis not present

## 2011-12-16 DIAGNOSIS — IMO0001 Reserved for inherently not codable concepts without codable children: Secondary | ICD-10-CM | POA: Diagnosis not present

## 2011-12-16 DIAGNOSIS — R269 Unspecified abnormalities of gait and mobility: Secondary | ICD-10-CM | POA: Diagnosis not present

## 2011-12-16 DIAGNOSIS — S78119A Complete traumatic amputation at level between unspecified hip and knee, initial encounter: Secondary | ICD-10-CM | POA: Diagnosis not present

## 2011-12-16 DIAGNOSIS — M6281 Muscle weakness (generalized): Secondary | ICD-10-CM | POA: Diagnosis not present

## 2011-12-18 ENCOUNTER — Ambulatory Visit: Payer: Medicare Other | Admitting: Physical Therapy

## 2011-12-18 DIAGNOSIS — M6281 Muscle weakness (generalized): Secondary | ICD-10-CM | POA: Diagnosis not present

## 2011-12-18 DIAGNOSIS — R5381 Other malaise: Secondary | ICD-10-CM | POA: Diagnosis not present

## 2011-12-18 DIAGNOSIS — IMO0001 Reserved for inherently not codable concepts without codable children: Secondary | ICD-10-CM | POA: Diagnosis not present

## 2011-12-18 DIAGNOSIS — R269 Unspecified abnormalities of gait and mobility: Secondary | ICD-10-CM | POA: Diagnosis not present

## 2011-12-18 DIAGNOSIS — S78119A Complete traumatic amputation at level between unspecified hip and knee, initial encounter: Secondary | ICD-10-CM | POA: Diagnosis not present

## 2011-12-23 ENCOUNTER — Ambulatory Visit: Payer: Medicare Other | Admitting: Physical Therapy

## 2011-12-23 ENCOUNTER — Other Ambulatory Visit: Payer: Self-pay | Admitting: Family Medicine

## 2011-12-23 DIAGNOSIS — R5381 Other malaise: Secondary | ICD-10-CM | POA: Diagnosis not present

## 2011-12-23 DIAGNOSIS — R269 Unspecified abnormalities of gait and mobility: Secondary | ICD-10-CM | POA: Diagnosis not present

## 2011-12-23 DIAGNOSIS — IMO0001 Reserved for inherently not codable concepts without codable children: Secondary | ICD-10-CM | POA: Diagnosis not present

## 2011-12-23 DIAGNOSIS — S78119A Complete traumatic amputation at level between unspecified hip and knee, initial encounter: Secondary | ICD-10-CM | POA: Diagnosis not present

## 2011-12-23 DIAGNOSIS — M6281 Muscle weakness (generalized): Secondary | ICD-10-CM | POA: Diagnosis not present

## 2011-12-23 MED ORDER — ALENDRONATE SODIUM 70 MG PO TABS: 70.0000 mg | ORAL_TABLET | ORAL | Status: DC

## 2011-12-23 NOTE — Telephone Encounter (Signed)
Refill done.  

## 2011-12-23 NOTE — Telephone Encounter (Signed)
refill FOSAMAX 70 MG Take 1 tablet (70 mg total) by mouth every 7 (seven) days. Take with a full glass of water on an empty stomach.  Taken on Wednesdays # 4 last fill 10.24.13

## 2011-12-24 ENCOUNTER — Encounter: Payer: Self-pay | Admitting: Family Medicine

## 2011-12-24 ENCOUNTER — Encounter: Payer: Medicare Other | Admitting: Physical Therapy

## 2011-12-24 ENCOUNTER — Ambulatory Visit (INDEPENDENT_AMBULATORY_CARE_PROVIDER_SITE_OTHER): Payer: Medicare Other | Admitting: Family Medicine

## 2011-12-24 VITALS — BP 118/82 | HR 67 | Temp 97.9°F

## 2011-12-24 DIAGNOSIS — L988 Other specified disorders of the skin and subcutaneous tissue: Secondary | ICD-10-CM

## 2011-12-24 DIAGNOSIS — L0291 Cutaneous abscess, unspecified: Secondary | ICD-10-CM

## 2011-12-24 DIAGNOSIS — L0390 Cellulitis, unspecified: Secondary | ICD-10-CM | POA: Diagnosis not present

## 2011-12-24 DIAGNOSIS — T148XXA Other injury of unspecified body region, initial encounter: Secondary | ICD-10-CM

## 2011-12-24 MED ORDER — CEFTRIAXONE SODIUM 1 G IJ SOLR
1.0000 g | Freq: Once | INTRAMUSCULAR | Status: AC
  Administered 2011-12-24: 1 g via INTRAMUSCULAR

## 2011-12-24 MED ORDER — CEPHALEXIN 500 MG PO CAPS: 500.0000 mg | ORAL_CAPSULE | Freq: Two times a day (BID) | ORAL | Status: DC

## 2011-12-24 NOTE — Assessment & Plan Note (Signed)
Resolved last time with keflex Pt will start abx and rto 1 week

## 2011-12-24 NOTE — Progress Notes (Signed)
  Subjective:    Patient ID: Christina Pittman, female    DOB: [birth year], 78 y.o.   MRN: [id number]  HPI Pt here with daughter c/o another blood blister L low leg.   Area is tender to touch.      Review of Systems As above    Objective:   Physical Exam BP 118/82  Pulse 67  Temp 97.9 F (36.6 C) (Oral)  SpO2 97% General appearance: cooperative and appears stated age Extremities: Left low leg--- + blood blister with surrounding errythema and warm to touch Skin: see above       Assessment & Plan:

## 2011-12-24 NOTE — Patient Instructions (Signed)
Cellulitis Cellulitis is an infection of the skin and the tissue beneath it. The infected area is usually red and tender. Cellulitis occurs most often in the arms and lower legs.   CAUSES   Cellulitis is caused by bacteria that enter the skin through cracks or cuts in the skin. The most common types of bacteria that cause cellulitis are Staphylococcus and Streptococcus. SYMPTOMS    Redness and warmth.   Swelling.   Tenderness or pain.   Fever.  DIAGNOSIS  Your caregiver can usually determine what is wrong based on a physical exam. Blood tests may also be done. TREATMENT   Treatment usually involves taking an antibiotic medicine. HOME CARE INSTRUCTIONS    Take your antibiotics as directed. Finish them even if you start to feel better.   Keep the infected arm or leg elevated to reduce swelling.   Apply a warm cloth to the affected area up to 4 times per day to relieve pain.   Only take over-the-counter or prescription medicines for pain, discomfort, or fever as directed by your caregiver.   Keep all follow-up appointments as directed by your caregiver.  SEEK MEDICAL CARE IF:    You notice red streaks coming from the infected area.   Your red area gets larger or turns dark in color.   Your bone or joint underneath the infected area becomes painful after the skin has healed.   Your infection returns in the same area or another area.   You notice a swollen bump in the infected area.   You develop new symptoms.  SEEK IMMEDIATE MEDICAL CARE IF:    You have a fever.   You feel very sleepy.   You develop vomiting or diarrhea.   You have a general ill feeling (malaise) with muscle aches and pains.  MAKE SURE YOU:    Understand these instructions.   Will watch your condition.   Will get help right away if you are not doing well or get worse.  Document Released: 10/24/2004 Document Revised: 07/16/2011 Document Reviewed: 04/01/2011 [employer] Patient Information 2013  [employer]    

## 2011-12-24 NOTE — Assessment & Plan Note (Signed)
Keflex for 10 days F/u next week or sooner prn

## 2011-12-30 ENCOUNTER — Ambulatory Visit: Payer: Medicare Other | Attending: Surgery | Admitting: Physical Therapy

## 2011-12-30 DIAGNOSIS — R5381 Other malaise: Secondary | ICD-10-CM | POA: Insufficient documentation

## 2011-12-30 DIAGNOSIS — M6281 Muscle weakness (generalized): Secondary | ICD-10-CM | POA: Diagnosis not present

## 2011-12-30 DIAGNOSIS — S78119A Complete traumatic amputation at level between unspecified hip and knee, initial encounter: Secondary | ICD-10-CM | POA: Insufficient documentation

## 2011-12-30 DIAGNOSIS — R269 Unspecified abnormalities of gait and mobility: Secondary | ICD-10-CM | POA: Insufficient documentation

## 2011-12-30 DIAGNOSIS — IMO0001 Reserved for inherently not codable concepts without codable children: Secondary | ICD-10-CM | POA: Insufficient documentation

## 2011-12-31 ENCOUNTER — Ambulatory Visit (INDEPENDENT_AMBULATORY_CARE_PROVIDER_SITE_OTHER): Payer: Medicare Other | Admitting: Family Medicine

## 2011-12-31 ENCOUNTER — Encounter: Payer: Self-pay | Admitting: Family Medicine

## 2011-12-31 VITALS — BP 116/70 | HR 70 | Temp 98.4°F

## 2011-12-31 DIAGNOSIS — L988 Other specified disorders of the skin and subcutaneous tissue: Secondary | ICD-10-CM

## 2011-12-31 DIAGNOSIS — T148XXA Other injury of unspecified body region, initial encounter: Secondary | ICD-10-CM

## 2011-12-31 NOTE — Progress Notes (Signed)
  Subjective:    Patient ID: Christina Pittman, female    DOB: 20-Sep-1919, 78 y.o.   MRN: [id number]  HPI L leg wound- pt was seen on 11/26 for blister on L calf.  Area has since drained and is now scabbed.  Has 1 day of keflex remaining.  No fevers.  Only painful to press on.  Daughter reports area is much improved.   Review of Systems For ROS see HPI     Objective:   Physical Exam  Vitals reviewed. Constitutional: She appears well-developed and well-nourished. No distress.  Skin: Skin is warm and dry.       2x3 cm area of bruising w/ thin top overlying layer from where blister drained.  Minimally tender to palpation.  No drainage.  No areas of active infection seen.          Assessment & Plan:

## 2011-12-31 NOTE — Patient Instructions (Addendum)
Follow up in 1 week to recheck your blister It looks better! Try and avoid pressure on the area Continue to keep it clean and dry Call with any questions or concerns Hang in there!

## 2011-12-31 NOTE — Assessment & Plan Note (Signed)
Improving-  Pt is understandably anxious about infection in her leg due to recent amputation of R leg.  Area remains dark, almost bruise like, but no active infxn seen.  Will continue to follow closely.  Reviewed supportive care and red flags that should prompt return.  Pt expressed understanding and is in agreement w/ plan. r

## 2012-01-01 ENCOUNTER — Encounter: Payer: Medicare Other | Admitting: Physical Therapy

## 2012-01-06 ENCOUNTER — Encounter: Payer: Medicare Other | Admitting: Physical Therapy

## 2012-01-08 ENCOUNTER — Encounter: Payer: Self-pay | Admitting: Family Medicine

## 2012-01-08 ENCOUNTER — Ambulatory Visit (INDEPENDENT_AMBULATORY_CARE_PROVIDER_SITE_OTHER): Payer: Medicare Other | Admitting: Family Medicine

## 2012-01-08 VITALS — BP 110/70 | HR 68 | Temp 98.7°F

## 2012-01-08 DIAGNOSIS — T148XXA Other injury of unspecified body region, initial encounter: Secondary | ICD-10-CM

## 2012-01-08 DIAGNOSIS — J309 Allergic rhinitis, unspecified: Secondary | ICD-10-CM

## 2012-01-08 DIAGNOSIS — L988 Other specified disorders of the skin and subcutaneous tissue: Secondary | ICD-10-CM | POA: Diagnosis not present

## 2012-01-08 NOTE — Assessment & Plan Note (Signed)
Improved.  No evidence of current infxn.  No evidence of underlying deep tissue injury.  Will continue to follow.

## 2012-01-08 NOTE — Patient Instructions (Addendum)
Follow up in 1 week to recheck leg and diabetes You look great!  Keep up the good work! Add Claritin or Zyrtec daily for the allergies and post nasal drip Call with any questions or concerns Hang in there!!!

## 2012-01-08 NOTE — Progress Notes (Signed)
  Subjective:    Patient ID: Christina Pittman, female    DOB: Sep 24, 1919, 78 y.o.   MRN: [id number]  HPI Pt comes for f/u on blister on L lower leg.  Area is now well scabbed and less painful.  Daughter and grand daughter feel it is much improved.  Dry cough- sxs x1 month.  No fever, chills.  + nasal congestion, PND.  No facial pain/pressure.   Review of Systems For ROS see HPI     Objective:   Physical Exam  Vitals reviewed. Constitutional: She appears well-developed and well-nourished. No distress.  HENT:  Head: Normocephalic and atraumatic.  Right Ear: Tympanic membrane normal.  Left Ear: Tympanic membrane normal.  Nose: Mucosal edema and rhinorrhea present. Right sinus exhibits no maxillary sinus tenderness and no frontal sinus tenderness. Left sinus exhibits no maxillary sinus tenderness and no frontal sinus tenderness.  Mouth/Throat: Mucous membranes are normal. Posterior oropharyngeal erythema (w/ PND) present.  Eyes: Conjunctivae normal and EOM are normal. Pupils are equal, round, and reactive to light.  Neck: Normal range of motion. Neck supple.  Cardiovascular: Normal rate, regular rhythm and normal heart sounds.   Pulmonary/Chest: Effort normal and breath sounds normal. No respiratory distress. She has no wheezes. She has no rales.  Musculoskeletal: She exhibits edema (1-2+ L ankle/foot).  Lymphadenopathy:    She has no cervical adenopathy.  Skin: Skin is warm and dry.       2.5 cm x 1.5 cm scabbed, well circumscribed area on L medial lower leg.  Not tender, no drainage.          Assessment & Plan:

## 2012-01-08 NOTE — Assessment & Plan Note (Signed)
New.  Pt's cough not infectious, due to PND.  Recommended OTC anti-histamine.  Pt expressed understanding and is in agreement w/ plan.

## 2012-01-09 ENCOUNTER — Ambulatory Visit: Payer: Medicare Other | Admitting: Physical Therapy

## 2012-01-13 ENCOUNTER — Ambulatory Visit: Payer: Medicare Other | Admitting: Physical Therapy

## 2012-01-15 ENCOUNTER — Ambulatory Visit: Payer: Medicare Other | Admitting: Physical Therapy

## 2012-01-15 ENCOUNTER — Encounter: Payer: Self-pay | Admitting: Family Medicine

## 2012-01-15 ENCOUNTER — Ambulatory Visit (INDEPENDENT_AMBULATORY_CARE_PROVIDER_SITE_OTHER): Payer: Medicare Other | Admitting: Family Medicine

## 2012-01-15 VITALS — BP 120/80 | HR 71 | Temp 98.4°F

## 2012-01-15 DIAGNOSIS — E119 Type 2 diabetes mellitus without complications: Secondary | ICD-10-CM

## 2012-01-15 DIAGNOSIS — L988 Other specified disorders of the skin and subcutaneous tissue: Secondary | ICD-10-CM

## 2012-01-15 DIAGNOSIS — E785 Hyperlipidemia, unspecified: Secondary | ICD-10-CM | POA: Diagnosis not present

## 2012-01-15 DIAGNOSIS — T148XXA Other injury of unspecified body region, initial encounter: Secondary | ICD-10-CM

## 2012-01-15 LAB — HEPATIC FUNCTION PANEL
AST: 37 U/L (ref 0–37)
Albumin: 3.3 g/dL — ABNORMAL LOW (ref 3.5–5.2)
Alkaline Phosphatase: 74 U/L (ref 39–117)

## 2012-01-15 LAB — LIPID PANEL
LDL Cholesterol: 70 mg/dL (ref 0–99)
Total CHOL/HDL Ratio: 3
VLDL: 30.2 mg/dL (ref 0.0–40.0)

## 2012-01-15 LAB — BASIC METABOLIC PANEL
GFR: 66.69 mL/min (ref >60.00)
Glucose, Bld: 200 mg/dL — ABNORMAL HIGH (ref 70–99)
Potassium: 4.4 mEq/L (ref 3.5–5.1)
Sodium: 138 mEq/L (ref 135–145)

## 2012-01-15 NOTE — Assessment & Plan Note (Signed)
Unchanged.  Have some concerns that thickness of pt's scab is now actually impeding healing process.  Will start home health nursing for wound care to try and debride leg.  Offered wound clinic but pt declined.  No evidence of current infxn so no abx needed.  Will continue to follow closely.

## 2012-01-15 NOTE — Progress Notes (Signed)
  Subjective:    Patient ID: Christina Pittman, female    DOB: 05-29-1919, 78 y.o.   MRN: [id number]  HPI Hyperlipidemia- chronic problem, on Zocor.  Denies abd pain, N/V, myalgias.  DM- chronic problem, on Glimepiride if fasting CBG >90.  Denies symptomatic lows- no nausea, vomiting, dizziness, shaking.  Blood Blister- pt has had area on leg for 1 month.  Scab intact.  Still pain w/ direct pressure.  No drainage.  Pt very fearful of losing other leg to gangrene.   Review of Systems For ROS see HPI     Objective:   Physical Exam  Vitals reviewed. Constitutional: She appears well-developed and well-nourished. No distress.  HENT:  Head: Normocephalic and atraumatic.  Neck: Normal range of motion. Neck supple. No thyromegaly present.  Cardiovascular: Normal rate and regular rhythm.   Murmur (III/VI SEM due to aortic stenosis) heard. Pulmonary/Chest: Effort normal and breath sounds normal. No respiratory distress. She has no wheezes. She has no rales.  Abdominal: Soft. Bowel sounds are normal. She exhibits no distension. There is no tenderness.  Musculoskeletal: She exhibits edema.  Skin: Skin is warm and dry.       2 x 2 cm heaped, dark scab on L lower leg.  + TTP, no surrounding erythema, no drainage.          Assessment & Plan:

## 2012-01-15 NOTE — Assessment & Plan Note (Signed)
Chronic problem.  Recently has been well controlled w/ diet and minimal medication.  Check labs.  Adjust meds prn.  Reviewed supportive care and red flags that should prompt return.

## 2012-01-15 NOTE — Patient Instructions (Addendum)
Follow up 3-4 weeks to recheck leg We'll send [facility] out to take care of the scab We'll notify you of your lab results and make any changes if needed Call with any questions or concerns Happy Holidays!!!

## 2012-01-15 NOTE — Assessment & Plan Note (Signed)
Chronic problem.  Tolerating statin w/out difficulty.  Check labs.  Adjust meds prn  

## 2012-01-20 ENCOUNTER — Ambulatory Visit: Payer: Medicare Other | Admitting: Physical Therapy

## 2012-01-20 ENCOUNTER — Encounter: Payer: Self-pay | Admitting: *Deleted

## 2012-01-23 ENCOUNTER — Telehealth: Payer: Self-pay | Admitting: Family Medicine

## 2012-01-23 ENCOUNTER — Ambulatory Visit: Payer: Medicare Other | Admitting: Physical Therapy

## 2012-01-23 NOTE — Telephone Encounter (Signed)
Please advise on RF request.  No Bone Density on file.//AB/CMA  Referral for Home Health and been faxed.//AB/CMA

## 2012-01-23 NOTE — Telephone Encounter (Signed)
Refill: Fosamax 70 mg tab. Take one tablet by mouth once a week. Take with a full glass of water on an empty stomach. Taken on Wednesdays. Qty 4. Last fill 12-23-11

## 2012-01-23 NOTE — Telephone Encounter (Signed)
Message copied by Verdie Shire on Thu Jan 23, 2012  5:34 PM ------      Message from: Donzetta Kohut B      Created: Wed Jan 15, 2012  2:32 PM      Regarding: HOME HEALTH REFERRAL       Marylene Land,            [place]...Marland KitchenMarland KitchenMarland Kitchen  Dr. Beverely Low has just entered a Home Health Referral for this patient.  Once you have completed, will you please let me know so I can remove from my work que?            Thank you :))

## 2012-01-24 MED ORDER — ALENDRONATE SODIUM 70 MG PO TABS: 70.0000 mg | ORAL_TABLET | ORAL | Status: DC

## 2012-01-24 NOTE — Telephone Encounter (Signed)
Rx sent to pharmacy by e-script.//AB/CMA 

## 2012-01-24 NOTE — Telephone Encounter (Signed)
Ok for #4, 3 refills.  Will discuss bone density at next OV

## 2012-01-27 ENCOUNTER — Ambulatory Visit: Payer: Medicare Other | Admitting: Physical Therapy

## 2012-01-28 ENCOUNTER — Telehealth: Payer: Self-pay | Admitting: *Deleted

## 2012-01-28 NOTE — Telephone Encounter (Signed)
Message copied by Verdie Shire on Tue Jan 28, 2012  3:54 PM ------      Message from: Donzetta Kohut B      Created: Wed Jan 15, 2012  2:32 PM      Regarding: HOME HEALTH REFERRAL       Marylene Land,            [place]...Marland KitchenMarland KitchenMarland Kitchen  Dr. Beverely Low has just entered a Home Health Referral for this patient.  Once you have completed, will you please let me know so I can remove from my work que?            Thank you :))

## 2012-01-28 NOTE — Telephone Encounter (Signed)
Referral to Home Health [facility]) was faxed.//AB/CMA

## 2012-01-31 ENCOUNTER — Ambulatory Visit: Payer: Medicare Other | Admitting: Physical Therapy

## 2012-02-03 ENCOUNTER — Ambulatory Visit: Payer: Medicare Other | Attending: Surgery | Admitting: Physical Therapy

## 2012-02-03 DIAGNOSIS — IMO0001 Reserved for inherently not codable concepts without codable children: Secondary | ICD-10-CM | POA: Diagnosis not present

## 2012-02-03 DIAGNOSIS — M6281 Muscle weakness (generalized): Secondary | ICD-10-CM | POA: Diagnosis not present

## 2012-02-03 DIAGNOSIS — R5381 Other malaise: Secondary | ICD-10-CM | POA: Insufficient documentation

## 2012-02-03 DIAGNOSIS — R269 Unspecified abnormalities of gait and mobility: Secondary | ICD-10-CM | POA: Insufficient documentation

## 2012-02-03 DIAGNOSIS — S78119A Complete traumatic amputation at level between unspecified hip and knee, initial encounter: Secondary | ICD-10-CM | POA: Insufficient documentation

## 2012-02-04 ENCOUNTER — Other Ambulatory Visit (INDEPENDENT_AMBULATORY_CARE_PROVIDER_SITE_OTHER): Payer: Medicare Other

## 2012-02-04 DIAGNOSIS — R410 Disorientation, unspecified: Secondary | ICD-10-CM

## 2012-02-04 DIAGNOSIS — F29 Unspecified psychosis not due to a substance or known physiological condition: Secondary | ICD-10-CM | POA: Diagnosis not present

## 2012-02-04 DIAGNOSIS — R300 Dysuria: Secondary | ICD-10-CM | POA: Diagnosis not present

## 2012-02-04 LAB — POCT URINALYSIS DIPSTICK
Blood, UA: NEGATIVE
Protein, UA: NEGATIVE
Spec Grav, UA: 1.02
Urobilinogen, UA: 0.2
pH, UA: 6.5

## 2012-02-06 ENCOUNTER — Telehealth: Payer: Self-pay | Admitting: Family Medicine

## 2012-02-06 ENCOUNTER — Ambulatory Visit: Payer: Medicare Other | Admitting: Physical Therapy

## 2012-02-06 DIAGNOSIS — R5381 Other malaise: Secondary | ICD-10-CM | POA: Diagnosis not present

## 2012-02-06 DIAGNOSIS — S78119A Complete traumatic amputation at level between unspecified hip and knee, initial encounter: Secondary | ICD-10-CM | POA: Diagnosis not present

## 2012-02-06 DIAGNOSIS — IMO0001 Reserved for inherently not codable concepts without codable children: Secondary | ICD-10-CM | POA: Diagnosis not present

## 2012-02-06 DIAGNOSIS — R269 Unspecified abnormalities of gait and mobility: Secondary | ICD-10-CM | POA: Diagnosis not present

## 2012-02-06 DIAGNOSIS — M6281 Muscle weakness (generalized): Secondary | ICD-10-CM | POA: Diagnosis not present

## 2012-02-06 DIAGNOSIS — L0390 Cellulitis, unspecified: Secondary | ICD-10-CM

## 2012-02-06 LAB — URINE CULTURE: Colony Count: 40000

## 2012-02-06 MED ORDER — CEPHALEXIN 500 MG PO CAPS: 500.0000 mg | ORAL_CAPSULE | Freq: Two times a day (BID) | ORAL | Status: DC

## 2012-02-06 NOTE — Telephone Encounter (Signed)
Marieke's daughter [name] called in and would like to know if urine and lab results have come back. She doesn't want to bother anyone but her mom is driving her crazy. She would like someone to call her

## 2012-02-06 NOTE — Telephone Encounter (Signed)
Spoke with pt's daughter [name]) and informed her of the pt's recent UC results.   New Rx sent to the pharmacy by e-script.//AB/CMA

## 2012-02-06 NOTE — Telephone Encounter (Signed)
Message copied by Verdie Shire on Thu Feb 06, 2012  5:16 PM ------      Message from: Sheliah Hatch      Created: Thu Feb 06, 2012  8:45 AM       Should start Keflex 500mg  bid x5 days for enterococcus in urine

## 2012-02-10 ENCOUNTER — Ambulatory Visit: Payer: Medicare Other | Admitting: Physical Therapy

## 2012-02-10 DIAGNOSIS — R5381 Other malaise: Secondary | ICD-10-CM | POA: Diagnosis not present

## 2012-02-10 DIAGNOSIS — M6281 Muscle weakness (generalized): Secondary | ICD-10-CM | POA: Diagnosis not present

## 2012-02-10 DIAGNOSIS — R269 Unspecified abnormalities of gait and mobility: Secondary | ICD-10-CM | POA: Diagnosis not present

## 2012-02-10 DIAGNOSIS — IMO0001 Reserved for inherently not codable concepts without codable children: Secondary | ICD-10-CM | POA: Diagnosis not present

## 2012-02-10 DIAGNOSIS — S78119A Complete traumatic amputation at level between unspecified hip and knee, initial encounter: Secondary | ICD-10-CM | POA: Diagnosis not present

## 2012-02-13 ENCOUNTER — Ambulatory Visit: Payer: Medicare Other | Admitting: Physical Therapy

## 2012-02-13 DIAGNOSIS — R269 Unspecified abnormalities of gait and mobility: Secondary | ICD-10-CM | POA: Diagnosis not present

## 2012-02-13 DIAGNOSIS — IMO0001 Reserved for inherently not codable concepts without codable children: Secondary | ICD-10-CM | POA: Diagnosis not present

## 2012-02-13 DIAGNOSIS — M6281 Muscle weakness (generalized): Secondary | ICD-10-CM | POA: Diagnosis not present

## 2012-02-13 DIAGNOSIS — S78119A Complete traumatic amputation at level between unspecified hip and knee, initial encounter: Secondary | ICD-10-CM | POA: Diagnosis not present

## 2012-02-13 DIAGNOSIS — R5381 Other malaise: Secondary | ICD-10-CM | POA: Diagnosis not present

## 2012-02-14 DIAGNOSIS — I69939 Monoplegia of upper limb following unspecified cerebrovascular disease affecting unspecified side: Secondary | ICD-10-CM

## 2012-02-14 DIAGNOSIS — I872 Venous insufficiency (chronic) (peripheral): Secondary | ICD-10-CM | POA: Diagnosis not present

## 2012-02-14 DIAGNOSIS — E119 Type 2 diabetes mellitus without complications: Secondary | ICD-10-CM | POA: Diagnosis not present

## 2012-02-14 DIAGNOSIS — L988 Other specified disorders of the skin and subcutaneous tissue: Secondary | ICD-10-CM | POA: Diagnosis not present

## 2012-02-17 ENCOUNTER — Ambulatory Visit: Payer: Medicare Other | Admitting: Physical Therapy

## 2012-02-18 ENCOUNTER — Other Ambulatory Visit: Payer: Self-pay | Admitting: Family Medicine

## 2012-02-19 ENCOUNTER — Ambulatory Visit: Payer: Medicare Other | Admitting: Family Medicine

## 2012-02-20 ENCOUNTER — Encounter: Payer: Medicare Other | Admitting: Physical Therapy

## 2012-02-24 ENCOUNTER — Ambulatory Visit: Payer: Medicare Other | Admitting: Family Medicine

## 2012-02-25 ENCOUNTER — Ambulatory Visit: Payer: Medicare Other | Admitting: Physical Therapy

## 2012-02-27 ENCOUNTER — Ambulatory Visit (INDEPENDENT_AMBULATORY_CARE_PROVIDER_SITE_OTHER): Payer: Medicare Other | Admitting: Family Medicine

## 2012-02-27 ENCOUNTER — Ambulatory Visit: Payer: Medicare Other | Admitting: Physical Therapy

## 2012-02-27 ENCOUNTER — Encounter: Payer: Self-pay | Admitting: Family Medicine

## 2012-02-27 VITALS — BP 130/80 | HR 59 | Temp 97.8°F

## 2012-02-27 DIAGNOSIS — H5316 Psychophysical visual disturbances: Secondary | ICD-10-CM

## 2012-02-27 DIAGNOSIS — R441 Visual hallucinations: Secondary | ICD-10-CM

## 2012-02-27 DIAGNOSIS — L988 Other specified disorders of the skin and subcutaneous tissue: Secondary | ICD-10-CM

## 2012-02-27 DIAGNOSIS — T148XXA Other injury of unspecified body region, initial encounter: Secondary | ICD-10-CM

## 2012-02-27 NOTE — Patient Instructions (Addendum)
Follow up in 2 months to recheck diabetes Consider calling Dr Donell Beers at [phone] to set up an appt If her behavior worsens or scares you- [facility] has a Geriatric Psych facility The leg looks good! Call with any questions or concerns Hang in there!!

## 2012-02-27 NOTE — Assessment & Plan Note (Signed)
New.  Pt also having auditory hallucinations.  Strongly encouraged pt and family to see psych for complete evaluation and tx.  Pt adamantly against this.  Informed family of [facility] if pt gets too angry, combative, or confused.  Will continue to follow.

## 2012-02-27 NOTE — Assessment & Plan Note (Signed)
Improved.  Scab remains in place but area is well healing.  No need for additional tx.

## 2012-02-27 NOTE — Progress Notes (Signed)
  Subjective:    Patient ID: Christina Pittman, female    DOB: 02/27/1919, 79 y.o.   MRN: [id number]  HPI L leg blister- pt and family report 'much improved'  No pain, drainage, swelling, streaking redness.  Scab remains in place  Auditory/Visual hallucinations- pt has African caregiver that stays w/ her at night.  Pt feels she does 'voodoo magic' and will 'move my furniture at night.  Not w/ her hands, but w/ a spell'.  Tells family she will wake up in different bed (not true per family's report).  Family also indicates that furniture is not moved.  The other day kept telling caregiver that she had leeches on her and was trying to pull off her eyebrow which she thought was a leech.  Told family she heard voices arguing outside her door- family reports no one was there.   Review of Systems For ROS see HPI     Objective:   Physical Exam  Vitals reviewed. Constitutional: She appears well-developed and well-nourished. No distress.  Musculoskeletal: She exhibits edema (1+ pitting edema of LLE).  Skin: Skin is warm and dry.       2.5 cm circular scab on medial L lower leg w/out surrounding erythema, induration, or TTP  Psychiatric: She has a normal mood and affect. Her behavior is normal.       Pt keeps stating 'i'm not crazy', 'it will be a cold day in hell before i go see one of those crazy doctors'.          Assessment & Plan:

## 2012-02-28 ENCOUNTER — Telehealth: Payer: Self-pay | Admitting: *Deleted

## 2012-02-28 ENCOUNTER — Other Ambulatory Visit (INDEPENDENT_AMBULATORY_CARE_PROVIDER_SITE_OTHER): Payer: Medicare Other

## 2012-02-28 DIAGNOSIS — N390 Urinary tract infection, site not specified: Secondary | ICD-10-CM

## 2012-02-28 DIAGNOSIS — L0390 Cellulitis, unspecified: Secondary | ICD-10-CM

## 2012-02-28 LAB — POCT URINALYSIS DIPSTICK
Blood, UA: NEGATIVE
Ketones, UA: NEGATIVE
Protein, UA: NEGATIVE
Spec Grav, UA: >=1.03
pH, UA: 6

## 2012-02-28 MED ORDER — CEPHALEXIN 500 MG PO CAPS: 500.0000 mg | ORAL_CAPSULE | Freq: Two times a day (BID) | ORAL | Status: DC

## 2012-02-28 NOTE — Telephone Encounter (Signed)
Message copied by Verdie Shire on Fri Feb 28, 2012  5:04 PM ------      Message from: Sheliah Hatch      Created: Fri Feb 28, 2012  5:00 PM       Can start Keflex 500mg  BID x5 days for UTI while we wait on urine culture

## 2012-02-28 NOTE — Telephone Encounter (Signed)
Spoke with the pt's daughter(Gail) and informed her of pt's recent UA results.  Informed her Dr. Beverely Low would like to start the pt on Keflex 500mg  BID x 5 days for UTI while we wait on urine culture.  The daughter understood and agreed.  Rx sent to the pharmacy by e-script.//AB/CMA

## 2012-03-02 ENCOUNTER — Ambulatory Visit: Payer: Medicare Other | Admitting: Family Medicine

## 2012-03-02 LAB — URINE CULTURE

## 2012-03-04 ENCOUNTER — Telehealth: Payer: Self-pay | Admitting: *Deleted

## 2012-03-04 NOTE — Telephone Encounter (Signed)
Pt's daughter(Gail) called requesting pt recent lab results.  Informed her the UC was positive it showed multiple bacterial,but she on the correct ATB.  Also informed if the pt is still having sxs we will need to recheck her per Dr.Tabori.  Daughter stated that the pt stated that she's not having any more burning.  She stated that the pt is doing better.//AB/CMA

## 2012-03-06 ENCOUNTER — Telehealth: Payer: Self-pay | Admitting: Family Medicine

## 2012-03-06 NOTE — Telephone Encounter (Signed)
Pt would need to come in for repeat UA b/c her culture was indeterminate.

## 2012-03-06 NOTE — Telephone Encounter (Signed)
Patients daughter states the patient is requesting another round of antibiotics. Her infection has not completely cleared. Pt uses [facility] on [address]

## 2012-03-06 NOTE — Telephone Encounter (Signed)
Discuss with patient daughter [name]

## 2012-03-07 ENCOUNTER — Other Ambulatory Visit: Payer: Self-pay | Admitting: Family Medicine

## 2012-03-09 NOTE — Telephone Encounter (Signed)
Rx sent to the pharmacy([facility]) by e-script.//AB/CMA

## 2012-04-21 ENCOUNTER — Telehealth: Payer: Self-pay | Admitting: Family Medicine

## 2012-04-21 DIAGNOSIS — R296 Repeated falls: Secondary | ICD-10-CM

## 2012-04-21 NOTE — Telephone Encounter (Signed)
Dr.Tabori please advise if ok to place home health referral, ? Exact DX to place on referral form

## 2012-04-21 NOTE — Telephone Encounter (Signed)
Patient's daughter called requesting referral to [facility] to help the patient walk. She states they have used them before.

## 2012-04-21 NOTE — Telephone Encounter (Signed)
Ok to refer for 'falls at home' (on problem list) but usually insurance won't pay for home health unless we see pt w/in 30 days

## 2012-04-27 ENCOUNTER — Ambulatory Visit: Payer: Medicare Other | Admitting: Family Medicine

## 2012-04-30 NOTE — Telephone Encounter (Signed)
Referral for Home Health was ordered, and referral and information faxed to [facility].//AB/CMA

## 2012-05-01 DIAGNOSIS — IMO0001 Reserved for inherently not codable concepts without codable children: Secondary | ICD-10-CM | POA: Diagnosis not present

## 2012-05-01 DIAGNOSIS — I10 Essential (primary) hypertension: Secondary | ICD-10-CM | POA: Diagnosis not present

## 2012-05-01 DIAGNOSIS — I69959 Hemiplegia and hemiparesis following unspecified cerebrovascular disease affecting unspecified side: Secondary | ICD-10-CM | POA: Diagnosis not present

## 2012-05-01 DIAGNOSIS — E119 Type 2 diabetes mellitus without complications: Secondary | ICD-10-CM | POA: Diagnosis not present

## 2012-05-01 DIAGNOSIS — S78119A Complete traumatic amputation at level between unspecified hip and knee, initial encounter: Secondary | ICD-10-CM | POA: Diagnosis not present

## 2012-05-04 DIAGNOSIS — S78119A Complete traumatic amputation at level between unspecified hip and knee, initial encounter: Secondary | ICD-10-CM | POA: Diagnosis not present

## 2012-05-04 DIAGNOSIS — I10 Essential (primary) hypertension: Secondary | ICD-10-CM | POA: Diagnosis not present

## 2012-05-04 DIAGNOSIS — IMO0001 Reserved for inherently not codable concepts without codable children: Secondary | ICD-10-CM | POA: Diagnosis not present

## 2012-05-04 DIAGNOSIS — E119 Type 2 diabetes mellitus without complications: Secondary | ICD-10-CM | POA: Diagnosis not present

## 2012-05-04 DIAGNOSIS — I69959 Hemiplegia and hemiparesis following unspecified cerebrovascular disease affecting unspecified side: Secondary | ICD-10-CM | POA: Diagnosis not present

## 2012-05-05 DIAGNOSIS — I69959 Hemiplegia and hemiparesis following unspecified cerebrovascular disease affecting unspecified side: Secondary | ICD-10-CM | POA: Diagnosis not present

## 2012-05-05 DIAGNOSIS — IMO0001 Reserved for inherently not codable concepts without codable children: Secondary | ICD-10-CM | POA: Diagnosis not present

## 2012-05-05 DIAGNOSIS — E119 Type 2 diabetes mellitus without complications: Secondary | ICD-10-CM | POA: Diagnosis not present

## 2012-05-05 DIAGNOSIS — S78119A Complete traumatic amputation at level between unspecified hip and knee, initial encounter: Secondary | ICD-10-CM | POA: Diagnosis not present

## 2012-05-05 DIAGNOSIS — I10 Essential (primary) hypertension: Secondary | ICD-10-CM | POA: Diagnosis not present

## 2012-05-06 ENCOUNTER — Telehealth: Payer: Self-pay

## 2012-05-06 NOTE — Telephone Encounter (Signed)
Ok to continue home health PT 

## 2012-05-06 NOTE — Telephone Encounter (Signed)
Msg from [name] who is requesting a verbal to continue home PT. Please advise     KP

## 2012-05-07 NOTE — Telephone Encounter (Signed)
Called [name] at [phone]) leaving verbal orders that Dr. Beverely Low stated it's ok to continue home health PT on the Ms. Christina Pittman.   Asked him to give me a call back if he has any questions.//AB/CMA

## 2012-05-08 DIAGNOSIS — I69959 Hemiplegia and hemiparesis following unspecified cerebrovascular disease affecting unspecified side: Secondary | ICD-10-CM | POA: Diagnosis not present

## 2012-05-08 DIAGNOSIS — I10 Essential (primary) hypertension: Secondary | ICD-10-CM | POA: Diagnosis not present

## 2012-05-08 DIAGNOSIS — E119 Type 2 diabetes mellitus without complications: Secondary | ICD-10-CM | POA: Diagnosis not present

## 2012-05-08 DIAGNOSIS — S78119A Complete traumatic amputation at level between unspecified hip and knee, initial encounter: Secondary | ICD-10-CM | POA: Diagnosis not present

## 2012-05-08 DIAGNOSIS — IMO0001 Reserved for inherently not codable concepts without codable children: Secondary | ICD-10-CM | POA: Diagnosis not present

## 2012-05-11 DIAGNOSIS — IMO0001 Reserved for inherently not codable concepts without codable children: Secondary | ICD-10-CM | POA: Diagnosis not present

## 2012-05-11 DIAGNOSIS — I69959 Hemiplegia and hemiparesis following unspecified cerebrovascular disease affecting unspecified side: Secondary | ICD-10-CM | POA: Diagnosis not present

## 2012-05-11 DIAGNOSIS — S78119A Complete traumatic amputation at level between unspecified hip and knee, initial encounter: Secondary | ICD-10-CM | POA: Diagnosis not present

## 2012-05-11 DIAGNOSIS — E119 Type 2 diabetes mellitus without complications: Secondary | ICD-10-CM | POA: Diagnosis not present

## 2012-05-11 DIAGNOSIS — I10 Essential (primary) hypertension: Secondary | ICD-10-CM | POA: Diagnosis not present

## 2012-05-13 DIAGNOSIS — E119 Type 2 diabetes mellitus without complications: Secondary | ICD-10-CM | POA: Diagnosis not present

## 2012-05-13 DIAGNOSIS — IMO0001 Reserved for inherently not codable concepts without codable children: Secondary | ICD-10-CM | POA: Diagnosis not present

## 2012-05-13 DIAGNOSIS — S78119A Complete traumatic amputation at level between unspecified hip and knee, initial encounter: Secondary | ICD-10-CM | POA: Diagnosis not present

## 2012-05-13 DIAGNOSIS — I10 Essential (primary) hypertension: Secondary | ICD-10-CM | POA: Diagnosis not present

## 2012-05-13 DIAGNOSIS — I69959 Hemiplegia and hemiparesis following unspecified cerebrovascular disease affecting unspecified side: Secondary | ICD-10-CM | POA: Diagnosis not present

## 2012-05-15 DIAGNOSIS — I69959 Hemiplegia and hemiparesis following unspecified cerebrovascular disease affecting unspecified side: Secondary | ICD-10-CM | POA: Diagnosis not present

## 2012-05-15 DIAGNOSIS — I10 Essential (primary) hypertension: Secondary | ICD-10-CM | POA: Diagnosis not present

## 2012-05-15 DIAGNOSIS — E119 Type 2 diabetes mellitus without complications: Secondary | ICD-10-CM | POA: Diagnosis not present

## 2012-05-15 DIAGNOSIS — IMO0001 Reserved for inherently not codable concepts without codable children: Secondary | ICD-10-CM | POA: Diagnosis not present

## 2012-05-15 DIAGNOSIS — S78119A Complete traumatic amputation at level between unspecified hip and knee, initial encounter: Secondary | ICD-10-CM | POA: Diagnosis not present

## 2012-05-18 DIAGNOSIS — S78119A Complete traumatic amputation at level between unspecified hip and knee, initial encounter: Secondary | ICD-10-CM | POA: Diagnosis not present

## 2012-05-18 DIAGNOSIS — I10 Essential (primary) hypertension: Secondary | ICD-10-CM | POA: Diagnosis not present

## 2012-05-18 DIAGNOSIS — IMO0001 Reserved for inherently not codable concepts without codable children: Secondary | ICD-10-CM | POA: Diagnosis not present

## 2012-05-18 DIAGNOSIS — I69959 Hemiplegia and hemiparesis following unspecified cerebrovascular disease affecting unspecified side: Secondary | ICD-10-CM | POA: Diagnosis not present

## 2012-05-18 DIAGNOSIS — E119 Type 2 diabetes mellitus without complications: Secondary | ICD-10-CM | POA: Diagnosis not present

## 2012-05-19 ENCOUNTER — Telehealth: Payer: Self-pay | Admitting: Family Medicine

## 2012-05-19 NOTE — Telephone Encounter (Signed)
[place] Fax: [phone] From: [facility] Date/ Time: 05/18/2012 5:42 PM Taken By: Jethro BolusRolm Gala Facility: [facility] Patient: Rica, Heather DOB: [birth year] Phone: [phone] Reason for Call: Physical therapist from [facility] called to get extension on Physical Therapy orders for gait training for ambulation, strengthening and balance. Requesting approval for 2X/week for 4 weeks. Please call back 05/19/12. Regarding Appointment: Appt Date: Appt Time: Unknown Provider: Reason: Details: Confidential

## 2012-05-19 NOTE — Telephone Encounter (Signed)
Ok to extend PT order

## 2012-05-19 NOTE — Telephone Encounter (Signed)
Christina Pittman and spoke with Kay(Clinical Manager) and gave her a order verbally that Dr. Beverely Low said it is ok to extend PT order.//AB/CMA

## 2012-05-20 DIAGNOSIS — I10 Essential (primary) hypertension: Secondary | ICD-10-CM | POA: Diagnosis not present

## 2012-05-20 DIAGNOSIS — I69959 Hemiplegia and hemiparesis following unspecified cerebrovascular disease affecting unspecified side: Secondary | ICD-10-CM | POA: Diagnosis not present

## 2012-05-20 DIAGNOSIS — E119 Type 2 diabetes mellitus without complications: Secondary | ICD-10-CM | POA: Diagnosis not present

## 2012-05-20 DIAGNOSIS — S78119A Complete traumatic amputation at level between unspecified hip and knee, initial encounter: Secondary | ICD-10-CM

## 2012-05-20 DIAGNOSIS — R443 Hallucinations, unspecified: Secondary | ICD-10-CM

## 2012-05-21 DIAGNOSIS — I69959 Hemiplegia and hemiparesis following unspecified cerebrovascular disease affecting unspecified side: Secondary | ICD-10-CM | POA: Diagnosis not present

## 2012-05-21 DIAGNOSIS — S78119A Complete traumatic amputation at level between unspecified hip and knee, initial encounter: Secondary | ICD-10-CM | POA: Diagnosis not present

## 2012-05-21 DIAGNOSIS — I10 Essential (primary) hypertension: Secondary | ICD-10-CM | POA: Diagnosis not present

## 2012-05-21 DIAGNOSIS — IMO0001 Reserved for inherently not codable concepts without codable children: Secondary | ICD-10-CM | POA: Diagnosis not present

## 2012-05-21 DIAGNOSIS — E119 Type 2 diabetes mellitus without complications: Secondary | ICD-10-CM | POA: Diagnosis not present

## 2012-05-22 ENCOUNTER — Other Ambulatory Visit: Payer: Self-pay | Admitting: Family Medicine

## 2012-05-22 DIAGNOSIS — I10 Essential (primary) hypertension: Secondary | ICD-10-CM

## 2012-05-22 NOTE — Telephone Encounter (Signed)
Refill for coreg sent to [facility]

## 2012-05-25 DIAGNOSIS — IMO0001 Reserved for inherently not codable concepts without codable children: Secondary | ICD-10-CM | POA: Diagnosis not present

## 2012-05-25 DIAGNOSIS — I69959 Hemiplegia and hemiparesis following unspecified cerebrovascular disease affecting unspecified side: Secondary | ICD-10-CM | POA: Diagnosis not present

## 2012-05-25 DIAGNOSIS — S78119A Complete traumatic amputation at level between unspecified hip and knee, initial encounter: Secondary | ICD-10-CM | POA: Diagnosis not present

## 2012-05-25 DIAGNOSIS — I10 Essential (primary) hypertension: Secondary | ICD-10-CM | POA: Diagnosis not present

## 2012-05-25 DIAGNOSIS — E119 Type 2 diabetes mellitus without complications: Secondary | ICD-10-CM | POA: Diagnosis not present

## 2012-05-28 ENCOUNTER — Ambulatory Visit (INDEPENDENT_AMBULATORY_CARE_PROVIDER_SITE_OTHER): Payer: Medicare Other | Admitting: Family Medicine

## 2012-05-28 ENCOUNTER — Encounter: Payer: Self-pay | Admitting: Family Medicine

## 2012-05-28 VITALS — BP 100/70 | HR 64 | Temp 98.2°F

## 2012-05-28 DIAGNOSIS — M79609 Pain in unspecified limb: Secondary | ICD-10-CM | POA: Diagnosis not present

## 2012-05-28 DIAGNOSIS — M79675 Pain in left toe(s): Secondary | ICD-10-CM

## 2012-05-28 DIAGNOSIS — IMO0001 Reserved for inherently not codable concepts without codable children: Secondary | ICD-10-CM | POA: Diagnosis not present

## 2012-05-28 DIAGNOSIS — I69959 Hemiplegia and hemiparesis following unspecified cerebrovascular disease affecting unspecified side: Secondary | ICD-10-CM | POA: Diagnosis not present

## 2012-05-28 DIAGNOSIS — S78119A Complete traumatic amputation at level between unspecified hip and knee, initial encounter: Secondary | ICD-10-CM | POA: Diagnosis not present

## 2012-05-28 DIAGNOSIS — M79676 Pain in unspecified toe(s): Secondary | ICD-10-CM | POA: Insufficient documentation

## 2012-05-28 DIAGNOSIS — E119 Type 2 diabetes mellitus without complications: Secondary | ICD-10-CM | POA: Diagnosis not present

## 2012-05-28 DIAGNOSIS — I10 Essential (primary) hypertension: Secondary | ICD-10-CM | POA: Diagnosis not present

## 2012-05-28 LAB — HEMOGLOBIN A1C: Hgb A1c MFr Bld: 6.6 % — ABNORMAL HIGH (ref 4.6–6.5)

## 2012-05-28 LAB — BASIC METABOLIC PANEL
CO2: 25 mEq/L (ref 19–32)
Chloride: 103 mEq/L (ref 96–112)
Sodium: 133 mEq/L — ABNORMAL LOW (ref 135–145)

## 2012-05-28 NOTE — Patient Instructions (Addendum)
Follow up in 3-4 months to recheck sugar We'll notify you of your lab results and make any changes if needed We'll call you with your podiatry appt to get those nails taken care Call with any questions or concerns Happy Early Birthday!

## 2012-05-28 NOTE — Progress Notes (Signed)
  Subjective:    Patient ID: Christina Pittman, female    DOB: [birth year], 78 y.o.   MRN: [id number]  HPI L toe pain- toe got twisted 4 days ago.  Now painful.  Hurts to bend toes.  + swelling, some bruising.  Also has dried blood along medial nail bed on great toe.  L 2nd toe is most painful.  Pt is usually seated in her wheelchair and rarely weight bearing.  DM- chronic problem.  Not checking sugars due to cost of strips.  On Amaryl daily.  Denies symptomatic lows.  Not able to exercise.  Pt is eating low carb diet.  Overdue for labs.   Review of Systems For ROS see HPI     Objective:   Physical Exam  Vitals reviewed. Constitutional: She appears well-developed and well-nourished. No distress.  Cardiovascular: Normal rate, regular rhythm and intact distal pulses.   Murmur (loud blowly murmur heard best at RUSB) heard. Pulmonary/Chest: Effort normal and breath sounds normal. No respiratory distress. She has no wheezes. She has no rales.  Musculoskeletal:  L 2nd toe w/ mild edema and bruising.  No overlying erythema or warmth.  + TTP over PIP joint L great toe w/ dried blood along medial nail bed due to jagged slightly ingrown nail but no evidence of infxn. All toe nails are thickened, some ingrown  Skin: Skin is warm and dry. No erythema.          Assessment & Plan:

## 2012-05-29 ENCOUNTER — Encounter: Payer: Self-pay | Admitting: *Deleted

## 2012-05-31 NOTE — Assessment & Plan Note (Signed)
Chronic problem.  Due for labs.  Pt typically has good control.  Check labs.  Adjust meds prn

## 2012-05-31 NOTE — Assessment & Plan Note (Signed)
New.  Discussed bruise vs fracture.  Since pt is not ambulatory or weight bearing, family elects not to get xray of toe.  Encouraged ice and elevation.  Will refer to podiatry for nail care.  Pt and family in agreement.  Will follow.

## 2012-06-01 DIAGNOSIS — S78119A Complete traumatic amputation at level between unspecified hip and knee, initial encounter: Secondary | ICD-10-CM | POA: Diagnosis not present

## 2012-06-01 DIAGNOSIS — IMO0001 Reserved for inherently not codable concepts without codable children: Secondary | ICD-10-CM | POA: Diagnosis not present

## 2012-06-01 DIAGNOSIS — I10 Essential (primary) hypertension: Secondary | ICD-10-CM | POA: Diagnosis not present

## 2012-06-01 DIAGNOSIS — I69959 Hemiplegia and hemiparesis following unspecified cerebrovascular disease affecting unspecified side: Secondary | ICD-10-CM | POA: Diagnosis not present

## 2012-06-01 DIAGNOSIS — E119 Type 2 diabetes mellitus without complications: Secondary | ICD-10-CM | POA: Diagnosis not present

## 2012-06-03 DIAGNOSIS — I10 Essential (primary) hypertension: Secondary | ICD-10-CM | POA: Diagnosis not present

## 2012-06-03 DIAGNOSIS — E119 Type 2 diabetes mellitus without complications: Secondary | ICD-10-CM | POA: Diagnosis not present

## 2012-06-03 DIAGNOSIS — IMO0001 Reserved for inherently not codable concepts without codable children: Secondary | ICD-10-CM | POA: Diagnosis not present

## 2012-06-03 DIAGNOSIS — I69959 Hemiplegia and hemiparesis following unspecified cerebrovascular disease affecting unspecified side: Secondary | ICD-10-CM | POA: Diagnosis not present

## 2012-06-03 DIAGNOSIS — S78119A Complete traumatic amputation at level between unspecified hip and knee, initial encounter: Secondary | ICD-10-CM | POA: Diagnosis not present

## 2012-06-08 DIAGNOSIS — E119 Type 2 diabetes mellitus without complications: Secondary | ICD-10-CM | POA: Diagnosis not present

## 2012-06-08 DIAGNOSIS — I69959 Hemiplegia and hemiparesis following unspecified cerebrovascular disease affecting unspecified side: Secondary | ICD-10-CM | POA: Diagnosis not present

## 2012-06-08 DIAGNOSIS — I10 Essential (primary) hypertension: Secondary | ICD-10-CM | POA: Diagnosis not present

## 2012-06-08 DIAGNOSIS — S78119A Complete traumatic amputation at level between unspecified hip and knee, initial encounter: Secondary | ICD-10-CM | POA: Diagnosis not present

## 2012-06-08 DIAGNOSIS — IMO0001 Reserved for inherently not codable concepts without codable children: Secondary | ICD-10-CM | POA: Diagnosis not present

## 2012-06-11 DIAGNOSIS — E119 Type 2 diabetes mellitus without complications: Secondary | ICD-10-CM | POA: Diagnosis not present

## 2012-06-11 DIAGNOSIS — S78119A Complete traumatic amputation at level between unspecified hip and knee, initial encounter: Secondary | ICD-10-CM | POA: Diagnosis not present

## 2012-06-11 DIAGNOSIS — I69959 Hemiplegia and hemiparesis following unspecified cerebrovascular disease affecting unspecified side: Secondary | ICD-10-CM | POA: Diagnosis not present

## 2012-06-11 DIAGNOSIS — I10 Essential (primary) hypertension: Secondary | ICD-10-CM | POA: Diagnosis not present

## 2012-06-11 DIAGNOSIS — IMO0001 Reserved for inherently not codable concepts without codable children: Secondary | ICD-10-CM | POA: Diagnosis not present

## 2012-06-15 DIAGNOSIS — B351 Tinea unguium: Secondary | ICD-10-CM | POA: Diagnosis not present

## 2012-06-15 DIAGNOSIS — M79609 Pain in unspecified limb: Secondary | ICD-10-CM | POA: Diagnosis not present

## 2012-07-09 DIAGNOSIS — E1149 Type 2 diabetes mellitus with other diabetic neurological complication: Secondary | ICD-10-CM | POA: Diagnosis not present

## 2012-07-09 DIAGNOSIS — L97109 Non-pressure chronic ulcer of unspecified thigh with unspecified severity: Secondary | ICD-10-CM | POA: Diagnosis not present

## 2012-07-09 DIAGNOSIS — I70269 Atherosclerosis of native arteries of extremities with gangrene, unspecified extremity: Secondary | ICD-10-CM | POA: Diagnosis not present

## 2012-08-18 ENCOUNTER — Other Ambulatory Visit: Payer: Self-pay | Admitting: Family Medicine

## 2012-08-19 ENCOUNTER — Other Ambulatory Visit: Payer: Self-pay | Admitting: *Deleted

## 2012-08-19 DIAGNOSIS — I10 Essential (primary) hypertension: Secondary | ICD-10-CM

## 2012-08-19 MED ORDER — CARVEDILOL 25 MG PO TABS: ORAL_TABLET | ORAL | Status: DC

## 2012-08-19 NOTE — Telephone Encounter (Signed)
Rx was filled for carvedilol 25 mg.

## 2012-08-20 DIAGNOSIS — S81009A Unspecified open wound, unspecified knee, initial encounter: Secondary | ICD-10-CM | POA: Diagnosis not present

## 2012-08-20 DIAGNOSIS — S91009A Unspecified open wound, unspecified ankle, initial encounter: Secondary | ICD-10-CM | POA: Diagnosis not present

## 2012-08-26 ENCOUNTER — Telehealth: Payer: Self-pay | Admitting: *Deleted

## 2012-08-26 NOTE — Telephone Encounter (Signed)
Spoke with the pt's daughter-in-law(Gail) on (08-21-12) regarding the pt falling.  Asked her how the pt was doing and she stated that the pt was doing okay.  The pt fell and skinned her (L) lower leg.  They took her to [facility] and they cleaned it and wrapped it up.  Pt was started on ATB and was referred to [facility].  Verbally informed Dr. Beverely Low of this.//AB/CMA

## 2012-09-02 ENCOUNTER — Other Ambulatory Visit: Payer: Self-pay | Admitting: Family Medicine

## 2012-09-03 NOTE — Telephone Encounter (Signed)
Rx's sent to the pharmacy by e-script.//AB/CMA 

## 2012-09-08 ENCOUNTER — Encounter: Payer: Self-pay | Admitting: Family Medicine

## 2012-09-08 ENCOUNTER — Ambulatory Visit (INDEPENDENT_AMBULATORY_CARE_PROVIDER_SITE_OTHER): Payer: Medicare Other | Admitting: Family Medicine

## 2012-09-08 VITALS — BP 110/80 | HR 69 | Temp 98.4°F

## 2012-09-08 DIAGNOSIS — S51811A Laceration without foreign body of right forearm, initial encounter: Secondary | ICD-10-CM

## 2012-09-08 DIAGNOSIS — I10 Essential (primary) hypertension: Secondary | ICD-10-CM

## 2012-09-08 DIAGNOSIS — N390 Urinary tract infection, site not specified: Secondary | ICD-10-CM

## 2012-09-08 DIAGNOSIS — S51809A Unspecified open wound of unspecified forearm, initial encounter: Secondary | ICD-10-CM

## 2012-09-08 DIAGNOSIS — E119 Type 2 diabetes mellitus without complications: Secondary | ICD-10-CM | POA: Diagnosis not present

## 2012-09-08 DIAGNOSIS — S81009A Unspecified open wound, unspecified knee, initial encounter: Secondary | ICD-10-CM

## 2012-09-08 DIAGNOSIS — E785 Hyperlipidemia, unspecified: Secondary | ICD-10-CM

## 2012-09-08 DIAGNOSIS — S91009A Unspecified open wound, unspecified ankle, initial encounter: Secondary | ICD-10-CM | POA: Diagnosis not present

## 2012-09-08 DIAGNOSIS — S81802A Unspecified open wound, left lower leg, initial encounter: Secondary | ICD-10-CM

## 2012-09-08 LAB — BASIC METABOLIC PANEL
BUN: 19 mg/dL (ref 6–23)
CO2: 23 mEq/L (ref 19–32)
GFR: 47.94 mL/min — ABNORMAL LOW (ref >60.00)
Glucose, Bld: 131 mg/dL — ABNORMAL HIGH (ref 70–99)
Potassium: 5.3 mEq/L — ABNORMAL HIGH (ref 3.5–5.1)
Sodium: 139 mEq/L (ref 135–145)

## 2012-09-08 LAB — CBC WITH DIFFERENTIAL/PLATELET
Basophils Relative: 0.3 % (ref 0.0–3.0)
Eosinophils Relative: 2.5 % (ref 0.0–5.0)
HCT: 37.4 % (ref 36.0–46.0)
Lymphs Abs: 1.9 10*3/uL (ref 0.7–4.0)
MCV: 93.1 fl (ref 78.0–100.0)
Monocytes Absolute: 0.5 10*3/uL (ref 0.1–1.0)
Monocytes Relative: 6.6 % (ref 3.0–12.0)
Neutrophils Relative %: 66.7 % (ref 43.0–77.0)
RBC: 4.02 Mil/uL (ref 3.87–5.11)
WBC: 8 10*3/uL (ref 4.5–10.5)

## 2012-09-08 LAB — HEMOGLOBIN A1C: Hgb A1c MFr Bld: 6.4 % (ref 4.6–6.5)

## 2012-09-08 LAB — LIPID PANEL
Cholesterol: 125 mg/dL (ref 0–200)
VLDL: 25.2 mg/dL (ref 0.0–40.0)

## 2012-09-08 LAB — HEPATIC FUNCTION PANEL
ALT: 15 U/L (ref 0–35)
AST: 25 U/L (ref 0–37)
Albumin: 3.6 g/dL (ref 3.5–5.2)
Alkaline Phosphatase: 55 U/L (ref 39–117)
Total Protein: 6.8 g/dL (ref 6.0–8.3)

## 2012-09-08 NOTE — Assessment & Plan Note (Signed)
Chronic problem.  Pt is not currently on meds but family is not sure why.  Believes pt stopped taking her med when she stopped checking her sugars.  Overdue for eye exam- encouraged them to schedule.  Check labs.  Adjust meds prn

## 2012-09-08 NOTE — Assessment & Plan Note (Signed)
Chronic problem.  Currently well controlled.  Asymptomatic.  Pt was previously on Lisinopril for both BP control and renal protection but this has dropped off pt's med list.  Try and get microalbumin and UA.  Will follow.

## 2012-09-08 NOTE — Assessment & Plan Note (Signed)
New.  No evidence of infxn.  Area cleaned w/ H2O2, prepped w/ abx ointment, steri-stips applied, and dressed w/ telfa pad.  Will order [facility] for wound care.

## 2012-09-08 NOTE — Assessment & Plan Note (Signed)
Chronic problem.  Still taking statin per family report.  Check labs.  Adjust meds prn

## 2012-09-08 NOTE — Progress Notes (Signed)
  Subjective:    Patient ID: Christina Pittman, female    DOB: 30-Oct-1919, 79 y.o.   MRN: [id number]  HPI DM- chronic problem, not currently on any meds.  Refusing to pay for test strips, 'they're $70!'.  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  Overdue on eye exam.  No symptomatic lows.  No CP, SOB, N/V/D.  HTN- chronic problem, well controlled.  On Coreg.  Was previously on Lisinopril but this has fallen off med list somewhere along the way- pt and family are not sure why.  Hyperlipidemia- chronic problem, on Simvastatin.  Denies abd pain, N/V, myalgias  Fall- fell 7/23 and was seen at [facility].  Was supposed to have wound care but they never came to the house.  Has abrasion on L leg.  Bumped R forearm on son's truck and has 2nd skin tear.   Review of Systems For ROS see HPI     Objective:   Physical Exam  Vitals reviewed. Constitutional:  Frail, elderly woman sitting in wheelchair  HENT:  Head: Normocephalic and atraumatic.  Neck: Neck supple. No thyromegaly present.  Cardiovascular: Normal rate, regular rhythm and intact distal pulses.   Murmur (III/VI SEM) heard. Pulmonary/Chest: Effort normal and breath sounds normal. No respiratory distress. She has no wheezes. She has no rales.  Musculoskeletal: She exhibits edema (1+ LLE pitting edema).  Skin: Skin is warm and dry.  5 cm linear skin tear on L lower leg, well approximated by steri-strips w/out evidence of infxn 3-4 cm U shaped skin tear on R forearm w/out evidence of infxn           Assessment & Plan:

## 2012-09-08 NOTE — Assessment & Plan Note (Signed)
Pt w/ recurrent skin tears.  No evidence of infxn.  Wound was cleaned w/ H2O2, abx ointment applied and dressed w/ telfa pad.  HH wound care.

## 2012-09-08 NOTE — Patient Instructions (Addendum)
Follow up in 1 month to recheck skin tears We'll notify you of your lab results and make any changes if needed Home health will contact you to help w/ wound care and medication assistance Call with any questions or concerns Hang in there!!!

## 2012-09-09 ENCOUNTER — Ambulatory Visit: Payer: Medicare Other | Admitting: Family Medicine

## 2012-09-10 DIAGNOSIS — S81809A Unspecified open wound, unspecified lower leg, initial encounter: Secondary | ICD-10-CM | POA: Diagnosis not present

## 2012-09-10 DIAGNOSIS — S78119A Complete traumatic amputation at level between unspecified hip and knee, initial encounter: Secondary | ICD-10-CM | POA: Diagnosis not present

## 2012-09-10 DIAGNOSIS — E119 Type 2 diabetes mellitus without complications: Secondary | ICD-10-CM | POA: Diagnosis not present

## 2012-09-10 DIAGNOSIS — S51809A Unspecified open wound of unspecified forearm, initial encounter: Secondary | ICD-10-CM | POA: Diagnosis not present

## 2012-09-10 DIAGNOSIS — S91009A Unspecified open wound, unspecified ankle, initial encounter: Secondary | ICD-10-CM | POA: Diagnosis not present

## 2012-09-10 DIAGNOSIS — Z9181 History of falling: Secondary | ICD-10-CM | POA: Diagnosis not present

## 2012-09-10 DIAGNOSIS — S81009A Unspecified open wound, unspecified knee, initial encounter: Secondary | ICD-10-CM | POA: Diagnosis not present

## 2012-09-10 DIAGNOSIS — Z4800 Encounter for change or removal of nonsurgical wound dressing: Secondary | ICD-10-CM | POA: Diagnosis not present

## 2012-09-10 DIAGNOSIS — Z9119 Patient's noncompliance with other medical treatment and regimen: Secondary | ICD-10-CM | POA: Diagnosis not present

## 2012-09-11 ENCOUNTER — Telehealth: Payer: Self-pay | Admitting: *Deleted

## 2012-09-11 DIAGNOSIS — D696 Thrombocytopenia, unspecified: Secondary | ICD-10-CM

## 2012-09-11 DIAGNOSIS — E875 Hyperkalemia: Secondary | ICD-10-CM

## 2012-09-11 DIAGNOSIS — N390 Urinary tract infection, site not specified: Secondary | ICD-10-CM | POA: Diagnosis not present

## 2012-09-11 LAB — MICROALBUMIN / CREATININE URINE RATIO
Creatinine,U: 37.3 mg/dL
Microalb Creat Ratio: 2.4 mg/g (ref 0.0–30.0)

## 2012-09-11 LAB — POCT URINALYSIS DIPSTICK
Blood, UA: NEGATIVE
Nitrite, UA: NEGATIVE
Protein, UA: NEGATIVE
Urobilinogen, UA: 0.2
pH, UA: 7

## 2012-09-11 NOTE — Telephone Encounter (Signed)
Message copied by Verdie Shire on Fri Sep 11, 2012  4:43 PM ------      Message from: Sheliah Hatch      Created: Fri Sep 11, 2012 10:04 AM       Labs look good w/ a few exceptions- platelets are much lower than previously.  Needs to repeat CBC next week (dx thrombocytopenia)      K+ is elevated- needs to limit the potassium in her diet (leafy greens, citrus fruits, bananas, etc) and repeat BMP next week (dx hyperkalemia)      A1C and lipids look great! ------

## 2012-09-11 NOTE — Addendum Note (Signed)
Addended by: Silvio Pate D on: 09/11/2012 10:22 AM   Modules accepted: Orders

## 2012-09-11 NOTE — Telephone Encounter (Signed)
Spoke with the pt's granddaughter and informed her of the pt's recent lab results and note.  Granddaughter related the results to the pt and they both understood and agreed.  Pt was scheduled a lab appt and new labs ordered and sent.  Also informed them that the pt's urine results have not come back yet, but when the results do come in we will give her a call with the results.   She agreed.//AB/CMA

## 2012-09-14 ENCOUNTER — Telehealth: Payer: Self-pay | Admitting: Family Medicine

## 2012-09-14 DIAGNOSIS — Z4800 Encounter for change or removal of nonsurgical wound dressing: Secondary | ICD-10-CM | POA: Diagnosis not present

## 2012-09-14 DIAGNOSIS — S51809A Unspecified open wound of unspecified forearm, initial encounter: Secondary | ICD-10-CM | POA: Diagnosis not present

## 2012-09-14 DIAGNOSIS — Z9119 Patient's noncompliance with other medical treatment and regimen: Secondary | ICD-10-CM | POA: Diagnosis not present

## 2012-09-14 DIAGNOSIS — S81009A Unspecified open wound, unspecified knee, initial encounter: Secondary | ICD-10-CM | POA: Diagnosis not present

## 2012-09-14 DIAGNOSIS — S78119A Complete traumatic amputation at level between unspecified hip and knee, initial encounter: Secondary | ICD-10-CM | POA: Diagnosis not present

## 2012-09-14 DIAGNOSIS — E119 Type 2 diabetes mellitus without complications: Secondary | ICD-10-CM | POA: Diagnosis not present

## 2012-09-14 NOTE — Telephone Encounter (Signed)
Pt is supposed to take this as needed for swelling- she has been non-compliant w/ it b/c she doesn't like that it makes her urinate more frequently

## 2012-09-14 NOTE — Telephone Encounter (Signed)
Please advise noted that in pt chart she has not been on Lasix since 06/2011.

## 2012-09-14 NOTE — Telephone Encounter (Signed)
Candee Furbish with [facility] is calling to see if the patient is supposed to be on a diuretic. She says the patient had been on one before. Please advise.

## 2012-09-14 NOTE — Telephone Encounter (Signed)
Called and LMOVM for Darl Pikes.

## 2012-09-15 NOTE — Telephone Encounter (Signed)
Spoke with Darl Pikes and she was advised that pt refuses to take diuretic. Darl Pikes stated that she told pt and family that the diuretic must be taken to reduce the edema in the legs.

## 2012-09-16 ENCOUNTER — Other Ambulatory Visit (INDEPENDENT_AMBULATORY_CARE_PROVIDER_SITE_OTHER): Payer: Medicare Other

## 2012-09-16 DIAGNOSIS — D696 Thrombocytopenia, unspecified: Secondary | ICD-10-CM | POA: Diagnosis not present

## 2012-09-16 DIAGNOSIS — Z4800 Encounter for change or removal of nonsurgical wound dressing: Secondary | ICD-10-CM | POA: Diagnosis not present

## 2012-09-16 DIAGNOSIS — Z9119 Patient's noncompliance with other medical treatment and regimen: Secondary | ICD-10-CM | POA: Diagnosis not present

## 2012-09-16 DIAGNOSIS — S78119A Complete traumatic amputation at level between unspecified hip and knee, initial encounter: Secondary | ICD-10-CM | POA: Diagnosis not present

## 2012-09-16 DIAGNOSIS — E875 Hyperkalemia: Secondary | ICD-10-CM | POA: Diagnosis not present

## 2012-09-16 DIAGNOSIS — S81009A Unspecified open wound, unspecified knee, initial encounter: Secondary | ICD-10-CM | POA: Diagnosis not present

## 2012-09-16 DIAGNOSIS — E119 Type 2 diabetes mellitus without complications: Secondary | ICD-10-CM | POA: Diagnosis not present

## 2012-09-16 DIAGNOSIS — S51809A Unspecified open wound of unspecified forearm, initial encounter: Secondary | ICD-10-CM | POA: Diagnosis not present

## 2012-09-16 LAB — CBC WITH DIFFERENTIAL/PLATELET
Basophils Absolute: 0 10*3/uL (ref 0.0–0.1)
Basophils Relative: 0.6 % (ref 0.0–3.0)
Hemoglobin: 12.1 g/dL (ref 12.0–15.0)
Lymphocytes Relative: 27.3 % (ref 12.0–46.0)
Monocytes Relative: 9.2 % (ref 3.0–12.0)
Neutro Abs: 5 10*3/uL (ref 1.4–7.7)
RBC: 3.89 Mil/uL (ref 3.87–5.11)
RDW: 14.2 % (ref 11.5–14.6)

## 2012-09-16 LAB — BASIC METABOLIC PANEL
CO2: 30 mEq/L (ref 19–32)
Chloride: 105 mEq/L (ref 96–112)
Creatinine, Ser: 1 mg/dL (ref 0.4–1.2)

## 2012-09-17 ENCOUNTER — Telehealth: Payer: Self-pay

## 2012-09-17 ENCOUNTER — Encounter: Payer: Self-pay | Admitting: *Deleted

## 2012-09-17 NOTE — Telephone Encounter (Signed)
Patients daughter called to request results of recent labs:   Platelets are back to normal and K+ is also normal  Patients daughter verbalized understanding of results and was without questions at the time of call

## 2012-09-18 DIAGNOSIS — Z4800 Encounter for change or removal of nonsurgical wound dressing: Secondary | ICD-10-CM | POA: Diagnosis not present

## 2012-09-18 DIAGNOSIS — S78119A Complete traumatic amputation at level between unspecified hip and knee, initial encounter: Secondary | ICD-10-CM | POA: Diagnosis not present

## 2012-09-18 DIAGNOSIS — Z9119 Patient's noncompliance with other medical treatment and regimen: Secondary | ICD-10-CM | POA: Diagnosis not present

## 2012-09-18 DIAGNOSIS — S51809A Unspecified open wound of unspecified forearm, initial encounter: Secondary | ICD-10-CM | POA: Diagnosis not present

## 2012-09-18 DIAGNOSIS — E119 Type 2 diabetes mellitus without complications: Secondary | ICD-10-CM | POA: Diagnosis not present

## 2012-09-18 DIAGNOSIS — S81009A Unspecified open wound, unspecified knee, initial encounter: Secondary | ICD-10-CM | POA: Diagnosis not present

## 2012-09-22 DIAGNOSIS — Z4800 Encounter for change or removal of nonsurgical wound dressing: Secondary | ICD-10-CM | POA: Diagnosis not present

## 2012-09-22 DIAGNOSIS — S51809A Unspecified open wound of unspecified forearm, initial encounter: Secondary | ICD-10-CM | POA: Diagnosis not present

## 2012-09-22 DIAGNOSIS — S78119A Complete traumatic amputation at level between unspecified hip and knee, initial encounter: Secondary | ICD-10-CM | POA: Diagnosis not present

## 2012-09-22 DIAGNOSIS — S81009A Unspecified open wound, unspecified knee, initial encounter: Secondary | ICD-10-CM | POA: Diagnosis not present

## 2012-09-22 DIAGNOSIS — Z9119 Patient's noncompliance with other medical treatment and regimen: Secondary | ICD-10-CM | POA: Diagnosis not present

## 2012-09-22 DIAGNOSIS — E119 Type 2 diabetes mellitus without complications: Secondary | ICD-10-CM | POA: Diagnosis not present

## 2012-09-23 DIAGNOSIS — Z9119 Patient's noncompliance with other medical treatment and regimen: Secondary | ICD-10-CM | POA: Diagnosis not present

## 2012-09-23 DIAGNOSIS — S51809A Unspecified open wound of unspecified forearm, initial encounter: Secondary | ICD-10-CM | POA: Diagnosis not present

## 2012-09-23 DIAGNOSIS — S78119A Complete traumatic amputation at level between unspecified hip and knee, initial encounter: Secondary | ICD-10-CM | POA: Diagnosis not present

## 2012-09-23 DIAGNOSIS — S81009A Unspecified open wound, unspecified knee, initial encounter: Secondary | ICD-10-CM | POA: Diagnosis not present

## 2012-09-23 DIAGNOSIS — Z4800 Encounter for change or removal of nonsurgical wound dressing: Secondary | ICD-10-CM | POA: Diagnosis not present

## 2012-09-23 DIAGNOSIS — E119 Type 2 diabetes mellitus without complications: Secondary | ICD-10-CM | POA: Diagnosis not present

## 2012-09-25 DIAGNOSIS — Z9119 Patient's noncompliance with other medical treatment and regimen: Secondary | ICD-10-CM | POA: Diagnosis not present

## 2012-09-25 DIAGNOSIS — S78119A Complete traumatic amputation at level between unspecified hip and knee, initial encounter: Secondary | ICD-10-CM | POA: Diagnosis not present

## 2012-09-25 DIAGNOSIS — Z4800 Encounter for change or removal of nonsurgical wound dressing: Secondary | ICD-10-CM | POA: Diagnosis not present

## 2012-09-25 DIAGNOSIS — E119 Type 2 diabetes mellitus without complications: Secondary | ICD-10-CM | POA: Diagnosis not present

## 2012-09-25 DIAGNOSIS — S51809A Unspecified open wound of unspecified forearm, initial encounter: Secondary | ICD-10-CM | POA: Diagnosis not present

## 2012-09-25 DIAGNOSIS — S81009A Unspecified open wound, unspecified knee, initial encounter: Secondary | ICD-10-CM | POA: Diagnosis not present

## 2012-10-01 DIAGNOSIS — E119 Type 2 diabetes mellitus without complications: Secondary | ICD-10-CM | POA: Diagnosis not present

## 2012-10-01 DIAGNOSIS — Z9119 Patient's noncompliance with other medical treatment and regimen: Secondary | ICD-10-CM | POA: Diagnosis not present

## 2012-10-01 DIAGNOSIS — S78119A Complete traumatic amputation at level between unspecified hip and knee, initial encounter: Secondary | ICD-10-CM | POA: Diagnosis not present

## 2012-10-01 DIAGNOSIS — S81009A Unspecified open wound, unspecified knee, initial encounter: Secondary | ICD-10-CM | POA: Diagnosis not present

## 2012-10-01 DIAGNOSIS — S51809A Unspecified open wound of unspecified forearm, initial encounter: Secondary | ICD-10-CM | POA: Diagnosis not present

## 2012-10-01 DIAGNOSIS — Z4800 Encounter for change or removal of nonsurgical wound dressing: Secondary | ICD-10-CM | POA: Diagnosis not present

## 2012-10-06 DIAGNOSIS — S78119A Complete traumatic amputation at level between unspecified hip and knee, initial encounter: Secondary | ICD-10-CM | POA: Diagnosis not present

## 2012-10-06 DIAGNOSIS — Z9119 Patient's noncompliance with other medical treatment and regimen: Secondary | ICD-10-CM | POA: Diagnosis not present

## 2012-10-06 DIAGNOSIS — S81009A Unspecified open wound, unspecified knee, initial encounter: Secondary | ICD-10-CM | POA: Diagnosis not present

## 2012-10-06 DIAGNOSIS — S51809A Unspecified open wound of unspecified forearm, initial encounter: Secondary | ICD-10-CM | POA: Diagnosis not present

## 2012-10-06 DIAGNOSIS — E119 Type 2 diabetes mellitus without complications: Secondary | ICD-10-CM | POA: Diagnosis not present

## 2012-10-06 DIAGNOSIS — Z4800 Encounter for change or removal of nonsurgical wound dressing: Secondary | ICD-10-CM | POA: Diagnosis not present

## 2012-10-08 DIAGNOSIS — Z4800 Encounter for change or removal of nonsurgical wound dressing: Secondary | ICD-10-CM | POA: Diagnosis not present

## 2012-10-08 DIAGNOSIS — S81009A Unspecified open wound, unspecified knee, initial encounter: Secondary | ICD-10-CM | POA: Diagnosis not present

## 2012-10-08 DIAGNOSIS — S51809A Unspecified open wound of unspecified forearm, initial encounter: Secondary | ICD-10-CM | POA: Diagnosis not present

## 2012-10-08 DIAGNOSIS — Z9119 Patient's noncompliance with other medical treatment and regimen: Secondary | ICD-10-CM | POA: Diagnosis not present

## 2012-10-08 DIAGNOSIS — S78119A Complete traumatic amputation at level between unspecified hip and knee, initial encounter: Secondary | ICD-10-CM | POA: Diagnosis not present

## 2012-10-08 DIAGNOSIS — E119 Type 2 diabetes mellitus without complications: Secondary | ICD-10-CM | POA: Diagnosis not present

## 2012-10-09 ENCOUNTER — Ambulatory Visit: Payer: Medicare Other | Admitting: Family Medicine

## 2012-10-12 DIAGNOSIS — S51809A Unspecified open wound of unspecified forearm, initial encounter: Secondary | ICD-10-CM | POA: Diagnosis not present

## 2012-10-12 DIAGNOSIS — S81009A Unspecified open wound, unspecified knee, initial encounter: Secondary | ICD-10-CM | POA: Diagnosis not present

## 2012-10-12 DIAGNOSIS — Z4800 Encounter for change or removal of nonsurgical wound dressing: Secondary | ICD-10-CM | POA: Diagnosis not present

## 2012-10-12 DIAGNOSIS — S78119A Complete traumatic amputation at level between unspecified hip and knee, initial encounter: Secondary | ICD-10-CM | POA: Diagnosis not present

## 2012-10-12 DIAGNOSIS — Z9119 Patient's noncompliance with other medical treatment and regimen: Secondary | ICD-10-CM | POA: Diagnosis not present

## 2012-10-12 DIAGNOSIS — E119 Type 2 diabetes mellitus without complications: Secondary | ICD-10-CM | POA: Diagnosis not present

## 2012-10-14 DIAGNOSIS — S51809A Unspecified open wound of unspecified forearm, initial encounter: Secondary | ICD-10-CM | POA: Diagnosis not present

## 2012-10-14 DIAGNOSIS — S78119A Complete traumatic amputation at level between unspecified hip and knee, initial encounter: Secondary | ICD-10-CM | POA: Diagnosis not present

## 2012-10-14 DIAGNOSIS — S81009A Unspecified open wound, unspecified knee, initial encounter: Secondary | ICD-10-CM | POA: Diagnosis not present

## 2012-10-14 DIAGNOSIS — E119 Type 2 diabetes mellitus without complications: Secondary | ICD-10-CM | POA: Diagnosis not present

## 2012-10-14 DIAGNOSIS — Z4800 Encounter for change or removal of nonsurgical wound dressing: Secondary | ICD-10-CM | POA: Diagnosis not present

## 2012-10-14 DIAGNOSIS — Z9119 Patient's noncompliance with other medical treatment and regimen: Secondary | ICD-10-CM | POA: Diagnosis not present

## 2012-10-15 ENCOUNTER — Other Ambulatory Visit: Payer: Self-pay | Admitting: Family Medicine

## 2012-10-16 DIAGNOSIS — E119 Type 2 diabetes mellitus without complications: Secondary | ICD-10-CM | POA: Diagnosis not present

## 2012-10-16 DIAGNOSIS — S78119A Complete traumatic amputation at level between unspecified hip and knee, initial encounter: Secondary | ICD-10-CM | POA: Diagnosis not present

## 2012-10-16 DIAGNOSIS — Z4800 Encounter for change or removal of nonsurgical wound dressing: Secondary | ICD-10-CM | POA: Diagnosis not present

## 2012-10-16 DIAGNOSIS — S51809A Unspecified open wound of unspecified forearm, initial encounter: Secondary | ICD-10-CM | POA: Diagnosis not present

## 2012-10-16 DIAGNOSIS — Z9119 Patient's noncompliance with other medical treatment and regimen: Secondary | ICD-10-CM | POA: Diagnosis not present

## 2012-10-16 DIAGNOSIS — S81009A Unspecified open wound, unspecified knee, initial encounter: Secondary | ICD-10-CM | POA: Diagnosis not present

## 2012-10-16 NOTE — Telephone Encounter (Signed)
Rx filled and sent to [facility]. SW, CMA

## 2012-10-19 DIAGNOSIS — Z9119 Patient's noncompliance with other medical treatment and regimen: Secondary | ICD-10-CM | POA: Diagnosis not present

## 2012-10-19 DIAGNOSIS — S51809A Unspecified open wound of unspecified forearm, initial encounter: Secondary | ICD-10-CM | POA: Diagnosis not present

## 2012-10-19 DIAGNOSIS — S81009A Unspecified open wound, unspecified knee, initial encounter: Secondary | ICD-10-CM | POA: Diagnosis not present

## 2012-10-19 DIAGNOSIS — E119 Type 2 diabetes mellitus without complications: Secondary | ICD-10-CM | POA: Diagnosis not present

## 2012-10-19 DIAGNOSIS — S78119A Complete traumatic amputation at level between unspecified hip and knee, initial encounter: Secondary | ICD-10-CM | POA: Diagnosis not present

## 2012-10-19 DIAGNOSIS — Z4800 Encounter for change or removal of nonsurgical wound dressing: Secondary | ICD-10-CM | POA: Diagnosis not present

## 2012-10-20 DIAGNOSIS — Z9119 Patient's noncompliance with other medical treatment and regimen: Secondary | ICD-10-CM | POA: Diagnosis not present

## 2012-10-20 DIAGNOSIS — S78119A Complete traumatic amputation at level between unspecified hip and knee, initial encounter: Secondary | ICD-10-CM | POA: Diagnosis not present

## 2012-10-20 DIAGNOSIS — Z4800 Encounter for change or removal of nonsurgical wound dressing: Secondary | ICD-10-CM | POA: Diagnosis not present

## 2012-10-20 DIAGNOSIS — S51809A Unspecified open wound of unspecified forearm, initial encounter: Secondary | ICD-10-CM | POA: Diagnosis not present

## 2012-10-20 DIAGNOSIS — E119 Type 2 diabetes mellitus without complications: Secondary | ICD-10-CM | POA: Diagnosis not present

## 2012-10-20 DIAGNOSIS — S81009A Unspecified open wound, unspecified knee, initial encounter: Secondary | ICD-10-CM | POA: Diagnosis not present

## 2012-10-21 DIAGNOSIS — S51809A Unspecified open wound of unspecified forearm, initial encounter: Secondary | ICD-10-CM | POA: Diagnosis not present

## 2012-10-21 DIAGNOSIS — Z9119 Patient's noncompliance with other medical treatment and regimen: Secondary | ICD-10-CM | POA: Diagnosis not present

## 2012-10-21 DIAGNOSIS — S81009A Unspecified open wound, unspecified knee, initial encounter: Secondary | ICD-10-CM | POA: Diagnosis not present

## 2012-10-21 DIAGNOSIS — E119 Type 2 diabetes mellitus without complications: Secondary | ICD-10-CM | POA: Diagnosis not present

## 2012-10-21 DIAGNOSIS — Z4800 Encounter for change or removal of nonsurgical wound dressing: Secondary | ICD-10-CM | POA: Diagnosis not present

## 2012-10-21 DIAGNOSIS — S78119A Complete traumatic amputation at level between unspecified hip and knee, initial encounter: Secondary | ICD-10-CM | POA: Diagnosis not present

## 2012-10-23 DIAGNOSIS — E119 Type 2 diabetes mellitus without complications: Secondary | ICD-10-CM | POA: Diagnosis not present

## 2012-10-23 DIAGNOSIS — Z4800 Encounter for change or removal of nonsurgical wound dressing: Secondary | ICD-10-CM | POA: Diagnosis not present

## 2012-10-23 DIAGNOSIS — S78119A Complete traumatic amputation at level between unspecified hip and knee, initial encounter: Secondary | ICD-10-CM | POA: Diagnosis not present

## 2012-10-23 DIAGNOSIS — Z9119 Patient's noncompliance with other medical treatment and regimen: Secondary | ICD-10-CM | POA: Diagnosis not present

## 2012-10-23 DIAGNOSIS — S81009A Unspecified open wound, unspecified knee, initial encounter: Secondary | ICD-10-CM | POA: Diagnosis not present

## 2012-10-23 DIAGNOSIS — S51809A Unspecified open wound of unspecified forearm, initial encounter: Secondary | ICD-10-CM | POA: Diagnosis not present

## 2012-10-26 DIAGNOSIS — Z9119 Patient's noncompliance with other medical treatment and regimen: Secondary | ICD-10-CM | POA: Diagnosis not present

## 2012-10-26 DIAGNOSIS — Z4800 Encounter for change or removal of nonsurgical wound dressing: Secondary | ICD-10-CM | POA: Diagnosis not present

## 2012-10-26 DIAGNOSIS — S78119A Complete traumatic amputation at level between unspecified hip and knee, initial encounter: Secondary | ICD-10-CM | POA: Diagnosis not present

## 2012-10-26 DIAGNOSIS — S81009A Unspecified open wound, unspecified knee, initial encounter: Secondary | ICD-10-CM | POA: Diagnosis not present

## 2012-10-26 DIAGNOSIS — E119 Type 2 diabetes mellitus without complications: Secondary | ICD-10-CM | POA: Diagnosis not present

## 2012-10-26 DIAGNOSIS — S51809A Unspecified open wound of unspecified forearm, initial encounter: Secondary | ICD-10-CM | POA: Diagnosis not present

## 2012-10-28 DIAGNOSIS — E119 Type 2 diabetes mellitus without complications: Secondary | ICD-10-CM | POA: Diagnosis not present

## 2012-10-28 DIAGNOSIS — Z9119 Patient's noncompliance with other medical treatment and regimen: Secondary | ICD-10-CM | POA: Diagnosis not present

## 2012-10-28 DIAGNOSIS — S78119A Complete traumatic amputation at level between unspecified hip and knee, initial encounter: Secondary | ICD-10-CM | POA: Diagnosis not present

## 2012-10-28 DIAGNOSIS — Z4800 Encounter for change or removal of nonsurgical wound dressing: Secondary | ICD-10-CM | POA: Diagnosis not present

## 2012-10-28 DIAGNOSIS — S51809A Unspecified open wound of unspecified forearm, initial encounter: Secondary | ICD-10-CM | POA: Diagnosis not present

## 2012-10-28 DIAGNOSIS — S81009A Unspecified open wound, unspecified knee, initial encounter: Secondary | ICD-10-CM | POA: Diagnosis not present

## 2012-10-30 ENCOUNTER — Telehealth: Payer: Self-pay | Admitting: Family Medicine

## 2012-10-30 DIAGNOSIS — E119 Type 2 diabetes mellitus without complications: Secondary | ICD-10-CM | POA: Diagnosis not present

## 2012-10-30 DIAGNOSIS — Z9119 Patient's noncompliance with other medical treatment and regimen: Secondary | ICD-10-CM | POA: Diagnosis not present

## 2012-10-30 DIAGNOSIS — S51809A Unspecified open wound of unspecified forearm, initial encounter: Secondary | ICD-10-CM | POA: Diagnosis not present

## 2012-10-30 DIAGNOSIS — S78119A Complete traumatic amputation at level between unspecified hip and knee, initial encounter: Secondary | ICD-10-CM | POA: Diagnosis not present

## 2012-10-30 DIAGNOSIS — Z4800 Encounter for change or removal of nonsurgical wound dressing: Secondary | ICD-10-CM | POA: Diagnosis not present

## 2012-10-30 DIAGNOSIS — S81009A Unspecified open wound, unspecified knee, initial encounter: Secondary | ICD-10-CM | POA: Diagnosis not present

## 2012-10-30 NOTE — Telephone Encounter (Signed)
Pt should be taking med every day to make sure BP is normal. OK to extend PT

## 2012-10-30 NOTE — Telephone Encounter (Signed)
Shon Hale with [facility] is calling to let Dr. Beverely Low know that the patient is out of Physical Therapy visits and she is recommending 2 more Physical Therapy visits for next week because the patient is complaining of pain and tenderness to touch in her RT leg.  She needs a verbal for these orders.   She also wants for Dr. Beverely Low to know that the patient is now taking her fluid pills every other day instead of every day. Since she has been taking pills less Shon Hale states that she has noticed an increase in the patient's blood pressure.

## 2012-10-30 NOTE — Telephone Encounter (Signed)
Called and LMOVM to inform [facility]

## 2012-11-01 ENCOUNTER — Emergency Department (HOSPITAL_BASED_OUTPATIENT_CLINIC_OR_DEPARTMENT_OTHER)
Admission: EM | Admit: 2012-11-01 | Discharge: 2012-11-02 | Disposition: A | Discharge status: Home / Self Care | Payer: Medicare Other | Attending: Emergency Medicine | Admitting: Emergency Medicine

## 2012-11-01 ENCOUNTER — Encounter (HOSPITAL_BASED_OUTPATIENT_CLINIC_OR_DEPARTMENT_OTHER): Payer: Self-pay | Admitting: *Deleted

## 2012-11-01 DIAGNOSIS — E785 Hyperlipidemia, unspecified: Secondary | ICD-10-CM | POA: Diagnosis not present

## 2012-11-01 DIAGNOSIS — E119 Type 2 diabetes mellitus without complications: Secondary | ICD-10-CM | POA: Diagnosis not present

## 2012-11-01 DIAGNOSIS — Z8669 Personal history of other diseases of the nervous system and sense organs: Secondary | ICD-10-CM | POA: Diagnosis not present

## 2012-11-01 DIAGNOSIS — R21 Rash and other nonspecific skin eruption: Secondary | ICD-10-CM

## 2012-11-01 DIAGNOSIS — I10 Essential (primary) hypertension: Secondary | ICD-10-CM | POA: Insufficient documentation

## 2012-11-01 DIAGNOSIS — Z8701 Personal history of pneumonia (recurrent): Secondary | ICD-10-CM | POA: Insufficient documentation

## 2012-11-01 DIAGNOSIS — Z8673 Personal history of transient ischemic attack (TIA), and cerebral infarction without residual deficits: Secondary | ICD-10-CM | POA: Diagnosis not present

## 2012-11-01 DIAGNOSIS — Z79899 Other long term (current) drug therapy: Secondary | ICD-10-CM | POA: Insufficient documentation

## 2012-11-01 DIAGNOSIS — M129 Arthropathy, unspecified: Secondary | ICD-10-CM | POA: Insufficient documentation

## 2012-11-01 DIAGNOSIS — Z8781 Personal history of (healed) traumatic fracture: Secondary | ICD-10-CM | POA: Diagnosis not present

## 2012-11-01 DIAGNOSIS — W269XXA Contact with unspecified sharp object(s), initial encounter: Secondary | ICD-10-CM | POA: Insufficient documentation

## 2012-11-01 DIAGNOSIS — Z8619 Personal history of other infectious and parasitic diseases: Secondary | ICD-10-CM | POA: Insufficient documentation

## 2012-11-01 DIAGNOSIS — L538 Other specified erythematous conditions: Secondary | ICD-10-CM | POA: Diagnosis not present

## 2012-11-01 DIAGNOSIS — R011 Cardiac murmur, unspecified: Secondary | ICD-10-CM | POA: Diagnosis not present

## 2012-11-01 DIAGNOSIS — Z7902 Long term (current) use of antithrombotics/antiplatelets: Secondary | ICD-10-CM | POA: Diagnosis not present

## 2012-11-01 DIAGNOSIS — Y929 Unspecified place or not applicable: Secondary | ICD-10-CM | POA: Insufficient documentation

## 2012-11-01 DIAGNOSIS — T8140XA Infection following a procedure, unspecified, initial encounter: Secondary | ICD-10-CM | POA: Diagnosis not present

## 2012-11-01 DIAGNOSIS — Y939 Activity, unspecified: Secondary | ICD-10-CM | POA: Insufficient documentation

## 2012-11-01 MED ORDER — DEXTROSE 5 % IV SOLN: 1.0000 g | Freq: Once | INTRAVENOUS | Status: DC

## 2012-11-01 MED ORDER — VANCOMYCIN HCL IN DEXTROSE 1-5 GM/200ML-% IV SOLN
1000.0000 mg | Freq: Once | INTRAVENOUS | Status: AC
  Administered 2012-11-02: 1000 mg via INTRAVENOUS
  Filled 2012-11-01: qty 200

## 2012-11-01 NOTE — ED Provider Notes (Signed)
CSN: [id number]     Arrival date & time 11/01/12  2223 History   First MD Initiated Contact with Patient 11/01/12 2245     Chief Complaint  Patient presents with  . Wound Infection   (Consider location/radiation/quality/duration/timing/severity/associated sxs/prior Treatment) Patient is a 79 y.o. female presenting with arm injury. The history is provided by the patient. No language interpreter was used.  Arm Injury Location:  Arm Time since incident:  2 days Arm location:  R arm Pain details:    Quality:  Aching   Radiates to:  R arm   Severity:  Mild   Onset quality:  Gradual   Timing:  Constant   Progression:  Worsening Chronicity:  New Handedness:  Right-handed Dislocation: no   Foreign body present:  No foreign bodies Tetanus status:  Out of date Prior injury to area:  Yes Relieved by:  Nothing Worsened by:  Nothing tried Ineffective treatments:  None tried Pt complains of redness in her right arm.  Pt has a small cut on elbow from injury   Past Medical History  Diagnosis Date  . Arthritis   . Diabetes mellitus   . Hypertension   . Heart murmur   . Chicken pox   . Fractured pelvis 05/14/2011  . Stroke     feb 2013  . Pituitary macroadenoma   . Insomnia   . Hyperlipemia   . Hemiparesis, right   . Dysphagia   . Bilateral venous insufficiency   . Aspiration pneumonia   . Aneurysm of thoracic aorta   . Gangrene of foot    Past Surgical History  Procedure Laterality Date  . Abdominal hysterectomy    . Above knee leg amputation     Family History  Problem Relation Age of Onset  . Arthritis Mother   . Diabetes Sister   . Heart disease Brother   . Diabetes Brother    History  Substance Use Topics  . Smoking status: Never Smoker   . Smokeless tobacco: Not on file  . Alcohol Use: No   OB History   Grav Para Term Preterm Abortions TAB SAB Ect Mult Living                 Review of Systems  Skin: Positive for wound.  All other systems reviewed and are  negative.    Allergies  Betadine; Codeine; Equalyte; and Neosporin  Home Medications   Current Outpatient Rx  Name  Route  Sig  Dispense  Refill  . alendronate (FOSAMAX) 70 MG tablet   Oral   Take 1 tablet (70 mg total) by mouth every 7 (seven) days. Take with a full glass of water on an empty stomach.  Taken on Wednesdays.   4 tablet   3   . carvedilol (COREG) 25 MG tablet      TAKE ONE TABLET BY MOUTH TWICE DAILY WITH A MEAL   60 tablet   2   . cephALEXin (KEFLEX) 500 MG capsule   Oral   Take 1 capsule (500 mg total) by mouth 2 (two) times daily. Pt to take the ATB times 5 days.   10 capsule   0   . clopidogrel (PLAVIX) 75 MG tablet      TAKE ONE TABLET BY MOUTH EVERY DAY WITH BREAKFAST   30 tablet   5   . furosemide (LASIX) 20 MG tablet      TAKE ONE TABLET BY MOUTH EVERY DAY AS NEEDED FOR SWELLING   30  tablet   3   . mirtazapine (REMERON) 15 MG tablet   Oral   Take 1 tablet (15 mg total) by mouth at bedtime.   90 tablet   0   . simvastatin (ZOCOR) 20 MG tablet      TAKE ONE TABLET BY MOUTH EVERY DAY AT  6PM   30 tablet   5   . vitamin D, CHOLECALCIFEROL, 400 UNITS tablet   Oral   Take 400 Units by mouth 2 (two) times daily.          BP 156/74  Pulse 74  Temp(Src) 98.2 F (36.8 C) (Oral)  Resp 16  Ht 5\' 2"  (1.575 m)  Wt 130 lb (58.968 kg)  BMI 23.77 kg/m2  SpO2 99% Physical Exam  Constitutional: She is oriented to person, place, and time. She appears well-developed and well-nourished.  HENT:  Head: Normocephalic.  Right Ear: External ear normal.  Left Ear: External ear normal.  Eyes: Pupils are equal, round, and reactive to light.  Neck: Normal range of motion.  Cardiovascular: Normal rate.   Pulmonary/Chest: Effort normal.  Musculoskeletal: She exhibits tenderness.  1cm laceration healing right elbow,  Erythema to hand up to mid arm.    Neurological: She is alert and oriented to person, place, and time.  Skin: Skin is warm.   Psychiatric: She has a normal mood and affect.    ED Course  Procedures (including critical care time) Labs Review Labs Reviewed  CBC WITH DIFFERENTIAL  COMPREHENSIVE METABOLIC PANEL  URINALYSIS, ROUTINE W REFLEX MICROSCOPIC   Imaging Review No results found.  MDM   1. Erythematous rash    Pt given Iv antibiotics,  Dr. Ranae Palms in to see.  Pt's care turned over to Dr. Ranae Palms  With labs pending.    Lonia Skinner Burnside, PA-C 11/02/12 [phone]

## 2012-11-01 NOTE — ED Notes (Signed)
Pt c/o right arm pain x 2 day with redness x 1 day

## 2012-11-02 ENCOUNTER — Ambulatory Visit (INDEPENDENT_AMBULATORY_CARE_PROVIDER_SITE_OTHER): Payer: Medicare Other | Admitting: Internal Medicine

## 2012-11-02 ENCOUNTER — Encounter: Payer: Self-pay | Admitting: Internal Medicine

## 2012-11-02 VITALS — BP 173/71 | HR 66 | Temp 98.3°F

## 2012-11-02 DIAGNOSIS — R21 Rash and other nonspecific skin eruption: Secondary | ICD-10-CM | POA: Diagnosis not present

## 2012-11-02 LAB — CBC WITH DIFFERENTIAL/PLATELET
Basophils Absolute: 0 10*3/uL (ref 0.0–0.1)
Basophils Relative: 0 % (ref 0–1)
Eosinophils Absolute: 0.2 10*3/uL (ref 0.0–0.7)
Eosinophils Relative: 3 % (ref 0–5)
MCH: 31.3 pg (ref 26.0–34.0)
MCHC: 33.8 g/dL (ref 30.0–36.0)
MCV: 92.5 fL (ref 78.0–100.0)
Monocytes Absolute: 1.5 10*3/uL — ABNORMAL HIGH (ref 0.1–1.0)
Platelets: 161 10*3/uL (ref 150–400)
RDW: 13.1 % (ref 11.5–15.5)
WBC: 8.1 10*3/uL (ref 4.0–10.5)

## 2012-11-02 LAB — COMPREHENSIVE METABOLIC PANEL
ALT: 16 U/L (ref 0–35)
AST: 25 U/L (ref 0–37)
Calcium: 9.2 mg/dL (ref 8.4–10.5)
Creatinine, Ser: 1 mg/dL (ref 0.50–1.10)
GFR calc Af Amer: 55 mL/min — ABNORMAL LOW (ref >90)
Sodium: 137 mEq/L (ref 135–145)
Total Protein: 6.5 g/dL (ref 6.0–8.3)

## 2012-11-02 MED ORDER — CEFTRIAXONE SODIUM 1 G IJ SOLR
INTRAMUSCULAR | Status: AC
  Filled 2012-11-02: qty 10

## 2012-11-02 MED ORDER — CLINDAMYCIN HCL 300 MG PO CAPS: 300.0000 mg | ORAL_CAPSULE | Freq: Four times a day (QID) | ORAL | Status: DC

## 2012-11-02 NOTE — ED Provider Notes (Signed)
Patient with one day of arm redness. She's had no fevers or chills. She's had no fatigue or malaise. Per family she has been in her normal state of health. She struck her elbow 2 days ago and sustained a small laceration to her elbow. Patient's vital signs are normal including a heart rate in the 70s and a normal temperature. She is in no distress and is very well-appearing. She has erythema is sharply demarcated up to the mid humerus. She has no swelling or tenderness of the arm. She complains of itching to the arm. She has good distal pulses. She has good range of motion and all of her joints. Patient's laboratory testing are within normal limits including a normal white blood cell count. She and her family are anxious to be discharged home. I think this is unlikely to be cellulitis given how well the patient appears, her normal vital signs and a normal white blood cell count. This likely is some type of dermatitis. However, given her age and comorbidities she has been given IV antibiotics and will be discharged home with oral antibiotics and close followup. Patient and family have been instructed to return to the emergency department tomorrow to be re-evaluated  or to see her primary doctor tomorrow. Patient has been given strict precautions to return immediately for fever, worsening redness, pain, or for any concerns. Both the family and the patient voiced understanding of these instructions.  Loren Racer, MD 11/02/12 [phone]

## 2012-11-02 NOTE — ED Notes (Signed)
Arm pain and reddness with moderate pain 5/10 denies injury skin intact.site marked with skin marker to assess baseline changes

## 2012-11-02 NOTE — ED Notes (Signed)
Right arm pain onset x 2 days sore one day ago now redden and painful

## 2012-11-02 NOTE — Patient Instructions (Addendum)
Please take the probiotic , Align, every day until the antibiotic is completed. This will replace the normal bacteria which  are necessary for formation of normal stool and processing of food. Because of a history of documented hives with Neosporin & Betadine ,significant adverse serious drug reactions;Medi Alert bracelet  is recommended

## 2012-11-02 NOTE — ED Provider Notes (Signed)
Medical screening examination/treatment/procedure(s) were conducted as a shared visit with non-physician practitioner(s) and myself.  I personally evaluated the patient during the encounter   Christina Racer, MD 11/02/12 2324

## 2012-11-02 NOTE — Progress Notes (Signed)
  Subjective:    Patient ID: Christina Pittman, female    DOB: [birth year], 79 y.o.   MRN: [id number]  HPI   11/02/12 emergency room notes were reviewed; she was prescribed clindamycin which has not been started yet.  She has had low-grade fevers.  The erythematous changes of the right upper extremity have not changed based on delineation with a marker. Edema of the right hand has resolved.    Review of Systems  WBC was normal in the emergency room. She had a mild anemia. Glucose, nonfasting was 148. Her A1c was 6.4% in July. She does have a history of hives with Betadine and Neosporin. The reaction is stated for  [facility]; I was unable to identify this product using 2 references.     Objective:   Physical Exam Gen.: Adequately nourished in appearance. Appropriate and cooperative throughout exam.Appears younger than stated age   Eyes: No corneal or conjunctival inflammation noted. No icterus Mouth: Oral mucosa and oropharynx reveal no lesions or exudates. edentuous. Neck: No deformities, masses, or tenderness noted.  Lungs: Normal respiratory effort; chest expands symmetrically.Bibasialr dry rales w/o  wheezes or increased work of breathing. Heart: Normal rate and rhythm. Normal S1 and S2. No gallop, click, or rub. Grade 1/6 systolic murmur.                                 Musculoskeletal/extremities: BKA RLE. Tense edema LLE. Vitiliginous scarring LLE Vascular:  radial artery pulses are full and equal.  Neurologic: In wheelchair.        Skin: post traumatic hyperpigmentation of the forearms, right greater than left. Healing laceration right elbow. Slightly warm erythematous changes of the right mid upper arm. Erythema does not blanch. Lymph: No cervical, axillary, or epitrochlear lymphadenopathy present. Psych: Mood and affect aresudued                                                                                        Assessment & Plan:  #1 erythematous changes right upper  extremity in the context of a laceration of the right elbow.  Plan: She will take the clindamycin prescribed by the ER MD. It is important to monitor the extent of the erythema. It should resolve with the antibiotics orally. Probiotics are recommended should she have any diarrhea.

## 2012-11-03 DIAGNOSIS — E119 Type 2 diabetes mellitus without complications: Secondary | ICD-10-CM | POA: Diagnosis not present

## 2012-11-03 DIAGNOSIS — S51809A Unspecified open wound of unspecified forearm, initial encounter: Secondary | ICD-10-CM | POA: Diagnosis not present

## 2012-11-03 DIAGNOSIS — Z4800 Encounter for change or removal of nonsurgical wound dressing: Secondary | ICD-10-CM | POA: Diagnosis not present

## 2012-11-03 DIAGNOSIS — Z9119 Patient's noncompliance with other medical treatment and regimen: Secondary | ICD-10-CM | POA: Diagnosis not present

## 2012-11-03 DIAGNOSIS — S81009A Unspecified open wound, unspecified knee, initial encounter: Secondary | ICD-10-CM | POA: Diagnosis not present

## 2012-11-03 DIAGNOSIS — S78119A Complete traumatic amputation at level between unspecified hip and knee, initial encounter: Secondary | ICD-10-CM | POA: Diagnosis not present

## 2012-11-04 DIAGNOSIS — S81009A Unspecified open wound, unspecified knee, initial encounter: Secondary | ICD-10-CM | POA: Diagnosis not present

## 2012-11-04 DIAGNOSIS — S51809A Unspecified open wound of unspecified forearm, initial encounter: Secondary | ICD-10-CM | POA: Diagnosis not present

## 2012-11-04 DIAGNOSIS — Z9119 Patient's noncompliance with other medical treatment and regimen: Secondary | ICD-10-CM | POA: Diagnosis not present

## 2012-11-04 DIAGNOSIS — S78119A Complete traumatic amputation at level between unspecified hip and knee, initial encounter: Secondary | ICD-10-CM | POA: Diagnosis not present

## 2012-11-04 DIAGNOSIS — E119 Type 2 diabetes mellitus without complications: Secondary | ICD-10-CM | POA: Diagnosis not present

## 2012-11-04 DIAGNOSIS — Z4800 Encounter for change or removal of nonsurgical wound dressing: Secondary | ICD-10-CM | POA: Diagnosis not present

## 2012-11-05 DIAGNOSIS — E119 Type 2 diabetes mellitus without complications: Secondary | ICD-10-CM | POA: Diagnosis not present

## 2012-11-05 DIAGNOSIS — S78119A Complete traumatic amputation at level between unspecified hip and knee, initial encounter: Secondary | ICD-10-CM | POA: Diagnosis not present

## 2012-11-05 DIAGNOSIS — S81009A Unspecified open wound, unspecified knee, initial encounter: Secondary | ICD-10-CM | POA: Diagnosis not present

## 2012-11-05 DIAGNOSIS — Z4800 Encounter for change or removal of nonsurgical wound dressing: Secondary | ICD-10-CM | POA: Diagnosis not present

## 2012-11-05 DIAGNOSIS — S51809A Unspecified open wound of unspecified forearm, initial encounter: Secondary | ICD-10-CM | POA: Diagnosis not present

## 2012-11-05 DIAGNOSIS — Z9119 Patient's noncompliance with other medical treatment and regimen: Secondary | ICD-10-CM | POA: Diagnosis not present

## 2012-11-16 ENCOUNTER — Other Ambulatory Visit: Payer: Self-pay | Admitting: Family Medicine

## 2012-11-17 NOTE — Telephone Encounter (Signed)
Med filled.  

## 2012-11-20 ENCOUNTER — Ambulatory Visit: Payer: Medicare Other | Admitting: Internal Medicine

## 2012-12-03 ENCOUNTER — Ambulatory Visit (INDEPENDENT_AMBULATORY_CARE_PROVIDER_SITE_OTHER): Payer: Medicare Other | Admitting: Family Medicine

## 2012-12-03 ENCOUNTER — Encounter: Payer: Self-pay | Admitting: Family Medicine

## 2012-12-03 VITALS — BP 120/78 | HR 44 | Temp 98.1°F | Resp 17

## 2012-12-03 DIAGNOSIS — G8190 Hemiplegia, unspecified affecting unspecified side: Secondary | ICD-10-CM

## 2012-12-03 DIAGNOSIS — S78119A Complete traumatic amputation at level between unspecified hip and knee, initial encounter: Secondary | ICD-10-CM

## 2012-12-03 DIAGNOSIS — Z89611 Acquired absence of right leg above knee: Secondary | ICD-10-CM

## 2012-12-03 DIAGNOSIS — Z89619 Acquired absence of unspecified leg above knee: Secondary | ICD-10-CM | POA: Insufficient documentation

## 2012-12-03 DIAGNOSIS — I6350 Cerebral infarction due to unspecified occlusion or stenosis of unspecified cerebral artery: Secondary | ICD-10-CM | POA: Diagnosis not present

## 2012-12-03 DIAGNOSIS — R69 Illness, unspecified: Secondary | ICD-10-CM

## 2012-12-03 DIAGNOSIS — G8191 Hemiplegia, unspecified affecting right dominant side: Secondary | ICD-10-CM

## 2012-12-03 DIAGNOSIS — Z7409 Other reduced mobility: Secondary | ICD-10-CM

## 2012-12-03 DIAGNOSIS — I639 Cerebral infarction, unspecified: Secondary | ICD-10-CM

## 2012-12-03 NOTE — Assessment & Plan Note (Signed)
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  Unable to wear leg prosthesis due to weight of fake limb.

## 2012-12-03 NOTE — Patient Instructions (Signed)
Follow up in 1 month to recheck diabetes We'll submit all the forms for you and, fingers crossed, you'll get your chair! Call with any questions or concerns Happy Thanksgiving!

## 2012-12-03 NOTE — Progress Notes (Signed)
  Subjective:    Patient ID: Christina Pittman, female    DOB: [birth year], 79 y.o.   MRN: [id number]  HPI Mobility evaluation- pt is interested in getting a power wheelchair so she is not 'asking somebody all the time'.  Pt has a R AKA and is s/p CVA w/ R sided residual weakness but not paralysis.  Pt is able to shift body weight independently.  Has not been able to successfully ambulate w/ prosthetic leg due to heaviness.  Due to CVA, pt has UE weakness that would limit ability to propel manual wheelchair.  Pt is unable to use walker or other assistive device (cane, crutches) due to absence of R leg.  Pt's current home environment is not able to support a power scooter due to size restrictions.  Pt requires a motorized wheelchair in order to travel from room to room, particularly the bathroom.  Pt at this time, is physically and mentally able to operate a motorized wheelchair and promises to use this regularly on a daily basis in the home environment.   Review of Systems For ROS see HPI     Objective:   Physical Exam  Vitals reviewed. Constitutional: She appears well-developed and well-nourished. No distress.  Cardiovascular: Intact distal pulses.   Musculoskeletal: She exhibits edema. Tenderness: of LLE, 1+ pitting.  RUE 3/5, LUE 4/5 RLE- absent, LLE 4/5  Skin: Skin is warm and dry.          Assessment & Plan:

## 2012-12-03 NOTE — Assessment & Plan Note (Signed)
R UE weakness, not paralysis.  Able to propel motorized wheelchair but not scooter.

## 2012-12-03 NOTE — Assessment & Plan Note (Signed)
Pt relies on family to push her in a manual chair.  Pt is severely limited due to inability to walk, use cane/walker/braces, or propel manual chair.  Seeking motorized wheel chair.  Very excited about possibility of regaining some independence.

## 2013-01-25 ENCOUNTER — Telehealth: Payer: Self-pay | Admitting: Family Medicine

## 2013-01-25 NOTE — Telephone Encounter (Signed)
Patient's daughter [name] states the patient was exposed to flu and has been prescribed Tamiflu to take preventatively. She would like to know if this will interact with any of the medications she is currently taking. Please advise.

## 2013-01-25 NOTE — Telephone Encounter (Signed)
tamiflu should not interact w/ anything she is taking

## 2013-01-25 NOTE — Telephone Encounter (Signed)
Pt daughter advised.

## 2013-02-14 ENCOUNTER — Other Ambulatory Visit: Payer: Self-pay | Admitting: Family Medicine

## 2013-03-01 ENCOUNTER — Other Ambulatory Visit: Payer: Self-pay | Admitting: Family Medicine

## 2013-03-02 NOTE — Telephone Encounter (Signed)
Med filled.  

## 2013-03-27 DIAGNOSIS — S91109A Unspecified open wound of unspecified toe(s) without damage to nail, initial encounter: Secondary | ICD-10-CM | POA: Diagnosis not present

## 2013-03-30 ENCOUNTER — Telehealth: Payer: Self-pay

## 2013-03-30 NOTE — Telephone Encounter (Signed)
Information provided by daughter, Nestor Lewandowsky, and patient  Medication and allergies:  Reviewed and updated  90 day supply/mail order: n/a Local pharmacy:  [facility], [place].   Immunizations due: ?   A/P: No changes to personal, family history or past surgical hx PAP- patient no longer wants to receive CCS- patient refuses to have procedure done MMG- patient no longer wants so receive BD-patient no longer wants to receive Daugher/Patient were unsure about vaccines.    To Discuss with Provider: Not at this time.

## 2013-04-01 ENCOUNTER — Ambulatory Visit (INDEPENDENT_AMBULATORY_CARE_PROVIDER_SITE_OTHER): Payer: Medicare Other | Admitting: Family Medicine

## 2013-04-01 ENCOUNTER — Encounter: Payer: Self-pay | Admitting: General Practice

## 2013-04-01 ENCOUNTER — Encounter: Payer: Self-pay | Admitting: Family Medicine

## 2013-04-01 VITALS — BP 136/84 | HR 52 | Temp 97.3°F | Resp 16

## 2013-04-01 DIAGNOSIS — I639 Cerebral infarction, unspecified: Secondary | ICD-10-CM

## 2013-04-01 DIAGNOSIS — Z89619 Acquired absence of unspecified leg above knee: Secondary | ICD-10-CM

## 2013-04-01 DIAGNOSIS — I6350 Cerebral infarction due to unspecified occlusion or stenosis of unspecified cerebral artery: Secondary | ICD-10-CM | POA: Diagnosis not present

## 2013-04-01 DIAGNOSIS — S78119A Complete traumatic amputation at level between unspecified hip and knee, initial encounter: Secondary | ICD-10-CM | POA: Diagnosis not present

## 2013-04-01 DIAGNOSIS — I10 Essential (primary) hypertension: Secondary | ICD-10-CM | POA: Diagnosis not present

## 2013-04-01 DIAGNOSIS — Z0000 Encounter for general adult medical examination without abnormal findings: Secondary | ICD-10-CM | POA: Diagnosis not present

## 2013-04-01 DIAGNOSIS — E785 Hyperlipidemia, unspecified: Secondary | ICD-10-CM | POA: Diagnosis not present

## 2013-04-01 DIAGNOSIS — E119 Type 2 diabetes mellitus without complications: Secondary | ICD-10-CM

## 2013-04-01 LAB — BASIC METABOLIC PANEL
BUN: 19 mg/dL (ref 6–23)
CHLORIDE: 108 meq/L (ref 96–112)
CO2: 23 mEq/L (ref 19–32)
CREATININE: 1 mg/dL (ref 0.4–1.2)
Calcium: 9.1 mg/dL (ref 8.4–10.5)
GFR: 53.29 mL/min — AB (ref >60.00)
Glucose, Bld: 112 mg/dL — ABNORMAL HIGH (ref 70–99)
Potassium: 4.4 mEq/L (ref 3.5–5.1)
Sodium: 140 mEq/L (ref 135–145)

## 2013-04-01 LAB — CBC WITH DIFFERENTIAL/PLATELET
BASOS ABS: 0.1 10*3/uL (ref 0.0–0.1)
Basophils Relative: 0.6 % (ref 0.0–3.0)
EOS PCT: 4.6 % (ref 0.0–5.0)
Eosinophils Absolute: 0.4 10*3/uL (ref 0.0–0.7)
HCT: 36.8 % (ref 36.0–46.0)
Hemoglobin: 12.3 g/dL (ref 12.0–15.0)
LYMPHS PCT: 21.3 % (ref 12.0–46.0)
Lymphs Abs: 1.9 10*3/uL (ref 0.7–4.0)
MCHC: 33.5 g/dL (ref 30.0–36.0)
MCV: 90.9 fl (ref 78.0–100.0)
Monocytes Absolute: 0.7 10*3/uL (ref 0.1–1.0)
Monocytes Relative: 8.4 % (ref 3.0–12.0)
Neutro Abs: 5.7 10*3/uL (ref 1.4–7.7)
Neutrophils Relative %: 65.1 % (ref 43.0–77.0)
Platelets: 218 10*3/uL (ref 150.0–400.0)
RBC: 4.05 Mil/uL (ref 3.87–5.11)
RDW: 13.7 % (ref 11.5–14.6)
WBC: 8.7 10*3/uL (ref 4.5–10.5)

## 2013-04-01 LAB — HEPATIC FUNCTION PANEL
ALK PHOS: 62 U/L (ref 39–117)
ALT: 18 U/L (ref 0–35)
AST: 23 U/L (ref 0–37)
Albumin: 3.5 g/dL (ref 3.5–5.2)
Bilirubin, Direct: 0 mg/dL (ref 0.0–0.3)
TOTAL PROTEIN: 7.1 g/dL (ref 6.0–8.3)
Total Bilirubin: 0.5 mg/dL (ref 0.3–1.2)

## 2013-04-01 LAB — LIPID PANEL
CHOLESTEROL: 133 mg/dL (ref 0–200)
HDL: 39.4 mg/dL (ref >39.00)
LDL CALC: 68 mg/dL (ref 0–99)
TRIGLYCERIDES: 130 mg/dL (ref 0.0–149.0)
Total CHOL/HDL Ratio: 3
VLDL: 26 mg/dL (ref 0.0–40.0)

## 2013-04-01 LAB — HEMOGLOBIN A1C: HEMOGLOBIN A1C: 6.8 % — AB (ref 4.6–6.5)

## 2013-04-01 LAB — TSH: TSH: 0.95 u[IU]/mL (ref 0.35–5.50)

## 2013-04-01 NOTE — Progress Notes (Signed)
   Subjective:    Patient ID: Christina Pittman, female    DOB: 05-Feb-1919, 80 y.o.   MRN: [id number]  HPI Here today for CPE.  Risk Factors: DM- chronic problem.  Currently diet controlled.  Not on meds or checking sugars. HTN- chronic problem, on Lasix and Coreg w/ adequate control.  No CP, SOB, HAs, visual changes.  + swelling of L leg Hyperlipidemia- chronic problem, on Simvastatin 20mg  daily.  No abd pain, N/V Physical Activity: none, pt sits in wheelchair and needs assistance to transfer Fall Risk: high Depression: pt has ongoing sadness about inability to walk Hearing: decreased to conversational tones at 6 ft ADL's: dependent Cognitive: difficulty w/ memory per daughter's report Home Safety: pt reports feeling safe at home, has various family members staying w/ her to take care of her round the clock Height, Weight, BMI, Visual Acuity: see vitals, vision corrected to 20/20 w/ glasses Counseling: pt declines mammo, colonoscopy, DEXA, pap Labs Ordered: See A&P Care Plan: See A&P    Review of Systems Patient reports no vision/ hearing changes, adenopathy, fever, weight change,  persistant/recurrent hoarseness, swallowing issues, chest pain, palpitations, edema, persistant/recurrent cough, hemoptysis, dyspnea (rest/exertional/paroxysmal nocturnal), gastrointestinal bleeding (melena, rectal bleeding), abdominal pain, significant heartburn, bowel changes, GU symptoms (dysuria, hematuria, incontinence), Gyn symptoms (abnormal  bleeding, pain),  syncope, memory loss, numbness & tingling, skin/hair/nail changes, abnormal bruising or bleeding, anxiety, or depression.  + residual weakness of R arm     Objective:   Physical Exam General Appearance:    Alert, cooperative, no distress, appears stated age  Head:    Normocephalic, without obvious abnormality, atraumatic  Eyes:    PERRL, conjunctiva/corneas clear, EOM's intact, fundi    benign, both eyes  Ears:    Normal TM's and external ear  canals, both ears  Nose:   Nares normal, septum midline, mucosa normal, no drainage    or sinus tenderness  Throat:   Lips, mucosa, and tongue normal; teeth and gums normal  Neck:   Supple, symmetrical, trachea midline, no adenopathy;    Thyroid: no enlargement/tenderness/nodules  Back:     Symmetric, no curvature, ROM normal, no CVA tenderness  Lungs:     Clear to auscultation bilaterally, respirations unlabored  Chest Wall:    No tenderness or deformity   Heart:    Regular rate and rhythm, S1 and S2 normal, II/VI SEM  Breast Exam:    Deferred  Abdomen:     Soft, non-tender, bowel sounds active all four quadrants,    no masses, no organomegaly  Genitalia:    Deferred  Rectal:    Extremities:   Extremities normal, atraumatic, no cyanosis, 1-2+ L LE edema (R LE surgically removed)  Pulses:   2+ and symmetric all extremities  Skin:   Skin color, texture, turgor normal, no rashes or lesions  Lymph nodes:   Cervical, supraclavicular, and axillary nodes normal  Neurologic:   CNII-XII intact, R side residual weakness s/p CVA          Assessment & Plan:

## 2013-04-01 NOTE — Progress Notes (Signed)
Pre visit review using our clinic review tool, if applicable. No additional management support is needed unless otherwise documented below in the visit note. 

## 2013-04-01 NOTE — Patient Instructions (Signed)
Follow up in 6 months to recheck diabetes We'll notify you of your lab results and make any changes if needed Call with any questions or concerns [place] (we hope!)

## 2013-04-02 ENCOUNTER — Telehealth: Payer: Self-pay | Admitting: Family Medicine

## 2013-04-02 ENCOUNTER — Other Ambulatory Visit: Payer: Self-pay | Admitting: Family Medicine

## 2013-04-02 NOTE — Assessment & Plan Note (Signed)
R AKA

## 2013-04-02 NOTE — Assessment & Plan Note (Signed)
Chronic problem.  Asymptomatic.  Adequate control.  Check labs.  No anticipated med changes.

## 2013-04-02 NOTE — Assessment & Plan Note (Signed)
R sided residuals

## 2013-04-02 NOTE — Assessment & Plan Note (Signed)
Chronic problem, tolerating statin w/o difficulty.  Check labs.  Adjust meds prn  

## 2013-04-02 NOTE — Telephone Encounter (Signed)
emmi report mailed to patient ° °

## 2013-04-02 NOTE — Assessment & Plan Note (Signed)
Pt's PE WNL w/ exception of known R sided residuals from CVA and R AKA.  Pt is no longer participating in health maintenance due to age.  Check labs.  Anticipatory guidance provided.

## 2013-04-02 NOTE — Assessment & Plan Note (Signed)
Chronic problem, not currently on meds.  Attempting to control w/ diet.  Asymptomatic.  Check labs, start meds prn.

## 2013-04-05 ENCOUNTER — Other Ambulatory Visit: Payer: Self-pay | Admitting: General Practice

## 2013-04-05 MED ORDER — CLOPIDOGREL BISULFATE 75 MG PO TABS: ORAL_TABLET | ORAL | Status: DC

## 2013-04-05 MED ORDER — SIMVASTATIN 20 MG PO TABS: 20.0000 mg | ORAL_TABLET | Freq: Every day | ORAL | Status: DC

## 2013-04-05 NOTE — Telephone Encounter (Signed)
Med filled.  

## 2013-04-14 ENCOUNTER — Encounter: Payer: Self-pay | Admitting: Nurse Practitioner

## 2013-04-14 ENCOUNTER — Ambulatory Visit (INDEPENDENT_AMBULATORY_CARE_PROVIDER_SITE_OTHER): Payer: Medicare Other | Admitting: Nurse Practitioner

## 2013-04-14 VITALS — BP 148/101 | HR 57 | Temp 97.9°F | Ht 64.0 in

## 2013-04-14 DIAGNOSIS — L0100 Impetigo, unspecified: Secondary | ICD-10-CM

## 2013-04-14 DIAGNOSIS — L251 Unspecified contact dermatitis due to drugs in contact with skin: Secondary | ICD-10-CM

## 2013-04-14 DIAGNOSIS — I6350 Cerebral infarction due to unspecified occlusion or stenosis of unspecified cerebral artery: Secondary | ICD-10-CM

## 2013-04-14 MED ORDER — CEPHALEXIN 250 MG PO CAPS: 500.0000 mg | ORAL_CAPSULE | Freq: Four times a day (QID) | ORAL | Status: DC

## 2013-04-14 NOTE — Patient Instructions (Signed)
I think you have an infection called impetigo and a reaction to bacitracin ointment. Stop using bacitracin. Start antibiotic. Daily skin care: wash with mild soap 2-3 times daily, removing crusts; apply vaseline if nail bed still tender from nail removal. Apply clean dry gauze over blisters. Change gauze as needed to keep clean & dry 4-5 times daily.   Impetigo Impetigo is an infection of the skin, most common in babies and children.  CAUSES  It is caused by staphylococcal or streptococcal germs (bacteria). Impetigo can start after any damage to the skin. The damage to the skin may be from things like:   Chickenpox.  Scrapes.  Scratches.  Insect bites (common when children scratch the bite).  Cuts.  Nail biting or chewing. Impetigo is contagious. It can be spread from one person to another. Avoid close skin contact, or sharing towels or clothing. SYMPTOMS  Impetigo usually starts out as small blisters or pustules. Then they turn into tiny yellow-crusted sores (lesions).  There may also be:  Large blisters.  Itching or pain.  Pus.  Swollen lymph glands. With scratching, irritation, or non-treatment, these small areas may get larger. Scratching can cause the germs to get under the fingernails; then scratching another part of the skin can cause the infection to be spread there. DIAGNOSIS  Diagnosis of impetigo is usually made by a physical exam. A skin culture (test to grow bacteria) may be done to prove the diagnosis or to help decide the best treatment.  TREATMENT  Mild impetigo can be treated with prescription antibiotic cream. Oral antibiotic medicine may be used in more severe cases. Medicines for itching may be used. HOME CARE INSTRUCTIONS   To avoid spreading impetigo to other body areas:  Keep fingernails short and clean.  Avoid scratching.  Cover infected areas if necessary to keep from scratching.  Gently wash the infected areas with antibiotic soap and  water.  Soak crusted areas in warm soapy water using antibiotic soap.  Gently rub the areas to remove crusts. Do not scrub.  Wash hands often to avoid spread this infection.  Keep children with impetigo home from school or daycare until they have used an antibiotic cream for 48 hours (2 days) or oral antibiotic medicine for 24 hours (1 day), and their skin shows significant improvement.  Children may attend school or daycare if they only have a few sores and if the sores can be covered by a bandage or clothing. SEEK MEDICAL CARE IF:   More blisters or sores show up despite treatment.  Other family members get sores.  Rash is not improving after 48 hours (2 days) of treatment. SEEK IMMEDIATE MEDICAL CARE IF:   You see spreading redness or swelling of the skin around the sores.  You see red streaks coming from the sores.  Your child develops a fever of 100.4 F (37.2 C) or higher.  Your child develops a sore throat.  Your child is acting ill (lethargic, sick to their stomach). Document Released: 01/12/2000 Document Revised: 04/08/2011 Document Reviewed: 11/11/2007 [facility] Patient Information 2014 [place].  Contact Dermatitis Contact dermatitis is a reaction to certain substances that touch the skin. Contact dermatitis can be either irritant contact dermatitis or allergic contact dermatitis. Irritant contact dermatitis does not require previous exposure to the substance for a reaction to occur.Allergic contact dermatitis only occurs if you have been exposed to the substance before. Upon a repeat exposure, your body reacts to the substance.  CAUSES  Many substances can  cause contact dermatitis. Irritant dermatitis is most commonly caused by repeated exposure to mildly irritating substances, such as:  Makeup.  Soaps.  Detergents.  Bleaches.  Acids.  Metal salts, such as nickel. Allergic contact dermatitis is most commonly caused by exposure to:  Poisonous  plants.  Chemicals (deodorants, shampoos).  Jewelry.  Latex.  Neomycin in triple antibiotic cream.  Preservatives in products, including clothing. SYMPTOMS  The area of skin that is exposed may develop:  Dryness or flaking.  Redness.  Cracks.  Itching.  Pain or a burning sensation.  Blisters. With allergic contact dermatitis, there may also be swelling in areas such as the eyelids, mouth, or genitals.  DIAGNOSIS  Your caregiver can usually tell what the problem is by doing a physical exam. In cases where the cause is uncertain and an allergic contact dermatitis is suspected, a patch skin test may be performed to help determine the cause of your dermatitis. TREATMENT Treatment includes protecting the skin from further contact with the irritating substance by avoiding that substance if possible. Barrier creams, powders, and gloves may be helpful. Your caregiver may also recommend:  Steroid creams or ointments applied 2 times daily. For best results, soak the rash area in cool water for 20 minutes. Then apply the medicine. Cover the area with a plastic wrap. You can store the steroid cream in the refrigerator for a "chilly" effect on your rash. That may decrease itching. Oral steroid medicines may be needed in more severe cases.  Antibiotics or antibacterial ointments if a skin infection is present.  Antihistamine lotion or an antihistamine taken by mouth to ease itching.  Lubricants to keep moisture in your skin.  Burow's solution to reduce redness and soreness or to dry a weeping rash. Mix one packet or tablet of solution in 2 cups cool water. Dip a clean washcloth in the mixture, wring it out a bit, and put it on the affected area. Leave the cloth in place for 30 minutes. Do this as often as possible throughout the day.  Taking several cornstarch or baking soda baths daily if the area is too large to cover with a washcloth. Harsh chemicals, such as alkalis or acids, can  cause skin damage that is like a burn. You should flush your skin for 15 to 20 minutes with cold water after such an exposure. You should also seek immediate medical care after exposure. Bandages (dressings), antibiotics, and pain medicine may be needed for severely irritated skin.  HOME CARE INSTRUCTIONS  Avoid the substance that caused your reaction.  Keep the area of skin that is affected away from hot water, soap, sunlight, chemicals, acidic substances, or anything else that would irritate your skin.  Do not scratch the rash. Scratching may cause the rash to become infected.  You may take cool baths to help stop the itching.  Only take over-the-counter or prescription medicines as directed by your caregiver.  See your caregiver for follow-up care as directed to make sure your skin is healing properly. SEEK MEDICAL CARE IF:   Your condition is not better after 3 days of treatment.  You seem to be getting worse.  You see signs of infection such as swelling, tenderness, redness, soreness, or warmth in the affected area.  You have any problems related to your medicines. Document Released: 01/12/2000 Document Revised: 04/08/2011 Document Reviewed: 06/19/2010 [facility] Patient Information 2014 [place], [place].

## 2013-04-14 NOTE — Progress Notes (Signed)
   Subjective:    Patient ID: Christina Pittman, female    DOB: Dec 16, 1919, 80 y.o.   MRN: [id number]  Toe Pain  The incident occurred 12 to 24 hours ago (pt had L great toenail removed several days ago. Her grandaughter noticed blisters on toe this am. She has been cleaning nail bed with "woundcare solution" & applying bacitracin ointment daily.). The quality of the pain is described as burning. The pain is mild. The pain has been constant since onset. Pertinent negatives include no inability to bear weight (pt is wheelchair bound secondary to R leg amputation). She reports no foreign bodies present.      Review of Systems  Constitutional: Negative for fever, chills, activity change, appetite change and fatigue.  Skin: Positive for color change, rash and wound.       L great toe red, blisters, recent nail removal       Objective:   Physical Exam  Vitals reviewed. Constitutional: She is oriented to person, place, and time. She appears well-developed and well-nourished. No distress.  Pt accompanied by daughter & grand daughter. She is in wheelchair. Has Hx of R LE amputation.  HENT:  Head: Normocephalic and atraumatic.  Neck: Normal range of motion. Neck supple. No thyromegaly present.  Cardiovascular: Normal rate.   Pulmonary/Chest: Effort normal. No respiratory distress. She has no wheezes. She has no rales.  Musculoskeletal:       Feet:  Lymphadenopathy:    She has no cervical adenopathy.  Neurological: She is alert and oriented to person, place, and time.  Skin: Skin is warm. Rash noted. There is erythema.  L great toe. see pic.  Psychiatric: She has a normal mood and affect. Her behavior is normal.          Assessment & Plan:  1. Impetigo Yellow crusts at nailbed. Blisters & erythema adjacent to nail bed. Nail removed. Likely overlap of infection & contact derm reaction to bacitracin. - cephALEXin (KEFLEX) 250 MG capsule; Take 2 capsules (500 mg total) by mouth 4 (four)  times daily.  Dispense: 40 capsule; Refill: 0 Stop bacitracin. Skin care instructions given. See pt instructions. 2. Contact dermatitis due to drugs and medicine See above

## 2013-04-14 NOTE — Progress Notes (Signed)
Pre visit review using our clinic review tool, if applicable. No additional management support is needed unless otherwise documented below in the visit note. 

## 2013-05-15 ENCOUNTER — Other Ambulatory Visit: Payer: Self-pay | Admitting: Family Medicine

## 2013-05-17 NOTE — Telephone Encounter (Signed)
Med filled.  

## 2013-05-22 ENCOUNTER — Other Ambulatory Visit: Payer: Self-pay | Admitting: Family Medicine

## 2013-05-24 NOTE — Telephone Encounter (Signed)
Med filled.  

## 2013-06-02 ENCOUNTER — Telehealth: Payer: Self-pay | Admitting: *Deleted

## 2013-06-02 ENCOUNTER — Other Ambulatory Visit: Payer: Self-pay | Admitting: General Practice

## 2013-06-02 DIAGNOSIS — Z89619 Acquired absence of unspecified leg above knee: Secondary | ICD-10-CM

## 2013-06-02 MED ORDER — NONFORMULARY OR COMPOUNDED ITEM: Status: DC

## 2013-06-02 NOTE — Telephone Encounter (Signed)
Patient's daughter walked into the office this afternoon requesting prescription for Right AK socket for patient's prosthetic. Script was printed and faxed to [facility] at [phone] per daughter's request. JG//CMA

## 2013-06-30 ENCOUNTER — Encounter: Payer: Self-pay | Admitting: Internal Medicine

## 2013-06-30 ENCOUNTER — Ambulatory Visit (INDEPENDENT_AMBULATORY_CARE_PROVIDER_SITE_OTHER): Payer: Medicare Other | Admitting: Internal Medicine

## 2013-06-30 VITALS — BP 132/67 | HR 71 | Temp 97.8°F

## 2013-06-30 DIAGNOSIS — I6350 Cerebral infarction due to unspecified occlusion or stenosis of unspecified cerebral artery: Secondary | ICD-10-CM

## 2013-06-30 DIAGNOSIS — J209 Acute bronchitis, unspecified: Secondary | ICD-10-CM

## 2013-06-30 MED ORDER — AZITHROMYCIN 250 MG PO TABS: ORAL_TABLET | ORAL | Status: DC

## 2013-06-30 NOTE — Progress Notes (Signed)
Pre visit review using our clinic review tool, if applicable. No additional management support is needed unless otherwise documented below in the visit note. 

## 2013-06-30 NOTE — Progress Notes (Signed)
Subjective:    Patient ID: Christina Pittman, female    DOB: 1919-11-11, 80 y.o.   MRN: [id number]  DOS:  06/30/2013 Type of  Visit: Acute, gere w/ her daughter History:  Symptoms started 3 days ago with cough, subjective fever, decreased appetite. The patient reports some shortness or breath, the patient's daughter has not observed difficulty breathing.   ROS Denies any runny nose or sore throat. No nausea, vomiting, diarrhea or blood in the stools. No sputum production, some sneezing. Eyes are irritated since the onset of symptoms  Past Medical History  Diagnosis Date  . Arthritis   . Diabetes mellitus   . Hypertension   . Heart murmur   . Chicken pox   . Fractured pelvis 05/14/2011  . Stroke     feb 2013  . Pituitary macroadenoma   . Insomnia   . Hyperlipemia   . Hemiparesis, right   . Dysphagia   . Bilateral venous insufficiency   . Aspiration pneumonia   . Aneurysm of thoracic aorta   . Gangrene of foot     Past Surgical History  Procedure Laterality Date  . Abdominal hysterectomy    . Above knee leg amputation      History   Social History  . Marital Status: Widowed    Spouse Name: N/A    Number of Children: N/A  . Years of Education: N/A   Occupational History  . Not on file.   Social History Main Topics  . Smoking status: Never Smoker   . Smokeless tobacco: Not on file  . Alcohol Use: No  . Drug Use: No  . Sexual Activity:    Other Topics Concern  . Not on file   Social History Narrative  . No narrative on file        Medication List       This list is accurate as of: 06/30/13 11:59 PM.  Always use your most recent med list.               azithromycin 250 MG tablet  Commonly known as:  ZITHROMAX Z-PAK  As directed     carvedilol 25 MG tablet  Commonly known as:  COREG  TAKE ONE TABLET BY MOUTH TWICE DAILY WITH FOOD     clopidogrel 75 MG tablet  Commonly known as:  PLAVIX  TAKE ONE TABLET BY MOUTH ONCE DAILY WITH  BREAKFAST     furosemide 20 MG tablet  Commonly known as:  LASIX  TAKE ONE TABLET BY MOUTH ONCE DAILY AS NEEDED FOR SWELLING     NONFORMULARY OR COMPOUNDED ITEM  Pt is in need of a Right AK Socket for her prosthetic.     simvastatin 20 MG tablet  Commonly known as:  ZOCOR  Take 1 tablet (20 mg total) by mouth daily at 6 PM.     vitamin D (CHOLECALCIFEROL) 400 UNITS tablet  Take 400 Units by mouth 2 (two) times daily.           Objective:   Physical Exam BP 132/67  Pulse 71  Temp(Src) 97.8 F (36.6 C)  SpO2 98%  General -- alert, well-developed, NAD. VSS   HEENT--  Conjunctiva slightly red bilaterally, + white discharge in the corner of the eyes bilaterally. EOMI Lungs -- normal respiratory effort, no intercostal retractions, no accessory muscle use, and normal breath sounds. No increased work of breathing Heart-- normal rate, regular rhythm, + syst murmur.   Neurologic--  alert & oriented X3.  Psych-- Cognition and judgment appear intact. Cooperative with normal attention span and concentration. No anxious or depressed appearing, Seems in good spirits.       Assessment & Plan:   URI-bronchitis 80 year old lady with respiratory symptoms  , Subjective fever and shortness of breath. Vital signs are stable, exam is benign. Plan: Treat with Zithromax, see instructions. Definitely call if she is not improving soon or if she starts to get worse. Patient and daughter are in agreement with the plan

## 2013-06-30 NOTE — Patient Instructions (Signed)
Rest, fluids , tylenol For cough, take robittusin DM as needed  For eye irritation: clean the eye with a soft towel with warm water (not hot!) , use OTC artificial tears as needed Take the antibiotic as prescribed  (zitrhomax) Call if no better in few days Call anytime if the symptoms are severe

## 2013-07-06 ENCOUNTER — Telehealth: Payer: Self-pay | Admitting: *Deleted

## 2013-07-06 NOTE — Telephone Encounter (Signed)
Received fax from [facility] for an order for RT/AK Replacement Socket and medical records request for April and May of 2015. Order forwarded to Dr. Birdie Riddle for signature. JG//CMA

## 2013-08-05 ENCOUNTER — Encounter: Payer: Self-pay | Admitting: Family Medicine

## 2013-08-05 ENCOUNTER — Ambulatory Visit (INDEPENDENT_AMBULATORY_CARE_PROVIDER_SITE_OTHER): Payer: Medicare Other | Admitting: Family Medicine

## 2013-08-05 ENCOUNTER — Encounter: Payer: Self-pay | Admitting: General Practice

## 2013-08-05 VITALS — BP 124/88 | HR 58 | Temp 97.8°F | Resp 17

## 2013-08-05 DIAGNOSIS — S81809A Unspecified open wound, unspecified lower leg, initial encounter: Secondary | ICD-10-CM | POA: Diagnosis not present

## 2013-08-05 DIAGNOSIS — I6350 Cerebral infarction due to unspecified occlusion or stenosis of unspecified cerebral artery: Secondary | ICD-10-CM

## 2013-08-05 DIAGNOSIS — R609 Edema, unspecified: Secondary | ICD-10-CM

## 2013-08-05 DIAGNOSIS — E1159 Type 2 diabetes mellitus with other circulatory complications: Secondary | ICD-10-CM

## 2013-08-05 DIAGNOSIS — S81802A Unspecified open wound, left lower leg, initial encounter: Secondary | ICD-10-CM

## 2013-08-05 DIAGNOSIS — I872 Venous insufficiency (chronic) (peripheral): Secondary | ICD-10-CM

## 2013-08-05 LAB — CBC WITH DIFFERENTIAL/PLATELET
BASOS PCT: 0.4 % (ref 0.0–3.0)
Basophils Absolute: 0 10*3/uL (ref 0.0–0.1)
EOS ABS: 0.6 10*3/uL (ref 0.0–0.7)
Eosinophils Relative: 6.9 % — ABNORMAL HIGH (ref 0.0–5.0)
HCT: 36.5 % (ref 36.0–46.0)
Hemoglobin: 12.1 g/dL (ref 12.0–15.0)
Lymphocytes Relative: 22 % (ref 12.0–46.0)
Lymphs Abs: 1.9 10*3/uL (ref 0.7–4.0)
MCHC: 33.2 g/dL (ref 30.0–36.0)
MCV: 92.6 fl (ref 78.0–100.0)
MONO ABS: 0.7 10*3/uL (ref 0.1–1.0)
Monocytes Relative: 8.2 % (ref 3.0–12.0)
NEUTROS PCT: 62.5 % (ref 43.0–77.0)
Neutro Abs: 5.4 10*3/uL (ref 1.4–7.7)
Platelets: 221 10*3/uL (ref 150.0–400.0)
RBC: 3.94 Mil/uL (ref 3.87–5.11)
RDW: 14.8 % (ref 11.5–15.5)
WBC: 8.7 10*3/uL (ref 4.0–10.5)

## 2013-08-05 LAB — BASIC METABOLIC PANEL
BUN: 17 mg/dL (ref 6–23)
CHLORIDE: 103 meq/L (ref 96–112)
CO2: 26 mEq/L (ref 19–32)
Calcium: 9 mg/dL (ref 8.4–10.5)
Creatinine, Ser: 1.1 mg/dL (ref 0.4–1.2)
GFR: 52.08 mL/min — ABNORMAL LOW (ref >60.00)
Glucose, Bld: 106 mg/dL — ABNORMAL HIGH (ref 70–99)
POTASSIUM: 4.7 meq/L (ref 3.5–5.1)
Sodium: 137 mEq/L (ref 135–145)

## 2013-08-05 LAB — HEPATIC FUNCTION PANEL
ALK PHOS: 60 U/L (ref 39–117)
ALT: 14 U/L (ref 0–35)
AST: 22 U/L (ref 0–37)
Albumin: 3.3 g/dL — ABNORMAL LOW (ref 3.5–5.2)
BILIRUBIN DIRECT: 0 mg/dL (ref 0.0–0.3)
BILIRUBIN TOTAL: 0.6 mg/dL (ref 0.2–1.2)
Total Protein: 6.9 g/dL (ref 6.0–8.3)

## 2013-08-05 LAB — HEMOGLOBIN A1C: Hgb A1c MFr Bld: 6.5 % (ref 4.6–6.5)

## 2013-08-05 MED ORDER — CEPHALEXIN 500 MG PO CAPS: 500.0000 mg | ORAL_CAPSULE | Freq: Three times a day (TID) | ORAL | Status: AC

## 2013-08-05 NOTE — Progress Notes (Signed)
   Subjective:    Patient ID: Christina Pittman, female    DOB: 10/28/1919, 80 y.o.   MRN: [id number]  HPI Leg wound- L lower leg, pt had 'blood blister' that 1st appeared 3 weeks ago.  Pt then poked area w/ pin 2 weeks ago.  Area is not painful, 'it itches'.  Some drainage- 'watery blood'.  No pus noted.  Marked edema.  Pt has history of similar blood blister that turned to ulcer that required amputation of R leg.  Some SOB.   Review of Systems For ROS see HPI     Objective:   Physical Exam  Vitals reviewed. Constitutional: She appears well-developed and well-nourished. No distress.  Cardiovascular: Normal rate, regular rhythm and intact distal pulses.   Murmur (II/VI SEM heard best at RUSB) heard. Pulmonary/Chest: Effort normal and breath sounds normal. No respiratory distress. She has no wheezes. She has no rales.  Musculoskeletal: She exhibits edema (1-2+ pitting edema w/ serous fluid oozing from superficial cut on medial lower L leg).  Neurological: She is alert.  Oriented to person and place  Skin: Skin is warm and dry. There is erythema (mild erythema surrounding L lower leg lateral wound).  3 cm wound on L lower leg w/ partial scabbing.  No purulent drainage noted.  Cleaned w/ H2O2, bandaged.           Assessment & Plan:

## 2013-08-05 NOTE — Patient Instructions (Signed)
Follow up in 1 week to recheck wound We'll notify you of your lab results and make any changes if needed Start the Keflex 3x/day for infection Home health will contact you to come out and assess and care for your wound Call with any questions or concerns Hang in there!

## 2013-08-05 NOTE — Progress Notes (Signed)
Pre visit review using our clinic review tool, if applicable. No additional management support is needed unless otherwise documented below in the visit note. 

## 2013-08-06 ENCOUNTER — Telehealth: Payer: Self-pay | Admitting: Family Medicine

## 2013-08-06 DIAGNOSIS — S81009A Unspecified open wound, unspecified knee, initial encounter: Secondary | ICD-10-CM | POA: Diagnosis not present

## 2013-08-06 DIAGNOSIS — I798 Other disorders of arteries, arterioles and capillaries in diseases classified elsewhere: Secondary | ICD-10-CM | POA: Diagnosis not present

## 2013-08-06 DIAGNOSIS — S88119A Complete traumatic amputation at level between knee and ankle, unspecified lower leg, initial encounter: Secondary | ICD-10-CM | POA: Diagnosis not present

## 2013-08-06 DIAGNOSIS — L02419 Cutaneous abscess of limb, unspecified: Secondary | ICD-10-CM | POA: Diagnosis not present

## 2013-08-06 DIAGNOSIS — Z4800 Encounter for change or removal of nonsurgical wound dressing: Secondary | ICD-10-CM | POA: Diagnosis not present

## 2013-08-06 DIAGNOSIS — S81809A Unspecified open wound, unspecified lower leg, initial encounter: Secondary | ICD-10-CM | POA: Diagnosis not present

## 2013-08-06 DIAGNOSIS — E1159 Type 2 diabetes mellitus with other circulatory complications: Secondary | ICD-10-CM | POA: Diagnosis not present

## 2013-08-06 DIAGNOSIS — I509 Heart failure, unspecified: Secondary | ICD-10-CM | POA: Diagnosis not present

## 2013-08-06 DIAGNOSIS — I10 Essential (primary) hypertension: Secondary | ICD-10-CM | POA: Diagnosis not present

## 2013-08-06 DIAGNOSIS — Z9181 History of falling: Secondary | ICD-10-CM | POA: Diagnosis not present

## 2013-08-06 NOTE — Telephone Encounter (Signed)
No problem, case was faxed to [facility] this am.

## 2013-08-06 NOTE — Assessment & Plan Note (Signed)
New.  Pt has had wound x3 weeks.  Hx of non-healing wounds resulting in amputation.  Start Keflex to prevent cellulitis.  Refer to [place] for wound care.  Will follow closely.

## 2013-08-06 NOTE — Assessment & Plan Note (Signed)
Chronic problem.  Suspect this is due to multiple factors- pt's sedentary lifestyle, high salt diet, heat.  Reports she is taking her lasix.  Encouraged her to elevate her leg as much as possible.  Check labs to r/o underlying metabolic abnormality.  Will follow.

## 2013-08-06 NOTE — Assessment & Plan Note (Signed)
Chronic problem, not currently on meds b/c pt is eating much less.  Due for labs.  Adjust tx plan prn.

## 2013-08-06 NOTE — Telephone Encounter (Signed)
Caller name: Melissa  Relation to YB:[facility] home care Call back [phone]   Reason for call:   [place] referral was sent in for patient, but according to [facility], the [place]/[facility] office is not accepting new RN referrals at this time.  Just FYI

## 2013-08-06 NOTE — Assessment & Plan Note (Signed)
Chronic problem.  This resulted in previous non-healing wound and subsequent amputation after gangrene.  Given new wound on L leg, will get wound care involved as pt has already had area x3 weeks w/o improvement.  Pt expressed understanding and is in agreement w/ plan.

## 2013-08-09 DIAGNOSIS — I798 Other disorders of arteries, arterioles and capillaries in diseases classified elsewhere: Secondary | ICD-10-CM | POA: Diagnosis not present

## 2013-08-09 DIAGNOSIS — L02419 Cutaneous abscess of limb, unspecified: Secondary | ICD-10-CM | POA: Diagnosis not present

## 2013-08-09 DIAGNOSIS — S81009A Unspecified open wound, unspecified knee, initial encounter: Secondary | ICD-10-CM | POA: Diagnosis not present

## 2013-08-09 DIAGNOSIS — I509 Heart failure, unspecified: Secondary | ICD-10-CM | POA: Diagnosis not present

## 2013-08-09 DIAGNOSIS — I10 Essential (primary) hypertension: Secondary | ICD-10-CM | POA: Diagnosis not present

## 2013-08-09 DIAGNOSIS — E1159 Type 2 diabetes mellitus with other circulatory complications: Secondary | ICD-10-CM | POA: Diagnosis not present

## 2013-08-10 DIAGNOSIS — I509 Heart failure, unspecified: Secondary | ICD-10-CM | POA: Diagnosis not present

## 2013-08-10 DIAGNOSIS — E1159 Type 2 diabetes mellitus with other circulatory complications: Secondary | ICD-10-CM | POA: Diagnosis not present

## 2013-08-10 DIAGNOSIS — L02419 Cutaneous abscess of limb, unspecified: Secondary | ICD-10-CM | POA: Diagnosis not present

## 2013-08-10 DIAGNOSIS — I798 Other disorders of arteries, arterioles and capillaries in diseases classified elsewhere: Secondary | ICD-10-CM | POA: Diagnosis not present

## 2013-08-10 DIAGNOSIS — I10 Essential (primary) hypertension: Secondary | ICD-10-CM | POA: Diagnosis not present

## 2013-08-10 DIAGNOSIS — S81009A Unspecified open wound, unspecified knee, initial encounter: Secondary | ICD-10-CM | POA: Diagnosis not present

## 2013-08-11 DIAGNOSIS — S81009A Unspecified open wound, unspecified knee, initial encounter: Secondary | ICD-10-CM | POA: Diagnosis not present

## 2013-08-11 DIAGNOSIS — I509 Heart failure, unspecified: Secondary | ICD-10-CM | POA: Diagnosis not present

## 2013-08-11 DIAGNOSIS — L02419 Cutaneous abscess of limb, unspecified: Secondary | ICD-10-CM | POA: Diagnosis not present

## 2013-08-11 DIAGNOSIS — S81809A Unspecified open wound, unspecified lower leg, initial encounter: Secondary | ICD-10-CM | POA: Diagnosis not present

## 2013-08-11 DIAGNOSIS — L03119 Cellulitis of unspecified part of limb: Secondary | ICD-10-CM | POA: Diagnosis not present

## 2013-08-11 DIAGNOSIS — E1159 Type 2 diabetes mellitus with other circulatory complications: Secondary | ICD-10-CM | POA: Diagnosis not present

## 2013-08-11 DIAGNOSIS — I10 Essential (primary) hypertension: Secondary | ICD-10-CM | POA: Diagnosis not present

## 2013-08-11 DIAGNOSIS — I798 Other disorders of arteries, arterioles and capillaries in diseases classified elsewhere: Secondary | ICD-10-CM | POA: Diagnosis not present

## 2013-08-12 ENCOUNTER — Ambulatory Visit: Payer: Medicare Other | Admitting: Family Medicine

## 2013-08-13 DIAGNOSIS — L03119 Cellulitis of unspecified part of limb: Secondary | ICD-10-CM | POA: Diagnosis not present

## 2013-08-13 DIAGNOSIS — E1159 Type 2 diabetes mellitus with other circulatory complications: Secondary | ICD-10-CM | POA: Diagnosis not present

## 2013-08-13 DIAGNOSIS — I798 Other disorders of arteries, arterioles and capillaries in diseases classified elsewhere: Secondary | ICD-10-CM | POA: Diagnosis not present

## 2013-08-13 DIAGNOSIS — S81009A Unspecified open wound, unspecified knee, initial encounter: Secondary | ICD-10-CM | POA: Diagnosis not present

## 2013-08-13 DIAGNOSIS — S81809A Unspecified open wound, unspecified lower leg, initial encounter: Secondary | ICD-10-CM | POA: Diagnosis not present

## 2013-08-13 DIAGNOSIS — I509 Heart failure, unspecified: Secondary | ICD-10-CM | POA: Diagnosis not present

## 2013-08-13 DIAGNOSIS — L02419 Cutaneous abscess of limb, unspecified: Secondary | ICD-10-CM | POA: Diagnosis not present

## 2013-08-13 DIAGNOSIS — I10 Essential (primary) hypertension: Secondary | ICD-10-CM | POA: Diagnosis not present

## 2013-08-16 DIAGNOSIS — I10 Essential (primary) hypertension: Secondary | ICD-10-CM | POA: Diagnosis not present

## 2013-08-16 DIAGNOSIS — S81009A Unspecified open wound, unspecified knee, initial encounter: Secondary | ICD-10-CM | POA: Diagnosis not present

## 2013-08-16 DIAGNOSIS — L02419 Cutaneous abscess of limb, unspecified: Secondary | ICD-10-CM | POA: Diagnosis not present

## 2013-08-16 DIAGNOSIS — I798 Other disorders of arteries, arterioles and capillaries in diseases classified elsewhere: Secondary | ICD-10-CM | POA: Diagnosis not present

## 2013-08-16 DIAGNOSIS — E1159 Type 2 diabetes mellitus with other circulatory complications: Secondary | ICD-10-CM | POA: Diagnosis not present

## 2013-08-16 DIAGNOSIS — I509 Heart failure, unspecified: Secondary | ICD-10-CM | POA: Diagnosis not present

## 2013-08-18 DIAGNOSIS — L02419 Cutaneous abscess of limb, unspecified: Secondary | ICD-10-CM

## 2013-08-18 DIAGNOSIS — L03119 Cellulitis of unspecified part of limb: Secondary | ICD-10-CM

## 2013-08-18 DIAGNOSIS — S81809A Unspecified open wound, unspecified lower leg, initial encounter: Secondary | ICD-10-CM

## 2013-08-18 DIAGNOSIS — I509 Heart failure, unspecified: Secondary | ICD-10-CM | POA: Diagnosis not present

## 2013-08-18 DIAGNOSIS — S81009A Unspecified open wound, unspecified knee, initial encounter: Secondary | ICD-10-CM

## 2013-08-18 DIAGNOSIS — I10 Essential (primary) hypertension: Secondary | ICD-10-CM | POA: Diagnosis not present

## 2013-08-18 DIAGNOSIS — E1159 Type 2 diabetes mellitus with other circulatory complications: Secondary | ICD-10-CM | POA: Diagnosis not present

## 2013-08-18 DIAGNOSIS — S91009A Unspecified open wound, unspecified ankle, initial encounter: Secondary | ICD-10-CM

## 2013-08-18 DIAGNOSIS — I798 Other disorders of arteries, arterioles and capillaries in diseases classified elsewhere: Secondary | ICD-10-CM | POA: Diagnosis not present

## 2013-08-19 DIAGNOSIS — S81009A Unspecified open wound, unspecified knee, initial encounter: Secondary | ICD-10-CM | POA: Diagnosis not present

## 2013-08-19 DIAGNOSIS — S91009A Unspecified open wound, unspecified ankle, initial encounter: Secondary | ICD-10-CM | POA: Diagnosis not present

## 2013-08-19 DIAGNOSIS — I798 Other disorders of arteries, arterioles and capillaries in diseases classified elsewhere: Secondary | ICD-10-CM | POA: Diagnosis not present

## 2013-08-19 DIAGNOSIS — I10 Essential (primary) hypertension: Secondary | ICD-10-CM | POA: Diagnosis not present

## 2013-08-19 DIAGNOSIS — L02419 Cutaneous abscess of limb, unspecified: Secondary | ICD-10-CM | POA: Diagnosis not present

## 2013-08-19 DIAGNOSIS — E1159 Type 2 diabetes mellitus with other circulatory complications: Secondary | ICD-10-CM | POA: Diagnosis not present

## 2013-08-19 DIAGNOSIS — I509 Heart failure, unspecified: Secondary | ICD-10-CM | POA: Diagnosis not present

## 2013-08-20 DIAGNOSIS — S81809A Unspecified open wound, unspecified lower leg, initial encounter: Secondary | ICD-10-CM | POA: Diagnosis not present

## 2013-08-20 DIAGNOSIS — I509 Heart failure, unspecified: Secondary | ICD-10-CM | POA: Diagnosis not present

## 2013-08-20 DIAGNOSIS — E1159 Type 2 diabetes mellitus with other circulatory complications: Secondary | ICD-10-CM | POA: Diagnosis not present

## 2013-08-20 DIAGNOSIS — S81009A Unspecified open wound, unspecified knee, initial encounter: Secondary | ICD-10-CM | POA: Diagnosis not present

## 2013-08-20 DIAGNOSIS — I10 Essential (primary) hypertension: Secondary | ICD-10-CM | POA: Diagnosis not present

## 2013-08-20 DIAGNOSIS — I798 Other disorders of arteries, arterioles and capillaries in diseases classified elsewhere: Secondary | ICD-10-CM | POA: Diagnosis not present

## 2013-08-20 DIAGNOSIS — L02419 Cutaneous abscess of limb, unspecified: Secondary | ICD-10-CM | POA: Diagnosis not present

## 2013-08-23 DIAGNOSIS — S81009A Unspecified open wound, unspecified knee, initial encounter: Secondary | ICD-10-CM | POA: Diagnosis not present

## 2013-08-23 DIAGNOSIS — I10 Essential (primary) hypertension: Secondary | ICD-10-CM | POA: Diagnosis not present

## 2013-08-23 DIAGNOSIS — L02419 Cutaneous abscess of limb, unspecified: Secondary | ICD-10-CM | POA: Diagnosis not present

## 2013-08-23 DIAGNOSIS — E1159 Type 2 diabetes mellitus with other circulatory complications: Secondary | ICD-10-CM | POA: Diagnosis not present

## 2013-08-23 DIAGNOSIS — S91009A Unspecified open wound, unspecified ankle, initial encounter: Secondary | ICD-10-CM | POA: Diagnosis not present

## 2013-08-23 DIAGNOSIS — I798 Other disorders of arteries, arterioles and capillaries in diseases classified elsewhere: Secondary | ICD-10-CM | POA: Diagnosis not present

## 2013-08-23 DIAGNOSIS — I509 Heart failure, unspecified: Secondary | ICD-10-CM | POA: Diagnosis not present

## 2013-08-24 ENCOUNTER — Telehealth: Payer: Self-pay | Admitting: *Deleted

## 2013-08-24 NOTE — Telephone Encounter (Signed)
Received completed and signed [place] and POC form from Dr. Birdie Riddle.  All forms mailed, unable to fax.  [facility] aware.//AB/CMA

## 2013-08-25 ENCOUNTER — Telehealth: Payer: Self-pay | Admitting: Family Medicine

## 2013-08-25 NOTE — Telephone Encounter (Signed)
Spoke with Nestor Lewandowsky, pt's daughter, who states that the unna boot was applied Monday and pt just took dressing off today 3 pm.  Spoke with patient who states that she removes the dressing because it becomes too tight and it hurts.  Pt states that it feels so uncomfortable that it feels like it is cutting off her circulation.  Discussed with both patient and Baker Janus the importance leaving the dressing in place and following the direction of the nurse to aid in healing.  They were also encouraged to call the nurse when the dressing becomes too uncomfortable.  Pt and daughter stated understanding and agreed they would.  Called and spoke with Patty and made her aware that Dr. Birdie Riddle does not want to stop skilled nursing service.  Chong Sicilian was also made aware of patient's complaint regarding the wraps being uncomfortable and therefore removes the dressing for this reason.  Patty stated that she has tried educating the patient as well as every family member that helps with her care and the patient is still nonadherent to the wound care orders as well as other orders as well.  Patty stated that they would continue services and will update [place] periodically.

## 2013-08-25 NOTE — Telephone Encounter (Signed)
NO!  B/c of the noncompliance and hx of wound infection requiring amputation- it is even more important that skilled nursing see pt.  Please attempt to talk w/ family about importance of following direction

## 2013-08-25 NOTE — Telephone Encounter (Signed)
Caller name: Patty Relation to pt: [place] Call back [phone] Pharmacy:  Reason for call: pt and family is non-compliant with pt plan of care. The wound dressings are being removed, they have made 5 visits within the past 2 weeks to replace the dressing.  They would like orders to discontinue skilled nursing services. Please callback with a verbal order

## 2013-08-26 DIAGNOSIS — I798 Other disorders of arteries, arterioles and capillaries in diseases classified elsewhere: Secondary | ICD-10-CM | POA: Diagnosis not present

## 2013-08-26 DIAGNOSIS — E1159 Type 2 diabetes mellitus with other circulatory complications: Secondary | ICD-10-CM | POA: Diagnosis not present

## 2013-08-26 DIAGNOSIS — S81009A Unspecified open wound, unspecified knee, initial encounter: Secondary | ICD-10-CM | POA: Diagnosis not present

## 2013-08-26 DIAGNOSIS — I10 Essential (primary) hypertension: Secondary | ICD-10-CM | POA: Diagnosis not present

## 2013-08-26 DIAGNOSIS — I509 Heart failure, unspecified: Secondary | ICD-10-CM | POA: Diagnosis not present

## 2013-08-26 DIAGNOSIS — L02419 Cutaneous abscess of limb, unspecified: Secondary | ICD-10-CM | POA: Diagnosis not present

## 2013-08-31 DIAGNOSIS — E1159 Type 2 diabetes mellitus with other circulatory complications: Secondary | ICD-10-CM | POA: Diagnosis not present

## 2013-08-31 DIAGNOSIS — I10 Essential (primary) hypertension: Secondary | ICD-10-CM | POA: Diagnosis not present

## 2013-08-31 DIAGNOSIS — S81009A Unspecified open wound, unspecified knee, initial encounter: Secondary | ICD-10-CM | POA: Diagnosis not present

## 2013-08-31 DIAGNOSIS — S81809A Unspecified open wound, unspecified lower leg, initial encounter: Secondary | ICD-10-CM | POA: Diagnosis not present

## 2013-08-31 DIAGNOSIS — I509 Heart failure, unspecified: Secondary | ICD-10-CM | POA: Diagnosis not present

## 2013-08-31 DIAGNOSIS — I798 Other disorders of arteries, arterioles and capillaries in diseases classified elsewhere: Secondary | ICD-10-CM | POA: Diagnosis not present

## 2013-08-31 DIAGNOSIS — L02419 Cutaneous abscess of limb, unspecified: Secondary | ICD-10-CM | POA: Diagnosis not present

## 2013-09-02 DIAGNOSIS — I10 Essential (primary) hypertension: Secondary | ICD-10-CM | POA: Diagnosis not present

## 2013-09-02 DIAGNOSIS — I798 Other disorders of arteries, arterioles and capillaries in diseases classified elsewhere: Secondary | ICD-10-CM | POA: Diagnosis not present

## 2013-09-02 DIAGNOSIS — I509 Heart failure, unspecified: Secondary | ICD-10-CM | POA: Diagnosis not present

## 2013-09-02 DIAGNOSIS — E1159 Type 2 diabetes mellitus with other circulatory complications: Secondary | ICD-10-CM | POA: Diagnosis not present

## 2013-09-02 DIAGNOSIS — L02419 Cutaneous abscess of limb, unspecified: Secondary | ICD-10-CM | POA: Diagnosis not present

## 2013-09-02 DIAGNOSIS — S81009A Unspecified open wound, unspecified knee, initial encounter: Secondary | ICD-10-CM | POA: Diagnosis not present

## 2013-09-02 DIAGNOSIS — S91009A Unspecified open wound, unspecified ankle, initial encounter: Secondary | ICD-10-CM | POA: Diagnosis not present

## 2013-09-06 ENCOUNTER — Telehealth: Payer: Self-pay

## 2013-09-06 DIAGNOSIS — E1159 Type 2 diabetes mellitus with other circulatory complications: Secondary | ICD-10-CM | POA: Diagnosis not present

## 2013-09-06 DIAGNOSIS — S81009A Unspecified open wound, unspecified knee, initial encounter: Secondary | ICD-10-CM | POA: Diagnosis not present

## 2013-09-06 DIAGNOSIS — I509 Heart failure, unspecified: Secondary | ICD-10-CM | POA: Diagnosis not present

## 2013-09-06 DIAGNOSIS — I798 Other disorders of arteries, arterioles and capillaries in diseases classified elsewhere: Secondary | ICD-10-CM | POA: Diagnosis not present

## 2013-09-06 DIAGNOSIS — I10 Essential (primary) hypertension: Secondary | ICD-10-CM | POA: Diagnosis not present

## 2013-09-06 DIAGNOSIS — L02419 Cutaneous abscess of limb, unspecified: Secondary | ICD-10-CM | POA: Diagnosis not present

## 2013-09-06 NOTE — Telephone Encounter (Signed)
Received Physical Verbal Order forms from [facility] on 09/01/13 regarding skilled nursing.  Billing sheet attached, forms labeled, and placed in Dr. Virgil Benedict red folder for review and signature.

## 2013-09-09 DIAGNOSIS — I10 Essential (primary) hypertension: Secondary | ICD-10-CM | POA: Diagnosis not present

## 2013-09-09 DIAGNOSIS — I509 Heart failure, unspecified: Secondary | ICD-10-CM | POA: Diagnosis not present

## 2013-09-09 DIAGNOSIS — L02419 Cutaneous abscess of limb, unspecified: Secondary | ICD-10-CM | POA: Diagnosis not present

## 2013-09-09 DIAGNOSIS — S81009A Unspecified open wound, unspecified knee, initial encounter: Secondary | ICD-10-CM | POA: Diagnosis not present

## 2013-09-09 DIAGNOSIS — E1159 Type 2 diabetes mellitus with other circulatory complications: Secondary | ICD-10-CM | POA: Diagnosis not present

## 2013-09-09 DIAGNOSIS — I798 Other disorders of arteries, arterioles and capillaries in diseases classified elsewhere: Secondary | ICD-10-CM | POA: Diagnosis not present

## 2013-09-09 DIAGNOSIS — L03119 Cellulitis of unspecified part of limb: Secondary | ICD-10-CM | POA: Diagnosis not present

## 2013-09-13 DIAGNOSIS — S81009A Unspecified open wound, unspecified knee, initial encounter: Secondary | ICD-10-CM | POA: Diagnosis not present

## 2013-09-13 DIAGNOSIS — L03119 Cellulitis of unspecified part of limb: Secondary | ICD-10-CM | POA: Diagnosis not present

## 2013-09-13 DIAGNOSIS — I509 Heart failure, unspecified: Secondary | ICD-10-CM | POA: Diagnosis not present

## 2013-09-13 DIAGNOSIS — I10 Essential (primary) hypertension: Secondary | ICD-10-CM | POA: Diagnosis not present

## 2013-09-13 DIAGNOSIS — E1159 Type 2 diabetes mellitus with other circulatory complications: Secondary | ICD-10-CM | POA: Diagnosis not present

## 2013-09-13 DIAGNOSIS — I798 Other disorders of arteries, arterioles and capillaries in diseases classified elsewhere: Secondary | ICD-10-CM | POA: Diagnosis not present

## 2013-09-13 DIAGNOSIS — L02419 Cutaneous abscess of limb, unspecified: Secondary | ICD-10-CM | POA: Diagnosis not present

## 2013-09-17 ENCOUNTER — Telehealth: Payer: Self-pay

## 2013-09-17 NOTE — Telephone Encounter (Signed)
Order received via fax:  Effective week of 09/19/13:  SN 1 W 1 for agency discharge.  Order labeled and placed in Dr. Virgil Benedict red folder for review and signature.

## 2013-09-18 DIAGNOSIS — L03119 Cellulitis of unspecified part of limb: Secondary | ICD-10-CM | POA: Diagnosis not present

## 2013-09-18 DIAGNOSIS — E1159 Type 2 diabetes mellitus with other circulatory complications: Secondary | ICD-10-CM | POA: Diagnosis not present

## 2013-09-18 DIAGNOSIS — I798 Other disorders of arteries, arterioles and capillaries in diseases classified elsewhere: Secondary | ICD-10-CM | POA: Diagnosis not present

## 2013-09-18 DIAGNOSIS — I10 Essential (primary) hypertension: Secondary | ICD-10-CM | POA: Diagnosis not present

## 2013-09-18 DIAGNOSIS — S81009A Unspecified open wound, unspecified knee, initial encounter: Secondary | ICD-10-CM | POA: Diagnosis not present

## 2013-09-18 DIAGNOSIS — I509 Heart failure, unspecified: Secondary | ICD-10-CM | POA: Diagnosis not present

## 2013-09-18 DIAGNOSIS — L02419 Cutaneous abscess of limb, unspecified: Secondary | ICD-10-CM | POA: Diagnosis not present

## 2013-09-18 DIAGNOSIS — S91009A Unspecified open wound, unspecified ankle, initial encounter: Secondary | ICD-10-CM | POA: Diagnosis not present

## 2013-09-21 DIAGNOSIS — I509 Heart failure, unspecified: Secondary | ICD-10-CM | POA: Diagnosis not present

## 2013-09-21 DIAGNOSIS — S81009A Unspecified open wound, unspecified knee, initial encounter: Secondary | ICD-10-CM | POA: Diagnosis not present

## 2013-09-21 DIAGNOSIS — L02419 Cutaneous abscess of limb, unspecified: Secondary | ICD-10-CM | POA: Diagnosis not present

## 2013-09-21 DIAGNOSIS — E1159 Type 2 diabetes mellitus with other circulatory complications: Secondary | ICD-10-CM | POA: Diagnosis not present

## 2013-09-21 DIAGNOSIS — I798 Other disorders of arteries, arterioles and capillaries in diseases classified elsewhere: Secondary | ICD-10-CM | POA: Diagnosis not present

## 2013-09-21 DIAGNOSIS — I10 Essential (primary) hypertension: Secondary | ICD-10-CM | POA: Diagnosis not present

## 2013-09-23 ENCOUNTER — Encounter: Payer: Self-pay | Admitting: Family Medicine

## 2013-09-23 ENCOUNTER — Ambulatory Visit (INDEPENDENT_AMBULATORY_CARE_PROVIDER_SITE_OTHER): Payer: Medicare Other | Admitting: Family Medicine

## 2013-09-23 VITALS — BP 130/82 | HR 88 | Temp 97.8°F | Resp 17

## 2013-09-23 DIAGNOSIS — I6350 Cerebral infarction due to unspecified occlusion or stenosis of unspecified cerebral artery: Secondary | ICD-10-CM | POA: Diagnosis not present

## 2013-09-23 DIAGNOSIS — R609 Edema, unspecified: Secondary | ICD-10-CM

## 2013-09-23 MED ORDER — FUROSEMIDE 20 MG PO TABS: ORAL_TABLET | ORAL | Status: DC

## 2013-09-23 NOTE — Patient Instructions (Signed)
Schedule a diabetes follow up in Oct/Nov Continue the Lasix for the swelling Elevate the leg when sitting Try not to pick or scratch- vasoline will help prevent the dryness and itching Call with any questions or concerns Happy [place] Day!!

## 2013-09-23 NOTE — Assessment & Plan Note (Signed)
Chronic problem for pt.  Swelling is actually better today than previous.  Pt needs refill on Lasix.  Encouraged her to elevate when sitting in wheelchair.  Discussed OTC compression hose as pt will not wear the prescription hose- 'they are too tight'.  Reviewed supportive care and red flags that should prompt return.  Pt expressed understanding and is in agreement w/ plan.

## 2013-09-23 NOTE — Progress Notes (Signed)
Pre visit review using our clinic review tool, if applicable. No additional management support is needed unless otherwise documented below in the visit note. 

## 2013-09-23 NOTE — Progress Notes (Signed)
   Subjective:    Patient ID: Christina Pittman, female    DOB: [birth year], 80 y.o.   MRN: [id number]  HPI Leg swelling- L leg, pt has hx of swelling.  Pt's LE wound is well healed but 'it itches'.  Denies SOB.  No leg pain w/ exception of knee arthritis.  No CP.  Pt is still not ambulatory- sits in wheelchair all day.   Review of Systems For ROS see HPI     Objective:   Physical Exam  Vitals reviewed. Constitutional: She is oriented to person, place, and time. She appears well-developed and well-nourished. No distress.  Cardiovascular: Normal rate and intact distal pulses.   Murmur (II-III/VI SEM) heard. Pulmonary/Chest: Effort normal and breath sounds normal. No respiratory distress. She has no wheezes. She has no rales.  Musculoskeletal: She exhibits edema (1+ pitting edema of L LE).  Neurological: She is alert and oriented to person, place, and time.  Skin: Skin is warm and dry. No erythema.          Assessment & Plan:

## 2013-10-07 NOTE — Telephone Encounter (Signed)
Completed.

## 2013-10-07 NOTE — Telephone Encounter (Signed)
Orders signed and faxed back.

## 2013-10-28 DIAGNOSIS — S80812A Abrasion, left lower leg, initial encounter: Secondary | ICD-10-CM | POA: Diagnosis not present

## 2013-10-28 DIAGNOSIS — I739 Peripheral vascular disease, unspecified: Secondary | ICD-10-CM | POA: Diagnosis not present

## 2013-10-28 DIAGNOSIS — L509 Urticaria, unspecified: Secondary | ICD-10-CM | POA: Diagnosis not present

## 2013-10-29 ENCOUNTER — Ambulatory Visit (INDEPENDENT_AMBULATORY_CARE_PROVIDER_SITE_OTHER): Payer: Medicare Other | Admitting: Family

## 2013-10-29 ENCOUNTER — Encounter: Payer: Self-pay | Admitting: Family

## 2013-10-29 VITALS — BP 128/80 | HR 61 | Temp 98.0°F | Resp 16 | Ht 62.0 in

## 2013-10-29 DIAGNOSIS — S81812A Laceration without foreign body, left lower leg, initial encounter: Secondary | ICD-10-CM | POA: Insufficient documentation

## 2013-10-29 DIAGNOSIS — I639 Cerebral infarction, unspecified: Secondary | ICD-10-CM

## 2013-10-29 DIAGNOSIS — S81802A Unspecified open wound, left lower leg, initial encounter: Secondary | ICD-10-CM

## 2013-10-29 NOTE — Assessment & Plan Note (Signed)
Redressed with band-aid. Keep clean with soap and water. Apply zinc oxide to help decrease moisture around wound. Use prescribed compression hose or ace wrap wrapped from toe to knee. Keep leg elevated as patient will allow. Discussed basic wound care and signs of infection. Instructed to follow up if no improvement noted or if signs of infection develop.

## 2013-10-29 NOTE — Progress Notes (Signed)
   Subjective:    Patient ID: Christina Pittman, female    DOB: August 29, 1919, 80 y.o.   MRN: [id number]  HPI  [name] is a pleasant 80 y/o female who presents today for an acute visit with her daughter and neighbor present. Both contribute to this history. Indicates that she bruised her leg on her bed a couple of weeks ago and has a small open wound on the back of her left calf. Has been treating it with bandaids and antibiotic ointment. Denies the use of cold. Denies fevers, chills, excessive warmth to the area or infectious drainage.   Has previous history of difficult to heal wounds secondary to venous insufficiency and type 2 diabetes.    Past Medical History  Diagnosis Date  . Arthritis   . Diabetes mellitus   . Hypertension   . Heart murmur   . Chicken pox   . Fractured pelvis 05/14/2011  . Stroke     feb 2013  . Pituitary macroadenoma   . Insomnia   . Hyperlipemia   . Hemiparesis, right   . Dysphagia   . Bilateral venous insufficiency   . Aspiration pneumonia   . Aneurysm of thoracic aorta   . Gangrene of foot     History   Social History  . Marital Status: Widowed    Spouse Name: N/A    Number of Children: N/A  . Years of Education: N/A   Occupational History  . Not on file.   Social History Main Topics  . Smoking status: Never Smoker   . Smokeless tobacco: Not on file  . Alcohol Use: No  . Drug Use: No  . Sexual Activity:    Other Topics Concern  . Not on file   Social History Narrative  . No narrative on file    Past Surgical History  Procedure Laterality Date  . Abdominal hysterectomy    . Above knee leg amputation      Family History  Problem Relation Age of Onset  . Arthritis Mother   . Diabetes Sister   . Heart disease Brother   . Diabetes Brother     Allergies  Allergen Reactions  . Betadine [Povidone Iodine] Hives    Because of a history of documented adverse serious drug reaction;Medi Alert bracelet  is recommended  . Equalyte Hives      Medi Alert bracelet  is recommended if verified . Drug not found in reference texts ([facility] or [facility])  . Neosporin [Neomycin-Polymyxin-Gramicidin] Hives    Medi Alert bracelet  is recommended  . Codeine Other (See Comments)    Intolerance      Review of Systems    See HPI   Objective:   Physical Exam BP 128/80  Pulse 61  Temp(Src) 98 F (36.7 C) (Oral)  Resp 16  Ht 5\' 2"  (1.575 m)  SpO2 96% Nursing notes and vital signs reviewed.  Constitutional: Elderly female with R AKA seated in wheelchair in NAD. Appears stated age. Dressed appropriately. Speech is clear and answers questions appropriately.  Cardiovascular: Regular rate and rhythm, S1 and S2 appropriate, no murmurs, gallops or clicks; pulses are 2+ throughout, 2-3+ edema note left lower extremity without errythemia.  Respiratory: Regular, easy, symmetrical and adequate. No adventitious sounds present.  Skin: Posterior proximal 1/3 of calf about nickel sized skin tear covered by band-aids. Black/blue color around the area with small amount of serous discharge.      Assessment & Plan:

## 2013-10-29 NOTE — Patient Instructions (Signed)
Continue to keep the wound clean with soap and water.  You may alternate between the neosporin and the zinc oxide. Keep leg elevated as much as possible to help with edema. Already prescribed compression hose. May use ace wrap to help decrease edema placing it from toe-knee. Please follow up if the condition has worsened or fails to improve.

## 2013-10-29 NOTE — Progress Notes (Signed)
Pre visit review using our clinic review tool, if applicable. No additional management support is needed unless otherwise documented below in the visit note. 

## 2013-12-01 ENCOUNTER — Telehealth: Payer: Self-pay | Admitting: Family Medicine

## 2013-12-01 DIAGNOSIS — L97921 Non-pressure chronic ulcer of unspecified part of left lower leg limited to breakdown of skin: Secondary | ICD-10-CM

## 2013-12-01 NOTE — Telephone Encounter (Signed)
Called all numbers for pt only found one that was able to leave a voicemail on. Left a message that pt needs to call back to office and make an appt with Tabori. Needs a 30 minute slot (can use Hosp follow up)

## 2013-12-01 NOTE — Telephone Encounter (Signed)
[facility] MD called to notify of shallow, noninfected 5 cm ulcer on L leg posteriorly w/ excessive LE edema w/ bullous formation.  He indicated that pt does not require abx at this time but will need wound care and f/u.  Will enter outpt wound referral but pt will need appt in order to fulfill face-to-face requirement for home health.  Please call pt to schedule.

## 2013-12-01 NOTE — Telephone Encounter (Signed)
It is very important for her to be seen to avoid infection and risk of losing remaining leg

## 2013-12-01 NOTE — Telephone Encounter (Signed)
Daughter called and stated pt does not want to come right now and she will call next week to schedule if she can talk her mother into it.

## 2013-12-02 ENCOUNTER — Encounter: Payer: Self-pay | Admitting: Family Medicine

## 2013-12-02 DIAGNOSIS — S81812A Laceration without foreign body, left lower leg, initial encounter: Secondary | ICD-10-CM | POA: Diagnosis not present

## 2013-12-02 NOTE — Telephone Encounter (Signed)
Called and spoke with daughter and advised. They state they will talk to mom and call back if she will come in.

## 2013-12-07 DIAGNOSIS — L97921 Non-pressure chronic ulcer of unspecified part of left lower leg limited to breakdown of skin: Secondary | ICD-10-CM | POA: Diagnosis not present

## 2013-12-07 DIAGNOSIS — S81802D Unspecified open wound, left lower leg, subsequent encounter: Secondary | ICD-10-CM | POA: Diagnosis not present

## 2013-12-09 DIAGNOSIS — D500 Iron deficiency anemia secondary to blood loss (chronic): Secondary | ICD-10-CM | POA: Diagnosis not present

## 2013-12-09 DIAGNOSIS — K922 Gastrointestinal hemorrhage, unspecified: Secondary | ICD-10-CM | POA: Diagnosis not present

## 2013-12-09 DIAGNOSIS — N179 Acute kidney failure, unspecified: Secondary | ICD-10-CM | POA: Diagnosis not present

## 2013-12-09 DIAGNOSIS — I10 Essential (primary) hypertension: Secondary | ICD-10-CM | POA: Diagnosis present

## 2013-12-09 DIAGNOSIS — K449 Diaphragmatic hernia without obstruction or gangrene: Secondary | ICD-10-CM | POA: Diagnosis not present

## 2013-12-09 DIAGNOSIS — K920 Hematemesis: Secondary | ICD-10-CM | POA: Diagnosis not present

## 2013-12-09 DIAGNOSIS — I517 Cardiomegaly: Secondary | ICD-10-CM | POA: Diagnosis not present

## 2013-12-09 DIAGNOSIS — K250 Acute gastric ulcer with hemorrhage: Secondary | ICD-10-CM | POA: Diagnosis not present

## 2013-12-09 DIAGNOSIS — K226 Gastro-esophageal laceration-hemorrhage syndrome: Secondary | ICD-10-CM | POA: Diagnosis present

## 2013-12-09 DIAGNOSIS — D62 Acute posthemorrhagic anemia: Secondary | ICD-10-CM | POA: Diagnosis not present

## 2013-12-09 DIAGNOSIS — R1084 Generalized abdominal pain: Secondary | ICD-10-CM | POA: Diagnosis not present

## 2013-12-09 DIAGNOSIS — R1110 Vomiting, unspecified: Secondary | ICD-10-CM | POA: Diagnosis not present

## 2013-12-09 DIAGNOSIS — K256 Chronic or unspecified gastric ulcer with both hemorrhage and perforation: Secondary | ICD-10-CM | POA: Diagnosis not present

## 2013-12-09 DIAGNOSIS — K259 Gastric ulcer, unspecified as acute or chronic, without hemorrhage or perforation: Secondary | ICD-10-CM | POA: Diagnosis not present

## 2013-12-09 DIAGNOSIS — Z8673 Personal history of transient ischemic attack (TIA), and cerebral infarction without residual deficits: Secondary | ICD-10-CM | POA: Diagnosis not present

## 2013-12-09 DIAGNOSIS — I2510 Atherosclerotic heart disease of native coronary artery without angina pectoris: Secondary | ICD-10-CM | POA: Diagnosis present

## 2013-12-09 DIAGNOSIS — R109 Unspecified abdominal pain: Secondary | ICD-10-CM | POA: Diagnosis not present

## 2013-12-09 DIAGNOSIS — I700 Atherosclerosis of aorta: Secondary | ICD-10-CM | POA: Diagnosis not present

## 2013-12-09 DIAGNOSIS — Z89611 Acquired absence of right leg above knee: Secondary | ICD-10-CM | POA: Diagnosis not present

## 2013-12-09 DIAGNOSIS — I509 Heart failure, unspecified: Secondary | ICD-10-CM | POA: Diagnosis not present

## 2013-12-09 DIAGNOSIS — I739 Peripheral vascular disease, unspecified: Secondary | ICD-10-CM | POA: Diagnosis not present

## 2013-12-10 ENCOUNTER — Telehealth: Payer: Self-pay | Admitting: Family Medicine

## 2013-12-10 NOTE — Telephone Encounter (Signed)
Caller name: tracy--[facility] Relation to pt: Call back number: [phone] Pharmacy:  Reason for call:   Olivia Mackie requesting if its okay to see patient for physical therapy

## 2013-12-10 NOTE — Telephone Encounter (Signed)
Notified ok that she attend pt as long as she can tolerate it.

## 2013-12-13 ENCOUNTER — Telehealth: Payer: Self-pay

## 2013-12-13 NOTE — Telephone Encounter (Signed)
[facility] Admit date:  12/09/13 Discharge date:  12/11/13  Reason for admission:  Acute gastrointestinal bleed and acute renal failure  Discharge Instructions:  "The patient is to follow with PCP as scheduled, needs to have a CBC done on 12/16/2013 with her PCP, follow with Dr. Dorrene German for outpatient followup of repeat EGD. "   Information below provided by patient's daughter, Nestor Lewandowsky  Transition Care Management Follow-up Telephone Call  How have you been since you were released from the hospital?  Daughter states that patient is better.  No signs bleed.  Mother does have a lack of appetite.  She is not eating much.  Family is encouraging her to eat.  Daughter states that she has also been a little depressed today.  Daughter thinks mom may be feeling sorry for herself.    Do you understand why you were in the hospital? yes   Do you understand the discharge instructions? yes  Items Reviewed:  Medications reviewed: yes; New medication-Protonix. Pt is taking as prescribed.    Allergies reviewed: yes  Dietary changes reviewed: yes  No greasy foods.    Referrals reviewed: yes   Functional Questionnaire:   Activities of Daily Living (ADLs):   She states they are independent in the following: feeding States they require assistance with the following: ambulation, bathing and hygiene, continence, grooming, toileting and dressing, and meds and meals preparation.    Any transportation issues/concerns?: no, daughter provides transportation.   Any patient concerns? no   Confirmed importance and date/time of follow-up visits scheduled: yes   Confirmed with patient if condition begins to worsen call PCP or go to the ER.  Patient was given the Call-a-Nurse line [phone]: yes  Hospital follow up appointment scheduled for 12/16/13 @ 11 am with Dr. Birdie Riddle.

## 2013-12-14 DIAGNOSIS — E119 Type 2 diabetes mellitus without complications: Secondary | ICD-10-CM | POA: Diagnosis not present

## 2013-12-14 DIAGNOSIS — I739 Peripheral vascular disease, unspecified: Secondary | ICD-10-CM | POA: Diagnosis not present

## 2013-12-14 DIAGNOSIS — S81802D Unspecified open wound, left lower leg, subsequent encounter: Secondary | ICD-10-CM | POA: Diagnosis not present

## 2013-12-14 DIAGNOSIS — I2510 Atherosclerotic heart disease of native coronary artery without angina pectoris: Secondary | ICD-10-CM | POA: Diagnosis not present

## 2013-12-14 DIAGNOSIS — I509 Heart failure, unspecified: Secondary | ICD-10-CM | POA: Diagnosis not present

## 2013-12-14 DIAGNOSIS — Z89611 Acquired absence of right leg above knee: Secondary | ICD-10-CM | POA: Diagnosis not present

## 2013-12-14 DIAGNOSIS — I4891 Unspecified atrial fibrillation: Secondary | ICD-10-CM | POA: Diagnosis not present

## 2013-12-15 ENCOUNTER — Telehealth: Payer: Self-pay | Admitting: Family Medicine

## 2013-12-15 NOTE — Telephone Encounter (Signed)
Caller name: [facility] Relation to pt: Call back number: [phone] Pharmacy:  Reason for call:   Needs orders for wound care. Patient has ulcer and wounds on legs.

## 2013-12-15 NOTE — Telephone Encounter (Signed)
Called and lmovm giving a verbal ok.

## 2013-12-16 ENCOUNTER — Ambulatory Visit (INDEPENDENT_AMBULATORY_CARE_PROVIDER_SITE_OTHER): Payer: Medicare Other | Admitting: Family Medicine

## 2013-12-16 ENCOUNTER — Encounter (HOSPITAL_BASED_OUTPATIENT_CLINIC_OR_DEPARTMENT_OTHER): Payer: Medicare Other | Attending: Internal Medicine

## 2013-12-16 ENCOUNTER — Encounter: Payer: Self-pay | Admitting: Family Medicine

## 2013-12-16 ENCOUNTER — Encounter: Payer: Self-pay | Admitting: General Practice

## 2013-12-16 VITALS — BP 130/84 | HR 85 | Resp 17

## 2013-12-16 DIAGNOSIS — N289 Disorder of kidney and ureter, unspecified: Secondary | ICD-10-CM | POA: Diagnosis not present

## 2013-12-16 DIAGNOSIS — N179 Acute kidney failure, unspecified: Secondary | ICD-10-CM | POA: Diagnosis not present

## 2013-12-16 DIAGNOSIS — F17220 Nicotine dependence, chewing tobacco, uncomplicated: Secondary | ICD-10-CM | POA: Diagnosis not present

## 2013-12-16 DIAGNOSIS — K922 Gastrointestinal hemorrhage, unspecified: Secondary | ICD-10-CM | POA: Insufficient documentation

## 2013-12-16 DIAGNOSIS — Z87820 Personal history of traumatic brain injury: Secondary | ICD-10-CM | POA: Diagnosis not present

## 2013-12-16 DIAGNOSIS — K254 Chronic or unspecified gastric ulcer with hemorrhage: Secondary | ICD-10-CM

## 2013-12-16 DIAGNOSIS — S81802A Unspecified open wound, left lower leg, initial encounter: Secondary | ICD-10-CM

## 2013-12-16 DIAGNOSIS — I872 Venous insufficiency (chronic) (peripheral): Secondary | ICD-10-CM | POA: Diagnosis not present

## 2013-12-16 DIAGNOSIS — I712 Thoracic aortic aneurysm, without rupture: Secondary | ICD-10-CM | POA: Diagnosis not present

## 2013-12-16 DIAGNOSIS — I509 Heart failure, unspecified: Secondary | ICD-10-CM | POA: Diagnosis not present

## 2013-12-16 DIAGNOSIS — L97221 Non-pressure chronic ulcer of left calf limited to breakdown of skin: Secondary | ICD-10-CM | POA: Diagnosis not present

## 2013-12-16 DIAGNOSIS — I8390 Asymptomatic varicose veins of unspecified lower extremity: Secondary | ICD-10-CM | POA: Diagnosis not present

## 2013-12-16 DIAGNOSIS — T1490 Injury, unspecified: Secondary | ICD-10-CM | POA: Diagnosis not present

## 2013-12-16 DIAGNOSIS — I890 Lymphedema, not elsewhere classified: Secondary | ICD-10-CM | POA: Diagnosis not present

## 2013-12-16 DIAGNOSIS — D352 Benign neoplasm of pituitary gland: Secondary | ICD-10-CM | POA: Diagnosis not present

## 2013-12-16 DIAGNOSIS — I10 Essential (primary) hypertension: Secondary | ICD-10-CM | POA: Diagnosis not present

## 2013-12-16 LAB — BASIC METABOLIC PANEL
BUN: 14 mg/dL (ref 6–23)
CO2: 25 mEq/L (ref 19–32)
CREATININE: 1.2 mg/dL (ref 0.4–1.2)
Calcium: 8.6 mg/dL (ref 8.4–10.5)
Chloride: 110 mEq/L (ref 96–112)
GFR: 43.76 mL/min — AB (ref >60.00)
Glucose, Bld: 117 mg/dL — ABNORMAL HIGH (ref 70–99)
POTASSIUM: 4 meq/L (ref 3.5–5.1)
Sodium: 141 mEq/L (ref 135–145)

## 2013-12-16 LAB — CBC WITH DIFFERENTIAL/PLATELET
Basophils Absolute: 0 10*3/uL (ref 0.0–0.1)
Basophils Relative: 0.4 % (ref 0.0–3.0)
EOS PCT: 6 % — AB (ref 0.0–5.0)
Eosinophils Absolute: 0.6 10*3/uL (ref 0.0–0.7)
HEMATOCRIT: 28.4 % — AB (ref 36.0–46.0)
Hemoglobin: 9.3 g/dL — ABNORMAL LOW (ref 12.0–15.0)
Lymphocytes Relative: 24.7 % (ref 12.0–46.0)
Lymphs Abs: 2.3 10*3/uL (ref 0.7–4.0)
MCHC: 32.9 g/dL (ref 30.0–36.0)
MCV: 92.4 fl (ref 78.0–100.0)
MONOS PCT: 8.9 % (ref 3.0–12.0)
Monocytes Absolute: 0.8 10*3/uL (ref 0.1–1.0)
NEUTROS PCT: 60 % (ref 43.0–77.0)
Neutro Abs: 5.7 10*3/uL (ref 1.4–7.7)
Platelets: 264 10*3/uL (ref 150.0–400.0)
RBC: 3.07 Mil/uL — ABNORMAL LOW (ref 3.87–5.11)
RDW: 13.7 % (ref 11.5–15.5)
WBC: 9.5 10*3/uL (ref 4.0–10.5)

## 2013-12-16 MED ORDER — CEPHALEXIN 500 MG PO CAPS: 500.0000 mg | ORAL_CAPSULE | Freq: Two times a day (BID) | ORAL | Status: DC

## 2013-12-16 NOTE — Progress Notes (Signed)
Pre visit review using our clinic review tool, if applicable. No additional management support is needed unless otherwise documented below in the visit note. 

## 2013-12-16 NOTE — Progress Notes (Signed)
   Subjective:    Patient ID: Christina Pittman, female    DOB: May 14, 1919, 80 y.o.   MRN: [id number]  [facility] f/u-  Pt was admitted on 11/12 for GI bleed.  D/c'd on 11/14.  Pt was found to have multiple ulcers on EGD and a Mallory Weiss tear.  Now on Protonix BID.  Pt was advised not to take any NSAIDs.  Pt had elevated Cr at time of admission, lasix was changed to every other day.  Prior to hospitalization pt was seen at [facility] for a leg wound on L lower leg and stitches were placed.  Pt and family cannot tell me what day they went to [facility] or how long stitches have been present.  Daughter reports there is now pus and drainage from wound.   Review of Systems For ROS see HPI     Objective:   Physical Exam  Constitutional: She appears well-developed and well-nourished. No distress.  Sitting in wheelchair  HENT:  Head: Normocephalic and atraumatic.  Cardiovascular: Normal rate, regular rhythm, normal heart sounds and intact distal pulses.   Pulmonary/Chest: Effort normal and breath sounds normal. No respiratory distress. She has no wheezes. She has no rales.  Abdominal: Soft. Bowel sounds are normal. She exhibits no distension. There is no tenderness. There is no rebound.  Musculoskeletal: She exhibits edema (trace-1+ edema of LLE).  Skin: Skin is warm and dry.  Large, irregularly shaped wound on L lateral lower leg w/ 4 sutures in place (not approximating edges or holding skin together) w/ purulent exudate covering wound base and surrounding erythema  Vitals reviewed.         Assessment & Plan:

## 2013-12-16 NOTE — Patient Instructions (Signed)
We'll determine your follow up based your lab results We'll call you with your wound care appt Start the Keflex twice daily x7 days for leg infection Call with any questions or concerns HAPPY THANKSGIVING!!!

## 2013-12-17 ENCOUNTER — Telehealth: Payer: Self-pay | Admitting: Family Medicine

## 2013-12-17 ENCOUNTER — Other Ambulatory Visit: Payer: Self-pay | Admitting: General Practice

## 2013-12-17 DIAGNOSIS — I509 Heart failure, unspecified: Secondary | ICD-10-CM | POA: Diagnosis not present

## 2013-12-17 DIAGNOSIS — E119 Type 2 diabetes mellitus without complications: Secondary | ICD-10-CM | POA: Diagnosis not present

## 2013-12-17 DIAGNOSIS — S81802D Unspecified open wound, left lower leg, subsequent encounter: Secondary | ICD-10-CM | POA: Diagnosis not present

## 2013-12-17 DIAGNOSIS — I2510 Atherosclerotic heart disease of native coronary artery without angina pectoris: Secondary | ICD-10-CM | POA: Diagnosis not present

## 2013-12-17 DIAGNOSIS — I4891 Unspecified atrial fibrillation: Secondary | ICD-10-CM | POA: Diagnosis not present

## 2013-12-17 DIAGNOSIS — I739 Peripheral vascular disease, unspecified: Secondary | ICD-10-CM | POA: Diagnosis not present

## 2013-12-17 MED ORDER — CARVEDILOL 25 MG PO TABS: 25.0000 mg | ORAL_TABLET | Freq: Two times a day (BID) | ORAL | Status: DC

## 2013-12-17 NOTE — Telephone Encounter (Signed)
Caller name: radovan--occupational therapist at [facility] Relation to pt: Call back number: [phone] Pharmacy:  Reason for call:   Needs order to continue OT orders.

## 2013-12-17 NOTE — Telephone Encounter (Signed)
Verbal ok given to continue therapy.  

## 2013-12-18 DIAGNOSIS — I739 Peripheral vascular disease, unspecified: Secondary | ICD-10-CM | POA: Diagnosis not present

## 2013-12-18 DIAGNOSIS — E119 Type 2 diabetes mellitus without complications: Secondary | ICD-10-CM | POA: Diagnosis not present

## 2013-12-18 DIAGNOSIS — I509 Heart failure, unspecified: Secondary | ICD-10-CM | POA: Diagnosis not present

## 2013-12-18 DIAGNOSIS — I4891 Unspecified atrial fibrillation: Secondary | ICD-10-CM | POA: Diagnosis not present

## 2013-12-18 DIAGNOSIS — S81802D Unspecified open wound, left lower leg, subsequent encounter: Secondary | ICD-10-CM | POA: Diagnosis not present

## 2013-12-18 DIAGNOSIS — I2510 Atherosclerotic heart disease of native coronary artery without angina pectoris: Secondary | ICD-10-CM | POA: Diagnosis not present

## 2013-12-19 NOTE — Assessment & Plan Note (Signed)
New.  Noted during recent hospitalization for GI bleed at [facility].  No Cr value sent w/ d/c summary.  Get BMP to assess compared to previous readings.  Continue Lasix every other day (although pt doesn't usually take this often b/c of the need to urinate and her difficulty getting to bathroom)

## 2013-12-19 NOTE — Assessment & Plan Note (Signed)
Pt w/ yet another L leg wound although this one now w/ obvious purulent d/c.  4 sutures removed from lower leg- no idea how long they've been present but they are not approximating wound edges.  Attempted to clean wound, dressing placed, and stat referral to wound care made.  Start oral abx.  Reviewed supportive care and red flags that should prompt return.  Pt expressed understanding and is in agreement w/ plan.

## 2013-12-19 NOTE — Assessment & Plan Note (Signed)
New.  Pt recently hospitalized for this at [facility].  No evidence of current bleed.  Now on Protonix BID.  Reviewed NSAID avoidance.  Check CBC to ensure Hgb isn't dropping (although no value provided in d/c summary)

## 2013-12-20 DIAGNOSIS — E119 Type 2 diabetes mellitus without complications: Secondary | ICD-10-CM | POA: Diagnosis not present

## 2013-12-20 DIAGNOSIS — I2510 Atherosclerotic heart disease of native coronary artery without angina pectoris: Secondary | ICD-10-CM | POA: Diagnosis not present

## 2013-12-20 DIAGNOSIS — S81802D Unspecified open wound, left lower leg, subsequent encounter: Secondary | ICD-10-CM | POA: Diagnosis not present

## 2013-12-20 DIAGNOSIS — I4891 Unspecified atrial fibrillation: Secondary | ICD-10-CM | POA: Diagnosis not present

## 2013-12-20 DIAGNOSIS — I509 Heart failure, unspecified: Secondary | ICD-10-CM | POA: Diagnosis not present

## 2013-12-20 DIAGNOSIS — I739 Peripheral vascular disease, unspecified: Secondary | ICD-10-CM | POA: Diagnosis not present

## 2013-12-21 DIAGNOSIS — I739 Peripheral vascular disease, unspecified: Secondary | ICD-10-CM | POA: Diagnosis not present

## 2013-12-21 DIAGNOSIS — I2510 Atherosclerotic heart disease of native coronary artery without angina pectoris: Secondary | ICD-10-CM | POA: Diagnosis not present

## 2013-12-21 DIAGNOSIS — I509 Heart failure, unspecified: Secondary | ICD-10-CM | POA: Diagnosis not present

## 2013-12-21 DIAGNOSIS — E119 Type 2 diabetes mellitus without complications: Secondary | ICD-10-CM | POA: Diagnosis not present

## 2013-12-21 DIAGNOSIS — S81802D Unspecified open wound, left lower leg, subsequent encounter: Secondary | ICD-10-CM | POA: Diagnosis not present

## 2013-12-21 DIAGNOSIS — I4891 Unspecified atrial fibrillation: Secondary | ICD-10-CM | POA: Diagnosis not present

## 2013-12-22 ENCOUNTER — Telehealth: Payer: Self-pay | Admitting: *Deleted

## 2013-12-22 NOTE — Telephone Encounter (Signed)
Signed forms faxed to [facility] at [phone]. Fax confirmation received. JG//CMA

## 2013-12-22 NOTE — Telephone Encounter (Signed)
Received home health certification and plan of care via fax from [place]. Forms forwarded to Dr. Birdie Riddle. JG//CMA

## 2013-12-24 DIAGNOSIS — S81802D Unspecified open wound, left lower leg, subsequent encounter: Secondary | ICD-10-CM | POA: Diagnosis not present

## 2013-12-24 DIAGNOSIS — I2510 Atherosclerotic heart disease of native coronary artery without angina pectoris: Secondary | ICD-10-CM | POA: Diagnosis not present

## 2013-12-24 DIAGNOSIS — I4891 Unspecified atrial fibrillation: Secondary | ICD-10-CM | POA: Diagnosis not present

## 2013-12-24 DIAGNOSIS — E119 Type 2 diabetes mellitus without complications: Secondary | ICD-10-CM | POA: Diagnosis not present

## 2013-12-24 DIAGNOSIS — I739 Peripheral vascular disease, unspecified: Secondary | ICD-10-CM | POA: Diagnosis not present

## 2013-12-24 DIAGNOSIS — I509 Heart failure, unspecified: Secondary | ICD-10-CM | POA: Diagnosis not present

## 2013-12-27 DIAGNOSIS — I739 Peripheral vascular disease, unspecified: Secondary | ICD-10-CM | POA: Diagnosis not present

## 2013-12-27 DIAGNOSIS — I4891 Unspecified atrial fibrillation: Secondary | ICD-10-CM | POA: Diagnosis not present

## 2013-12-27 DIAGNOSIS — I2510 Atherosclerotic heart disease of native coronary artery without angina pectoris: Secondary | ICD-10-CM | POA: Diagnosis not present

## 2013-12-27 DIAGNOSIS — E119 Type 2 diabetes mellitus without complications: Secondary | ICD-10-CM | POA: Diagnosis not present

## 2013-12-27 DIAGNOSIS — I509 Heart failure, unspecified: Secondary | ICD-10-CM | POA: Diagnosis not present

## 2013-12-27 DIAGNOSIS — S81802D Unspecified open wound, left lower leg, subsequent encounter: Secondary | ICD-10-CM | POA: Diagnosis not present

## 2013-12-29 ENCOUNTER — Telehealth: Payer: Self-pay | Admitting: Family Medicine

## 2013-12-29 DIAGNOSIS — I739 Peripheral vascular disease, unspecified: Secondary | ICD-10-CM | POA: Diagnosis not present

## 2013-12-29 DIAGNOSIS — I4891 Unspecified atrial fibrillation: Secondary | ICD-10-CM | POA: Diagnosis not present

## 2013-12-29 DIAGNOSIS — I2510 Atherosclerotic heart disease of native coronary artery without angina pectoris: Secondary | ICD-10-CM | POA: Diagnosis not present

## 2013-12-29 DIAGNOSIS — S81802D Unspecified open wound, left lower leg, subsequent encounter: Secondary | ICD-10-CM | POA: Diagnosis not present

## 2013-12-29 DIAGNOSIS — I509 Heart failure, unspecified: Secondary | ICD-10-CM | POA: Diagnosis not present

## 2013-12-29 DIAGNOSIS — E119 Type 2 diabetes mellitus without complications: Secondary | ICD-10-CM | POA: Diagnosis not present

## 2013-12-29 MED ORDER — CEPHALEXIN 500 MG PO CAPS: 500.0000 mg | ORAL_CAPSULE | Freq: Two times a day (BID) | ORAL | Status: AC

## 2013-12-29 NOTE — Telephone Encounter (Signed)
PATIENT FEELS SHE NEEDS ANOTHER ROUND OF CETHALEXIN 500 MG    LEG IS A LITTLE BETTER BUT IT IS NOT WELL.  NURSE SAYS THE SORE ON THE BACK OF THE LEG IS LEAKING.  [facility] ON [address]

## 2013-12-29 NOTE — Telephone Encounter (Signed)
Med filled. Spoke with pt granddaughter. States that she was suppose to have had an appt with wound care today but it was cancelled due to pt not wanting to get out in the weather. Family was notified that that appt needed to be rescheduled due to the condition of the wound. Family stated an understanding and advised they were calling after we hung up to see if they could get her in tomorrow if not today.

## 2013-12-29 NOTE — Telephone Encounter (Signed)
Pt can have refill on abx but also needs to have evaluation of wound.  Is she going to wound care?

## 2013-12-31 DIAGNOSIS — I872 Venous insufficiency (chronic) (peripheral): Secondary | ICD-10-CM | POA: Diagnosis not present

## 2013-12-31 DIAGNOSIS — I10 Essential (primary) hypertension: Secondary | ICD-10-CM | POA: Diagnosis not present

## 2013-12-31 DIAGNOSIS — I890 Lymphedema, not elsewhere classified: Secondary | ICD-10-CM | POA: Diagnosis not present

## 2013-12-31 DIAGNOSIS — I4891 Unspecified atrial fibrillation: Secondary | ICD-10-CM | POA: Diagnosis not present

## 2013-12-31 DIAGNOSIS — T1490 Injury, unspecified: Secondary | ICD-10-CM | POA: Diagnosis not present

## 2013-12-31 DIAGNOSIS — D352 Benign neoplasm of pituitary gland: Secondary | ICD-10-CM | POA: Diagnosis not present

## 2013-12-31 DIAGNOSIS — N179 Acute kidney failure, unspecified: Secondary | ICD-10-CM | POA: Diagnosis not present

## 2013-12-31 DIAGNOSIS — I2510 Atherosclerotic heart disease of native coronary artery without angina pectoris: Secondary | ICD-10-CM | POA: Diagnosis not present

## 2013-12-31 DIAGNOSIS — I509 Heart failure, unspecified: Secondary | ICD-10-CM | POA: Diagnosis not present

## 2013-12-31 DIAGNOSIS — I739 Peripheral vascular disease, unspecified: Secondary | ICD-10-CM | POA: Diagnosis not present

## 2013-12-31 DIAGNOSIS — E119 Type 2 diabetes mellitus without complications: Secondary | ICD-10-CM | POA: Diagnosis not present

## 2013-12-31 DIAGNOSIS — I712 Thoracic aortic aneurysm, without rupture: Secondary | ICD-10-CM | POA: Diagnosis not present

## 2013-12-31 DIAGNOSIS — Z87820 Personal history of traumatic brain injury: Secondary | ICD-10-CM | POA: Diagnosis not present

## 2013-12-31 DIAGNOSIS — I8390 Asymptomatic varicose veins of unspecified lower extremity: Secondary | ICD-10-CM | POA: Diagnosis not present

## 2013-12-31 DIAGNOSIS — F17220 Nicotine dependence, chewing tobacco, uncomplicated: Secondary | ICD-10-CM | POA: Diagnosis not present

## 2013-12-31 DIAGNOSIS — L97221 Non-pressure chronic ulcer of left calf limited to breakdown of skin: Secondary | ICD-10-CM | POA: Diagnosis not present

## 2013-12-31 DIAGNOSIS — S81802D Unspecified open wound, left lower leg, subsequent encounter: Secondary | ICD-10-CM | POA: Diagnosis not present

## 2014-01-03 DIAGNOSIS — I4891 Unspecified atrial fibrillation: Secondary | ICD-10-CM | POA: Diagnosis not present

## 2014-01-03 DIAGNOSIS — S81802D Unspecified open wound, left lower leg, subsequent encounter: Secondary | ICD-10-CM | POA: Diagnosis not present

## 2014-01-03 DIAGNOSIS — I2510 Atherosclerotic heart disease of native coronary artery without angina pectoris: Secondary | ICD-10-CM | POA: Diagnosis not present

## 2014-01-03 DIAGNOSIS — E119 Type 2 diabetes mellitus without complications: Secondary | ICD-10-CM | POA: Diagnosis not present

## 2014-01-03 DIAGNOSIS — I739 Peripheral vascular disease, unspecified: Secondary | ICD-10-CM | POA: Diagnosis not present

## 2014-01-03 DIAGNOSIS — I509 Heart failure, unspecified: Secondary | ICD-10-CM | POA: Diagnosis not present

## 2014-01-04 DIAGNOSIS — I2510 Atherosclerotic heart disease of native coronary artery without angina pectoris: Secondary | ICD-10-CM | POA: Diagnosis not present

## 2014-01-04 DIAGNOSIS — S81802D Unspecified open wound, left lower leg, subsequent encounter: Secondary | ICD-10-CM | POA: Diagnosis not present

## 2014-01-04 DIAGNOSIS — I739 Peripheral vascular disease, unspecified: Secondary | ICD-10-CM | POA: Diagnosis not present

## 2014-01-04 DIAGNOSIS — I509 Heart failure, unspecified: Secondary | ICD-10-CM | POA: Diagnosis not present

## 2014-01-04 DIAGNOSIS — I4891 Unspecified atrial fibrillation: Secondary | ICD-10-CM | POA: Diagnosis not present

## 2014-01-04 DIAGNOSIS — E119 Type 2 diabetes mellitus without complications: Secondary | ICD-10-CM | POA: Diagnosis not present

## 2014-01-07 DIAGNOSIS — I739 Peripheral vascular disease, unspecified: Secondary | ICD-10-CM | POA: Diagnosis not present

## 2014-01-07 DIAGNOSIS — E119 Type 2 diabetes mellitus without complications: Secondary | ICD-10-CM | POA: Diagnosis not present

## 2014-01-07 DIAGNOSIS — S81802D Unspecified open wound, left lower leg, subsequent encounter: Secondary | ICD-10-CM | POA: Diagnosis not present

## 2014-01-07 DIAGNOSIS — I2510 Atherosclerotic heart disease of native coronary artery without angina pectoris: Secondary | ICD-10-CM | POA: Diagnosis not present

## 2014-01-07 DIAGNOSIS — I509 Heart failure, unspecified: Secondary | ICD-10-CM | POA: Diagnosis not present

## 2014-01-07 DIAGNOSIS — I4891 Unspecified atrial fibrillation: Secondary | ICD-10-CM | POA: Diagnosis not present

## 2014-01-09 DIAGNOSIS — K254 Chronic or unspecified gastric ulcer with hemorrhage: Secondary | ICD-10-CM | POA: Diagnosis not present

## 2014-01-09 DIAGNOSIS — N289 Disorder of kidney and ureter, unspecified: Secondary | ICD-10-CM | POA: Diagnosis not present

## 2014-01-09 DIAGNOSIS — S81802A Unspecified open wound, left lower leg, initial encounter: Secondary | ICD-10-CM | POA: Diagnosis not present

## 2014-01-10 DIAGNOSIS — I509 Heart failure, unspecified: Secondary | ICD-10-CM | POA: Diagnosis not present

## 2014-01-10 DIAGNOSIS — I4891 Unspecified atrial fibrillation: Secondary | ICD-10-CM | POA: Diagnosis not present

## 2014-01-10 DIAGNOSIS — I739 Peripheral vascular disease, unspecified: Secondary | ICD-10-CM | POA: Diagnosis not present

## 2014-01-10 DIAGNOSIS — S81802D Unspecified open wound, left lower leg, subsequent encounter: Secondary | ICD-10-CM | POA: Diagnosis not present

## 2014-01-10 DIAGNOSIS — I2510 Atherosclerotic heart disease of native coronary artery without angina pectoris: Secondary | ICD-10-CM | POA: Diagnosis not present

## 2014-01-10 DIAGNOSIS — E119 Type 2 diabetes mellitus without complications: Secondary | ICD-10-CM | POA: Diagnosis not present

## 2014-01-11 DIAGNOSIS — I509 Heart failure, unspecified: Secondary | ICD-10-CM | POA: Diagnosis not present

## 2014-01-11 DIAGNOSIS — E119 Type 2 diabetes mellitus without complications: Secondary | ICD-10-CM | POA: Diagnosis not present

## 2014-01-11 DIAGNOSIS — I739 Peripheral vascular disease, unspecified: Secondary | ICD-10-CM | POA: Diagnosis not present

## 2014-01-11 DIAGNOSIS — S81802D Unspecified open wound, left lower leg, subsequent encounter: Secondary | ICD-10-CM | POA: Diagnosis not present

## 2014-01-11 DIAGNOSIS — I2510 Atherosclerotic heart disease of native coronary artery without angina pectoris: Secondary | ICD-10-CM | POA: Diagnosis not present

## 2014-01-11 DIAGNOSIS — I4891 Unspecified atrial fibrillation: Secondary | ICD-10-CM | POA: Diagnosis not present

## 2014-01-12 DIAGNOSIS — I2510 Atherosclerotic heart disease of native coronary artery without angina pectoris: Secondary | ICD-10-CM | POA: Diagnosis not present

## 2014-01-12 DIAGNOSIS — I4891 Unspecified atrial fibrillation: Secondary | ICD-10-CM | POA: Diagnosis not present

## 2014-01-12 DIAGNOSIS — I739 Peripheral vascular disease, unspecified: Secondary | ICD-10-CM | POA: Diagnosis not present

## 2014-01-12 DIAGNOSIS — E119 Type 2 diabetes mellitus without complications: Secondary | ICD-10-CM | POA: Diagnosis not present

## 2014-01-12 DIAGNOSIS — I509 Heart failure, unspecified: Secondary | ICD-10-CM | POA: Diagnosis not present

## 2014-01-12 DIAGNOSIS — S81802D Unspecified open wound, left lower leg, subsequent encounter: Secondary | ICD-10-CM | POA: Diagnosis not present

## 2014-01-14 DIAGNOSIS — I739 Peripheral vascular disease, unspecified: Secondary | ICD-10-CM | POA: Diagnosis not present

## 2014-01-14 DIAGNOSIS — I2510 Atherosclerotic heart disease of native coronary artery without angina pectoris: Secondary | ICD-10-CM | POA: Diagnosis not present

## 2014-01-14 DIAGNOSIS — S81802D Unspecified open wound, left lower leg, subsequent encounter: Secondary | ICD-10-CM | POA: Diagnosis not present

## 2014-01-14 DIAGNOSIS — E119 Type 2 diabetes mellitus without complications: Secondary | ICD-10-CM | POA: Diagnosis not present

## 2014-01-14 DIAGNOSIS — I509 Heart failure, unspecified: Secondary | ICD-10-CM | POA: Diagnosis not present

## 2014-01-14 DIAGNOSIS — I4891 Unspecified atrial fibrillation: Secondary | ICD-10-CM | POA: Diagnosis not present

## 2014-01-17 DIAGNOSIS — I4891 Unspecified atrial fibrillation: Secondary | ICD-10-CM | POA: Diagnosis not present

## 2014-01-17 DIAGNOSIS — S81802D Unspecified open wound, left lower leg, subsequent encounter: Secondary | ICD-10-CM | POA: Diagnosis not present

## 2014-01-17 DIAGNOSIS — E119 Type 2 diabetes mellitus without complications: Secondary | ICD-10-CM | POA: Diagnosis not present

## 2014-01-17 DIAGNOSIS — I739 Peripheral vascular disease, unspecified: Secondary | ICD-10-CM | POA: Diagnosis not present

## 2014-01-17 DIAGNOSIS — I509 Heart failure, unspecified: Secondary | ICD-10-CM | POA: Diagnosis not present

## 2014-01-17 DIAGNOSIS — I2510 Atherosclerotic heart disease of native coronary artery without angina pectoris: Secondary | ICD-10-CM | POA: Diagnosis not present

## 2014-01-18 DIAGNOSIS — I2510 Atherosclerotic heart disease of native coronary artery without angina pectoris: Secondary | ICD-10-CM | POA: Diagnosis not present

## 2014-01-18 DIAGNOSIS — I509 Heart failure, unspecified: Secondary | ICD-10-CM | POA: Diagnosis not present

## 2014-01-18 DIAGNOSIS — S81802D Unspecified open wound, left lower leg, subsequent encounter: Secondary | ICD-10-CM | POA: Diagnosis not present

## 2014-01-18 DIAGNOSIS — I4891 Unspecified atrial fibrillation: Secondary | ICD-10-CM | POA: Diagnosis not present

## 2014-01-18 DIAGNOSIS — I739 Peripheral vascular disease, unspecified: Secondary | ICD-10-CM | POA: Diagnosis not present

## 2014-01-18 DIAGNOSIS — E119 Type 2 diabetes mellitus without complications: Secondary | ICD-10-CM | POA: Diagnosis not present

## 2014-01-19 DIAGNOSIS — S81802D Unspecified open wound, left lower leg, subsequent encounter: Secondary | ICD-10-CM | POA: Diagnosis not present

## 2014-01-19 DIAGNOSIS — E119 Type 2 diabetes mellitus without complications: Secondary | ICD-10-CM | POA: Diagnosis not present

## 2014-01-19 DIAGNOSIS — I4891 Unspecified atrial fibrillation: Secondary | ICD-10-CM | POA: Diagnosis not present

## 2014-01-19 DIAGNOSIS — I739 Peripheral vascular disease, unspecified: Secondary | ICD-10-CM | POA: Diagnosis not present

## 2014-01-19 DIAGNOSIS — I2510 Atherosclerotic heart disease of native coronary artery without angina pectoris: Secondary | ICD-10-CM | POA: Diagnosis not present

## 2014-01-19 DIAGNOSIS — I509 Heart failure, unspecified: Secondary | ICD-10-CM | POA: Diagnosis not present

## 2014-01-24 DIAGNOSIS — I509 Heart failure, unspecified: Secondary | ICD-10-CM | POA: Diagnosis not present

## 2014-01-24 DIAGNOSIS — S81802D Unspecified open wound, left lower leg, subsequent encounter: Secondary | ICD-10-CM | POA: Diagnosis not present

## 2014-01-24 DIAGNOSIS — I4891 Unspecified atrial fibrillation: Secondary | ICD-10-CM | POA: Diagnosis not present

## 2014-01-24 DIAGNOSIS — E119 Type 2 diabetes mellitus without complications: Secondary | ICD-10-CM | POA: Diagnosis not present

## 2014-01-24 DIAGNOSIS — I739 Peripheral vascular disease, unspecified: Secondary | ICD-10-CM | POA: Diagnosis not present

## 2014-01-24 DIAGNOSIS — I2510 Atherosclerotic heart disease of native coronary artery without angina pectoris: Secondary | ICD-10-CM | POA: Diagnosis not present

## 2014-01-25 DIAGNOSIS — I2510 Atherosclerotic heart disease of native coronary artery without angina pectoris: Secondary | ICD-10-CM | POA: Diagnosis not present

## 2014-01-25 DIAGNOSIS — I509 Heart failure, unspecified: Secondary | ICD-10-CM | POA: Diagnosis not present

## 2014-01-25 DIAGNOSIS — I4891 Unspecified atrial fibrillation: Secondary | ICD-10-CM | POA: Diagnosis not present

## 2014-01-25 DIAGNOSIS — E119 Type 2 diabetes mellitus without complications: Secondary | ICD-10-CM | POA: Diagnosis not present

## 2014-01-25 DIAGNOSIS — I739 Peripheral vascular disease, unspecified: Secondary | ICD-10-CM | POA: Diagnosis not present

## 2014-01-25 DIAGNOSIS — S81802D Unspecified open wound, left lower leg, subsequent encounter: Secondary | ICD-10-CM | POA: Diagnosis not present

## 2014-01-26 ENCOUNTER — Telehealth: Payer: Self-pay | Admitting: Family Medicine

## 2014-01-26 DIAGNOSIS — I739 Peripheral vascular disease, unspecified: Secondary | ICD-10-CM | POA: Diagnosis not present

## 2014-01-26 DIAGNOSIS — S81802D Unspecified open wound, left lower leg, subsequent encounter: Secondary | ICD-10-CM | POA: Diagnosis not present

## 2014-01-26 DIAGNOSIS — I509 Heart failure, unspecified: Secondary | ICD-10-CM | POA: Diagnosis not present

## 2014-01-26 DIAGNOSIS — E119 Type 2 diabetes mellitus without complications: Secondary | ICD-10-CM | POA: Diagnosis not present

## 2014-01-26 DIAGNOSIS — I2510 Atherosclerotic heart disease of native coronary artery without angina pectoris: Secondary | ICD-10-CM | POA: Diagnosis not present

## 2014-01-26 DIAGNOSIS — I4891 Unspecified atrial fibrillation: Secondary | ICD-10-CM | POA: Diagnosis not present

## 2014-01-26 NOTE — Telephone Encounter (Signed)
Caller name: radoan OT at [facility] Relation to pt: Call back number: [phone] Pharmacy:  Reason for call:   Patient has been discharged from OT and has reached all of her goals

## 2014-01-26 NOTE — Telephone Encounter (Signed)
FYI

## 2014-02-01 DIAGNOSIS — E119 Type 2 diabetes mellitus without complications: Secondary | ICD-10-CM | POA: Diagnosis not present

## 2014-02-01 DIAGNOSIS — I2510 Atherosclerotic heart disease of native coronary artery without angina pectoris: Secondary | ICD-10-CM | POA: Diagnosis not present

## 2014-02-01 DIAGNOSIS — I509 Heart failure, unspecified: Secondary | ICD-10-CM | POA: Diagnosis not present

## 2014-02-01 DIAGNOSIS — I739 Peripheral vascular disease, unspecified: Secondary | ICD-10-CM | POA: Diagnosis not present

## 2014-02-01 DIAGNOSIS — I4891 Unspecified atrial fibrillation: Secondary | ICD-10-CM | POA: Diagnosis not present

## 2014-02-01 DIAGNOSIS — S81802D Unspecified open wound, left lower leg, subsequent encounter: Secondary | ICD-10-CM | POA: Diagnosis not present

## 2014-02-08 DIAGNOSIS — E119 Type 2 diabetes mellitus without complications: Secondary | ICD-10-CM | POA: Diagnosis not present

## 2014-02-08 DIAGNOSIS — S81802D Unspecified open wound, left lower leg, subsequent encounter: Secondary | ICD-10-CM | POA: Diagnosis not present

## 2014-02-08 DIAGNOSIS — I509 Heart failure, unspecified: Secondary | ICD-10-CM | POA: Diagnosis not present

## 2014-02-08 DIAGNOSIS — I4891 Unspecified atrial fibrillation: Secondary | ICD-10-CM | POA: Diagnosis not present

## 2014-02-08 DIAGNOSIS — I2510 Atherosclerotic heart disease of native coronary artery without angina pectoris: Secondary | ICD-10-CM | POA: Diagnosis not present

## 2014-02-08 DIAGNOSIS — I739 Peripheral vascular disease, unspecified: Secondary | ICD-10-CM | POA: Diagnosis not present

## 2014-03-02 ENCOUNTER — Other Ambulatory Visit: Payer: Self-pay | Admitting: Family Medicine

## 2014-03-03 NOTE — Telephone Encounter (Signed)
Med filled.  

## 2014-05-09 ENCOUNTER — Other Ambulatory Visit: Payer: Self-pay | Admitting: Family Medicine

## 2014-05-10 NOTE — Telephone Encounter (Signed)
Med filled.  

## 2014-06-10 DIAGNOSIS — Z09 Encounter for follow-up examination after completed treatment for conditions other than malignant neoplasm: Secondary | ICD-10-CM | POA: Diagnosis not present

## 2014-06-10 DIAGNOSIS — K449 Diaphragmatic hernia without obstruction or gangrene: Secondary | ICD-10-CM | POA: Diagnosis not present

## 2014-06-10 DIAGNOSIS — Z8711 Personal history of peptic ulcer disease: Secondary | ICD-10-CM | POA: Diagnosis not present

## 2014-07-04 ENCOUNTER — Encounter: Payer: Self-pay | Admitting: General Practice

## 2014-07-04 ENCOUNTER — Ambulatory Visit (INDEPENDENT_AMBULATORY_CARE_PROVIDER_SITE_OTHER): Payer: Medicare Other | Admitting: Family Medicine

## 2014-07-04 ENCOUNTER — Encounter: Payer: Self-pay | Admitting: Family Medicine

## 2014-07-04 VITALS — BP 134/86 | HR 57 | Temp 97.9°F | Resp 16

## 2014-07-04 DIAGNOSIS — Z89611 Acquired absence of right leg above knee: Secondary | ICD-10-CM | POA: Diagnosis not present

## 2014-07-04 DIAGNOSIS — E785 Hyperlipidemia, unspecified: Secondary | ICD-10-CM

## 2014-07-04 DIAGNOSIS — E1159 Type 2 diabetes mellitus with other circulatory complications: Secondary | ICD-10-CM | POA: Diagnosis not present

## 2014-07-04 DIAGNOSIS — I10 Essential (primary) hypertension: Secondary | ICD-10-CM

## 2014-07-04 LAB — CBC WITH DIFFERENTIAL/PLATELET
BASOS ABS: 0 10*3/uL (ref 0.0–0.1)
BASOS PCT: 0.5 % (ref 0.0–3.0)
Eosinophils Absolute: 0.2 10*3/uL (ref 0.0–0.7)
Eosinophils Relative: 3.3 % (ref 0.0–5.0)
HCT: 35.5 % — ABNORMAL LOW (ref 36.0–46.0)
Hemoglobin: 11.7 g/dL — ABNORMAL LOW (ref 12.0–15.0)
LYMPHS PCT: 30.5 % (ref 12.0–46.0)
Lymphs Abs: 2.2 10*3/uL (ref 0.7–4.0)
MCHC: 32.9 g/dL (ref 30.0–36.0)
MCV: 87.7 fl (ref 78.0–100.0)
MONO ABS: 0.7 10*3/uL (ref 0.1–1.0)
MONOS PCT: 9.6 % (ref 3.0–12.0)
NEUTROS ABS: 4.1 10*3/uL (ref 1.4–7.7)
Neutrophils Relative %: 56.1 % (ref 43.0–77.0)
PLATELETS: 195 10*3/uL (ref 150.0–400.0)
RBC: 4.05 Mil/uL (ref 3.87–5.11)
RDW: 15.7 % — AB (ref 11.5–15.5)
WBC: 7.3 10*3/uL (ref 4.0–10.5)

## 2014-07-04 LAB — BASIC METABOLIC PANEL
BUN: 15 mg/dL (ref 6–23)
CALCIUM: 8.8 mg/dL (ref 8.4–10.5)
CO2: 26 mEq/L (ref 19–32)
Chloride: 105 mEq/L (ref 96–112)
Creatinine, Ser: 0.95 mg/dL (ref 0.40–1.20)
GFR: 58.34 mL/min — ABNORMAL LOW (ref >60.00)
Glucose, Bld: 101 mg/dL — ABNORMAL HIGH (ref 70–99)
POTASSIUM: 4.4 meq/L (ref 3.5–5.1)
SODIUM: 137 meq/L (ref 135–145)

## 2014-07-04 LAB — LIPID PANEL
Cholesterol: 115 mg/dL (ref 0–200)
HDL: 36.3 mg/dL — ABNORMAL LOW (ref >39.00)
LDL Cholesterol: 63 mg/dL (ref 0–99)
NonHDL: 78.7
Total CHOL/HDL Ratio: 3
Triglycerides: 78 mg/dL (ref 0.0–149.0)
VLDL: 15.6 mg/dL (ref 0.0–40.0)

## 2014-07-04 LAB — HEPATIC FUNCTION PANEL
ALBUMIN: 3.6 g/dL (ref 3.5–5.2)
ALK PHOS: 66 U/L (ref 39–117)
ALT: 14 U/L (ref 0–35)
AST: 27 U/L (ref 0–37)
Bilirubin, Direct: 0.1 mg/dL (ref 0.0–0.3)
Total Bilirubin: 0.5 mg/dL (ref 0.2–1.2)
Total Protein: 6.6 g/dL (ref 6.0–8.3)

## 2014-07-04 LAB — HEMOGLOBIN A1C: HEMOGLOBIN A1C: 5.8 % (ref 4.6–6.5)

## 2014-07-04 LAB — TSH: TSH: 1.8 u[IU]/mL (ref 0.35–4.50)

## 2014-07-04 NOTE — Patient Instructions (Signed)
Schedule your complete physical in 6 months We'll notify you of your lab results and make any changes if needed Please elevate your leg and take your lasix regularly Call with any questions or concerns Hang in there!!!

## 2014-07-04 NOTE — Assessment & Plan Note (Signed)
Chronic problem.  Well controlled.  Asymptomatic w/ exception of chronic edema.  Check labs.  No anticipated med changes.  Will follow.

## 2014-07-04 NOTE — Assessment & Plan Note (Signed)
Chronic problem.  This has been well controlled w/ diet and pt has not required meds.  Overdue on eye exam.  Pt encouraged to schedule.  Not on ACE/ARB- check microalbumin.  Check labs.  Start meds prn.

## 2014-07-04 NOTE — Progress Notes (Signed)
Pre visit review using our clinic review tool, if applicable. No additional management support is needed unless otherwise documented below in the visit note. 

## 2014-07-04 NOTE — Assessment & Plan Note (Signed)
Pt is s/p R AKA.  Is in need of new prosthetic leg in order to improve mobility.  Pt had appt w/ [employer] who indicated she needed a new prescription to have this done.  Pt has not been up on her L leg in years and I worry that even w/ a new prosthesis she would not be strong enough to ambulate.  Will follow closely.

## 2014-07-04 NOTE — Progress Notes (Signed)
   Subjective:    Patient ID: Christina Pittman, female    DOB: [birth year], 81 y.o.   MRN: [id number]  HPI S/p R AKA- pt reports that her current prosthetic leg doesn't fit and she is interested in having a new leg made.  Needs new prescription.  Is currently a pt at [facility] in [place].  HTN- chronic problem, on Coreg, lasix.  No CP, SOB, HAs, visual changes.  Chronic edema of L leg unchanged.  Hyperlipidemia- chronic problem, on Simvastatin.  No abd pain, N/V.  DM- diet controlled.  Not currently on meds.  Not currently on ACE/ARB.  Overdue for eye exam.     Review of Systems For ROS see HPI     Objective:   Physical Exam  Constitutional: She is oriented to person, place, and time. She appears well-developed and well-nourished. No distress.  HENT:  Head: Normocephalic and atraumatic.  Eyes: Conjunctivae and EOM are normal. Pupils are equal, round, and reactive to light.  Neck: Normal range of motion. Neck supple. No thyromegaly present.  Cardiovascular: Normal rate, regular rhythm and intact distal pulses.   Murmur (II/VI SEM at RUSB) heard. Pulmonary/Chest: Effort normal and breath sounds normal. No respiratory distress.  Abdominal: Soft. She exhibits no distension. There is no tenderness.  Musculoskeletal: She exhibits edema (1-2+ pitting edema of L LE).  Lymphadenopathy:    She has no cervical adenopathy.  Neurological: She is alert and oriented to person, place, and time.  Skin: Skin is warm and dry.  Psychiatric: She has a normal mood and affect. Her behavior is normal.  Vitals reviewed.         Assessment & Plan:

## 2014-07-04 NOTE — Assessment & Plan Note (Signed)
Chronic problem.  Tolerating statin w/o difficulty.  Check labs.  Adjust meds prn  

## 2014-07-05 LAB — MICROALBUMIN / CREATININE URINE RATIO
CREATININE, U: 26.1 mg/dL
MICROALB UR: 1.7 mg/dL (ref 0.0–1.9)
Microalb Creat Ratio: 6.5 mg/g (ref 0.0–30.0)

## 2014-07-06 ENCOUNTER — Encounter: Payer: Self-pay | Admitting: General Practice

## 2014-07-18 ENCOUNTER — Other Ambulatory Visit: Payer: Self-pay | Admitting: Family Medicine

## 2014-07-19 NOTE — Telephone Encounter (Signed)
Med filled.  

## 2014-08-09 ENCOUNTER — Other Ambulatory Visit: Payer: Self-pay | Admitting: General Practice

## 2014-08-09 MED ORDER — SIMVASTATIN 20 MG PO TABS
ORAL_TABLET | ORAL | Status: DC
Start: 2014-08-09 — End: 2015-03-07

## 2014-08-09 MED ORDER — CLOPIDOGREL BISULFATE 75 MG PO TABS: ORAL_TABLET | ORAL | Status: DC

## 2014-08-11 ENCOUNTER — Telehealth: Payer: Self-pay | Admitting: Family Medicine

## 2014-08-11 DIAGNOSIS — C44329 Squamous cell carcinoma of skin of other parts of face: Secondary | ICD-10-CM | POA: Diagnosis not present

## 2014-08-11 DIAGNOSIS — L309 Dermatitis, unspecified: Secondary | ICD-10-CM | POA: Diagnosis not present

## 2014-08-11 DIAGNOSIS — L7211 Pilar cyst: Secondary | ICD-10-CM | POA: Diagnosis not present

## 2014-08-11 DIAGNOSIS — C44320 Squamous cell carcinoma of skin of unspecified parts of face: Secondary | ICD-10-CM | POA: Diagnosis not present

## 2014-08-11 NOTE — Telephone Encounter (Signed)
Please have family send me records from treating physician so I can accurately determine the next steps

## 2014-08-11 NOTE — Telephone Encounter (Signed)
Called pt daughter and she stated they are just wanting to know so that way when surgery is set they can be prepared. Please advise?

## 2014-08-11 NOTE — Telephone Encounter (Signed)
Caller name: gail Relation to pt: daughter Call back number:   [phone] Pharmacy:  Reason for call:   States that patient is having a cyst removed from her head and is wanting to know how long she should be off of plavix before procedure. Doesn't know when procedure is.

## 2014-08-12 NOTE — Telephone Encounter (Signed)
Spoke with pt daughter and advised that we would need last OV note faxed to our office before we could make a decision. She expressed an understanding and advised she would have that sent.

## 2014-09-09 ENCOUNTER — Telehealth: Payer: Self-pay | Admitting: Family Medicine

## 2014-09-09 DIAGNOSIS — I639 Cerebral infarction, unspecified: Secondary | ICD-10-CM

## 2014-09-09 DIAGNOSIS — G8191 Hemiplegia, unspecified affecting right dominant side: Secondary | ICD-10-CM

## 2014-09-09 NOTE — Telephone Encounter (Signed)
Ok to place referral.

## 2014-09-09 NOTE — Telephone Encounter (Signed)
Ok for referral?

## 2014-09-09 NOTE — Telephone Encounter (Signed)
Referral placed.

## 2014-09-09 NOTE — Telephone Encounter (Signed)
Referral faxed

## 2014-09-09 NOTE — Telephone Encounter (Signed)
Caller name: Vance Peper Relationship to patient: granddaughter Can be reached: [phone]  Reason for call: Pt wanting to go to rehab/PT to help her get walking. She wants to go to [facility] at [address], [place]. Ph# [phone]. They told pt she needs referral from Dr. Birdie Riddle.

## 2014-09-21 ENCOUNTER — Other Ambulatory Visit: Payer: Self-pay | Admitting: Family Medicine

## 2014-09-22 NOTE — Telephone Encounter (Signed)
Medication filled to pharmacy as requested.   

## 2014-09-26 DIAGNOSIS — L720 Epidermal cyst: Secondary | ICD-10-CM | POA: Diagnosis not present

## 2014-10-17 DIAGNOSIS — L720 Epidermal cyst: Secondary | ICD-10-CM | POA: Diagnosis not present

## 2014-10-17 DIAGNOSIS — L7211 Pilar cyst: Secondary | ICD-10-CM | POA: Diagnosis not present

## 2014-10-19 DIAGNOSIS — Z89611 Acquired absence of right leg above knee: Secondary | ICD-10-CM | POA: Diagnosis not present

## 2014-10-19 DIAGNOSIS — I639 Cerebral infarction, unspecified: Secondary | ICD-10-CM | POA: Diagnosis not present

## 2014-10-19 DIAGNOSIS — G8190 Hemiplegia, unspecified affecting unspecified side: Secondary | ICD-10-CM | POA: Diagnosis not present

## 2014-11-18 DIAGNOSIS — I639 Cerebral infarction, unspecified: Secondary | ICD-10-CM | POA: Diagnosis not present

## 2014-11-18 DIAGNOSIS — G8190 Hemiplegia, unspecified affecting unspecified side: Secondary | ICD-10-CM | POA: Diagnosis not present

## 2014-11-18 DIAGNOSIS — Z89611 Acquired absence of right leg above knee: Secondary | ICD-10-CM | POA: Diagnosis not present

## 2014-12-06 DIAGNOSIS — G8190 Hemiplegia, unspecified affecting unspecified side: Secondary | ICD-10-CM | POA: Diagnosis not present

## 2014-12-06 DIAGNOSIS — Z89611 Acquired absence of right leg above knee: Secondary | ICD-10-CM | POA: Diagnosis not present

## 2014-12-06 DIAGNOSIS — I639 Cerebral infarction, unspecified: Secondary | ICD-10-CM | POA: Diagnosis not present

## 2014-12-19 DIAGNOSIS — G8190 Hemiplegia, unspecified affecting unspecified side: Secondary | ICD-10-CM | POA: Diagnosis not present

## 2014-12-19 DIAGNOSIS — Z89611 Acquired absence of right leg above knee: Secondary | ICD-10-CM | POA: Diagnosis not present

## 2014-12-19 DIAGNOSIS — I639 Cerebral infarction, unspecified: Secondary | ICD-10-CM | POA: Diagnosis not present

## 2015-01-03 ENCOUNTER — Telehealth: Payer: Self-pay | Admitting: Behavioral Health

## 2015-01-03 NOTE — Telephone Encounter (Signed)
Unable to reach the patient's daughter at time of Pre-Visit Call.  Left message for patient to return call when available.

## 2015-01-04 ENCOUNTER — Encounter: Payer: Self-pay | Admitting: General Practice

## 2015-01-04 ENCOUNTER — Encounter: Payer: Self-pay | Admitting: Family Medicine

## 2015-01-04 ENCOUNTER — Ambulatory Visit (INDEPENDENT_AMBULATORY_CARE_PROVIDER_SITE_OTHER): Payer: Medicare Other | Admitting: Family Medicine

## 2015-01-04 VITALS — BP 132/82 | HR 56 | Temp 98.2°F | Resp 17 | Ht 62.0 in

## 2015-01-04 DIAGNOSIS — I10 Essential (primary) hypertension: Secondary | ICD-10-CM | POA: Diagnosis not present

## 2015-01-04 DIAGNOSIS — Z89611 Acquired absence of right leg above knee: Secondary | ICD-10-CM

## 2015-01-04 DIAGNOSIS — E785 Hyperlipidemia, unspecified: Secondary | ICD-10-CM

## 2015-01-04 DIAGNOSIS — E1159 Type 2 diabetes mellitus with other circulatory complications: Secondary | ICD-10-CM

## 2015-01-04 DIAGNOSIS — G8191 Hemiplegia, unspecified affecting right dominant side: Secondary | ICD-10-CM | POA: Diagnosis not present

## 2015-01-04 DIAGNOSIS — D352 Benign neoplasm of pituitary gland: Secondary | ICD-10-CM | POA: Diagnosis not present

## 2015-01-04 DIAGNOSIS — Z0000 Encounter for general adult medical examination without abnormal findings: Secondary | ICD-10-CM | POA: Diagnosis not present

## 2015-01-04 DIAGNOSIS — I639 Cerebral infarction, unspecified: Secondary | ICD-10-CM

## 2015-01-04 DIAGNOSIS — L309 Dermatitis, unspecified: Secondary | ICD-10-CM

## 2015-01-04 LAB — HEPATIC FUNCTION PANEL
ALK PHOS: 62 U/L (ref 39–117)
ALT: 8 U/L (ref 0–35)
AST: 16 U/L (ref 0–37)
Albumin: 3.2 g/dL — ABNORMAL LOW (ref 3.5–5.2)
BILIRUBIN DIRECT: 0.1 mg/dL (ref 0.0–0.3)
Total Bilirubin: 0.5 mg/dL (ref 0.2–1.2)
Total Protein: 6.3 g/dL (ref 6.0–8.3)

## 2015-01-04 LAB — CBC WITH DIFFERENTIAL/PLATELET
BASOS PCT: 0.5 % (ref 0.0–3.0)
Basophils Absolute: 0 10*3/uL (ref 0.0–0.1)
EOS PCT: 2.5 % (ref 0.0–5.0)
Eosinophils Absolute: 0.2 10*3/uL (ref 0.0–0.7)
HCT: 38.5 % (ref 36.0–46.0)
Hemoglobin: 12.4 g/dL (ref 12.0–15.0)
LYMPHS ABS: 2.1 10*3/uL (ref 0.7–4.0)
Lymphocytes Relative: 23.2 % (ref 12.0–46.0)
MCHC: 32.2 g/dL (ref 30.0–36.0)
MCV: 89.8 fl (ref 78.0–100.0)
MONOS PCT: 9.5 % (ref 3.0–12.0)
Monocytes Absolute: 0.9 10*3/uL (ref 0.1–1.0)
NEUTROS ABS: 5.9 10*3/uL (ref 1.4–7.7)
NEUTROS PCT: 64.3 % (ref 43.0–77.0)
PLATELETS: 233 10*3/uL (ref 150.0–400.0)
RBC: 4.28 Mil/uL (ref 3.87–5.11)
RDW: 14.1 % (ref 11.5–15.5)
WBC: 9.1 10*3/uL (ref 4.0–10.5)

## 2015-01-04 LAB — LIPID PANEL
CHOLESTEROL: 102 mg/dL (ref 0–200)
HDL: 30.6 mg/dL — ABNORMAL LOW (ref >39.00)
LDL Cholesterol: 57 mg/dL (ref 0–99)
NonHDL: 71.1
TRIGLYCERIDES: 72 mg/dL (ref 0.0–149.0)
Total CHOL/HDL Ratio: 3
VLDL: 14.4 mg/dL (ref 0.0–40.0)

## 2015-01-04 LAB — BASIC METABOLIC PANEL
BUN: 14 mg/dL (ref 6–23)
CHLORIDE: 108 meq/L (ref 96–112)
CO2: 28 mEq/L (ref 19–32)
Calcium: 8.5 mg/dL (ref 8.4–10.5)
Creatinine, Ser: 0.92 mg/dL (ref 0.40–1.20)
GFR: 60.47 mL/min (ref >60.00)
GLUCOSE: 86 mg/dL (ref 70–99)
POTASSIUM: 4.1 meq/L (ref 3.5–5.1)
Sodium: 142 mEq/L (ref 135–145)

## 2015-01-04 LAB — TSH: TSH: 1.59 u[IU]/mL (ref 0.35–4.50)

## 2015-01-04 LAB — HEMOGLOBIN A1C: HEMOGLOBIN A1C: 6.1 % (ref 4.6–6.5)

## 2015-01-04 MED ORDER — TRIAMCINOLONE ACETONIDE 0.1 % EX OINT: 1.0000 "application " | TOPICAL_OINTMENT | Freq: Two times a day (BID) | CUTANEOUS | Status: DC

## 2015-01-04 NOTE — Progress Notes (Signed)
Pre visit review using our clinic review tool, if applicable. No additional management support is needed unless otherwise documented below in the visit note. 

## 2015-01-04 NOTE — Progress Notes (Signed)
   Subjective:    Patient ID: Christina Pittman, female    DOB: [birth year], 81 y.o.   MRN: [id number]  HPI Here today for CPE.  Risk Factors: HTN- chronic problem, on Coreg and Lasix w/ adequate control.  Denies CP, SOB, HAs, edema of L leg. Hyperlipidemia- chronic problem, on Simvastatin.  Denies abd pain, N/V. DM- chronic problem.  Currently diet controlled.  Denies symptomatic lows Hx of CVA w/ R hemiparesis- chronic problem, on Plavix Pituitary macroadenoma- noted on MRI done 2013.  Pt was not aware that she had this.  Not interested in w/u unless she develops HAs or blurry vision Physical Activity: limited due to R AKA Fall Risk: high- confined to wheelchair Depression: denies Hearing: decreased to conversational tones and whispered voice ADL's: needs assistance w/ food prep, dressing Cognitive: normal linear thought process, decreased memory Home Safety: safe at home Height, Weight, BMI, Visual Acuity: see vitals, vision corrected to 20/20 w/ cataract implants Counseling: pt declines colonoscopy, mammo, DEXA.  Declines flu and PNA shots Care team reviewed and updated w/ pt- not currently seeing any other providers Labs Ordered: See A&P Care Plan: See A&P    Review of Systems Patient reports no vision/ hearing changes, adenopathy,fever, weight change,  persistant/recurrent hoarseness , swallowing issues, chest pain, palpitations, edema, persistant/recurrent cough, hemoptysis, dyspnea (rest/exertional/paroxysmal nocturnal), gastrointestinal bleeding (melena, rectal bleeding), abdominal pain, significant heartburn, bowel changes, GU symptoms (dysuria, hematuria, incontinence), Gyn symptoms (abnormal  bleeding, pain),  syncope, focal weakness, memory loss, numbness & tingling, hair/nail changes, abnormal bruising or bleeding, anxiety, or depression.   + eczema    Objective:   Physical Exam  Constitutional: She appears well-developed. No distress.  Sitting in wheelchair  HENT:  Head:  Normocephalic and atraumatic.  Nose: Nose normal.  Mouth/Throat: Oropharynx is clear and moist. No oropharyngeal exudate.  Eyes: Conjunctivae and EOM are normal. Pupils are equal, round, and reactive to light. Right eye exhibits no discharge. Left eye exhibits no discharge.  Neck: Normal range of motion. Neck supple. No thyromegaly present.  Cardiovascular: Normal rate and regular rhythm.   Pulmonary/Chest: Effort normal and breath sounds normal. No respiratory distress. She has no wheezes. She has no rales.  Abdominal: Soft. She exhibits no distension. There is no tenderness. There is no rebound and no guarding.  Musculoskeletal: She exhibits no edema (no edema of L leg).  Lymphadenopathy:    She has no cervical adenopathy.  Neurological: She is alert.  Oriented to person, place today  Skin: Skin is warm and dry. No erythema.  Psychiatric: She has a normal mood and affect. Her behavior is normal. Thought content normal.  Vitals reviewed.         Assessment & Plan:

## 2015-01-04 NOTE — Patient Instructions (Signed)
Follow up in 6 months to recheck BP, cholesterol, and diabetes (this will be with your new provider) We'll notify you of your lab results and make any changes if needed At this time, you have declined flu and pneumonia shots.  You also decided to hold off on mammogram and bone density Call with any questions or concerns Happy Holidays!!!

## 2015-01-05 ENCOUNTER — Telehealth: Payer: Self-pay | Admitting: Family Medicine

## 2015-01-05 LAB — PROLACTIN: PROLACTIN: 7.9 ng/mL

## 2015-01-05 NOTE — Telephone Encounter (Signed)
Called pt daughter and advised of provider recommendations. She expressed an understanding.

## 2015-01-05 NOTE — Telephone Encounter (Signed)
She has a 2 cm pituitary macroadenoma that was seen on MRI in 2013.  She is completely asymptomatic from this- no headaches, visual changes.  It is highly unlikely that this is cancer after this much time has passed.  Ms Christina Pittman did not want to pursue any additional work up or repeat imaging- I am in agreement w/ this

## 2015-01-05 NOTE — Telephone Encounter (Signed)
Pt daughter called and wanted to know about the growth behind her eye? She is worried it is cancer and wants to know what she should do. Please call pt at home #.

## 2015-02-06 NOTE — Assessment & Plan Note (Signed)
Chronic problem.  Tolerating statin w/o difficulty.  Check labs.  Adjust meds prn  

## 2015-02-06 NOTE — Assessment & Plan Note (Signed)
Noted on previous MRI.  Pt is not interested in additional w/u at this time b/c she is completely asymptomatic.  No HAs or visual changes.  Pt was not aware that she had this.  Will check prolactin level and determine next steps if elevated.

## 2015-02-06 NOTE — Assessment & Plan Note (Signed)
Chronic problem.  Currently controlled w/o medications.  Pt declines eye exam, foot exam.  UTD on microalbumin.  Check labs.  Adjust tx plan prn.

## 2015-02-06 NOTE — Assessment & Plan Note (Signed)
Chronic problem.  Adequate control.  Asymptomatic.  Check labs.  No anticipated med changes 

## 2015-02-06 NOTE — Assessment & Plan Note (Signed)
Ongoing issue for pt.  Secondary to previous CVA.  Currently on Plavix and statin to reduce recurrence.

## 2015-02-06 NOTE — Assessment & Plan Note (Signed)
Pt's PE unchanged from previous.  Pt is declining all health maintenance items due to advanced age.  Family is in agreement w/ this and feels that at this point, less is more.  They want acute issues treated and things that will improve quality of life, but they are not interested in routine procedures.  I am in agreement with this as both the pt and the family are on the same page.  Check labs.  Anticipatory guidance provided.

## 2015-02-06 NOTE — Assessment & Plan Note (Signed)
Chronic issue for pt.  Her prosthesis is poorly fitting and she is unable to ambulate.

## 2015-02-06 NOTE — Assessment & Plan Note (Addendum)
New.  Pt w/ patch of dry skin on L wrist.  Pt to use triamcinolone cream prn.  Family expressed understanding.

## 2015-02-15 ENCOUNTER — Other Ambulatory Visit: Payer: Self-pay | Admitting: Family Medicine

## 2015-03-07 ENCOUNTER — Other Ambulatory Visit: Payer: Self-pay | Admitting: Family Medicine

## 2015-03-08 NOTE — Telephone Encounter (Signed)
Medication filled to pharmacy as requested.   

## 2015-03-22 ENCOUNTER — Emergency Department (HOSPITAL_BASED_OUTPATIENT_CLINIC_OR_DEPARTMENT_OTHER): Payer: Medicare Other

## 2015-03-22 ENCOUNTER — Emergency Department (HOSPITAL_BASED_OUTPATIENT_CLINIC_OR_DEPARTMENT_OTHER)
Admission: EM | Admit: 2015-03-22 | Discharge: 2015-03-22 | Discharge status: Against Medical Advice | Payer: Medicare Other | Attending: Emergency Medicine | Admitting: Emergency Medicine

## 2015-03-22 ENCOUNTER — Encounter (HOSPITAL_BASED_OUTPATIENT_CLINIC_OR_DEPARTMENT_OTHER): Payer: Self-pay

## 2015-03-22 DIAGNOSIS — N390 Urinary tract infection, site not specified: Secondary | ICD-10-CM

## 2015-03-22 DIAGNOSIS — Z7952 Long term (current) use of systemic steroids: Secondary | ICD-10-CM | POA: Insufficient documentation

## 2015-03-22 DIAGNOSIS — R4182 Altered mental status, unspecified: Secondary | ICD-10-CM | POA: Diagnosis present

## 2015-03-22 DIAGNOSIS — R011 Cardiac murmur, unspecified: Secondary | ICD-10-CM | POA: Insufficient documentation

## 2015-03-22 DIAGNOSIS — Z79899 Other long term (current) drug therapy: Secondary | ICD-10-CM | POA: Diagnosis not present

## 2015-03-22 DIAGNOSIS — Z86011 Personal history of benign neoplasm of the brain: Secondary | ICD-10-CM | POA: Insufficient documentation

## 2015-03-22 DIAGNOSIS — I4891 Unspecified atrial fibrillation: Secondary | ICD-10-CM

## 2015-03-22 DIAGNOSIS — G459 Transient cerebral ischemic attack, unspecified: Secondary | ICD-10-CM | POA: Diagnosis not present

## 2015-03-22 DIAGNOSIS — E119 Type 2 diabetes mellitus without complications: Secondary | ICD-10-CM | POA: Insufficient documentation

## 2015-03-22 DIAGNOSIS — Z8701 Personal history of pneumonia (recurrent): Secondary | ICD-10-CM | POA: Insufficient documentation

## 2015-03-22 DIAGNOSIS — I10 Essential (primary) hypertension: Secondary | ICD-10-CM | POA: Insufficient documentation

## 2015-03-22 DIAGNOSIS — Z8619 Personal history of other infectious and parasitic diseases: Secondary | ICD-10-CM | POA: Diagnosis not present

## 2015-03-22 DIAGNOSIS — Z8673 Personal history of transient ischemic attack (TIA), and cerebral infarction without residual deficits: Secondary | ICD-10-CM | POA: Diagnosis not present

## 2015-03-22 DIAGNOSIS — E785 Hyperlipidemia, unspecified: Secondary | ICD-10-CM | POA: Diagnosis not present

## 2015-03-22 DIAGNOSIS — Z8739 Personal history of other diseases of the musculoskeletal system and connective tissue: Secondary | ICD-10-CM | POA: Insufficient documentation

## 2015-03-22 DIAGNOSIS — R0981 Nasal congestion: Secondary | ICD-10-CM | POA: Insufficient documentation

## 2015-03-22 DIAGNOSIS — Z7902 Long term (current) use of antithrombotics/antiplatelets: Secondary | ICD-10-CM | POA: Insufficient documentation

## 2015-03-22 DIAGNOSIS — Z8781 Personal history of (healed) traumatic fracture: Secondary | ICD-10-CM | POA: Insufficient documentation

## 2015-03-22 LAB — COMPREHENSIVE METABOLIC PANEL
ALK PHOS: 73 U/L (ref 38–126)
ALT: 14 U/L (ref 14–54)
AST: 26 U/L (ref 15–41)
Albumin: 3.2 g/dL — ABNORMAL LOW (ref 3.5–5.0)
Anion gap: 6 (ref 5–15)
BILIRUBIN TOTAL: 0.6 mg/dL (ref 0.3–1.2)
BUN: 15 mg/dL (ref 6–20)
CO2: 25 mmol/L (ref 22–32)
CREATININE: 0.96 mg/dL (ref 0.44–1.00)
Calcium: 8.3 mg/dL — ABNORMAL LOW (ref 8.9–10.3)
Chloride: 108 mmol/L (ref 101–111)
GFR calc Af Amer: 57 mL/min — ABNORMAL LOW (ref >60)
GFR, EST NON AFRICAN AMERICAN: 49 mL/min — AB (ref >60)
GLUCOSE: 111 mg/dL — AB (ref 65–99)
Potassium: 4.1 mmol/L (ref 3.5–5.1)
Sodium: 139 mmol/L (ref 135–145)
TOTAL PROTEIN: 6.5 g/dL (ref 6.5–8.1)

## 2015-03-22 LAB — CBC WITH DIFFERENTIAL/PLATELET
BASOS ABS: 0.1 10*3/uL (ref 0.0–0.1)
Basophils Relative: 1 %
EOS ABS: 0.2 10*3/uL (ref 0.0–0.7)
EOS PCT: 2 %
HCT: 37.7 % (ref 36.0–46.0)
Hemoglobin: 12.6 g/dL (ref 12.0–15.0)
Lymphocytes Relative: 25 %
Lymphs Abs: 2.1 10*3/uL (ref 0.7–4.0)
MCH: 30.4 pg (ref 26.0–34.0)
MCHC: 33.4 g/dL (ref 30.0–36.0)
MCV: 90.8 fL (ref 78.0–100.0)
MONO ABS: 1.3 10*3/uL — AB (ref 0.1–1.0)
Monocytes Relative: 15 %
Neutro Abs: 4.7 10*3/uL (ref 1.7–7.7)
Neutrophils Relative %: 57 %
Platelets: 190 10*3/uL (ref 150–400)
RBC: 4.15 MIL/uL (ref 3.87–5.11)
RDW: 15.1 % (ref 11.5–15.5)
WBC: 8.3 10*3/uL (ref 4.0–10.5)

## 2015-03-22 LAB — URINE MICROSCOPIC-ADD ON

## 2015-03-22 LAB — URINALYSIS, ROUTINE W REFLEX MICROSCOPIC
Bilirubin Urine: NEGATIVE
GLUCOSE, UA: NEGATIVE mg/dL
Ketones, ur: NEGATIVE mg/dL
Nitrite: POSITIVE — AB
PH: 6 (ref 5.0–8.0)
Protein, ur: NEGATIVE mg/dL
SPECIFIC GRAVITY, URINE: 1.012 (ref 1.005–1.030)

## 2015-03-22 LAB — TROPONIN I

## 2015-03-22 LAB — RAPID STREP SCREEN (MED CTR MEBANE ONLY): Streptococcus, Group A Screen (Direct): NEGATIVE

## 2015-03-22 LAB — CBG MONITORING, ED: GLUCOSE-CAPILLARY: 110 mg/dL — AB (ref 65–99)

## 2015-03-22 MED ORDER — SODIUM CHLORIDE 0.9 % IV BOLUS (SEPSIS)
250.0000 mL | Freq: Once | INTRAVENOUS | Status: AC
  Administered 2015-03-22: 250 mL via INTRAVENOUS

## 2015-03-22 MED ORDER — SODIUM CHLORIDE 0.9 % IV SOLN
INTRAVENOUS | Status: DC
  Administered 2015-03-22: 18:00:00 via INTRAVENOUS

## 2015-03-22 MED ORDER — CEPHALEXIN 500 MG PO CAPS: 500.0000 mg | ORAL_CAPSULE | Freq: Four times a day (QID) | ORAL | Status: DC

## 2015-03-22 MED ORDER — DEXTROSE 5 % IV SOLN
1.0000 g | Freq: Once | INTRAVENOUS | Status: AC
  Administered 2015-03-22: 1 g via INTRAVENOUS
  Filled 2015-03-22: qty 10

## 2015-03-22 MED ORDER — SODIUM CHLORIDE 0.9 % IV SOLN: INTRAVENOUS | Status: DC

## 2015-03-22 NOTE — Discharge Instructions (Signed)
As we discussed we recommended admission both for the possibility of TIA and new onset atrial fibrillation. He understands the risk factors understand your follow-up with your regular doctor. In addition we found evidence of urinary tract infection. Take the Keflex as directed for the next 7 days.

## 2015-03-22 NOTE — ED Notes (Signed)
Pt assisted to [facility] x 2 staff  To get urine.

## 2015-03-22 NOTE — ED Notes (Signed)
At the end of triage pt was able to call her son and daughter by name and and to tell me her name "Christina Pittman"-pt was not able to tell me place, year-babbling speech-pt taken to tx area via w/c with family x 2

## 2015-03-22 NOTE — ED Provider Notes (Signed)
CSN: [id number]     Arrival date & time 03/22/15  1612 History   First MD Initiated Contact with Patient 03/22/15 1614     Chief Complaint  Patient presents with  . Altered Mental Status     (Consider location/radiation/quality/duration/timing/severity/associated sxs/prior Treatment) Patient is a 82 y.o. female presenting with altered mental status. The history is provided by the patient.  Altered Mental Status Presenting symptoms: confusion   Associated symptoms: no abdominal pain, no fever, no headaches and no rash    Patient brought in by family members for confusion and speech problems that occurred upon waking from a nap at 2:30 in the afternoon. Patient went down for the nap at 2:00 in the afternoon and everything was normal and baseline for her. Apparently patient's speech was slurred she is having difficulty getting out words. There appeared to be no obvious motor deficits to the extremities.  Patient with a history of strokes in the past. Patient is on Plavix not on any blood thinners. No history of any heart arrhythmias.   Past Medical History  Diagnosis Date  . Arthritis   . Diabetes mellitus   . Hypertension   . Heart murmur   . Chicken pox   . Fractured pelvis ([place]) 05/14/2011  . Stroke [facility])     feb 2013  . Pituitary macroadenoma ([place])   . Insomnia   . Hyperlipemia   . Hemiparesis, right ([place])   . Dysphagia   . Bilateral venous insufficiency   . Aspiration pneumonia ([place])   . Aneurysm of thoracic aorta ([place])   . Gangrene of foot [facility])    Past Surgical History  Procedure Laterality Date  . Abdominal hysterectomy    . Above knee leg amputation     Family History  Problem Relation Age of Onset  . Arthritis Mother   . Diabetes Sister   . Heart disease Brother   . Diabetes Brother    Social History  Substance Use Topics  . Smoking status: Never Smoker   . Smokeless tobacco: None  . Alcohol Use: No   OB History    No data available     Review of  Systems  Constitutional: Negative for fever.  HENT: Positive for congestion.   Eyes: Negative for visual disturbance.  Respiratory: Negative for shortness of breath.   Cardiovascular: Negative for chest pain.  Gastrointestinal: Negative for abdominal pain.  Genitourinary: Negative for dysuria.  Musculoskeletal: Negative for back pain.  Skin: Negative for rash.  Neurological: Positive for speech difficulty. Negative for headaches.  Psychiatric/Behavioral: Positive for confusion and decreased concentration.      Allergies  Betadine; Equalyte; Neosporin; and Codeine  Home Medications   Prior to Admission medications   Medication Sig Start Date End Date Taking? Authorizing Provider  carvedilol (COREG) 25 MG tablet TAKE ONE TABLET BY MOUTH TWICE DAILY WITH MEALS 02/17/15   Midge Minium, MD  cephALEXin (KEFLEX) 500 MG capsule Take 1 capsule (500 mg total) by mouth 4 (four) times daily. 03/22/15   Fredia Sorrow, MD  clopidogrel (PLAVIX) 75 MG tablet TAKE ONE TABLET BY MOUTH ONCE DAILY WITH BREAKFAST 03/08/15   Midge Minium, MD  furosemide (LASIX) 20 MG tablet TAKE ONE TABLET BY MOUTH ONCE DAILY AS NEEDED FOR  SWELLING 09/22/14   Midge Minium, MD  NONFORMULARY OR COMPOUNDED ITEM Pt is in need of a Right [place] Socket for her prosthetic. 06/02/13   Midge Minium, MD  pantoprazole (PROTONIX) 40 MG tablet Take  40 mg by mouth 2 (two) times daily.     Historical Provider, MD  simvastatin (ZOCOR) 20 MG tablet TAKE ONE TABLET BY MOUTH ONCE DAILY AT 6 PM 03/08/15   Midge Minium, MD  triamcinolone ointment (KENALOG) 0.1 % Apply 1 application topically 2 (two) times daily. 01/04/15   Midge Minium, MD  vitamin D, CHOLECALCIFEROL, 400 UNITS tablet Take 400 Units by mouth 2 (two) times daily.    Historical Provider, MD   BP 161/105 mmHg  Pulse 83  Temp(Src) 98 F (36.7 C) (Oral)  Resp 18  Wt 72.576 kg  SpO2 98% Physical Exam  Constitutional: She appears well-developed and  well-nourished. No distress.  HENT:  Head: Normocephalic and atraumatic.  Mouth/Throat: Oropharynx is clear and moist.  Eyes: Conjunctivae and EOM are normal. Pupils are equal, round, and reactive to light.  Neck: Normal range of motion.  Cardiovascular: Normal heart sounds.   Irregular normal rate  Pulmonary/Chest: Effort normal and breath sounds normal. No respiratory distress.  Abdominal: Soft. Bowel sounds are normal. There is no tenderness.  Musculoskeletal:  Right above-the-knee amputation left leg with swelling.  Neurological: She is alert. No cranial nerve deficit. She exhibits normal muscle tone. Coordination normal.  No obvious neuro deficits currently. Patient is alert and engages in conversation appropriately.  Nursing note and vitals reviewed.   ED Course  Procedures (including critical care time) Labs Review Labs Reviewed  URINALYSIS, ROUTINE W REFLEX MICROSCOPIC (NOT AT [facility]) - Abnormal; Notable for the following:    APPearance CLOUDY (*)    Hgb urine dipstick TRACE (*)    Nitrite POSITIVE (*)    Leukocytes, UA LARGE (*)    All other components within normal limits  COMPREHENSIVE METABOLIC PANEL - Abnormal; Notable for the following:    Glucose, Bld 111 (*)    Calcium 8.3 (*)    Albumin 3.2 (*)    GFR calc non Af Amer 49 (*)    GFR calc Af Amer 57 (*)    All other components within normal limits  CBC WITH DIFFERENTIAL/PLATELET - Abnormal; Notable for the following:    Monocytes Absolute 1.3 (*)    All other components within normal limits  URINE MICROSCOPIC-ADD ON - Abnormal; Notable for the following:    Squamous Epithelial / LPF 0-5 (*)    Bacteria, UA MANY (*)    All other components within normal limits  CBG MONITORING, ED - Abnormal; Notable for the following:    Glucose-Capillary 110 (*)    All other components within normal limits  RAPID STREP SCREEN (NOT AT [facility])  CULTURE, GROUP A STREP [facility])  URINE CULTURE  TROPONIN I    Imaging Review Dg  Chest 2 View  03/22/2015  CLINICAL DATA:  Altered mental status EXAM: CHEST  2 VIEW COMPARISON:  12/09/2013 FINDINGS: Cardiac shadow remains enlarged. Mild interstitial changes are again noted within both lungs without focal infiltrate. No sizable effusion is seen. No acute bony abnormality is noted. IMPRESSION: Chronic changes without acute abnormality. Electronically Signed   By: Inez Catalina M.D.   On: 03/22/2015 17:29   Ct Head Wo Contrast  03/22/2015  CLINICAL DATA:  Confusion and altered mental status EXAM: CT HEAD WITHOUT CONTRAST TECHNIQUE: Contiguous axial images were obtained from the base of the skull through the vertex without intravenous contrast. COMPARISON:  03/13/2011 FINDINGS: The bony calvarium is intact. Diffuse atrophic changes are noted. Encephalomalacia changes are seen in the right temporal parietal region new from the  prior exam. Similar changes are noted in the posterior left parietal region. These are consistent with chronic ischemia. Lacunar infarcts are noted within the basal ganglia and foul mild bilaterally. These are stable from the prior exam. No acute hemorrhage or acute infarction is seen. Fullness in the sella turcica is again noted consistent with a pituitary lesion. This is stable from the prior exam. IMPRESSION: Chronic ischemic changes increased from the prior exam. Stable appearing pituitary lesion No acute abnormality noted. Electronically Signed   By: Inez Catalina M.D.   On: 03/22/2015 17:34   I have personally reviewed and evaluated these images and lab results as part of my medical decision-making.   EKG Interpretation   Date/Time:  Wednesday March 22 2015 16:31:24 EST Ventricular Rate:  74 PR Interval:    QRS Duration: 146 QT Interval:  462 QTC Calculation: 513 R Axis:   71 Text Interpretation:  Atrial fibrillation Ventricular premature complex  IVCD, consider atypical LBBB Baseline wander in lead(s) I III aVR aVL aVF  V1 Confirmed by Tambria Pfannenstiel  MD,  Freida Nebel ([id number]) on 03/22/2015 4:37:23 PM      MDM   Final diagnoses:  Transient cerebral ischemia, unspecified transient cerebral ischemia type  Atrial fibrillation, unspecified type ([place])  UTI (lower urinary tract infection)   Patient refusing admission. Family did counsel her she's adamant about not wanting to be admitted. Follow-up with her doctor in the office tomorrow. Family and patient understand that the reason for admission was possible TIA and the new onset of atrial fibrillation. And that the atrial fibrillation could've been responsible for the TIA and that concerns for worsening symptoms or worse stroke is possible. Including death. Patient's symptoms that occurred at home have resolved here family agrees. Patient back to her baseline mental status. Speech is back to baseline.  Rest of workup, revealed evidence of urinary tract infection. Patient is willing to take Keflex and will given a dose of IV Rocephin here prior to leaving AMA.  Patient is on Plavix and she is will continue the Plavix.   Fredia Sorrow, MD 03/22/15 2017

## 2015-03-22 NOTE — ED Notes (Addendum)
Daughter states pt woke from nap with confusion-last normal 1400p-pt would not allow family that lifted her to the car to lift her out of car at ED entrance-pt found in front set passenger side of car to be alert-talking-uncooperative/batting staff hands away from her when attempts made to touch/move pt-pt was lifted from car to w/c by staff x four with ease and taken into ED-pt alert and family states she is "now getting back to normal"-uncooperative and pulled BP cuff off arm-pt will not answer orientation ?s but is talking some with family

## 2015-03-22 NOTE — ED Notes (Signed)
Pt given water 

## 2015-03-23 ENCOUNTER — Observation Stay (HOSPITAL_COMMUNITY)
Admission: EM | Admit: 2015-03-23 | Discharge: 2015-03-24 | Disposition: A | Discharge status: Home Health | Payer: Medicare Other | Attending: Internal Medicine | Admitting: Internal Medicine

## 2015-03-23 ENCOUNTER — Emergency Department (HOSPITAL_COMMUNITY): Payer: Medicare Other

## 2015-03-23 ENCOUNTER — Encounter (HOSPITAL_COMMUNITY): Payer: Self-pay | Admitting: *Deleted

## 2015-03-23 ENCOUNTER — Telehealth: Payer: Self-pay | Admitting: Family Medicine

## 2015-03-23 DIAGNOSIS — E1151 Type 2 diabetes mellitus with diabetic peripheral angiopathy without gangrene: Secondary | ICD-10-CM | POA: Diagnosis not present

## 2015-03-23 DIAGNOSIS — Z89619 Acquired absence of unspecified leg above knee: Secondary | ICD-10-CM | POA: Diagnosis not present

## 2015-03-23 DIAGNOSIS — Z86718 Personal history of other venous thrombosis and embolism: Secondary | ICD-10-CM | POA: Diagnosis not present

## 2015-03-23 DIAGNOSIS — I129 Hypertensive chronic kidney disease with stage 1 through stage 4 chronic kidney disease, or unspecified chronic kidney disease: Secondary | ICD-10-CM | POA: Insufficient documentation

## 2015-03-23 DIAGNOSIS — G458 Other transient cerebral ischemic attacks and related syndromes: Secondary | ICD-10-CM | POA: Diagnosis not present

## 2015-03-23 DIAGNOSIS — I10 Essential (primary) hypertension: Secondary | ICD-10-CM

## 2015-03-23 DIAGNOSIS — E1159 Type 2 diabetes mellitus with other circulatory complications: Secondary | ICD-10-CM | POA: Diagnosis not present

## 2015-03-23 DIAGNOSIS — R410 Disorientation, unspecified: Secondary | ICD-10-CM

## 2015-03-23 DIAGNOSIS — E1122 Type 2 diabetes mellitus with diabetic chronic kidney disease: Secondary | ICD-10-CM | POA: Insufficient documentation

## 2015-03-23 DIAGNOSIS — Z79899 Other long term (current) drug therapy: Secondary | ICD-10-CM | POA: Insufficient documentation

## 2015-03-23 DIAGNOSIS — I4891 Unspecified atrial fibrillation: Secondary | ICD-10-CM | POA: Diagnosis present

## 2015-03-23 DIAGNOSIS — I350 Nonrheumatic aortic (valve) stenosis: Secondary | ICD-10-CM | POA: Diagnosis not present

## 2015-03-23 DIAGNOSIS — Z7409 Other reduced mobility: Secondary | ICD-10-CM | POA: Diagnosis not present

## 2015-03-23 DIAGNOSIS — D352 Benign neoplasm of pituitary gland: Secondary | ICD-10-CM | POA: Insufficient documentation

## 2015-03-23 DIAGNOSIS — E785 Hyperlipidemia, unspecified: Secondary | ICD-10-CM | POA: Insufficient documentation

## 2015-03-23 DIAGNOSIS — Z993 Dependence on wheelchair: Secondary | ICD-10-CM | POA: Diagnosis not present

## 2015-03-23 DIAGNOSIS — Z8673 Personal history of transient ischemic attack (TIA), and cerebral infarction without residual deficits: Secondary | ICD-10-CM | POA: Diagnosis not present

## 2015-03-23 DIAGNOSIS — I481 Persistent atrial fibrillation: Secondary | ICD-10-CM

## 2015-03-23 DIAGNOSIS — N390 Urinary tract infection, site not specified: Secondary | ICD-10-CM | POA: Insufficient documentation

## 2015-03-23 DIAGNOSIS — Z8639 Personal history of other endocrine, nutritional and metabolic disease: Secondary | ICD-10-CM

## 2015-03-23 DIAGNOSIS — G459 Transient cerebral ischemic attack, unspecified: Secondary | ICD-10-CM | POA: Diagnosis not present

## 2015-03-23 DIAGNOSIS — Z7902 Long term (current) use of antithrombotics/antiplatelets: Secondary | ICD-10-CM | POA: Diagnosis not present

## 2015-03-23 DIAGNOSIS — R29818 Other symptoms and signs involving the nervous system: Secondary | ICD-10-CM | POA: Diagnosis present

## 2015-03-23 DIAGNOSIS — N183 Chronic kidney disease, stage 3 unspecified: Secondary | ICD-10-CM | POA: Diagnosis present

## 2015-03-23 DIAGNOSIS — R4781 Slurred speech: Secondary | ICD-10-CM | POA: Diagnosis not present

## 2015-03-23 LAB — URINE MICROSCOPIC-ADD ON: BACTERIA UA: NONE SEEN

## 2015-03-23 LAB — URINALYSIS, ROUTINE W REFLEX MICROSCOPIC
Bilirubin Urine: NEGATIVE
GLUCOSE, UA: NEGATIVE mg/dL
Hgb urine dipstick: NEGATIVE
Ketones, ur: NEGATIVE mg/dL
Nitrite: NEGATIVE
PROTEIN: NEGATIVE mg/dL
Specific Gravity, Urine: 1.007 (ref 1.005–1.030)
pH: 6 (ref 5.0–8.0)

## 2015-03-23 LAB — RAPID URINE DRUG SCREEN, HOSP PERFORMED
Amphetamines: NOT DETECTED
BARBITURATES: NOT DETECTED
Benzodiazepines: NOT DETECTED
COCAINE: NOT DETECTED
Opiates: NOT DETECTED
Tetrahydrocannabinol: NOT DETECTED

## 2015-03-23 LAB — ETHANOL

## 2015-03-23 LAB — PROTIME-INR
INR: 1.23 (ref 0.00–1.49)
PROTHROMBIN TIME: 15.7 s — AB (ref 11.6–15.2)

## 2015-03-23 LAB — APTT: APTT: 31 s (ref 24–37)

## 2015-03-23 MED ORDER — HEPARIN BOLUS VIA INFUSION
3600.0000 [IU] | Freq: Once | INTRAVENOUS | Status: DC
  Filled 2015-03-23: qty 3600

## 2015-03-23 MED ORDER — STROKE: EARLY STAGES OF RECOVERY BOOK
Freq: Once | Status: DC
  Filled 2015-03-23: qty 1

## 2015-03-23 MED ORDER — CARVEDILOL 25 MG PO TABS
25.0000 mg | ORAL_TABLET | Freq: Two times a day (BID) | ORAL | Status: DC
  Administered 2015-03-23 – 2015-03-24 (×2): 25 mg via ORAL
  Filled 2015-03-23: qty 1
  Filled 2015-03-23: qty 2

## 2015-03-23 MED ORDER — HEPARIN (PORCINE) IN NACL 100-0.45 UNIT/ML-% IJ SOLN
1000.0000 [IU]/h | INTRAMUSCULAR | Status: DC
  Filled 2015-03-23: qty 250

## 2015-03-23 MED ORDER — CLOPIDOGREL BISULFATE 75 MG PO TABS: 75.0000 mg | ORAL_TABLET | Freq: Every day | ORAL | Status: DC

## 2015-03-23 MED ORDER — ASPIRIN 81 MG PO CHEW
81.0000 mg | CHEWABLE_TABLET | Freq: Every day | ORAL | Status: DC
  Administered 2015-03-24: 81 mg via ORAL
  Filled 2015-03-23: qty 1

## 2015-03-23 MED ORDER — CEPHALEXIN 250 MG PO CAPS
250.0000 mg | ORAL_CAPSULE | Freq: Three times a day (TID) | ORAL | Status: DC
  Administered 2015-03-23: 250 mg via ORAL
  Filled 2015-03-23 (×3): qty 1

## 2015-03-23 MED ORDER — SIMVASTATIN 20 MG PO TABS
20.0000 mg | ORAL_TABLET | Freq: Every day | ORAL | Status: DC
  Filled 2015-03-23: qty 1

## 2015-03-23 MED ORDER — APIXABAN 5 MG PO TABS
5.0000 mg | ORAL_TABLET | Freq: Once | ORAL | Status: AC
  Administered 2015-03-23: 5 mg via ORAL
  Filled 2015-03-23: qty 1

## 2015-03-23 MED ORDER — LORAZEPAM 2 MG/ML IJ SOLN
0.2500 mg | Freq: Once | INTRAMUSCULAR | Status: DC
  Filled 2015-03-23: qty 1

## 2015-03-23 NOTE — ED Notes (Signed)
Attempted to call report

## 2015-03-23 NOTE — H&P (Signed)
[facility] History and Physical                                                                                    Christina Pittman, is a 82 y.o. female  MRN: [id number]   DOB - 1920-01-27  Admit Date - 03/23/2015  Outpatient Primary MD for the patient is Annye Asa, MD  Referring MD: Tyrone Nine / ER  Consulting M.D: Cheral Marker / Neurology; [place] / Cardiology  PMH: Past Medical History  Diagnosis Date  . Arthritis   . Diabetes mellitus   . Hypertension   . Heart murmur   . Chicken pox   . Fractured pelvis ([place]) 05/14/2011  . Stroke [facility])     feb 2013  . Pituitary macroadenoma ([place])   . Insomnia   . Hyperlipemia   . Hemiparesis, right ([place])   . Dysphagia   . Bilateral venous insufficiency   . Aspiration pneumonia ([place])   . Aneurysm of thoracic aorta ([place])   . Gangrene of foot ([facility])       PSH: Past Surgical History  Procedure Laterality Date  . Abdominal hysterectomy    . Above knee leg amputation       CC:  Chief Complaint  Patient presents with  . Transient Ischemic Attack     HPI: 82 year old female patient with history of peripheral vascular disease status post right AKA, history of stroke in ?? 2014, chronic kidney disease stage III, hypertension, diabetes, dyslipidemia, chronic stable pituitary macroadenoma as well as thoracic aortogram prior peroneal DVT. Patient initially presented to [facility] ED with reports of prolonged staring spell with very slurred, unintelligible speech for greater than 1 hour yesterday. Concerns were for TIA versus stroke given her past history. CT of the head was negative. Urinalysis was positive for UTI. Recommendations were for patient to be admitted but she left AMA. She did consent to getting Rocephin IM x 1 dose and was given a prescription for Keflex. Patient left AMA because she wanted approval from her primary care physician before pursuing stroke evaluation. The patient returned to the ER today after speaking with  Dr. Birdie Riddle who recommended that the patient  pursue stroke evaluation since EKG yesterday (and today) revealed new onset atrial fibrillation (controlled ventricular response). Patient symptoms have subsided. Patient's family also thinks she may have had some left-sided facial drooping. The also report that during the episode yesterday she kept touching the left side of her face as if she could not feel her face. Patient does not recall any of the events of yesterday during the onset of her symptoms.  ER Evaluation and treatment: Temperature 98.1-BP 145/79-Pulse 108-respirations 20- RA sats 100% EKG: Atrial fibrillation with ventricular rate 71 bpm, QTC 476 ms, underlying left bundle branch block unchanged from previous as of 2/22 but new since last EKG 2013 (except for left bundle branch block) MRI head without contrast: No acute stroke, large remote right MCA infarct with remote basal ganglia lacunar infarcts greater on the left and remote left parietal cortical infarct and remote bilateral cerebellar infarcts. Chronic pituitary mass is stable Laboratory data: Na 139, K 4.1, BUN 15, Cr 0.96, calcium  8.3, troponin less than 0.03, WBCs 8300 with neutrophils 57% and absolute neutrophils normal, hemoglobin 12.6, platelets 190,000,  (electrolyte panel and CBC from 2/22 )-coags normal except for mildly elevated PT of 15.7-urinalysis from 2/22 markedly abnormal and concerning for UTI urine culture pending-urinalysis today obtained after has received antibiotics improved but still a small leukocytes, 6-30 WBCs but nitrite negative, urine drug screen negative   Review of Systems   In addition to the HPI above,  No Fever-chills, myalgias or other constitutional symptoms No Headache, changes with Vision or hearing, new weakness, tingling, numbness in any extremity, No problems swallowing food or Liquids, indigestion/reflux No Chest pain, Cough or Shortness of Breath, palpitations, orthopnea or DOE No Abdominal  pain, N/V; no melena or hematochezia, no dark tarry stools No dysuria, hematuria or flank pain No new skin rashes, lesions, masses or bruises, No new joints pains-aches No recent weight gain or loss No polyuria, polydypsia or polyphagia,  *A full 10 point Review of Systems was done, except as stated above, all other Review of Systems were negative.  Social History Social History  Substance Use Topics  . Smoking status: Never Smoker   . Smokeless tobacco: Not on file  . Alcohol Use: No    Resides at: Private residence  Lives with: Alone  Ambulatory status: Wheelchair dependent   Family History Family History  Problem Relation Age of Onset  . Arthritis Mother   . Diabetes Sister   . Heart disease Brother   . Diabetes Brother      Prior to Admission medications   Medication Sig Start Date End Date Taking? Authorizing Provider  carvedilol (COREG) 25 MG tablet TAKE ONE TABLET BY MOUTH TWICE DAILY WITH MEALS 02/17/15  Yes Midge Minium, MD  clopidogrel (PLAVIX) 75 MG tablet TAKE ONE TABLET BY MOUTH ONCE DAILY WITH BREAKFAST 03/08/15  Yes Midge Minium, MD  furosemide (LASIX) 20 MG tablet TAKE ONE TABLET BY MOUTH ONCE DAILY AS NEEDED FOR  SWELLING 09/22/14  Yes Midge Minium, MD  NONFORMULARY OR COMPOUNDED ITEM Pt is in need of a Right AK Socket for her prosthetic. 06/02/13  Yes Midge Minium, MD  simvastatin (ZOCOR) 20 MG tablet TAKE ONE TABLET BY MOUTH ONCE DAILY AT 6 PM 03/08/15  Yes Midge Minium, MD  cephALEXin (KEFLEX) 500 MG capsule Take 1 capsule (500 mg total) by mouth 4 (four) times daily. Patient not taking: Reported on 03/23/2015 03/22/15   Fredia Sorrow, MD    Allergies  Allergen Reactions  . Betadine [Povidone Iodine] Hives    Because of a history of documented adverse serious drug reaction;Medi Alert bracelet  is recommended  . Equalyte Hives    Medi Alert bracelet  is recommended if verified . Drug not found in reference texts ([facility] or  [facility])  . Neosporin [Neomycin-Polymyxin-Gramicidin] Hives    Medi Alert bracelet  is recommended  . Codeine Other (See Comments)    Intolerance     Physical Exam  Vitals  Blood pressure 153/91, pulse 76, temperature 98.1 F (36.7 C), temperature source Oral, resp. rate 21, SpO2 98 %.   General:  In no acute distress, appears chronically ill and stated age  Psych:  flat affect, Awake Alert, Oriented X 3. Speech and thought patterns are clear and appropriate, no apparent short term memory deficits  Neuro:   No focal neurological deficits, CN II through XII intact, Strength 5/5 all 4 extremities, Sensation intact all 4 extremities.  ENT:  Ears  and Eyes appear Normal, Conjunctivae clear, PER. Moist oral mucosa without erythema or exudates.  Neck:  Supple, No lymphadenopathy appreciated  Respiratory:  Symmetrical chest wall movement, Good air movement bilaterally, CTAB. Room Air  Cardiac:  RRR, 2/6 systolic murmur right sternal border, chronic stable  LLE edema noted, no JVD, No carotid bruits, peripheral pulses palpable at 2+  Abdomen:  Positive bowel sounds, Soft, Non tender, Non distended,  No masses appreciated, no obvious hepatosplenomegaly  Skin:  No Cyanosis, Normal Skin Turgor, No Skin Rash or Bruise.  Extremities: Symmetrical without obvious trauma or injury,  no effusions.Prior right AKA  Data Review  CBC  Recent Labs Lab 03/22/15 1653  WBC 8.3  HGB 12.6  HCT 37.7  PLT 190  MCV 90.8  MCH 30.4  MCHC 33.4  RDW 15.1  LYMPHSABS 2.1  MONOABS 1.3*  EOSABS 0.2  BASOSABS 0.1    Chemistries   Recent Labs Lab 03/22/15 1653  NA 139  K 4.1  CL 108  CO2 25  GLUCOSE 111*  BUN 15  CREATININE 0.96  CALCIUM 8.3*  AST 26  ALT 14  ALKPHOS 73  BILITOT 0.6    estimated creatinine clearance is 34.2 mL/min (by C-G formula based on Cr of 0.96).  No results for input(s): TSH, T4TOTAL, T3FREE, THYROIDAB in the last 72 hours.  Invalid input(s):  FREET3  Coagulation profile  Recent Labs Lab 03/23/15 1250  INR 1.23    No results for input(s): DDIMER in the last 72 hours.  Cardiac Enzymes  Recent Labs Lab 03/22/15 1653  TROPONINI <0.03    Invalid input(s): POCBNP  Urinalysis    Component Value Date/Time   COLORURINE YELLOW 03/23/2015 1513   APPEARANCEUR CLEAR 03/23/2015 1513   LABSPEC 1.007 03/23/2015 1513   PHURINE 6.0 03/23/2015 1513   GLUCOSEU NEGATIVE 03/23/2015 1513   HGBUR NEGATIVE 03/23/2015 1513   BILIRUBINUR NEGATIVE 03/23/2015 1513   BILIRUBINUR Neg 09/11/2012 1022   KETONESUR NEGATIVE 03/23/2015 1513   PROTEINUR NEGATIVE 03/23/2015 1513   PROTEINUR Neg 09/11/2012 1022   UROBILINOGEN 0.2 09/11/2012 1022   UROBILINOGEN 1.0 05/17/2011 1959   NITRITE NEGATIVE 03/23/2015 1513   NITRITE Neg 09/11/2012 1022   LEUKOCYTESUR SMALL* 03/23/2015 1513    Imaging results:   Dg Chest 2 View  03/22/2015  CLINICAL DATA:  Altered mental status EXAM: CHEST  2 VIEW COMPARISON:  12/09/2013 FINDINGS: Cardiac shadow remains enlarged. Mild interstitial changes are again noted within both lungs without focal infiltrate. No sizable effusion is seen. No acute bony abnormality is noted. IMPRESSION: Chronic changes without acute abnormality. Electronically Signed   By: Inez Catalina M.D.   On: 03/22/2015 17:29   Ct Head Wo Contrast  03/22/2015  CLINICAL DATA:  Confusion and altered mental status EXAM: CT HEAD WITHOUT CONTRAST TECHNIQUE: Contiguous axial images were obtained from the base of the skull through the vertex without intravenous contrast. COMPARISON:  03/13/2011 FINDINGS: The bony calvarium is intact. Diffuse atrophic changes are noted. Encephalomalacia changes are seen in the right temporal parietal region new from the prior exam. Similar changes are noted in the posterior left parietal region. These are consistent with chronic ischemia. Lacunar infarcts are noted within the basal ganglia and foul mild bilaterally. These  are stable from the prior exam. No acute hemorrhage or acute infarction is seen. Fullness in the sella turcica is again noted consistent with a pituitary lesion. This is stable from the prior exam. IMPRESSION: Chronic ischemic changes increased from the prior exam.  Stable appearing pituitary lesion No acute abnormality noted. Electronically Signed   By: Inez Catalina M.D.   On: 03/22/2015 17:34   Mr Brain Wo Contrast  03/23/2015  CLINICAL DATA:  Acute onset of slurred speech and expressive aphasia after waking from a nap. EXAM: MRI HEAD WITHOUT CONTRAST TECHNIQUE: Multiplanar, multiecho pulse sequences of the brain and surrounding structures were obtained without intravenous contrast. COMPARISON:  CT head 03/22/2015.  Limited MR brain 03/13/2011. FINDINGS: There is significant motion degradation due to claustrophobia. In addition, no IV access could be obtained, and post infusion imaging was not performed. No restricted diffusion. No intra-axial mass lesion, acute hemorrhage, or extra-axial fluid. Advanced atrophy. Extensive chronic microvascular ischemic change. Large remote RIGHT MCA territory infarct affects the temporal occipital region on the RIGHT. Remote basal ganglia lacunar infarcts, greater on the LEFT. Remote LEFT parietal cortical infarct. Remote BILATERAL cerebellar infarcts. Flow voids are maintained. No chronic hemorrhage. A pituitary mass, eroding the sellar floor and extending to the RIGHT cavernous sinus, is redemonstrated. Approximate cross-sectional measurements are 13 x 15 x 19 mm. There is no definite chiasmatic compression. 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 Similar appearance to priors. IMPRESSION: No acute stroke is evident. No areas of restricted diffusion are observed. Advanced atrophy with extensive small vessel disease and sequelae of chronic infarction. Stable appearing pituitary mass, favor chromophobe adenoma. Electronically Signed   By: Staci Righter M.D.   On: 03/23/2015 15:29      EKG: (Independently reviewed) Atrial fibrillation with ventricular rate 71 bpm, QTC 476 ms, underlying left bundle branch block unchanged from previous as of 2/22 but new since last EKG 2013 (except for left bundle branch block)   Assessment & Plan  Principal Problem:   TIA /history prior CVA -MRI negative for acute CVA symptoms certainly concerning for TIA -Admit to telemetry/Obs -Neurology consulting -Echocardiogram and carotid duplex -Hemoglobin A1c and lipid panel -PT/OT/SLP evaluations -Suspect underlying and previously undiagnosed atrial fibrillation may be contributing factor and may have contributed to previous stroke given location (see MR results) -To start eliquis this admission so preadmission Plavix discontinued by cardiology and was started on low-dose aspirin -Continue statin  Active Problems:   New onset atrial fibrillation  -Unclear how long patient has been in atrial fibrillation but clearly rate controlled -Did not have atrial fibrillation as of last EKG 2013 -CHADVASc = 8 -Cardiology has been consulted -Start eliquis twice a day -Cardiology recommends patient call after discharge to be seen next week    CKD, stage III -Renal function stable and at baseline    Acute UTI -Resume Keflex-because of her chronic kidney disease and have given her a lower dosage of 250 mg by mouth every 8 hours -Follow up on urine culture from 2/22    HTN  -Current blood pressure moderately controlled -Continue preadmission carvedilol    DM2  -Was not on medications prior to admission -Follow CBGs/SSI    HLD  -Continue Zocor    Aortic stenosis -Has ausculble murmur -Follow-up on echocardiogram this admission    Pituitary macroadenoma  -Stable based on MRI    Impaired mobility/S/P AKA  -Follow from PT and OT evaluations -Patient was living independently at home    DVT Prophylaxis: Eliquis  Family Communication:  Son and daughter both sides   Code Status:  Full  code  Condition:  Stable  Discharge disposition: Anticipate discharge in a.m. pending PT OT evaluation  Time spent in minutes : 60      ELLIS,ALLISON L. ANP  on 03/23/2015 at [place] may contact me by going to [email or web address] - password [email or web address]  I am available from 7a-7p but please confirm I am on the schedule by going to Amion as above.   After 7p please contact night coverage person covering me after hours  [facility]

## 2015-03-23 NOTE — Telephone Encounter (Signed)
Caller name: Kieth Brightly Relationship to patient: Granddaughter Can be reached: [phone]   Reason for call: Granddaughter called stating that patient was seen in the ER yesterday and was told that she has possibly had a minor stroke. Doctors advised patient to be admitted into the hospital and patient refused. Her heartbeat was not regular and her BP was high. Informed granddaughter that she needed to be transferred to a Nurse at [facility], but the patient was sitting beside her and refused because she wanted to be seen by Dr. Birdie Riddle. Explained that Dr. Birdie Riddle was 100% booked for the day so she asked for a call back from the nurse. Plse adv

## 2015-03-23 NOTE — ED Notes (Signed)
Admitting states pt doesn't need to wear BP cuff.

## 2015-03-23 NOTE — ED Notes (Signed)
Pt was seen at [facility] last night for TIA.  She was told to come here to be admitted for tia (which resolved within 30 min), afib and a UTI.  Family was able to convince pt to come this am.

## 2015-03-23 NOTE — ED Provider Notes (Addendum)
CSN: [id number]     Arrival date & time 03/23/15  [id number] History   First MD Initiated Contact with Patient 03/23/15 1205     Chief Complaint  Patient presents with  . Transient Ischemic Attack     (Consider location/radiation/quality/duration/timing/severity/associated sxs/prior Treatment) Patient is a 82 y.o. female presenting with Acute Neurological Problem. The history is provided by the patient and a relative.  Cerebrovascular Accident This is a recurrent problem. The current episode started yesterday. The problem occurs rarely. The problem has been resolved. Pertinent negatives include no chest pain, no headaches and no shortness of breath. Nothing aggravates the symptoms. Nothing relieves the symptoms. She has tried nothing for the symptoms. The treatment provided no relief.    82 yo F  With a chief complaint of staring spell. She was sleeping and then was woken up by vacuuming they noted that she was staring straight away and was unresponsive.  This event lasted for about an hour. Patient does not remember anything that happened. Denies loss of bowel or bladder.  Past Medical History  Diagnosis Date  . Arthritis   . Diabetes mellitus   . Hypertension   . Heart murmur   . Chicken pox   . Fractured pelvis ([place]) 05/14/2011  . Stroke [facility])     feb 2013  . Pituitary macroadenoma ([place])   . Insomnia   . Hyperlipemia   . Hemiparesis, right ([place])   . Dysphagia   . Bilateral venous insufficiency   . Aspiration pneumonia ([place])   . Aneurysm of thoracic aorta ([place])   . Gangrene of foot [facility])    Past Surgical History  Procedure Laterality Date  . Abdominal hysterectomy    . Above knee leg amputation     Family History  Problem Relation Age of Onset  . Arthritis Mother   . Diabetes Sister   . Heart disease Brother   . Diabetes Brother    Social History  Substance Use Topics  . Smoking status: Never Smoker   . Smokeless tobacco: None  . Alcohol Use: No   OB History    No  data available     Review of Systems  Constitutional: Negative for fever and chills.  HENT: Negative for congestion and rhinorrhea.   Eyes: Negative for redness and visual disturbance.  Respiratory: Negative for shortness of breath and wheezing.   Cardiovascular: Negative for chest pain and palpitations.  Gastrointestinal: Negative for nausea and vomiting.  Genitourinary: Negative for dysuria and urgency.  Musculoskeletal: Negative for myalgias and arthralgias.  Skin: Negative for pallor and wound.  Neurological: Positive for speech difficulty. Negative for dizziness and headaches.      Allergies  Betadine; Equalyte; Neosporin; and Codeine  Home Medications   Prior to Admission medications   Medication Sig Start Date End Date Taking? Authorizing Provider  carvedilol (COREG) 25 MG tablet TAKE ONE TABLET BY MOUTH TWICE DAILY WITH MEALS 02/17/15  Yes Midge Minium, MD  clopidogrel (PLAVIX) 75 MG tablet TAKE ONE TABLET BY MOUTH ONCE DAILY WITH BREAKFAST 03/08/15  Yes Midge Minium, MD  furosemide (LASIX) 20 MG tablet TAKE ONE TABLET BY MOUTH ONCE DAILY AS NEEDED FOR  SWELLING 09/22/14  Yes Midge Minium, MD  NONFORMULARY OR COMPOUNDED ITEM Pt is in need of a Right AK Socket for her prosthetic. 06/02/13  Yes Midge Minium, MD  simvastatin (ZOCOR) 20 MG tablet TAKE ONE TABLET BY MOUTH ONCE DAILY AT 6 PM 03/08/15  Yes Midge Minium,  MD  cephALEXin (KEFLEX) 500 MG capsule Take 1 capsule (500 mg total) by mouth 4 (four) times daily. Patient not taking: Reported on 03/23/2015 03/22/15   Fredia Sorrow, MD   BP 116/92 mmHg  Pulse 84  Temp(Src) 98.1 F (36.7 C) (Oral)  Resp 20  SpO2 96% Physical Exam  Constitutional: She is oriented to person, place, and time. She appears well-developed and well-nourished. No distress.  HENT:  Head: Normocephalic and atraumatic.  Eyes: EOM are normal. Pupils are equal, round, and reactive to light.  Neck: Normal range of motion. Neck  supple.  Cardiovascular: Normal rate and regular rhythm.  Exam reveals no gallop and no friction rub.   No murmur heard. Pulmonary/Chest: Effort normal. She has no wheezes. She has no rales.  Abdominal: Soft. She exhibits no distension. There is no tenderness. There is no rebound and no guarding.  Musculoskeletal: She exhibits no edema or tenderness.  Neurological: She is alert and oriented to person, place, and time.  Skin: Skin is warm and dry. She is not diaphoretic.  Psychiatric: She has a normal mood and affect. Her behavior is normal.  Nursing note and vitals reviewed.   ED Course  Procedures (including critical care time) Labs Review Labs Reviewed  PROTIME-INR - Abnormal; Notable for the following:    Prothrombin Time 15.7 (*)    All other components within normal limits  URINALYSIS, ROUTINE W REFLEX MICROSCOPIC (NOT AT [facility]) - Abnormal; Notable for the following:    Leukocytes, UA SMALL (*)    All other components within normal limits  URINE MICROSCOPIC-ADD ON - Abnormal; Notable for the following:    Squamous Epithelial / LPF 0-5 (*)    All other components within normal limits  APTT  URINE RAPID DRUG SCREEN, HOSP PERFORMED  ETHANOL    Imaging Review Dg Chest 2 View  03/22/2015  CLINICAL DATA:  Altered mental status EXAM: CHEST  2 VIEW COMPARISON:  12/09/2013 FINDINGS: Cardiac shadow remains enlarged. Mild interstitial changes are again noted within both lungs without focal infiltrate. No sizable effusion is seen. No acute bony abnormality is noted. IMPRESSION: Chronic changes without acute abnormality. Electronically Signed   By: Inez Catalina M.D.   On: 03/22/2015 17:29   Ct Head Wo Contrast  03/22/2015  CLINICAL DATA:  Confusion and altered mental status EXAM: CT HEAD WITHOUT CONTRAST TECHNIQUE: Contiguous axial images were obtained from the base of the skull through the vertex without intravenous contrast. COMPARISON:  03/13/2011 FINDINGS: The bony calvarium is intact.  Diffuse atrophic changes are noted. Encephalomalacia changes are seen in the right temporal parietal region new from the prior exam. Similar changes are noted in the posterior left parietal region. These are consistent with chronic ischemia. Lacunar infarcts are noted within the basal ganglia and foul mild bilaterally. These are stable from the prior exam. No acute hemorrhage or acute infarction is seen. Fullness in the sella turcica is again noted consistent with a pituitary lesion. This is stable from the prior exam. IMPRESSION: Chronic ischemic changes increased from the prior exam. Stable appearing pituitary lesion No acute abnormality noted. Electronically Signed   By: Inez Catalina M.D.   On: 03/22/2015 17:34   Mr Brain Wo Contrast  03/23/2015  CLINICAL DATA:  Acute onset of slurred speech and expressive aphasia after waking from a nap. EXAM: MRI HEAD WITHOUT CONTRAST TECHNIQUE: Multiplanar, multiecho pulse sequences of the brain and surrounding structures were obtained without intravenous contrast. COMPARISON:  CT head 03/22/2015.  Limited MR  brain 03/13/2011. FINDINGS: There is significant motion degradation due to claustrophobia. In addition, no IV access could be obtained, and post infusion imaging was not performed. No restricted diffusion. No intra-axial mass lesion, acute hemorrhage, or extra-axial fluid. Advanced atrophy. Extensive chronic microvascular ischemic change. Large remote RIGHT MCA territory infarct affects the temporal occipital region on the RIGHT. Remote basal ganglia lacunar infarcts, greater on the LEFT. Remote LEFT parietal cortical infarct. Remote BILATERAL cerebellar infarcts. Flow voids are maintained. No chronic hemorrhage. A pituitary mass, eroding the sellar floor and extending to the RIGHT cavernous sinus, is redemonstrated. Approximate cross-sectional measurements are 13 x 15 x 19 mm. There is no definite chiasmatic compression. 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 Similar  appearance to priors. IMPRESSION: No acute stroke is evident. No areas of restricted diffusion are observed. Advanced atrophy with extensive small vessel disease and sequelae of chronic infarction. Stable appearing pituitary mass, favor chromophobe adenoma. Electronically Signed   By: Staci Righter M.D.   On: 03/23/2015 15:29   I have personally reviewed and evaluated these images and lab results as part of my medical decision-making.   EKG Interpretation   Date/Time:  Thursday March 23 2015 12:56:37 EST Ventricular Rate:  71 PR Interval:    QRS Duration: 144 QT Interval:  438 QTC Calculation: 476 R Axis:   45 Text Interpretation:  Atrial fibrillation Ventricular premature complex  Left bundle branch block No significant change since last tracing  Confirmed by Paislee Szatkowski MD, Quillian Quince [id number]) on 03/23/2015 3:31:07 PM      MDM   Final diagnoses:  Transient cerebral ischemia, unspecified transient cerebral ischemia type    82 yo F  With a chief complaint of a possible TIA. This was discussed with neurology. Recommend observation with full stroke workup. Patient was seen yesterday for the actual event. Had a negative CT head as well as labs that were unremarkable. MRI was performed today and was negative for acute stroke. Patient with new onset afib according to her.  Start heparin.   The patients results and plan were reviewed and discussed.   Any x-rays performed were independently reviewed by myself.   Differential diagnosis were considered with the presenting HPI.  Medications  LORazepam (ATIVAN) injection 0.25 mg (0.25 mg Intravenous Not Given 03/23/15 1512)    Filed Vitals:   03/23/15 1016 03/23/15 1200 03/23/15 1300 03/23/15 1600  BP: 145/79 160/92 180/90 116/92  Pulse: 108 100 86 84  Temp: 98.1 F (36.7 C)     TempSrc: Oral     Resp: 20 15 20 20   SpO2: 100% 100% 100% 96%    Final diagnoses:  Transient cerebral ischemia, unspecified transient cerebral ischemia type     Admission/ observation were discussed with the admitting physician, patient and/or family and they are comfortable with the plan.      Deno Etienne, DO 03/23/15 [place], DO 03/23/15 1640

## 2015-03-23 NOTE — Progress Notes (Signed)
Christina Pittman is an 81 y.o. female with a hx of multiple TIAs. Stroke, HTN, DM, AS, HL, PVD s/p Right leg amputation and aneurysm of thoracic aorta who is undergoing work up for TIAs. Found to have new onset afib at controlled rate. She lives by herself with 24/7 care. Wheelchair bound. Needs anticoagulation. Likely Eliquis BID per pharmacy. She is 82 years old. MD to see  Last echo 03/2011 with LV ef of 65-70%, moderate to severe AS.  Melvern Ramone, [place]

## 2015-03-23 NOTE — ED Notes (Addendum)
Pt continues to remove BP and pulse ox. Pt and family both stating "the doctor to me (her) that I (she) doesn't have to wear it because it bruising my(her) arm. Pt states "I am not going to keep wearing that thing." Family states that "the doctor said if she has to have one to wear the wrist one." Last BP on file is from 1700.   Pt and family also resfued to allow this RN or any RN to start an IV. Pt an family questioned this RN as to why she needed to start and IV. This RN informed them both that it was protocol fro all pts that are being admmitted to the hospital to have and IV started to assist with life saving measures. Pt and family still refused to allow this RN to start and IV. Floor RN informed of the current situation.

## 2015-03-23 NOTE — Telephone Encounter (Signed)
Called and spoke with Pt granddaughter, advised that per PCP she reviewed the ER note from last night and agrees 100% that with new onset A-Fib that the pt needs to be admitted. Pt family agreed and advised that they were taking pt to [facility] today.

## 2015-03-23 NOTE — Consult Note (Signed)
Requesting Physician: Dr. Greer Ee    Chief Complaint: TIA  HPI:                                                                                                                                         Christina Pittman is an 82 y.o. female brought to hospital for possible TIA in which speech became slurred and she had hard time recognizing family.  There is no family at bedside. Currently she is complaining of pain in her arms secondary to BP cuff. She states she is at hospital due to a "doctor in another hospital told her to come." she is unsure.  She is unable to state the day or year. She is able to state [place] and [place]. She knows she is in [place]. Most of history is obtained by chart.   Date last known well: Today Time last known well: Unable to determine tPA Given: No: resolved deficits with no lateralizing findings on exam.   Past Medical History  Diagnosis Date  . Arthritis   . Diabetes mellitus   . Hypertension   . Heart murmur   . Chicken pox   . Fractured pelvis ([place]) 05/14/2011  . Stroke [facility])     feb 2013  . Pituitary macroadenoma ([place])   . Insomnia   . Hyperlipemia   . Hemiparesis, right ([place])   . Dysphagia   . Bilateral venous insufficiency   . Aspiration pneumonia ([place])   . Aneurysm of thoracic aorta ([place])   . Gangrene of foot [facility])     Past Surgical History  Procedure Laterality Date  . Abdominal hysterectomy    . Above knee leg amputation      Family History  Problem Relation Age of Onset  . Arthritis Mother   . Diabetes Sister   . Heart disease Brother   . Diabetes Brother    Social History:  reports that she has never smoked. She does not have any smokeless tobacco history on file. She reports that she does not drink alcohol or use illicit drugs.  Allergies:  Allergies  Allergen Reactions  . Betadine [Povidone Iodine] Hives    Because of a history of documented adverse serious drug reaction;Medi Alert bracelet  is recommended  . Equalyte Hives    Medi Alert  bracelet  is recommended if verified . Drug not found in reference texts ([facility] or [facility])  . Neosporin [Neomycin-Polymyxin-Gramicidin] Hives    Medi Alert bracelet  is recommended  . Codeine Other (See Comments)    Intolerance     Medications:  No current facility-administered medications for this encounter.   Current Outpatient Prescriptions  Medication Sig Dispense Refill  . carvedilol (COREG) 25 MG tablet TAKE ONE TABLET BY MOUTH TWICE DAILY WITH MEALS 60 tablet 5  . clopidogrel (PLAVIX) 75 MG tablet TAKE ONE TABLET BY MOUTH ONCE DAILY WITH BREAKFAST 30 tablet 3  . furosemide (LASIX) 20 MG tablet TAKE ONE TABLET BY MOUTH ONCE DAILY AS NEEDED FOR  SWELLING 30 tablet 3  . NONFORMULARY OR COMPOUNDED ITEM Pt is in need of a Right AK Socket for her prosthetic. 1 each 0  . simvastatin (ZOCOR) 20 MG tablet TAKE ONE TABLET BY MOUTH ONCE DAILY AT 6 PM 30 tablet 6  . cephALEXin (KEFLEX) 500 MG capsule Take 1 capsule (500 mg total) by mouth 4 (four) times daily. (Patient not taking: Reported on 03/23/2015) 28 capsule 0     ROS:                                                                                                                                       History obtained from the patient  General ROS: negative for - chills, fatigue, fever, night sweats, weight gain or weight loss Psychological ROS: negative for - behavioral disorder, hallucinations, memory difficulties, mood swings or suicidal ideation Ophthalmic ROS: negative for - blurry vision, double vision, eye pain or loss of vision ENT ROS: negative for - epistaxis, nasal discharge, oral lesions, sore throat, tinnitus or vertigo Allergy and Immunology ROS: negative for - hives or itchy/watery eyes Hematological and Lymphatic ROS: negative for - bleeding problems, bruising or swollen lymph  nodes Endocrine ROS: negative for - galactorrhea, hair pattern changes, polydipsia/polyuria or temperature intolerance Respiratory ROS: negative for - cough, hemoptysis, shortness of breath or wheezing Cardiovascular ROS: negative for - chest pain, dyspnea on exertion, edema or irregular heartbeat Gastrointestinal ROS: negative for - abdominal pain, diarrhea, hematemesis, nausea/vomiting or stool incontinence Genito-Urinary ROS: negative for - dysuria, hematuria, incontinence or urinary frequency/urgency Musculoskeletal ROS: negative for - joint swelling or muscular weakness Neurological ROS: as noted in HPI Dermatological ROS: age related pigmentary changes and ecchymosis.   Neurologic Examination:                                                                                                      Blood pressure 160/92, pulse 100, temperature 98.1 F (36.7 C), temperature source Oral, resp. rate 15, SpO2 100 %.  HEENT-  Normocephalic, no lesions, without obvious abnormality.  Normal external  eye and conjunctiva.  Cardiovascular- S1, S2 normal, pulses palpable throughout   Lungs- no gross wheezing Extremities- Ecchymoses noted along all 4 extremities.   Neurological Examination Mental Status: Alert, not oriented to day, month or year, thought content appropriate.  Speech fluent without evidence of aphasia.  Able to follow simple commands but not multistep commands.  Difficulty with repetition. Able to identify fingers.  Cranial Nerves: II: Visual fields grossly normal, pupils equal, round, reactive to light and accommodation--cataract surgery bilaterally III,IV, VI: ptosis not present, extra-ocular motions intact bilaterally V,VII: smile symmetric, facial light touch sensation normal bilaterally VIII: hearing normal bilaterally IX,X: uvula rises symmetrically XI: bilateral shoulder shrug XII: midline tongue extension Motor: Right : Upper extremity   5/5    Left:     Upper extremity    5/5  Lower extremity   AKA 4/5    Lower extremity   4/5  Sensory: Pinprick and light touch intact throughout, bilaterally without extinction Deep Tendon Reflexes:  1+ left knee, trace at left ankle and 2+ left biceps 1+ left BR and 3+ right BR and Biceps Plantars: Right: downgoing   Left: downgoing Cerebellar: Normal finger-to-nose,  Gait: not tested  Lab Results: Basic Metabolic Panel:  Recent Labs Lab 03/22/15 1653  NA 139  K 4.1  CL 108  CO2 25  GLUCOSE 111*  BUN 15  CREATININE 0.96  CALCIUM 8.3*    Liver Function Tests:  Recent Labs Lab 03/22/15 1653  AST 26  ALT 14  ALKPHOS 73  BILITOT 0.6  PROT 6.5  ALBUMIN 3.2*   No results for input(s): LIPASE, AMYLASE in the last 168 hours. No results for input(s): AMMONIA in the last 168 hours.  CBC:  Recent Labs Lab 03/22/15 1653  WBC 8.3  NEUTROABS 4.7  HGB 12.6  HCT 37.7  MCV 90.8  PLT 190    Cardiac Enzymes:  Recent Labs Lab 03/22/15 1653  TROPONINI <0.03    Lipid Panel: No results for input(s): CHOL, TRIG, HDL, CHOLHDL, VLDL, LDLCALC in the last 168 hours.  CBG:  Recent Labs Lab 03/22/15 1614  GLUCAP 110*    Microbiology: Results for orders placed or performed during the hospital encounter of 03/22/15  Rapid strep screen (not at [facility])     Status: None   Collection Time: 03/22/15  4:40 PM  Result Value Ref Range Status   Streptococcus, Group A Screen (Direct) NEGATIVE NEGATIVE Final    Comment: (NOTE) A Rapid Antigen test may result negative if the antigen level in the sample is below the detection level of this test. The FDA has not cleared this test as a stand-alone test therefore the rapid antigen negative result has reflexed to a Group A Strep culture.     Coagulation Studies:  Recent Labs  03/23/15 1250  LABPROT 15.7*  INR 1.23    Imaging: Dg Chest 2 View  03/22/2015  CLINICAL DATA:  Altered mental status EXAM: CHEST  2 VIEW COMPARISON:  12/09/2013 FINDINGS: Cardiac  shadow remains enlarged. Mild interstitial changes are again noted within both lungs without focal infiltrate. No sizable effusion is seen. No acute bony abnormality is noted. IMPRESSION: Chronic changes without acute abnormality. Electronically Signed   By: Inez Catalina M.D.   On: 03/22/2015 17:29   Ct Head Wo Contrast  03/22/2015  CLINICAL DATA:  Confusion and altered mental status EXAM: CT HEAD WITHOUT CONTRAST TECHNIQUE: Contiguous axial images were obtained from the base of the skull through the vertex without intravenous  contrast. COMPARISON:  03/13/2011 FINDINGS: The bony calvarium is intact. Diffuse atrophic changes are noted. Encephalomalacia changes are seen in the right temporal parietal region new from the prior exam. Similar changes are noted in the posterior left parietal region. These are consistent with chronic ischemia. Lacunar infarcts are noted within the basal ganglia and foul mild bilaterally. These are stable from the prior exam. No acute hemorrhage or acute infarction is seen. Fullness in the sella turcica is again noted consistent with a pituitary lesion. This is stable from the prior exam. IMPRESSION: Chronic ischemic changes increased from the prior exam. Stable appearing pituitary lesion No acute abnormality noted. Electronically Signed   By: Inez Catalina M.D.   On: 03/22/2015 17:34    History and examination documented by Etta Quill PA-C, [facility], [phone] 03/23/2015, 1:17 PM   Assessment: 82 y.o. female  1. Acute onset of slurred speech with confusion. No lateralizing findings on exam. Risks of IV tPA significantly outweigh potential benefits.  2. History of stroke in 2013. On Zocor and Plavix as outpatient.  3. Stroke Risk Factors - diabetes mellitus, hyperlipidemia and hypertension  Recommendations: 1. MRI brain. If positive for stroke, obtain remainder of stroke work up.  2. Evaluation for toxic, metabolic or infectious etiology for her AMS.  3. May  need outpatient dementia evaluation if MMSE following stabilization is abnormal  Kerney [place]be, MD

## 2015-03-23 NOTE — ED Notes (Signed)
Family states pt had several TIA's a year leading up to her stroke in 2013. Pt family states she has had 2 "episodes" in the past 2 days where her speech became slurred and she didn't recognize her family.

## 2015-03-23 NOTE — Consult Note (Signed)
ANTICOAGULATION CONSULT NOTE - Initial Consult  Pharmacy Consult for heparin Indication: atrial fibrillation  Patient Measurements:   Heparin Dosing Weight: 72.6 kg  Vital Signs: Temp: 98.1 F (36.7 C) (02/23 1016) Temp Source: Oral (02/23 1016) BP: 116/92 mmHg (02/23 1600) Pulse Rate: 84 (02/23 1600)  Labs:  Recent Labs  03/22/15 1653 03/23/15 1250  HGB 12.6  --   HCT 37.7  --   PLT 190  --   APTT  --  31  LABPROT  --  15.7*  INR  --  1.23  CREATININE 0.96  --   TROPONINI <0.03  --     Estimated Creatinine Clearance: 34.2 mL/min (by C-G formula based on Cr of 0.96).   Assessment: 82 YO w/ possible TIA. Pt has a history of CVA in 2013. Is on Plavix but no anticoag PTA. The pt has new onset afib. CBC is wnl.  Goal of Therapy:  Heparin level 0.3-0.7 units/ml Monitor platelets by anticoagulation protocol: Yes   Plan:  Give 3600 units bolus x 1 Start heparin infusion at 1000 units/hr Check anti-Xa level in 8 hours and daily while on heparin Continue to monitor H&H and platelets  Joya San, PharmD Clinical Pharmacy Resident Pager # [phone] 03/23/2015 4:57 PM   **Addendum  Heparin consult changed to Eliquis for atrial fibrillation. Heparin was not administered. SCr was 0.96 yesterday at [facility], which appears to be at baseline.  Start Eliquis 5 mg PO BID. Monitor s/sx bleeding.

## 2015-03-23 NOTE — ED Notes (Signed)
Pt called out to use bathroom. This RN attempted to assist pt with bedpan. Pt refused help of this RN and requested assistance of a female staff member. This RN informed pt that it would take a moment to locate an available staff member. Pt agreed to waiting.

## 2015-03-24 ENCOUNTER — Inpatient Hospital Stay (HOSPITAL_COMMUNITY): Payer: Medicare Other

## 2015-03-24 DIAGNOSIS — N183 Chronic kidney disease, stage 3 (moderate): Secondary | ICD-10-CM | POA: Diagnosis not present

## 2015-03-24 DIAGNOSIS — I350 Nonrheumatic aortic (valve) stenosis: Secondary | ICD-10-CM | POA: Diagnosis not present

## 2015-03-24 DIAGNOSIS — G458 Other transient cerebral ischemic attacks and related syndromes: Secondary | ICD-10-CM | POA: Diagnosis not present

## 2015-03-24 DIAGNOSIS — N390 Urinary tract infection, site not specified: Secondary | ICD-10-CM | POA: Diagnosis not present

## 2015-03-24 LAB — LIPID PANEL
CHOLESTEROL: 96 mg/dL (ref 0–200)
HDL: 29 mg/dL — AB (ref >40)
LDL Cholesterol: 56 mg/dL (ref 0–99)
Total CHOL/HDL Ratio: 3.3 RATIO
Triglycerides: 55 mg/dL (ref <150)
VLDL: 11 mg/dL (ref 0–40)

## 2015-03-24 MED ORDER — APIXABAN 5 MG PO TABS: 5.0000 mg | ORAL_TABLET | Freq: Two times a day (BID) | ORAL | Status: DC

## 2015-03-24 MED ORDER — CEPHALEXIN 500 MG PO CAPS
500.0000 mg | ORAL_CAPSULE | Freq: Four times a day (QID) | ORAL | Status: DC
Start: 2015-03-24 — End: 2016-01-10

## 2015-03-24 MED ORDER — APIXABAN 5 MG PO TABS
5.0000 mg | ORAL_TABLET | Freq: Two times a day (BID) | ORAL | Status: DC
  Administered 2015-03-24: 5 mg via ORAL
  Filled 2015-03-24: qty 1

## 2015-03-24 MED ORDER — ASPIRIN 81 MG PO CHEW: 81.0000 mg | CHEWABLE_TABLET | Freq: Every day | ORAL | Status: DC

## 2015-03-24 NOTE — Progress Notes (Signed)
Pt has been refusing her [place] checked, lab test and medicine this morning, son tried to convinced her she still refused will continue to monitor

## 2015-03-24 NOTE — Evaluation (Signed)
Physical Therapy Evaluation & discharge Patient Details Name: Christina Pittman MRN: [id number] DOB: Christina Pittman Today's Date: 03/24/2015   History of Present Illness  82 year old female with history of peripheral vascular disease, status post right AKA, stroke, chronic kidney disease, hypertension, diabetes, dyslipidemia, pituitary macroadenoma, she brought to the hospital for possible TIA, had an episode of slurred speech, confusion, MRI brain with no evidence of acute CVA, admitted for CVA workup, as well diagnosed with new onset A. fib, seen by cardiology, as well being treated for UTI.  Clinical Impression  Pt transferring at MOD/MAX A level.  Pt has 24 hour care and normally transfers with MIN A.  Recommend HHPT.  Son present and he and pt agree with HHPT recommendation.  Pt scheduled to dc today and will sign off. If pt does not end up dc, please re-order PT services.    Follow Up Recommendations Home health PT    Equipment Recommendations  None recommended by PT    Recommendations for Other Services       Precautions / Restrictions Precautions Precautions: Fall Precaution Comments: R AKA Restrictions Weight Bearing Restrictions: No      Mobility  Bed Mobility Overal bed mobility: Needs Assistance Bed Mobility: Supine to Sit     Supine to sit: Mod assist     General bed mobility comments: Bed pad to get hips turned.  Transfers Overall transfer level: Needs assistance Equipment used: 1 person hand held assist Transfers: Stand Pivot Transfers   Stand pivot transfers: Max assist          Ambulation/Gait                Stairs            Wheelchair Mobility    Modified Rankin (Stroke Patients Only)       Balance                                             Pertinent Vitals/Pain Pain Assessment: No/denies pain    Home Living Family/patient expects to be discharged to:: Private residence Living Arrangements:  Children;Non-relatives/Friends Available Help at Discharge: Family;Personal care attendant;Available 24 hours/day Type of Home: House Home Access: Ramped entrance       Home Equipment: Wheelchair - manual;Wheelchair - power;Grab bars - toilet;Grab bars - tub/shower;Shower seat      Prior Function Level of Independence: Needs assistance   Gait / Transfers Assistance Needed: SPT with A, but pt does more than 50%.  propels w/c with B UE           Hand Dominance        Extremity/Trunk Assessment   Upper Extremity Assessment: Generalized weakness           Lower Extremity Assessment: Generalized weakness;RLE deficits/detail RLE Deficits / Details: R AKA       Communication      Cognition Arousal/Alertness: Awake/alert Behavior During Therapy: Flat affect Overall Cognitive Status: Within Functional Limits for tasks assessed                      General Comments      Exercises        Assessment/Plan    PT Assessment All further PT needs can be met in the next venue of care;Patent does not need any further PT services  PT Diagnosis Difficulty walking  PT Problem List Decreased strength;Decreased balance;Decreased mobility  PT Treatment Interventions     PT Goals (Current goals can be found in the Care Plan section) Acute Rehab PT Goals Patient Stated Goal: go home PT Goal Formulation: All assessment and education complete, DC therapy    Frequency     Barriers to discharge        Co-evaluation               End of Session Equipment Utilized During Treatment: Gait belt Activity Tolerance: Patient tolerated treatment well;Patient limited by fatigue Patient left: in chair;with call bell/phone within reach;with family/visitor present Nurse Communication: Mobility status    Functional Assessment Tool Used: clinical judgement and objective findings Functional Limitation: Changing and maintaining body position Changing and Maintaining  Body Position Current Status (B2841): At least 40 percent but less than 60 percent impaired, limited or restricted Changing and Maintaining Body Position Goal Status (L2440): At least 40 percent but less than 60 percent impaired, limited or restricted Changing and Maintaining Body Position Discharge Status [phone]): At least 40 percent but less than 60 percent impaired, limited or restricted    Time: 0945-1007 PT Time Calculation (min) (ACUTE ONLY): 22 min   Charges:   PT Evaluation $PT Eval Low Complexity: 1 Procedure     PT G Codes:   PT G-Codes **NOT FOR INPATIENT CLASS** Functional Assessment Tool Used: clinical judgement and objective findings Functional Limitation: Changing and maintaining body position Changing and Maintaining Body Position Current Status (Z3664): At least 40 percent but less than 60 percent impaired, limited or restricted Changing and Maintaining Body Position Goal Status (Q0347): At least 40 percent but less than 60 percent impaired, limited or restricted Changing and Maintaining Body Position Discharge Status [phone]): At least 40 percent but less than 60 percent impaired, limited or restricted    Christina Pittman 03/24/2015, 10:21 AM

## 2015-03-24 NOTE — Care Management Note (Signed)
Case Management Note  Patient Details  Name: Shawnae Sadowski MRN: [id number] Date of Birth: 1919/05/29  Subjective/Objective:                 Spoke with patient and family at the bedside. Patient lives at home with son who declines further DME, has walker and wheelchair and bathrooms with grab bars. Patient started on Eliquis, gave 30 day card. Family instructed to follow up with PCP or cardiologist for authorization.    Action/Plan:  Referral made to [facility] PT. Expected Discharge Date:                  Expected Discharge Plan:  [place]  In-House Referral:     Discharge planning Services  CM Consult  Post Acute Care Choice:  Home Health Choice offered to:  Patient, Adult Children  DME Arranged:    DME Agency:     HH Arranged:  PT [place]:  [facility]  Status of Service:  Completed, signed off  Medicare Important Message Given:    Date Medicare IM Given:    Medicare IM give by:    Date Additional Medicare IM Given:    Additional Medicare Important Message give by:     If discussed at [place] of Stay Meetings, dates discussed:    Additional Comments:  Carles Collet, RN 03/24/2015, 11:09 AM

## 2015-03-24 NOTE — Care Management Obs Status (Signed)
[place] NOTIFICATION   Patient Details  Name: Christina Pittman MRN: [id number] Date of Birth: 05-28-1919   Medicare Observation Status Notification Given:  Yes    Carles Collet, RN 03/24/2015, 11:08 AM

## 2015-03-24 NOTE — Discharge Instructions (Signed)
Follow with Katherine Tabori, MD in 5-7 days ° °Please get a complete blood count and chemistry panel checked by your Primary MD at your next visit, and again as instructed by your Primary MD. Please get your medications reviewed and adjusted by your Primary MD. ° °Please request your Primary MD to go over all Hospital Tests and Procedure/Radiological results at the follow up, please get all Hospital records sent to your Prim MD by signing hospital release before you go home. ° °If you had Pneumonia of Lung problems at the Hospital: °Please get a 2 view Chest X ray done in 6-8 weeks after hospital discharge or sooner if instructed by your Primary MD. ° °If you have Congestive Heart Failure: °Please call your Cardiologist or Primary MD anytime you have any of the following symptoms:  °1) 3 pound weight gain in 24 hours or 5 pounds in 1 week  °2) shortness of breath, with or without a dry hacking cough  °3) swelling in the hands, feet or stomach  °4) if you have to sleep on extra pillows at night in order to breathe ° °Follow cardiac low salt diet and 1.5 lit/day fluid restriction. ° °If you have diabetes °Accuchecks 4 times/day, Once in AM empty stomach and then before each meal. °Log in all results and show them to your primary doctor at your next visit. °If any glucose reading is under 80 or above 300 call your primary MD immediately. ° °If you have Seizure/Convulsions/Epilepsy: °Please do not drive, operate heavy machinery, participate in activities at heights or participate in high speed sports until you have seen by Primary MD or a Neurologist and advised to do so again. ° °If you had Gastrointestinal Bleeding: °Please ask your Primary MD to check a complete blood count within one week of discharge or at your next visit. Your endoscopic/colonoscopic biopsies that are pending at the time of discharge, will also need to followed by your Primary MD. ° °Get Medicines reviewed and adjusted. °Please take all your  medications with you for your next visit with your Primary MD ° °Please request your Primary MD to go over all hospital tests and procedure/radiological results at the follow up, please ask your Primary MD to get all Hospital records sent to his/her office. ° °If you experience worsening of your admission symptoms, develop shortness of breath, life threatening emergency, suicidal or homicidal thoughts you must seek medical attention immediately by calling 911 or calling your MD immediately  if symptoms less severe. ° °You must read complete instructions/literature along with all the possible adverse reactions/side effects for all the Medicines you take and that have been prescribed to you. Take any new Medicines after you have completely understood and accpet all the possible adverse reactions/side effects.  ° °Do not drive or operate heavy machinery when taking Pain medications.  ° °Do not take more than prescribed Pain, Sleep and Anxiety Medications ° °Special Instructions: If you have smoked or chewed Tobacco  in the last 2 yrs please stop smoking, stop any regular Alcohol  and or any Recreational drug use. ° °Wear Seat belts while driving. ° °Please note °You were cared for by a hospitalist during your hospital stay. If you have any questions about your discharge medications or the care you received while you were in the hospital after you are discharged, you can call the unit and asked to speak with the hospitalist on call if the hospitalist that took care of you is not available. Once   you are discharged, your primary care physician will handle any further medical issues. Please note that NO REFILLS for any discharge medications will be authorized once you are discharged, as it is imperative that you return to your primary care physician (or establish a relationship with a primary care physician if you do not have one) for your aftercare needs so that they can reassess your need for medications and monitor your  lab values. ° °You can reach the hospitalist office at phone [phone] or fax [phone] °  °If you do not have a primary care physician, you can call [phone] for a physician referral. ° °Activity: As tolerated with Full fall precautions use walker/cane & assistance as needed ° °Diet: regular ° °Disposition Home ° ° °

## 2015-03-24 NOTE — Progress Notes (Signed)
PT Note: Late entry:  03/24/15 1024  Modified Rankin (Stroke Patients Only)  Pre-Morbid Rankin Score 3  Modified Rankin 3   Christina Pittman LUBECK

## 2015-03-24 NOTE — Progress Notes (Signed)
Pt admitted last night from ED per stretcher accompanied by a tech, pt and family introduced to staff, admission parkage given to pt and fall prevention precaustion discuss with pt, call light within reach and pt demonstrated how to use, treatment started and will continue to monitor

## 2015-03-24 NOTE — Progress Notes (Signed)
Patient discharge teaching given, including activity, diet, follow-up appoints, and medications. Patient verbalized understanding of all discharge instructions. IV access was d/c'd. Vitals are stable. Skin is intact except as charted in most recent assessments. Pt to be escorted out by NT, to be driven home by family. 

## 2015-03-25 DIAGNOSIS — Z89611 Acquired absence of right leg above knee: Secondary | ICD-10-CM | POA: Diagnosis not present

## 2015-03-25 DIAGNOSIS — E785 Hyperlipidemia, unspecified: Secondary | ICD-10-CM | POA: Diagnosis not present

## 2015-03-25 DIAGNOSIS — I4891 Unspecified atrial fibrillation: Secondary | ICD-10-CM | POA: Diagnosis not present

## 2015-03-25 DIAGNOSIS — I739 Peripheral vascular disease, unspecified: Secondary | ICD-10-CM | POA: Diagnosis not present

## 2015-03-25 DIAGNOSIS — E1122 Type 2 diabetes mellitus with diabetic chronic kidney disease: Secondary | ICD-10-CM | POA: Diagnosis not present

## 2015-03-25 DIAGNOSIS — M150 Primary generalized (osteo)arthritis: Secondary | ICD-10-CM | POA: Diagnosis not present

## 2015-03-25 DIAGNOSIS — Z8673 Personal history of transient ischemic attack (TIA), and cerebral infarction without residual deficits: Secondary | ICD-10-CM | POA: Diagnosis not present

## 2015-03-25 DIAGNOSIS — N390 Urinary tract infection, site not specified: Secondary | ICD-10-CM | POA: Diagnosis not present

## 2015-03-25 DIAGNOSIS — I129 Hypertensive chronic kidney disease with stage 1 through stage 4 chronic kidney disease, or unspecified chronic kidney disease: Secondary | ICD-10-CM | POA: Diagnosis not present

## 2015-03-25 DIAGNOSIS — N183 Chronic kidney disease, stage 3 (moderate): Secondary | ICD-10-CM | POA: Diagnosis not present

## 2015-03-25 LAB — CULTURE, GROUP A STREP (THRC)

## 2015-03-25 LAB — URINE CULTURE: Culture: >=100000

## 2015-03-25 LAB — HEMOGLOBIN A1C
HEMOGLOBIN A1C: 6.2 % — AB (ref 4.8–5.6)
MEAN PLASMA GLUCOSE: 131 mg/dL

## 2015-03-25 NOTE — Discharge Summary (Signed)
Physician Discharge Summary  Analyssa Pittman WUJ:[id number] DOB: 1919/02/04 DOA: 03/23/2015  PCP: Christina Rhymes, MD  Admit date: 03/23/2015 Discharge date: 03/25/2015  Time spent: > 30 minutes  Recommendations for Outpatient Follow-up:  1. Follow up with Dr. Rennis Pittman as scheduled   Discharge Diagnoses:  Principal Problem:   TIA (transient ischemic attack) Active Problems:   HTN (hypertension)   DM2 (diabetes mellitus, type 2) (HCC)   HLD (hyperlipidemia)   Pituitary macroadenoma (HCC)   Impaired mobility   S/P AKA (above knee amputation) (HCC)   New onset atrial fibrillation (HCC)   Aortic stenosis   CKD (chronic kidney disease), stage III   Acute UTI  Discharge Condition: stable  Diet recommendation: regular  There were no vitals filed for this visit.  History of present illness:  See H&P, Labs, Consult and Test reports for all details in brief, patient is a 82 year old female with history of peripheral vascular disease, status post right AKA, stroke, chronic kidney disease, hypertension, diabetes, dyslipidemia, pituitary macroadenoma, she brought to the hospital for possible TIA, had an episode of slurred speech, confusion.  Hospital Course:  TIA - patient was admitted to the hospital for concern for CVA. MRI negative for acute stroke. Neurology consulted and recommended no further CVA workup if MRI is negative.  New onset A fib - rate controlled, started on Eliquis. Plavix discontinued. Cardiology evaluated, recommending outpatient vs inpatient echo, patient and her family very insistent to go home ASAP, would prefer to have 2D echo as an outpatient. She has a follow up appointment with Dr. Rennis Pittman in 3 days. Encouraged to keep it. UTI - on Keflex, continue per sensitivities HTN - on Coreg Ao stenosis - 2D echo as outpatient Pituitary macroadenoma - stable based on MRI S/p AKA - HHPT  Procedures:  None    Consultations:  Neurology   Cardiology   Discharge  Exam: Filed Vitals:   03/23/15 2015 03/23/15 2030 03/23/15 2203 03/24/15 1053  BP:   145/93 115/97  Pulse:   85 77  Temp:   97.5 F (36.4 C)   TempSrc:   Oral   Resp: 19 17 18    SpO2:   94%    General: NAD Cardiovascular: RRR Respiratory: CTA biL  Discharge Instructions Activity:  As tolerated   Get Medicines reviewed and adjusted: Please take all your medications with you for your next visit with your Primary MD  Please request your Primary MD to go over all hospital tests and procedure/radiological results at the follow up, please ask your Primary MD to get all Hospital records sent to his/her office.  If you experience worsening of your admission symptoms, develop shortness of breath, life threatening emergency, suicidal or homicidal thoughts you must seek medical attention immediately by calling 911 or calling your MD immediately if symptoms less severe.  You must read complete instructions/literature along with all the possible adverse reactions/side effects for all the Medicines you take and that have been prescribed to you. Take any new Medicines after you have completely understood and accpet all the possible adverse reactions/side effects.   Do not drive when taking Pain medications.   Do not take more than prescribed Pain, Sleep and Anxiety Medications  Special Instructions: If you have smoked or chewed Tobacco in the last 2 yrs please stop smoking, stop any regular Alcohol and or any Recreational drug use.  Wear Seat belts while driving.  Please note  You were cared for by a hospitalist during your hospital stay. Once  you are discharged, your primary care physician will handle any further medical issues. Please note that NO REFILLS for any discharge medications will be authorized once you are discharged, as it is imperative that you return to your primary care physician (or establish a relationship with a primary care physician if you do not have one) for your  aftercare needs so that they can reassess your need for medications and monitor your lab values.    Medication List    STOP taking these medications        clopidogrel 75 MG tablet  Commonly known as:  PLAVIX      TAKE these medications        apixaban 5 MG Tabs tablet  Commonly known as:  ELIQUIS  Take 1 tablet (5 mg total) by mouth 2 (two) times daily.     aspirin 81 MG chewable tablet  Chew 1 tablet (81 mg total) by mouth daily.     carvedilol 25 MG tablet  Commonly known as:  COREG  TAKE ONE TABLET BY MOUTH TWICE DAILY WITH MEALS     cephALEXin 500 MG capsule  Commonly known as:  KEFLEX  Take 1 capsule (500 mg total) by mouth 4 (four) times daily.     furosemide 20 MG tablet  Commonly known as:  LASIX  TAKE ONE TABLET BY MOUTH ONCE DAILY AS NEEDED FOR  SWELLING     NONFORMULARY OR COMPOUNDED ITEM  Pt is in need of a Right AK Socket for her prosthetic.     simvastatin 20 MG tablet  Commonly known as:  ZOCOR  TAKE ONE TABLET BY MOUTH ONCE DAILY AT 6 PM           Follow-up Information    Follow up with Christina Rhymes, MD. Schedule an appointment as soon as possible for a visit in 1 week.   Specialty:  Family Medicine   Contact information:   [address] [place] [address] [phone]       Follow up with Christina Nose, MD. Schedule an appointment as soon as possible for a visit in 2 weeks.   Specialty:  Cardiology   Contact information:   [address] [place] [address] [phone]       Follow up with [facility].   Why:  HH PT. They will call in the next 1-2 days to set up your first home health visit.   Contact information:   [address] [place] [address] [phone]       The results of significant diagnostics from this hospitalization (including imaging, microbiology, ancillary and laboratory) are listed below for reference.    Significant Diagnostic Studies: Dg  Chest 2 View  03/22/2015  CLINICAL DATA:  Altered mental status EXAM: CHEST  2 VIEW COMPARISON:  12/09/2013 FINDINGS: Cardiac shadow remains enlarged. Mild interstitial changes are again noted within both lungs without focal infiltrate. No sizable effusion is seen. No acute bony abnormality is noted. IMPRESSION: Chronic changes without acute abnormality. Electronically Signed   By: Alcide Clever M.D.   On: 03/22/2015 17:29   Ct Head Wo Contrast  03/22/2015  CLINICAL DATA:  Confusion and altered mental status EXAM: CT HEAD WITHOUT CONTRAST TECHNIQUE: Contiguous axial images were obtained from the base of the skull through the vertex without intravenous contrast. COMPARISON:  03/13/2011 FINDINGS: The bony calvarium is intact. Diffuse atrophic changes are noted. Encephalomalacia changes are seen in the right  temporal parietal region new from the prior exam. Similar changes are noted in the posterior left parietal region. These are consistent with chronic ischemia. Lacunar infarcts are noted within the basal ganglia and foul mild bilaterally. These are stable from the prior exam. No acute hemorrhage or acute infarction is seen. Fullness in the sella turcica is again noted consistent with a pituitary lesion. This is stable from the prior exam. IMPRESSION: Chronic ischemic changes increased from the prior exam. Stable appearing pituitary lesion No acute abnormality noted. Electronically Signed   By: Alcide Clever M.D.   On: 03/22/2015 17:34   Mr Brain Wo Contrast  03/23/2015  CLINICAL DATA:  Acute onset of slurred speech and expressive aphasia after waking from a nap. EXAM: MRI HEAD WITHOUT CONTRAST TECHNIQUE: Multiplanar, multiecho pulse sequences of the brain and surrounding structures were obtained without intravenous contrast. COMPARISON:  CT head 03/22/2015.  Limited MR brain 03/13/2011. FINDINGS: There is significant motion degradation due to claustrophobia. In addition, no IV access could be obtained, and post  infusion imaging was not performed. No restricted diffusion. No intra-axial mass lesion, acute hemorrhage, or extra-axial fluid. Advanced atrophy. Extensive chronic microvascular ischemic change. Large remote RIGHT MCA territory infarct affects the temporal occipital region on the RIGHT. Remote basal ganglia lacunar infarcts, greater on the LEFT. Remote LEFT parietal cortical infarct. Remote BILATERAL cerebellar infarcts. Flow voids are maintained. No chronic hemorrhage. A pituitary mass, eroding the sellar floor and extending to the RIGHT cavernous sinus, is redemonstrated. Approximate cross-sectional measurements are 13 x 15 x 19 mm. There is no definite chiasmatic compression. 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 Similar appearance to priors. IMPRESSION: No acute stroke is evident. No areas of restricted diffusion are observed. Advanced atrophy with extensive small vessel disease and sequelae of chronic infarction. Stable appearing pituitary mass, favor chromophobe adenoma. Electronically Signed   By: Elsie Stain M.D.   On: 03/23/2015 15:29    Microbiology: Recent Results (from the past 240 hour(s))  Rapid strep screen (not at [facility])     Status: None   Collection Time: 03/22/15  4:40 PM  Result Value Ref Range Status   Streptococcus, Group A Screen (Direct) NEGATIVE NEGATIVE Final    Comment: (NOTE) A Rapid Antigen test may result negative if the antigen level in the sample is below the detection level of this test. The FDA has not cleared this test as a stand-alone test therefore the rapid antigen negative result has reflexed to a Group A Strep culture.   Culture, group A strep     Status: None   Collection Time: 03/22/15  4:40 PM  Result Value Ref Range Status   Specimen Description THROAT  Final   Special Requests NONE Reflexed from [id number]  Final   Culture   Final    NO GROUP A STREP (S.PYOGENES) ISOLATED Performed at [facility]    Report Status 03/25/2015 FINAL  Final  Urine  culture     Status: None   Collection Time: 03/22/15  6:37 PM  Result Value Ref Range Status   Specimen Description URINE, CLEAN CATCH  Final   Special Requests NONE  Final   Culture   Final    >=100,000 COLONIES/mL ESCHERICHIA COLI Performed at [facility]    Report Status 03/25/2015 FINAL  Final   Organism ID, Bacteria ESCHERICHIA COLI  Final      Susceptibility   Escherichia coli - MIC*    AMPICILLIN >=32 RESISTANT Resistant     CEFAZOLIN 16  SENSITIVE Sensitive     CEFTRIAXONE <=1 SENSITIVE Sensitive     CIPROFLOXACIN <=0.25 SENSITIVE Sensitive     GENTAMICIN <=1 SENSITIVE Sensitive     IMIPENEM <=0.25 SENSITIVE Sensitive     NITROFURANTOIN <=16 SENSITIVE Sensitive     TRIMETH/SULFA <=20 SENSITIVE Sensitive     AMPICILLIN/SULBACTAM >=32 RESISTANT Resistant     PIP/TAZO <=4 SENSITIVE Sensitive     * >=100,000 COLONIES/mL ESCHERICHIA COLI   Labs: Basic Metabolic Panel:  Recent Labs Lab 03/22/15 1653  NA 139  K 4.1  CL 108  CO2 25  GLUCOSE 111*  BUN 15  CREATININE 0.96  CALCIUM 8.3*   Liver Function Tests:  Recent Labs Lab 03/22/15 1653  AST 26  ALT 14  ALKPHOS 73  BILITOT 0.6  PROT 6.5  ALBUMIN 3.2*   CBC:  Recent Labs Lab 03/22/15 1653  WBC 8.3  NEUTROABS 4.7  HGB 12.6  HCT 37.7  MCV 90.8  PLT 190   Cardiac Enzymes:  Recent Labs Lab 03/22/15 1653  TROPONINI <0.03   CBG:  Recent Labs Lab 03/22/15 1614  GLUCAP 110*    Signed:  Jorgina Binning  [facility] 03/25/2015, 2:01 PM

## 2015-03-27 ENCOUNTER — Ambulatory Visit (INDEPENDENT_AMBULATORY_CARE_PROVIDER_SITE_OTHER): Payer: Medicare Other | Admitting: Internal Medicine

## 2015-03-27 ENCOUNTER — Encounter: Payer: Self-pay | Admitting: Internal Medicine

## 2015-03-27 ENCOUNTER — Telehealth (HOSPITAL_COMMUNITY): Payer: Self-pay

## 2015-03-27 ENCOUNTER — Telehealth: Payer: Self-pay | Admitting: Family Medicine

## 2015-03-27 VITALS — HR 71 | Ht 66.0 in

## 2015-03-27 DIAGNOSIS — I350 Nonrheumatic aortic (valve) stenosis: Secondary | ICD-10-CM

## 2015-03-27 DIAGNOSIS — G458 Other transient cerebral ischemic attacks and related syndromes: Secondary | ICD-10-CM | POA: Diagnosis not present

## 2015-03-27 DIAGNOSIS — N183 Chronic kidney disease, stage 3 unspecified: Secondary | ICD-10-CM

## 2015-03-27 DIAGNOSIS — I712 Thoracic aortic aneurysm, without rupture, unspecified: Secondary | ICD-10-CM

## 2015-03-27 DIAGNOSIS — I4891 Unspecified atrial fibrillation: Secondary | ICD-10-CM

## 2015-03-27 DIAGNOSIS — Z8673 Personal history of transient ischemic attack (TIA), and cerebral infarction without residual deficits: Secondary | ICD-10-CM

## 2015-03-27 DIAGNOSIS — Z89611 Acquired absence of right leg above knee: Secondary | ICD-10-CM | POA: Diagnosis not present

## 2015-03-27 MED ORDER — CARVEDILOL 12.5 MG PO TABS: 12.5000 mg | ORAL_TABLET | Freq: Two times a day (BID) | ORAL | Status: DC

## 2015-03-27 NOTE — Patient Instructions (Signed)
Your physician has recommended you make the following change in your medication: DECREASE carvedilol to 12.5mg  twice daily -- can cut 25mg  in half -- new prescription sent to pharmacy   Your physician has requested that you have an echocardiogram @ [address]. Echocardiography is a painless test that uses sound waves to create images of your heart. It provides your doctor with information about the size and shape of your heart and how well your heart's chambers and valves are working. This procedure takes approximately one hour. There are no restrictions for this procedure.  Your physician recommends that you schedule a follow-up appointment in: [place] with Dr. Debara Pickett

## 2015-03-27 NOTE — Telephone Encounter (Signed)
Caller name: Darnelle  -- PT at [facility]   Relationship to patient:  Phyical Therapist   Can be reached: [phone]  Reason for call: verbal orders for OT to increase independence with ADL and also speech therapy orders because pt states that she is getting strangled with speaking .

## 2015-03-27 NOTE — Telephone Encounter (Signed)
Orders given.  

## 2015-03-27 NOTE — Progress Notes (Signed)
Christina Kitchen    OFFICE NOTE  Chief Complaint:  Hospital follow-up  Primary Care Physician: Annye Asa, MD  HPI:  Christina Pittman is a 82 yo female with multiple comorbidities, including severe PAD and right AKA amputation, she also has moderate to severe AS. Not likely a candidate for surgery in the future. Found to be in new a-fib with CVR, RBBB, rate-controlled. D/w medicine service. Recommend starting Eliquis 5 mg BID. D/c plavix and use low dose ASA 81 mg daily for PAD. She was also treated for concomitant UTI. Family today says that her mental status has improved significantly. Blood pressure was difficult to obtain in the office and we were able to get a blood pressure of 90 over palp with a Doppler. She is compliant with Eliquis and is not noticed any new bleeding problems. She denies any chest pain or worsening shortness of breath. As previously mentioned she does have a loud systolic murmur which is late peaking concerning for severe AS. We are planning on outpatient echocardiography for this.  PMHx:  Past Medical History  Diagnosis Date  . Arthritis   . Diabetes mellitus   . Hypertension   . Heart murmur   . Chicken pox   . Fractured pelvis ([place]) 05/14/2011  . Stroke [facility])     feb 2013  . Pituitary macroadenoma ([place])   . Insomnia   . Hyperlipemia   . Hemiparesis, right ([place])   . Dysphagia   . Bilateral venous insufficiency   . Aspiration pneumonia ([place])   . Aneurysm of thoracic aorta ([place])   . Gangrene of foot [facility])     Past Surgical History  Procedure Laterality Date  . Abdominal hysterectomy    . Above knee leg amputation      FAMHx:  Family History  Problem Relation Age of Onset  . Arthritis Mother   . Diabetes Sister   . Heart disease Brother   . Diabetes Brother     SOCHx:   reports that she has never smoked. She does not have any smokeless tobacco history on file. She reports that she does not drink alcohol or use illicit drugs.  ALLERGIES:  Allergies    Allergen Reactions  . Betadine [Povidone Iodine] Hives    Because of a history of documented adverse serious drug reaction;Medi Alert bracelet  is recommended  . Equalyte Hives    Medi Alert bracelet  is recommended if verified . Drug not found in reference texts (MPR or [place] [facility])  . Neosporin [Neomycin-Polymyxin-Gramicidin] Hives    Medi Alert bracelet  is recommended  . Codeine Other (See Comments)    Intolerance     ROS: Pertinent items noted in HPI and remainder of comprehensive ROS otherwise negative.  HOME MEDS: Current Outpatient Prescriptions  Medication Sig Dispense Refill  . apixaban (ELIQUIS) 5 MG TABS tablet Take 1 tablet (5 mg total) by mouth 2 (two) times daily. 60 tablet 0  . aspirin 81 MG chewable tablet Chew 1 tablet (81 mg total) by mouth daily. 30 tablet 1  . carvedilol (COREG) 12.5 MG tablet Take 1 tablet (12.5 mg total) by mouth 2 (two) times daily. 60 tablet 6  . cephALEXin (KEFLEX) 500 MG capsule Take 1 capsule (500 mg total) by mouth 4 (four) times daily. 10 capsule 0  . furosemide (LASIX) 20 MG tablet TAKE ONE TABLET BY MOUTH ONCE DAILY AS NEEDED FOR  SWELLING 30 tablet 3  . NONFORMULARY OR COMPOUNDED ITEM Pt is in need of a Right  AK Socket for her prosthetic. 1 each 0  . simvastatin (ZOCOR) 20 MG tablet TAKE ONE TABLET BY MOUTH ONCE DAILY AT 6 PM 30 tablet 6   No current facility-administered medications for this visit.    LABS/IMAGING: No results found for this or any previous visit (from the past 48 hour(s)). No results found.  WEIGHTS: Wt Readings from Last 3 Encounters:  03/22/15 160 lb (72.576 kg)  11/01/12 130 lb (58.968 kg)  04/25/11 136 lb 3.2 oz (61.78 kg)    VITALS: BP   Pulse 71  Ht 5\' 6"  (1.676 m)  Wt   EXAM: General appearance: alert and appears stated age Neck: no carotid bruit and no JVD Lungs: clear to auscultation bilaterally Heart: irregularly irregular rhythm Abdomen: soft, non-tender; bowel sounds normal;  no masses,  no organomegaly Extremities: Diffuse extremity ecchymosis Pulses: 2+ and symmetric Skin: Diffuse ecchymosis Neurologic: Mental status: Awake, answers questions Psych: Pleasant  EKG: A. fib with controlled ventricular response at 71, IVCD  ASSESSMENT: 1. New onset A. fib with controlled ventricular response-asymptomatic 2. CHADSVASC score of 7 - on Eliquis 3. Severe PAD-on aspirin 4. Dyslipidemia 5. Diastolic heart failure-on Lasix when necessary 6. CKD 3  PLAN: 1.   Mrs. Christina Pittman has significant comorbidities and is found to have aortic stenosis which is moderate to severe, but more likely severe by exam. We'll plan to get an echocardiogram for risk stratification. I do not feel that she'll be candidate for conventional aVR or TAVR. Rate controlling her A. fib is the strategy for now she is asymptomatic with it. We can prevent stroke with Eliquis. Given her significant comorbidities and age and did have a long discussion with the family about the possibility of palliative care and whether or not they had an advanced directive. They said that they have never had this discussion with her. She said that she is consistently said she would want everything possible done. I reiterated the fact that with her severe aortic valve disease, PAD in A. fib along with chronic kidney disease, she is at high risk of mortality over the next couple of years. Resuscitation is unlikely to be successful. The family said that they would discuss a power of attorney but are not willing to put any advanced directives into place at this time.  We will plan to check an echocardiogram and see her back in about one month. Blood pressure was low today and therefore will decrease her carvedilol to 12.5 mg twice a day.  Pixie Casino, MD, [facility] Attending Cardiologist [place] Rhian Funari 03/27/2015, 1:30 PM

## 2015-03-27 NOTE — Telephone Encounter (Signed)
Post ED Visit - Positive Culture Follow-up  Culture report reviewed by antimicrobial stewardship pharmacist:  []  Elenor Quinones, Pharm.D. []  Heide Guile, Pharm.D., BCPS []  Parks Neptune, Pharm.D. []  Alycia Rossetti, Pharm.D., BCPS []  [place], Pharm.D., BCPS, AAHIVP []  Legrand Como, Pharm.D., BCPS, AAHIVP []  Milus Glazier, Pharm.D. []  Stephens November, [place].D. Ulyess Mort Rumbarger, Pharm.D.  Positive urine culture, >/= 100,000 colonies -> E Coli Treated with Cephalexin, organism sensitive to the same and no further patient follow-up is required at this time.  Dortha Kern 03/27/2015, 5:45 AM

## 2015-03-27 NOTE — Telephone Encounter (Signed)
Ok for orders? 

## 2015-03-28 ENCOUNTER — Telehealth: Payer: Self-pay | Admitting: Behavioral Health

## 2015-03-28 NOTE — Telephone Encounter (Signed)
Transition Care Management Follow-up Telephone Call  PCP: Annye Asa, MD  Admit date: 03/23/2015 Discharge date: 03/25/2015   Recommendations for Outpatient Follow-up:  1. Follow up with Dr. Debara Pickett as scheduled  Discharge Diagnoses:  Principal Problem:  TIA (transient ischemic attack) Active Problems:  HTN (hypertension)  DM2 (diabetes mellitus, type 2) ([place])  HLD (hyperlipidemia)  Pituitary macroadenoma ([facility])  Impaired mobility  S/P AKA (above knee amputation) ([place])  New onset atrial fibrillation ([place])  Aortic stenosis  CKD (chronic kidney disease), stage III  Acute UTI  Discharge Condition: stable   How have you been since you were released from the hospital? Patient briefly spoke to writer, but she began to to tire easily and voiced that I speak to her granddaughter Kieth Brightly.   Per the granddaughter, "she's doing good at this time".    Do you understand why you were in the hospital? yes   Do you understand the discharge instructions? yes   Where were you discharged to? Home with granddaughter.   Items Reviewed:  Medications reviewed: yes  Allergies reviewed: yes  Dietary changes reviewed: yes, low-sodium diet  Referrals reviewed: None, patient was informed to follow-up with Dr. Debara Pickett, Cardiologist   Functional Questionnaire:   Activities of Daily Living (ADLs):   She states they are independent in the following: None States they require assistance with the following: ambulation, bathing and hygiene, feeding, continence, grooming, toileting and dressing   Any transportation issues/concerns?: no   Any patient concerns? no   Confirmed importance and date/time of follow-up visits scheduled yes, 04/03/15 at 11:00 AM.  Provider Appointment booked with Dr. Birdie Riddle.  Confirmed with patient if condition begins to worsen call PCP or go to the ER.  Patient was given the office number and encouraged to call back with question or concerns.  :  yes

## 2015-03-30 DIAGNOSIS — I739 Peripheral vascular disease, unspecified: Secondary | ICD-10-CM | POA: Diagnosis not present

## 2015-03-30 DIAGNOSIS — I129 Hypertensive chronic kidney disease with stage 1 through stage 4 chronic kidney disease, or unspecified chronic kidney disease: Secondary | ICD-10-CM | POA: Diagnosis not present

## 2015-03-30 DIAGNOSIS — N183 Chronic kidney disease, stage 3 (moderate): Secondary | ICD-10-CM | POA: Diagnosis not present

## 2015-03-30 DIAGNOSIS — N390 Urinary tract infection, site not specified: Secondary | ICD-10-CM | POA: Diagnosis not present

## 2015-03-30 DIAGNOSIS — Z8673 Personal history of transient ischemic attack (TIA), and cerebral infarction without residual deficits: Secondary | ICD-10-CM | POA: Diagnosis not present

## 2015-03-30 DIAGNOSIS — E1122 Type 2 diabetes mellitus with diabetic chronic kidney disease: Secondary | ICD-10-CM | POA: Diagnosis not present

## 2015-03-31 DIAGNOSIS — I129 Hypertensive chronic kidney disease with stage 1 through stage 4 chronic kidney disease, or unspecified chronic kidney disease: Secondary | ICD-10-CM | POA: Diagnosis not present

## 2015-03-31 DIAGNOSIS — N390 Urinary tract infection, site not specified: Secondary | ICD-10-CM | POA: Diagnosis not present

## 2015-03-31 DIAGNOSIS — E1122 Type 2 diabetes mellitus with diabetic chronic kidney disease: Secondary | ICD-10-CM | POA: Diagnosis not present

## 2015-03-31 DIAGNOSIS — Z8673 Personal history of transient ischemic attack (TIA), and cerebral infarction without residual deficits: Secondary | ICD-10-CM | POA: Diagnosis not present

## 2015-03-31 DIAGNOSIS — N183 Chronic kidney disease, stage 3 (moderate): Secondary | ICD-10-CM | POA: Diagnosis not present

## 2015-03-31 DIAGNOSIS — I739 Peripheral vascular disease, unspecified: Secondary | ICD-10-CM | POA: Diagnosis not present

## 2015-04-03 ENCOUNTER — Encounter: Payer: Self-pay | Admitting: Family Medicine

## 2015-04-03 ENCOUNTER — Ambulatory Visit (INDEPENDENT_AMBULATORY_CARE_PROVIDER_SITE_OTHER): Payer: Medicare Other | Admitting: Family Medicine

## 2015-04-03 VITALS — BP 142/82 | HR 86 | Temp 98.1°F | Resp 17

## 2015-04-03 DIAGNOSIS — I4891 Unspecified atrial fibrillation: Secondary | ICD-10-CM | POA: Diagnosis not present

## 2015-04-03 DIAGNOSIS — I350 Nonrheumatic aortic (valve) stenosis: Secondary | ICD-10-CM

## 2015-04-03 NOTE — Progress Notes (Signed)
Pre visit review using our clinic review tool, if applicable. No additional management support is needed unless otherwise documented below in the visit note. 

## 2015-04-03 NOTE — Progress Notes (Signed)
   Subjective:    Patient ID: Christina Pittman, female    DOB: 1919/02/28, 82 y.o.   MRN: [id number]  [facility] f/u- pt was admitted 2/23-25 w/ new onset Afib and suspected TIA.  Pt had MRI that was negative for stroke and neuro did not recommend any additional work up.  Her Plavix was switched to Eliquis and her ASA was stopped.  She had an outpt appt w/ Dr Debara Pickett on 2/27 during which there was concern for severe aortic stenosis (outpt ECHO pending).  Due to her age and medical comorbidities, she is not a candidate w/ valve replacement surgery.  Due to low BP at cards, her Coreg was decreased to 12.5mg  BID.  Cardiology attempted to discuss palliative care w/ pt given her multiple medical issues but they are adamant that they want everything done.     Review of Systems For ROS see HPI     Objective:   Physical Exam  Constitutional: She is oriented to person, place, and time. She appears well-developed. No distress.  Sitting in wheelchair  HENT:  Head: Normocephalic and atraumatic.  Neck: Normal range of motion. Neck supple. No thyromegaly present.  Cardiovascular: Normal rate.   Murmur (II-III/VI SEM) heard. Pulmonary/Chest: Effort normal and breath sounds normal. No respiratory distress. She has no wheezes. She has no rales.  Abdominal: Soft. Bowel sounds are normal. She exhibits no distension. There is no tenderness. There is no rebound.  Lymphadenopathy:    She has no cervical adenopathy.  Neurological: She is alert and oriented to person, place, and time.  Skin: Skin is warm and dry.  Psychiatric: She has a normal mood and affect. Her behavior is normal.  Limited insight into her condition  Vitals reviewed.         Assessment & Plan:

## 2015-04-03 NOTE — Patient Instructions (Signed)
Follow up in 3-4 months to recheck sugar and cholesterol- sooner if needed Continue the Coreg at 12.5mg  twice daily Continue the Eliquis daily ICE the bruise on your bottom- this will take quite a while to go away Call with any questions or concerns Hang in there!!!

## 2015-04-04 ENCOUNTER — Telehealth: Payer: Self-pay | Admitting: *Deleted

## 2015-04-04 NOTE — Telephone Encounter (Signed)
Received SLP Orders; forwarded to provider/SLS 03/07

## 2015-04-05 DIAGNOSIS — N183 Chronic kidney disease, stage 3 (moderate): Secondary | ICD-10-CM | POA: Diagnosis not present

## 2015-04-05 DIAGNOSIS — I129 Hypertensive chronic kidney disease with stage 1 through stage 4 chronic kidney disease, or unspecified chronic kidney disease: Secondary | ICD-10-CM | POA: Diagnosis not present

## 2015-04-05 DIAGNOSIS — I739 Peripheral vascular disease, unspecified: Secondary | ICD-10-CM | POA: Diagnosis not present

## 2015-04-05 DIAGNOSIS — E1122 Type 2 diabetes mellitus with diabetic chronic kidney disease: Secondary | ICD-10-CM | POA: Diagnosis not present

## 2015-04-05 DIAGNOSIS — N390 Urinary tract infection, site not specified: Secondary | ICD-10-CM | POA: Diagnosis not present

## 2015-04-05 DIAGNOSIS — Z8673 Personal history of transient ischemic attack (TIA), and cerebral infarction without residual deficits: Secondary | ICD-10-CM | POA: Diagnosis not present

## 2015-04-06 ENCOUNTER — Other Ambulatory Visit (HOSPITAL_COMMUNITY): Payer: Medicare Other

## 2015-04-07 DIAGNOSIS — Z8673 Personal history of transient ischemic attack (TIA), and cerebral infarction without residual deficits: Secondary | ICD-10-CM | POA: Diagnosis not present

## 2015-04-07 DIAGNOSIS — I739 Peripheral vascular disease, unspecified: Secondary | ICD-10-CM | POA: Diagnosis not present

## 2015-04-07 DIAGNOSIS — I129 Hypertensive chronic kidney disease with stage 1 through stage 4 chronic kidney disease, or unspecified chronic kidney disease: Secondary | ICD-10-CM | POA: Diagnosis not present

## 2015-04-07 DIAGNOSIS — N390 Urinary tract infection, site not specified: Secondary | ICD-10-CM | POA: Diagnosis not present

## 2015-04-07 DIAGNOSIS — N183 Chronic kidney disease, stage 3 (moderate): Secondary | ICD-10-CM | POA: Diagnosis not present

## 2015-04-07 DIAGNOSIS — E1122 Type 2 diabetes mellitus with diabetic chronic kidney disease: Secondary | ICD-10-CM | POA: Diagnosis not present

## 2015-04-10 ENCOUNTER — Telehealth: Payer: Self-pay | Admitting: *Deleted

## 2015-04-10 DIAGNOSIS — N183 Chronic kidney disease, stage 3 (moderate): Secondary | ICD-10-CM | POA: Diagnosis not present

## 2015-04-10 DIAGNOSIS — N390 Urinary tract infection, site not specified: Secondary | ICD-10-CM | POA: Diagnosis not present

## 2015-04-10 DIAGNOSIS — Z8673 Personal history of transient ischemic attack (TIA), and cerebral infarction without residual deficits: Secondary | ICD-10-CM | POA: Diagnosis not present

## 2015-04-10 DIAGNOSIS — I739 Peripheral vascular disease, unspecified: Secondary | ICD-10-CM | POA: Diagnosis not present

## 2015-04-10 DIAGNOSIS — I129 Hypertensive chronic kidney disease with stage 1 through stage 4 chronic kidney disease, or unspecified chronic kidney disease: Secondary | ICD-10-CM | POA: Diagnosis not present

## 2015-04-10 DIAGNOSIS — E1122 Type 2 diabetes mellitus with diabetic chronic kidney disease: Secondary | ICD-10-CM | POA: Diagnosis not present

## 2015-04-10 NOTE — Telephone Encounter (Signed)
Received Home Health Certification and Plan of Care; forwarded to provider/SLS 03/13 

## 2015-04-11 DIAGNOSIS — I739 Peripheral vascular disease, unspecified: Secondary | ICD-10-CM | POA: Diagnosis not present

## 2015-04-11 DIAGNOSIS — Z8673 Personal history of transient ischemic attack (TIA), and cerebral infarction without residual deficits: Secondary | ICD-10-CM | POA: Diagnosis not present

## 2015-04-11 DIAGNOSIS — E1122 Type 2 diabetes mellitus with diabetic chronic kidney disease: Secondary | ICD-10-CM | POA: Diagnosis not present

## 2015-04-11 DIAGNOSIS — N390 Urinary tract infection, site not specified: Secondary | ICD-10-CM | POA: Diagnosis not present

## 2015-04-11 DIAGNOSIS — N183 Chronic kidney disease, stage 3 (moderate): Secondary | ICD-10-CM | POA: Diagnosis not present

## 2015-04-11 DIAGNOSIS — I129 Hypertensive chronic kidney disease with stage 1 through stage 4 chronic kidney disease, or unspecified chronic kidney disease: Secondary | ICD-10-CM | POA: Diagnosis not present

## 2015-04-12 DIAGNOSIS — E1122 Type 2 diabetes mellitus with diabetic chronic kidney disease: Secondary | ICD-10-CM | POA: Diagnosis not present

## 2015-04-12 DIAGNOSIS — N183 Chronic kidney disease, stage 3 (moderate): Secondary | ICD-10-CM | POA: Diagnosis not present

## 2015-04-12 DIAGNOSIS — N390 Urinary tract infection, site not specified: Secondary | ICD-10-CM | POA: Diagnosis not present

## 2015-04-12 DIAGNOSIS — Z8673 Personal history of transient ischemic attack (TIA), and cerebral infarction without residual deficits: Secondary | ICD-10-CM | POA: Diagnosis not present

## 2015-04-12 DIAGNOSIS — I739 Peripheral vascular disease, unspecified: Secondary | ICD-10-CM | POA: Diagnosis not present

## 2015-04-12 DIAGNOSIS — I129 Hypertensive chronic kidney disease with stage 1 through stage 4 chronic kidney disease, or unspecified chronic kidney disease: Secondary | ICD-10-CM | POA: Diagnosis not present

## 2015-04-13 DIAGNOSIS — I739 Peripheral vascular disease, unspecified: Secondary | ICD-10-CM | POA: Diagnosis not present

## 2015-04-13 DIAGNOSIS — N390 Urinary tract infection, site not specified: Secondary | ICD-10-CM | POA: Diagnosis not present

## 2015-04-13 DIAGNOSIS — I129 Hypertensive chronic kidney disease with stage 1 through stage 4 chronic kidney disease, or unspecified chronic kidney disease: Secondary | ICD-10-CM | POA: Diagnosis not present

## 2015-04-13 DIAGNOSIS — N183 Chronic kidney disease, stage 3 (moderate): Secondary | ICD-10-CM | POA: Diagnosis not present

## 2015-04-13 DIAGNOSIS — E1122 Type 2 diabetes mellitus with diabetic chronic kidney disease: Secondary | ICD-10-CM | POA: Diagnosis not present

## 2015-04-13 DIAGNOSIS — Z8673 Personal history of transient ischemic attack (TIA), and cerebral infarction without residual deficits: Secondary | ICD-10-CM | POA: Diagnosis not present

## 2015-04-17 ENCOUNTER — Telehealth: Payer: Self-pay

## 2015-04-17 DIAGNOSIS — E1122 Type 2 diabetes mellitus with diabetic chronic kidney disease: Secondary | ICD-10-CM | POA: Diagnosis not present

## 2015-04-17 DIAGNOSIS — I129 Hypertensive chronic kidney disease with stage 1 through stage 4 chronic kidney disease, or unspecified chronic kidney disease: Secondary | ICD-10-CM | POA: Diagnosis not present

## 2015-04-17 DIAGNOSIS — I739 Peripheral vascular disease, unspecified: Secondary | ICD-10-CM | POA: Diagnosis not present

## 2015-04-17 DIAGNOSIS — N183 Chronic kidney disease, stage 3 (moderate): Secondary | ICD-10-CM | POA: Diagnosis not present

## 2015-04-17 DIAGNOSIS — Z8673 Personal history of transient ischemic attack (TIA), and cerebral infarction without residual deficits: Secondary | ICD-10-CM | POA: Diagnosis not present

## 2015-04-17 DIAGNOSIS — N390 Urinary tract infection, site not specified: Secondary | ICD-10-CM | POA: Diagnosis not present

## 2015-04-17 NOTE — Telephone Encounter (Signed)
[employer] note received via fax  Call  Date: 04/15/15 Time: 3:16:33pm   Caller:  Patty (Grandchild) Return number:  [phone]  Nurse: Marc Morgans, RN  Chief Complaint:  Nosebleed   Reason for call:  Caller states she started a new blood thinner on 03/23/25, it's called Eliquis.  She is having nose bleeds per granddaughter.    Related visit to physician within the last 2 weeks:  No  Guideline:  Nosebleed  Disposition:  See Physician within 24 hours.    Called to follow up.  Unable to reach her.  Caller daughter's number and she says that nosebleeds have stopped, but she will call to see if any further actions are needed.

## 2015-04-18 DIAGNOSIS — Z8673 Personal history of transient ischemic attack (TIA), and cerebral infarction without residual deficits: Secondary | ICD-10-CM | POA: Diagnosis not present

## 2015-04-18 DIAGNOSIS — I129 Hypertensive chronic kidney disease with stage 1 through stage 4 chronic kidney disease, or unspecified chronic kidney disease: Secondary | ICD-10-CM | POA: Diagnosis not present

## 2015-04-18 DIAGNOSIS — N183 Chronic kidney disease, stage 3 (moderate): Secondary | ICD-10-CM | POA: Diagnosis not present

## 2015-04-18 DIAGNOSIS — E1122 Type 2 diabetes mellitus with diabetic chronic kidney disease: Secondary | ICD-10-CM | POA: Diagnosis not present

## 2015-04-18 DIAGNOSIS — N390 Urinary tract infection, site not specified: Secondary | ICD-10-CM | POA: Diagnosis not present

## 2015-04-18 DIAGNOSIS — I739 Peripheral vascular disease, unspecified: Secondary | ICD-10-CM | POA: Diagnosis not present

## 2015-04-18 NOTE — Telephone Encounter (Signed)
Pt would like to transition to Elyn Aquas considering her current PCP move to a new location. Pt would like to know if it is okay to wait until Monday to come in for FU?    Please advise.   CB: [phone]

## 2015-04-18 NOTE — Telephone Encounter (Signed)
Reviewed in Dr. Virgil Benedict absence. Will leave for her to review as well. Would recommend we follow-up once more to make sure there has been no recurrence of bleeds. If so, recommend patient is seen in office.

## 2015-04-18 NOTE — Telephone Encounter (Signed)
I am ok with her transferring. If there are no recurrent nose bleeds, they can come in Monday. If there is any continued bleeding, they need to be seen this week

## 2015-04-19 ENCOUNTER — Telehealth: Payer: Self-pay | Admitting: Internal Medicine

## 2015-04-19 ENCOUNTER — Other Ambulatory Visit: Payer: Self-pay | Admitting: Cardiovascular Disease

## 2015-04-19 ENCOUNTER — Telehealth: Payer: Self-pay | Admitting: Family Medicine

## 2015-04-19 DIAGNOSIS — E1122 Type 2 diabetes mellitus with diabetic chronic kidney disease: Secondary | ICD-10-CM | POA: Diagnosis not present

## 2015-04-19 DIAGNOSIS — I129 Hypertensive chronic kidney disease with stage 1 through stage 4 chronic kidney disease, or unspecified chronic kidney disease: Secondary | ICD-10-CM | POA: Diagnosis not present

## 2015-04-19 DIAGNOSIS — I739 Peripheral vascular disease, unspecified: Secondary | ICD-10-CM | POA: Diagnosis not present

## 2015-04-19 DIAGNOSIS — N183 Chronic kidney disease, stage 3 (moderate): Secondary | ICD-10-CM | POA: Diagnosis not present

## 2015-04-19 DIAGNOSIS — Z8673 Personal history of transient ischemic attack (TIA), and cerebral infarction without residual deficits: Secondary | ICD-10-CM | POA: Diagnosis not present

## 2015-04-19 DIAGNOSIS — N390 Urinary tract infection, site not specified: Secondary | ICD-10-CM | POA: Diagnosis not present

## 2015-04-19 MED ORDER — APIXABAN 5 MG PO TABS: 5.0000 mg | ORAL_TABLET | Freq: Two times a day (BID) | ORAL | Status: DC

## 2015-04-19 NOTE — Telephone Encounter (Signed)
Spoke with pt pharmacy, they said the pt told them she had medicaid but they are unable to confirm that. She has a discount card for prescriptions, goodrx? Unable to reach pt or leave a message

## 2015-04-19 NOTE — Telephone Encounter (Signed)
°*  STAT* If patient is at the pharmacy, call can be transferred to refill team.   1. Which medications need to be refilled? (please list name of each medication and dose if known)Eliquis   2. Which pharmacy/location (including street and city if local pharmacy) is medication to be sent to?[facility] [address] in [place]   3. Do they need a 30 day or 90 day supply?[place]

## 2015-04-19 NOTE — Telephone Encounter (Signed)
Called pt back to establish with Elyn Aquas. Pt's [place] daughter says that she was with pt when nose bleed occurred. She says that pt was digging in her nose and scratched the inside which caused a nose bleed. [place] daughter says that pt doesn't need to come in to be seen in her opinion. Pt had an FU appt scheduled with [facility] in June. Scheduled pt with Hassell Done instead.

## 2015-04-19 NOTE — Telephone Encounter (Signed)
[place] called in stating that the pt takes Eliquis and the copay for this is $415 per month. She has used up her  manufactured coupons. Is there another medication she can take, she is down to one pill. Please f/u with the pharmacy and pt  Thanks

## 2015-04-19 NOTE — Telephone Encounter (Signed)
Patient is not transferring to me until June so I will not take over medication management until that time. Will defer to current PCP until I have seen patient.

## 2015-04-19 NOTE — Telephone Encounter (Signed)
Rx(s) sent to pharmacy electronically.  

## 2015-04-19 NOTE — Telephone Encounter (Signed)
Pt's grand daughter Christina Pittman called in because she says that her grandmother was prescribed Apixaban. Pt says that this medication is very expensive and would like to know if something compible could be prescribed that's less expensive.   Please advise further.    CB: [phone] --- [name] daughter)  - cell [phone]    Pharmacy : [facility], [place] - [address]

## 2015-04-19 NOTE — Telephone Encounter (Signed)
Also, made family aware that Rx wasn't prescribed by PCP, they said that they are having a time getting back in touch with prescribing provider considering that it was prescribed while pt was in the hospital.

## 2015-04-20 ENCOUNTER — Other Ambulatory Visit (HOSPITAL_COMMUNITY): Payer: Medicare Other

## 2015-04-20 NOTE — Telephone Encounter (Signed)
Called and left a detailed message for pt in regards to contacting cardiology.

## 2015-04-20 NOTE — Telephone Encounter (Signed)
Will not be able to change Eliquis for pt- she should contact cardiology (I see a phone note in the system)

## 2015-04-21 DIAGNOSIS — N390 Urinary tract infection, site not specified: Secondary | ICD-10-CM | POA: Diagnosis not present

## 2015-04-21 DIAGNOSIS — I129 Hypertensive chronic kidney disease with stage 1 through stage 4 chronic kidney disease, or unspecified chronic kidney disease: Secondary | ICD-10-CM | POA: Diagnosis not present

## 2015-04-21 DIAGNOSIS — E1122 Type 2 diabetes mellitus with diabetic chronic kidney disease: Secondary | ICD-10-CM | POA: Diagnosis not present

## 2015-04-21 DIAGNOSIS — I739 Peripheral vascular disease, unspecified: Secondary | ICD-10-CM | POA: Diagnosis not present

## 2015-04-21 DIAGNOSIS — N183 Chronic kidney disease, stage 3 (moderate): Secondary | ICD-10-CM | POA: Diagnosis not present

## 2015-04-21 DIAGNOSIS — Z8673 Personal history of transient ischemic attack (TIA), and cerebral infarction without residual deficits: Secondary | ICD-10-CM | POA: Diagnosis not present

## 2015-04-25 ENCOUNTER — Encounter: Payer: Medicare Other | Admitting: Cardiology

## 2015-04-25 ENCOUNTER — Ambulatory Visit: Payer: Medicare Other | Admitting: Internal Medicine

## 2015-04-25 ENCOUNTER — Encounter: Payer: Self-pay | Admitting: Cardiology

## 2015-04-25 DIAGNOSIS — N390 Urinary tract infection, site not specified: Secondary | ICD-10-CM | POA: Diagnosis not present

## 2015-04-25 DIAGNOSIS — I129 Hypertensive chronic kidney disease with stage 1 through stage 4 chronic kidney disease, or unspecified chronic kidney disease: Secondary | ICD-10-CM | POA: Diagnosis not present

## 2015-04-25 DIAGNOSIS — Z8673 Personal history of transient ischemic attack (TIA), and cerebral infarction without residual deficits: Secondary | ICD-10-CM | POA: Diagnosis not present

## 2015-04-25 DIAGNOSIS — I739 Peripheral vascular disease, unspecified: Secondary | ICD-10-CM | POA: Diagnosis not present

## 2015-04-25 DIAGNOSIS — E1122 Type 2 diabetes mellitus with diabetic chronic kidney disease: Secondary | ICD-10-CM | POA: Diagnosis not present

## 2015-04-25 DIAGNOSIS — N183 Chronic kidney disease, stage 3 (moderate): Secondary | ICD-10-CM | POA: Diagnosis not present

## 2015-04-25 NOTE — Progress Notes (Signed)
Patient ID: Kallen Basic, female   DOB: 1919-11-30, 82 y.o.   MRN: [id number]     Clinical Summary Ms. Rummel is a 82 y.o.female last seen by Dr Debara Pickett, they are establishing in our [place] office to be close to home.  1. Aortic stenosis 03/2011 echo LVEF 65-70%, AVA VTI 0.85 mean grad 32   2. PAD - history of right AKA amputatoin   3. Afib - on eliquis   4. Hyperlipidemia   Past Medical History  Diagnosis Date  . Arthritis   . Diabetes mellitus   . Hypertension   . Heart murmur   . Chicken pox   . Fractured pelvis ([place]) 05/14/2011  . Stroke [facility])     feb 2013  . Pituitary macroadenoma ([place])   . Insomnia   . Hyperlipemia   . Hemiparesis, right ([place])   . Dysphagia   . Bilateral venous insufficiency   . Aspiration pneumonia ([place])   . Aneurysm of thoracic aorta ([place])   . Gangrene of foot ([facility])      Allergies  Allergen Reactions  . Betadine [Povidone Iodine] Hives    Because of a history of documented adverse serious drug reaction;Medi Alert bracelet  is recommended  . Equalyte Hives    Medi Alert bracelet  is recommended if verified . Drug not found in reference texts (MPR or [place] [facility])  . Neosporin [Neomycin-Polymyxin-Gramicidin] Hives    Medi Alert bracelet  is recommended  . Codeine Other (See Comments)    Intolerance      Current Outpatient Prescriptions  Medication Sig Dispense Refill  . apixaban (ELIQUIS) 5 MG TABS tablet Take 1 tablet (5 mg total) by mouth 2 (two) times daily. 60 tablet 3  . aspirin 81 MG chewable tablet Chew 1 tablet (81 mg total) by mouth daily. 30 tablet 1  . carvedilol (COREG) 12.5 MG tablet Take 1 tablet (12.5 mg total) by mouth 2 (two) times daily. 60 tablet 6  . cephALEXin (KEFLEX) 500 MG capsule Take 1 capsule (500 mg total) by mouth 4 (four) times daily. 10 capsule 0  . furosemide (LASIX) 20 MG tablet TAKE ONE TABLET BY MOUTH ONCE DAILY AS NEEDED FOR  SWELLING 30 tablet 3  . NONFORMULARY OR COMPOUNDED ITEM Pt is  in need of a Right AK Socket for her prosthetic. 1 each 0  . simvastatin (ZOCOR) 20 MG tablet TAKE ONE TABLET BY MOUTH ONCE DAILY AT 6 PM 30 tablet 6   No current facility-administered medications for this visit.     Past Surgical History  Procedure Laterality Date  . Abdominal hysterectomy    . Above knee leg amputation       Allergies  Allergen Reactions  . Betadine [Povidone Iodine] Hives    Because of a history of documented adverse serious drug reaction;Medi Alert bracelet  is recommended  . Equalyte Hives    Medi Alert bracelet  is recommended if verified . Drug not found in reference texts (MPR or [place] [facility])  . Neosporin [Neomycin-Polymyxin-Gramicidin] Hives    Medi Alert bracelet  is recommended  . Codeine Other (See Comments)    Intolerance       Family History  Problem Relation Age of Onset  . Arthritis Mother   . Diabetes Sister   . Heart disease Brother     MI  . Diabetes Brother   . Cancer Father   . Liver disease Brother   . Heart attack Brother   . Clotting disorder Sister  blood clot     Social History Ms. Lupo reports that she has never smoked. She does not have any smokeless tobacco history on file. Ms. Sklar reports that she does not drink alcohol.   Review of Systems CONSTITUTIONAL: No weight loss, fever, chills, weakness or fatigue.  HEENT: Eyes: No visual loss, blurred vision, double vision or yellow sclerae.No hearing loss, sneezing, congestion, runny nose or sore throat.  SKIN: No rash or itching.  CARDIOVASCULAR:  RESPIRATORY: No shortness of breath, cough or sputum.  GASTROINTESTINAL: No anorexia, nausea, vomiting or diarrhea. No abdominal pain or blood.  GENITOURINARY: No burning on urination, no polyuria NEUROLOGICAL: No headache, dizziness, syncope, paralysis, ataxia, numbness or tingling in the extremities. No change in bowel or bladder control.  MUSCULOSKELETAL: No muscle, back pain, joint pain or stiffness.    LYMPHATICS: No enlarged nodes. No history of splenectomy.  PSYCHIATRIC: No history of depression or anxiety.  ENDOCRINOLOGIC: No reports of sweating, cold or heat intolerance. No polyuria or polydipsia.  Marland Kitchen   Physical Examination There were no vitals filed for this visit. There were no vitals filed for this visit.  Gen: resting comfortably, no acute distress HEENT: no scleral icterus, pupils equal round and reactive, no palptable cervical adenopathy,  CV Resp: Clear to auscultation bilaterally GI: abdomen is soft, non-tender, non-distended, normal bowel sounds, no hepatosplenomegaly MSK: extremities are warm, no edema.  Skin: warm, no rash Neuro:  no focal deficits Psych: appropriate affect   Diagnostic Studies     Assessment and Plan        Arnoldo Lenis, M.D., F.A.C.C.

## 2015-04-28 DIAGNOSIS — N183 Chronic kidney disease, stage 3 (moderate): Secondary | ICD-10-CM | POA: Diagnosis not present

## 2015-04-28 DIAGNOSIS — I739 Peripheral vascular disease, unspecified: Secondary | ICD-10-CM | POA: Diagnosis not present

## 2015-04-28 DIAGNOSIS — Z8673 Personal history of transient ischemic attack (TIA), and cerebral infarction without residual deficits: Secondary | ICD-10-CM | POA: Diagnosis not present

## 2015-04-28 DIAGNOSIS — E1122 Type 2 diabetes mellitus with diabetic chronic kidney disease: Secondary | ICD-10-CM | POA: Diagnosis not present

## 2015-04-28 DIAGNOSIS — N390 Urinary tract infection, site not specified: Secondary | ICD-10-CM | POA: Diagnosis not present

## 2015-04-28 DIAGNOSIS — I129 Hypertensive chronic kidney disease with stage 1 through stage 4 chronic kidney disease, or unspecified chronic kidney disease: Secondary | ICD-10-CM | POA: Diagnosis not present

## 2015-05-02 DIAGNOSIS — N183 Chronic kidney disease, stage 3 (moderate): Secondary | ICD-10-CM | POA: Diagnosis not present

## 2015-05-02 DIAGNOSIS — I739 Peripheral vascular disease, unspecified: Secondary | ICD-10-CM | POA: Diagnosis not present

## 2015-05-02 DIAGNOSIS — Z8673 Personal history of transient ischemic attack (TIA), and cerebral infarction without residual deficits: Secondary | ICD-10-CM | POA: Diagnosis not present

## 2015-05-02 DIAGNOSIS — N390 Urinary tract infection, site not specified: Secondary | ICD-10-CM | POA: Diagnosis not present

## 2015-05-02 DIAGNOSIS — I129 Hypertensive chronic kidney disease with stage 1 through stage 4 chronic kidney disease, or unspecified chronic kidney disease: Secondary | ICD-10-CM | POA: Diagnosis not present

## 2015-05-02 DIAGNOSIS — E1122 Type 2 diabetes mellitus with diabetic chronic kidney disease: Secondary | ICD-10-CM | POA: Diagnosis not present

## 2015-05-03 DIAGNOSIS — I129 Hypertensive chronic kidney disease with stage 1 through stage 4 chronic kidney disease, or unspecified chronic kidney disease: Secondary | ICD-10-CM | POA: Diagnosis not present

## 2015-05-03 DIAGNOSIS — Z8673 Personal history of transient ischemic attack (TIA), and cerebral infarction without residual deficits: Secondary | ICD-10-CM | POA: Diagnosis not present

## 2015-05-03 DIAGNOSIS — E1122 Type 2 diabetes mellitus with diabetic chronic kidney disease: Secondary | ICD-10-CM | POA: Diagnosis not present

## 2015-05-03 DIAGNOSIS — N183 Chronic kidney disease, stage 3 (moderate): Secondary | ICD-10-CM | POA: Diagnosis not present

## 2015-05-03 DIAGNOSIS — I739 Peripheral vascular disease, unspecified: Secondary | ICD-10-CM | POA: Diagnosis not present

## 2015-05-03 DIAGNOSIS — N390 Urinary tract infection, site not specified: Secondary | ICD-10-CM | POA: Diagnosis not present

## 2015-05-05 ENCOUNTER — Telehealth: Payer: Self-pay | Admitting: Family Medicine

## 2015-05-05 NOTE — Telephone Encounter (Signed)
Call[facility]/ [facility] asking for a verbal to make up an occupational therapy visit that was missed for one visit/ for one week.

## 2015-05-05 NOTE — Telephone Encounter (Signed)
Verbal ok given.

## 2015-05-12 DIAGNOSIS — Z8673 Personal history of transient ischemic attack (TIA), and cerebral infarction without residual deficits: Secondary | ICD-10-CM | POA: Diagnosis not present

## 2015-05-12 DIAGNOSIS — N390 Urinary tract infection, site not specified: Secondary | ICD-10-CM | POA: Diagnosis not present

## 2015-05-12 DIAGNOSIS — I129 Hypertensive chronic kidney disease with stage 1 through stage 4 chronic kidney disease, or unspecified chronic kidney disease: Secondary | ICD-10-CM | POA: Diagnosis not present

## 2015-05-12 DIAGNOSIS — E1122 Type 2 diabetes mellitus with diabetic chronic kidney disease: Secondary | ICD-10-CM | POA: Diagnosis not present

## 2015-05-12 DIAGNOSIS — I739 Peripheral vascular disease, unspecified: Secondary | ICD-10-CM | POA: Diagnosis not present

## 2015-05-12 DIAGNOSIS — N183 Chronic kidney disease, stage 3 (moderate): Secondary | ICD-10-CM | POA: Diagnosis not present

## 2015-05-17 ENCOUNTER — Telehealth: Payer: Self-pay | Admitting: General Practice

## 2015-05-17 DIAGNOSIS — Z8673 Personal history of transient ischemic attack (TIA), and cerebral infarction without residual deficits: Secondary | ICD-10-CM | POA: Diagnosis not present

## 2015-05-17 DIAGNOSIS — I129 Hypertensive chronic kidney disease with stage 1 through stage 4 chronic kidney disease, or unspecified chronic kidney disease: Secondary | ICD-10-CM | POA: Diagnosis not present

## 2015-05-17 DIAGNOSIS — N390 Urinary tract infection, site not specified: Secondary | ICD-10-CM | POA: Diagnosis not present

## 2015-05-17 DIAGNOSIS — N183 Chronic kidney disease, stage 3 (moderate): Secondary | ICD-10-CM | POA: Diagnosis not present

## 2015-05-17 DIAGNOSIS — E1122 Type 2 diabetes mellitus with diabetic chronic kidney disease: Secondary | ICD-10-CM | POA: Diagnosis not present

## 2015-05-17 DIAGNOSIS — I739 Peripheral vascular disease, unspecified: Secondary | ICD-10-CM | POA: Diagnosis not present

## 2015-05-17 NOTE — Telephone Encounter (Signed)
OT orders received and faxed back to [facility] today

## 2015-06-08 ENCOUNTER — Other Ambulatory Visit: Payer: Self-pay | Admitting: Family Medicine

## 2015-06-09 NOTE — Telephone Encounter (Signed)
FYI, Last OV 04/03/15 (No documentation/HPI for this visit)   Medication filled to pharmacy as requested.

## 2015-06-15 NOTE — Assessment & Plan Note (Signed)
New.  Pt is following w/ Cards and now on Eliquis and Coreg.  I understand the need for anticoagulation but I have concerns given her high risk of falls- R AKA, R hemiparesis.  Discussed the need for extreme caution with family and that she is not to attempt to ambulate w/o assistance.  Will follow and assist as able.

## 2015-06-15 NOTE — Assessment & Plan Note (Signed)
Chronic problem.  Pt's murmur is loud and cards is concerned for severe aortic stenosis.  She is not a valve replacement candidate given her age and medical comorbidities but pt and family remain adamant that they want every thing done and she is to remain a full code.  She has outpt ECHO pending.  Will follow along and assist as able.

## 2015-07-11 DIAGNOSIS — N390 Urinary tract infection, site not specified: Secondary | ICD-10-CM | POA: Diagnosis not present

## 2015-07-11 DIAGNOSIS — R350 Frequency of micturition: Secondary | ICD-10-CM | POA: Diagnosis not present

## 2015-07-12 ENCOUNTER — Ambulatory Visit (INDEPENDENT_AMBULATORY_CARE_PROVIDER_SITE_OTHER): Payer: Medicare Other | Admitting: Family Medicine

## 2015-07-12 ENCOUNTER — Encounter: Payer: Self-pay | Admitting: Family Medicine

## 2015-07-12 ENCOUNTER — Ambulatory Visit: Payer: Medicare Other | Admitting: Family Medicine

## 2015-07-12 ENCOUNTER — Ambulatory Visit: Payer: Medicare Other | Admitting: Physician Assistant

## 2015-07-12 VITALS — BP 136/81 | HR 72 | Temp 98.0°F | Resp 18 | Ht 66.0 in

## 2015-07-12 DIAGNOSIS — E785 Hyperlipidemia, unspecified: Secondary | ICD-10-CM | POA: Diagnosis not present

## 2015-07-12 DIAGNOSIS — E1159 Type 2 diabetes mellitus with other circulatory complications: Secondary | ICD-10-CM

## 2015-07-12 DIAGNOSIS — I10 Essential (primary) hypertension: Secondary | ICD-10-CM

## 2015-07-12 NOTE — Patient Instructions (Signed)
Schedule your complete physical for December We'll notify you of your lab results and make any changes if needed Continue to eat regularly Take your medications as prescribed Call with any questions or concerns Hang in there!!!

## 2015-07-12 NOTE — Assessment & Plan Note (Signed)
Chronic problem.  Adequate control.  Asymptomatic.  Check labs.  No anticipated med changes.  Again reviewed w/ family that her aortic stenosis is severe and that they need to be prepared for when she becomes more symptomatic b/c she is not a candidate for surgical intervention.  Family seems to be aware of this.  Will follow.

## 2015-07-12 NOTE — Assessment & Plan Note (Signed)
Chronic problem, tolerating simvastatin w/o difficulty.  Check labs.  Adjust meds prn  

## 2015-07-12 NOTE — Assessment & Plan Note (Signed)
Chronic problem.  Currently diet controlled.  Refusing eye exam.  Foot exam done today.  Will get Microalbumin.  Check labs.  Will continue to follow.

## 2015-07-12 NOTE — Progress Notes (Signed)
   Subjective:    Patient ID: Christina Pittman, female    DOB: March 05, 1919, 82 y.o.   MRN: [id number]  HPI HTN- chronic problem, on Coreg and Lasix daily w/ adequate control.  No CP, SOB, HAs.  Hyperlipidemia- chronic problem, on Simvastatin.  No abd pain, N/V.  DM- chronic problem, diet controlled.  Pt declines eye exam.  Due for microalbumin.  Eating regularly.  Not skipping meals.    Review of Systems For ROS see HPI     Objective:   Physical Exam  Constitutional: She appears well-developed and well-nourished.  Sitting in wheel chair w/ her head in her hands  HENT:  Head: Normocephalic and atraumatic.  Neck: No thyromegaly present.  Cardiovascular: Intact distal pulses.   Murmur heard. Irregularly irregular [id number] Holosystolic aortic murmur  Pulmonary/Chest: Effort normal and breath sounds normal. No respiratory distress. She has no wheezes. She has no rales.  Abdominal: Soft. She exhibits no distension. There is no tenderness. There is no rebound and no guarding.  Lymphadenopathy:    She has no cervical adenopathy.  Neurological: She is alert.  Oriented to person and place  Skin: Skin is warm and dry. No rash noted. No erythema.  Psychiatric:  Somewhat combative today Less interactive than usual  Vitals reviewed.         Assessment & Plan:

## 2015-07-12 NOTE — Progress Notes (Signed)
Pre visit review using our clinic review tool, if applicable. No additional management support is needed unless otherwise documented below in the visit note. 

## 2015-07-13 ENCOUNTER — Ambulatory Visit: Payer: Medicare Other | Admitting: Family Medicine

## 2015-07-14 ENCOUNTER — Encounter: Payer: Self-pay | Admitting: General Practice

## 2015-07-14 LAB — HEPATIC FUNCTION PANEL
ALBUMIN: 3.4 g/dL — AB (ref 3.5–5.2)
ALT: 9 U/L (ref 0–35)
AST: 18 U/L (ref 0–37)
Alkaline Phosphatase: 57 U/L (ref 39–117)
BILIRUBIN TOTAL: 0.5 mg/dL (ref 0.2–1.2)
Bilirubin, Direct: 0.1 mg/dL (ref 0.0–0.3)
Total Protein: 6.5 g/dL (ref 6.0–8.3)

## 2015-07-14 LAB — BASIC METABOLIC PANEL
BUN: 18 mg/dL (ref 6–23)
CHLORIDE: 107 meq/L (ref 96–112)
CO2: 22 mEq/L (ref 19–32)
CREATININE: 1.39 mg/dL — AB (ref 0.40–1.20)
Calcium: 8.6 mg/dL (ref 8.4–10.5)
GFR: 37.52 mL/min — AB (ref >60.00)
GLUCOSE: 154 mg/dL — AB (ref 70–99)
Potassium: 4 mEq/L (ref 3.5–5.1)
Sodium: 138 mEq/L (ref 135–145)

## 2015-07-14 LAB — CBC WITH DIFFERENTIAL/PLATELET
BASOS ABS: 0.1 10*3/uL (ref 0.0–0.1)
Basophils Relative: 1.2 % (ref 0.0–3.0)
EOS PCT: 2.4 % (ref 0.0–5.0)
Eosinophils Absolute: 0.2 10*3/uL (ref 0.0–0.7)
HCT: 39.4 % (ref 36.0–46.0)
HEMOGLOBIN: 13 g/dL (ref 12.0–15.0)
LYMPHS ABS: 1.4 10*3/uL (ref 0.7–4.0)
Lymphocytes Relative: 16.4 % (ref 12.0–46.0)
MCHC: 32.8 g/dL (ref 30.0–36.0)
MCV: 88.2 fl (ref 78.0–100.0)
MONO ABS: 1.1 10*3/uL — AB (ref 0.1–1.0)
Monocytes Relative: 12 % (ref 3.0–12.0)
NEUTROS PCT: 68 % (ref 43.0–77.0)
Neutro Abs: 6 10*3/uL (ref 1.4–7.7)
Platelets: 191 10*3/uL (ref 150.0–400.0)
RBC: 4.47 Mil/uL (ref 3.87–5.11)
RDW: 15.7 % — ABNORMAL HIGH (ref 11.5–15.5)
WBC: 8.8 10*3/uL (ref 4.0–10.5)

## 2015-07-14 LAB — LIPID PANEL
CHOL/HDL RATIO: 3
Cholesterol: 94 mg/dL (ref 0–200)
HDL: 30.9 mg/dL — ABNORMAL LOW (ref >39.00)
LDL CALC: 49 mg/dL (ref 0–99)
NonHDL: 62.78
Triglycerides: 71 mg/dL (ref 0.0–149.0)
VLDL: 14.2 mg/dL (ref 0.0–40.0)

## 2015-07-14 LAB — TSH: TSH: 1.14 u[IU]/mL (ref 0.35–4.50)

## 2015-07-14 LAB — MICROALBUMIN / CREATININE URINE RATIO
CREATININE, U: 148.7 mg/dL
MICROALB UR: 14.4 mg/dL — AB (ref 0.0–1.9)
MICROALB/CREAT RATIO: 9.7 mg/g (ref 0.0–30.0)

## 2015-07-14 LAB — HEMOGLOBIN A1C: Hgb A1c MFr Bld: 6.3 % (ref 4.6–6.5)

## 2015-08-18 ENCOUNTER — Telehealth: Payer: Self-pay | Admitting: Family Medicine

## 2015-08-18 NOTE — Telephone Encounter (Signed)
Please advise this was last filled by Dr. Debara Pickett office on 04/19/15 #60 with 3.

## 2015-08-18 NOTE — Telephone Encounter (Signed)
Patient's son [name] walked in to request refill of apixaban (ELIQUIS) 5 MG TABS tablet on behalf of patient.  Please send to [place], [place].

## 2015-08-18 NOTE — Telephone Encounter (Signed)
Please forward to Dr Debara Pickett for refill

## 2015-08-19 NOTE — Telephone Encounter (Signed)
Ok to provide 30 day supply of Eliquis. Was scheduled to see Dr. Harl Bowie in [place], but did not show for that appointment. I will make him aware of this.  Dr. Lemmie Evens

## 2015-08-21 MED ORDER — APIXABAN 5 MG PO TABS: 5.0000 mg | ORAL_TABLET | Freq: Two times a day (BID) | ORAL | 0 refills | Status: DC

## 2015-08-21 NOTE — Telephone Encounter (Signed)
This patient needs to reschedule with me  Zandra Abts MD

## 2015-08-21 NOTE — Telephone Encounter (Signed)
Rx(s) sent to pharmacy electronically.  

## 2015-08-24 ENCOUNTER — Ambulatory Visit: Payer: Medicare Other | Admitting: Cardiology

## 2015-09-18 ENCOUNTER — Other Ambulatory Visit: Payer: Self-pay | Admitting: Internal Medicine

## 2015-10-05 ENCOUNTER — Other Ambulatory Visit: Payer: Self-pay | Admitting: Family Medicine

## 2015-10-12 NOTE — Progress Notes (Signed)
This encounter was created in error - please disregard.

## 2015-10-18 DIAGNOSIS — B353 Tinea pedis: Secondary | ICD-10-CM | POA: Diagnosis not present

## 2015-10-18 DIAGNOSIS — E1159 Type 2 diabetes mellitus with other circulatory complications: Secondary | ICD-10-CM | POA: Diagnosis not present

## 2015-10-18 DIAGNOSIS — L03032 Cellulitis of left toe: Secondary | ICD-10-CM | POA: Diagnosis not present

## 2015-10-20 DIAGNOSIS — B353 Tinea pedis: Secondary | ICD-10-CM | POA: Diagnosis not present

## 2015-10-20 DIAGNOSIS — L03032 Cellulitis of left toe: Secondary | ICD-10-CM | POA: Diagnosis not present

## 2015-10-24 DIAGNOSIS — L309 Dermatitis, unspecified: Secondary | ICD-10-CM | POA: Diagnosis not present

## 2015-10-24 DIAGNOSIS — B353 Tinea pedis: Secondary | ICD-10-CM | POA: Diagnosis not present

## 2015-10-24 DIAGNOSIS — L03032 Cellulitis of left toe: Secondary | ICD-10-CM | POA: Diagnosis not present

## 2015-10-24 DIAGNOSIS — H04123 Dry eye syndrome of bilateral lacrimal glands: Secondary | ICD-10-CM | POA: Diagnosis not present

## 2015-11-28 DIAGNOSIS — N390 Urinary tract infection, site not specified: Secondary | ICD-10-CM | POA: Diagnosis not present

## 2015-11-28 DIAGNOSIS — R350 Frequency of micturition: Secondary | ICD-10-CM | POA: Diagnosis not present

## 2015-11-29 ENCOUNTER — Ambulatory Visit: Payer: Medicare Other | Admitting: Family Medicine

## 2015-12-25 ENCOUNTER — Other Ambulatory Visit: Payer: Self-pay | Admitting: Internal Medicine

## 2015-12-25 NOTE — Telephone Encounter (Signed)
Rx request sent to pharmacy.  

## 2015-12-27 ENCOUNTER — Other Ambulatory Visit: Payer: Self-pay | Admitting: Pharmacist Clinician (PhC)/ Clinical Pharmacy Specialist

## 2015-12-27 MED ORDER — APIXABAN 5 MG PO TABS: 5.0000 mg | ORAL_TABLET | Freq: Two times a day (BID) | ORAL | 0 refills | Status: DC

## 2016-01-02 ENCOUNTER — Telehealth: Payer: Self-pay | Admitting: General Practice

## 2016-01-02 ENCOUNTER — Emergency Department (HOSPITAL_COMMUNITY)
Admission: EM | Admit: 2016-01-02 | Discharge: 2016-01-02 | Disposition: A | Discharge status: Home / Self Care | Payer: Medicare Other | Attending: Emergency Medicine | Admitting: Emergency Medicine

## 2016-01-02 ENCOUNTER — Emergency Department (HOSPITAL_COMMUNITY): Payer: Medicare Other

## 2016-01-02 ENCOUNTER — Encounter (HOSPITAL_COMMUNITY): Payer: Self-pay | Admitting: Emergency Medicine

## 2016-01-02 ENCOUNTER — Other Ambulatory Visit: Payer: Self-pay | Admitting: Family Medicine

## 2016-01-02 DIAGNOSIS — N183 Chronic kidney disease, stage 3 (moderate): Secondary | ICD-10-CM | POA: Insufficient documentation

## 2016-01-02 DIAGNOSIS — Z7901 Long term (current) use of anticoagulants: Secondary | ICD-10-CM | POA: Diagnosis not present

## 2016-01-02 DIAGNOSIS — Z7982 Long term (current) use of aspirin: Secondary | ICD-10-CM | POA: Insufficient documentation

## 2016-01-02 DIAGNOSIS — S40021A Contusion of right upper arm, initial encounter: Secondary | ICD-10-CM | POA: Diagnosis not present

## 2016-01-02 DIAGNOSIS — Z8673 Personal history of transient ischemic attack (TIA), and cerebral infarction without residual deficits: Secondary | ICD-10-CM | POA: Insufficient documentation

## 2016-01-02 DIAGNOSIS — R079 Chest pain, unspecified: Secondary | ICD-10-CM | POA: Diagnosis not present

## 2016-01-02 DIAGNOSIS — E1122 Type 2 diabetes mellitus with diabetic chronic kidney disease: Secondary | ICD-10-CM | POA: Insufficient documentation

## 2016-01-02 DIAGNOSIS — R0602 Shortness of breath: Secondary | ICD-10-CM | POA: Diagnosis not present

## 2016-01-02 DIAGNOSIS — R05 Cough: Secondary | ICD-10-CM | POA: Diagnosis not present

## 2016-01-02 DIAGNOSIS — R8290 Unspecified abnormal findings in urine: Secondary | ICD-10-CM | POA: Diagnosis not present

## 2016-01-02 DIAGNOSIS — I509 Heart failure, unspecified: Secondary | ICD-10-CM | POA: Insufficient documentation

## 2016-01-02 DIAGNOSIS — R319 Hematuria, unspecified: Secondary | ICD-10-CM | POA: Diagnosis not present

## 2016-01-02 DIAGNOSIS — I129 Hypertensive chronic kidney disease with stage 1 through stage 4 chronic kidney disease, or unspecified chronic kidney disease: Secondary | ICD-10-CM | POA: Insufficient documentation

## 2016-01-02 DIAGNOSIS — M79601 Pain in right arm: Secondary | ICD-10-CM | POA: Diagnosis not present

## 2016-01-02 DIAGNOSIS — J90 Pleural effusion, not elsewhere classified: Secondary | ICD-10-CM | POA: Diagnosis not present

## 2016-01-02 DIAGNOSIS — R0789 Other chest pain: Secondary | ICD-10-CM | POA: Diagnosis not present

## 2016-01-02 DIAGNOSIS — I130 Hypertensive heart and chronic kidney disease with heart failure and stage 1 through stage 4 chronic kidney disease, or unspecified chronic kidney disease: Secondary | ICD-10-CM | POA: Diagnosis not present

## 2016-01-02 DIAGNOSIS — J811 Chronic pulmonary edema: Secondary | ICD-10-CM | POA: Diagnosis not present

## 2016-01-02 LAB — CBC WITH DIFFERENTIAL/PLATELET
Basophils Absolute: 0 10*3/uL (ref 0.0–0.1)
Basophils Relative: 0 %
EOS ABS: 0.2 10*3/uL (ref 0.0–0.7)
Eosinophils Relative: 2 %
HEMATOCRIT: 36.2 % (ref 36.0–46.0)
HEMOGLOBIN: 11.9 g/dL — AB (ref 12.0–15.0)
LYMPHS ABS: 2.1 10*3/uL (ref 0.7–4.0)
Lymphocytes Relative: 22 %
MCH: 30.1 pg (ref 26.0–34.0)
MCHC: 32.9 g/dL (ref 30.0–36.0)
MCV: 91.6 fL (ref 78.0–100.0)
MONOS PCT: 12 %
Monocytes Absolute: 1.1 10*3/uL — ABNORMAL HIGH (ref 0.1–1.0)
NEUTROS ABS: 6 10*3/uL (ref 1.7–7.7)
NEUTROS PCT: 64 %
Platelets: 230 10*3/uL (ref 150–400)
RBC: 3.95 MIL/uL (ref 3.87–5.11)
RDW: 15.5 % (ref 11.5–15.5)
WBC: 9.4 10*3/uL (ref 4.0–10.5)

## 2016-01-02 LAB — I-STAT CHEM 8, ED
BUN: 9 mg/dL (ref 6–20)
CALCIUM ION: 1.05 mmol/L — AB (ref 1.15–1.40)
CREATININE: 0.9 mg/dL (ref 0.44–1.00)
Chloride: 101 mmol/L (ref 101–111)
GLUCOSE: 146 mg/dL — AB (ref 65–99)
HCT: 37 % (ref 36.0–46.0)
Hemoglobin: 12.6 g/dL (ref 12.0–15.0)
Potassium: 3.6 mmol/L (ref 3.5–5.1)
Sodium: 139 mmol/L (ref 135–145)
TCO2: 25 mmol/L (ref 0–100)

## 2016-01-02 LAB — BRAIN NATRIURETIC PEPTIDE: B Natriuretic Peptide: 583.1 pg/mL — ABNORMAL HIGH (ref 0.0–100.0)

## 2016-01-02 LAB — I-STAT TROPONIN, ED: Troponin i, poc: 0.01 ng/mL (ref 0.00–0.08)

## 2016-01-02 MED ORDER — POTASSIUM CHLORIDE CRYS ER 20 MEQ PO TBCR: 20.0000 meq | EXTENDED_RELEASE_TABLET | Freq: Every day | ORAL | 0 refills | Status: DC

## 2016-01-02 MED ORDER — FUROSEMIDE 20 MG PO TABS: 20.0000 mg | ORAL_TABLET | Freq: Two times a day (BID) | ORAL | 0 refills | Status: DC

## 2016-01-02 NOTE — Telephone Encounter (Signed)
Spoke with pt daughter again and advised that per Dr. Birdie Riddle pt could not be scheduled (due to her being out of office) she MUST be evaluated by ER TODAY. Pt daughter states that pt kept refusing treatment. I advised we would notate that but pt has been told by Two physicians Dr. Birdie Riddle and the [facility] provider that she MUST go to the ER. Pt daughter states that she will call and tell her brother.

## 2016-01-02 NOTE — ED Notes (Addendum)
Redraw BNP notified lab.

## 2016-01-02 NOTE — ED Provider Notes (Signed)
[facility] DEPT Provider Note   CSN: [id number] Arrival date & time: 01/02/16  1415     History   Chief Complaint Chief Complaint  Patient presents with  . Pleural Effusion    sent from pcp    HPI Christina Pittman is a 82 y.o. female.  HPI Patient was sent in by her PCP. Has had some shortness of breath and was seen at urgent care. X-ray reportedly showed mild CHF. I can see report but not the actual images. After discussion with PCP she was told to come into the ER. Patient states she's had a little shortness of breath doesn't feel bad. She states she is eager to go home. No chest pain. She does have some swelling in her left leg which is not that unusual for her. She is on Lasix. Discussed with her daughter there is some question whether she's been taking the medicine as she is supposed to. Does have some level of dementia.   Past Medical History:  Diagnosis Date  . Aneurysm of thoracic aorta ([place])   . Arthritis   . Aspiration pneumonia ([place])   . Bilateral venous insufficiency   . Chicken pox   . Diabetes mellitus   . Dysphagia   . Fractured pelvis ([place]) 05/14/2011  . Gangrene of foot ([place])   . Heart murmur   . Hemiparesis, right ([place])   . Hyperlipemia   . Hypertension   . Insomnia   . Pituitary macroadenoma ([place])   . Stroke [facility])    feb 2013    Patient Active Problem List   Diagnosis Date Noted  . TIA (transient ischemic attack) 03/23/2015  . CKD (chronic kidney disease), stage III 03/23/2015  . Acute UTI 03/23/2015  . New onset atrial fibrillation ([place])   . Aortic stenosis   . Eczema 01/04/2015  . GI bleed 12/16/2013  . Acute renal insufficiency 12/16/2013  . Noninfected skin tear of left leg 10/29/2013  . Routine general medical examination at a health care facility 04/01/2013  . Impaired mobility 12/03/2012  . S/P AKA (above knee amputation) ([place]) 12/03/2012  . Skin tear of right forearm without complication [id number]  . Toe pain 05/28/2012  .  Hallucinations, visual 02/27/2012  . Allergic rhinitis 01/08/2012  . Cellulitis 12/24/2011  . Blood blister 10/25/2011  . Dysuria 10/10/2011  . Wound of left leg 07/26/2011  . Edema 07/12/2011  . Pituitary macroadenoma ([place]) 05/21/2011  . Aspiration pneumonia ([place]) 04/25/2011  . Aneurysm of thoracic aorta ([place]) 04/25/2011  . Venous insufficiency of both lower extremities 04/09/2011  . Fall at home 04/09/2011  . Hx of arterial ischemic stroke 03/14/2011  . HTN (hypertension) 03/14/2011  . DM2 (diabetes mellitus, type 2) ([place]) 03/14/2011  . Insomnia 03/14/2011  . Hemiparesis, right ([place]) 03/14/2011  . HLD (hyperlipidemia) 03/14/2011  . Dysphagia 03/14/2011    Past Surgical History:  Procedure Laterality Date  . ABDOMINAL HYSTERECTOMY    . ABOVE KNEE LEG AMPUTATION      OB History    No data available       Home Medications    Prior to Admission medications   Medication Sig Start Date End Date Taking? Authorizing Provider  apixaban (ELIQUIS) 5 MG TABS tablet Take 1 tablet (5 mg total) by mouth 2 (two) times daily. Please schedule appointment for refills. 12/27/15  Yes Pixie Casino, MD  carvedilol (COREG) 12.5 MG tablet Take 1 tablet (12.5 mg total) by mouth 2 (two) times daily. 03/27/15  Yes Nadean Corwin  Hilty, MD  NONFORMULARY OR COMPOUNDED ITEM Pt is in need of a Right AK Socket for her prosthetic. 06/02/13  Yes Midge Minium, MD  simvastatin (ZOCOR) 20 MG tablet TAKE ONE TABLET BY MOUTH ONCE DAILY AT 6PM Patient taking differently: TAKE 20 MG BY MOUTH ONCE DAILY AT 6PM 10/06/15  Yes Midge Minium, MD  aspirin 81 MG chewable tablet Chew 1 tablet (81 mg total) by mouth daily. Patient not taking: Reported on 01/02/2016 03/24/15   Caren Griffins, MD  cephALEXin (KEFLEX) 500 MG capsule Take 1 capsule (500 mg total) by mouth 4 (four) times daily. Patient not taking: Reported on 01/02/2016 03/24/15   Caren Griffins, MD  furosemide (LASIX) 20 MG tablet Take 1 tablet (20 mg  total) by mouth 2 (two) times daily. 01/02/16   Davonna Belling, MD  potassium chloride SA (K-DUR,KLOR-CON) 20 MEQ tablet Take 1 tablet (20 mEq total) by mouth daily. 01/02/16   Davonna Belling, MD    Family History Family History  Problem Relation Age of Onset  . Arthritis Mother   . Cancer Father   . Diabetes Sister   . Heart disease Brother     MI  . Diabetes Brother   . Liver disease Brother   . Heart attack Brother   . Clotting disorder Sister     blood clot    Social History Social History  Substance Use Topics  . Smoking status: Never Smoker  . Smokeless tobacco: Never Used  . Alcohol use No     Allergies   Betadine [povidone iodine]; Equalyte; Neosporin [neomycin-polymyxin-gramicidin]; and Codeine   Review of Systems Review of Systems  Constitutional: Negative for appetite change.  HENT: Negative for congestion.   Respiratory: Positive for shortness of breath.   Cardiovascular: Positive for leg swelling. Negative for chest pain.  Gastrointestinal: Negative for abdominal pain.  Genitourinary: Negative for dysuria.  Musculoskeletal: Negative for back pain.  Neurological: Negative for numbness.  Hematological: Bruises/bleeds easily.  Psychiatric/Behavioral: Negative for behavioral problems.     Physical Exam Updated Vital Signs BP (!) 175/108 (BP Location: Left Arm)   Pulse 97   Temp 97.9 F (36.6 C) (Oral)   Resp 23   SpO2 98%   Physical Exam  Constitutional: She appears well-developed.  HENT:  Head: Atraumatic.  Eyes: EOM are normal.  Neck: No JVD present.  Cardiovascular:  Murmur heard. Systolic murmur  Pulmonary/Chest:  Few scattered wheezes. No rales  Abdominal: There is no tenderness.  Musculoskeletal:  Previous right below the knee". Moderate edema of left lower leg. Several different areas of bruising. There is a circumferential bruise in the right upper arm where her blood pressure cuff had been.  Neurological: She is alert. No  cranial nerve deficit.  Skin: Skin is warm.  Psychiatric: She has a normal mood and affect.     ED Treatments / Results  Labs (all labs ordered are listed, but only abnormal results are displayed) Labs Reviewed  CBC WITH DIFFERENTIAL/PLATELET - Abnormal; Notable for the following:       Result Value   Hemoglobin 11.9 (*)    Monocytes Absolute 1.1 (*)    All other components within normal limits  BRAIN NATRIURETIC PEPTIDE - Abnormal; Notable for the following:    B Natriuretic Peptide 583.1 (*)    All other components within normal limits  I-STAT CHEM 8, ED - Abnormal; Notable for the following:    Glucose, Bld 146 (*)    Calcium, Ion 1.05 (*)  All other components within normal limits  I-STAT TROPOININ, ED    EKG  EKG Interpretation  Date/Time:  Tuesday January 02 2016 14:51:58 EST Ventricular Rate:  78 PR Interval:    QRS Duration: 139 QT Interval:  422 QTC Calculation: 481 R Axis:   57 Text Interpretation:  Atrial fibrillation Left bundle branch block No significant change since last tracing Confirmed by Alvino Chapel  MD, Ovid Curd [phone]) on 01/02/2016 3:08:32 PM       Radiology Dg Chest 2 View  Result Date: 01/02/2016 CLINICAL DATA:  Cough without chest pain or shortness of breath. History of CHF. History of an x-ray demonstrating a pleural effusion. EXAM: CHEST  2 VIEW COMPARISON:  PA and lateral chest x-ray of March 22, 2015 FINDINGS: The lungs are well-expanded. There is a new small left pleural effusion and trace right pleural effusion. The interstitial markings of both lungs are chronically increased but are slightly more conspicuous overall today. The pulmonary vascularity is more prominent. The cardiac silhouette remains enlarged. There is calcification in the wall of the thoracic aorta. There is mild multilevel degenerative disc disease of the thoracic spine. IMPRESSION: CHF superimposed upon COPD -reactive airway disease. Small bilateral pleural effusions  greatest on the right. Thoracic aortic atherosclerosis. Electronically Signed   By: David  Martinique M.D.   On: 01/02/2016 16:06    Procedures Procedures (including critical care time)  Medications Ordered in ED Medications - No data to display   Initial Impression / Assessment and Plan / ED Course  I have reviewed the triage vital signs and the nursing notes.  Pertinent labs & imaging results that were available during my care of the patient were reviewed by me and considered in my medical decision making (see chart for details).  Clinical Course   Patient presents with reported shortness of breath and CHF on x-ray. X-ray showed mild CHF. BNP is mildly elevated. Is on Lasix and will increase the dose. Vitals overall reassuring although difficult to get since the blood pressure cuff will cause her arm to bruise. Will increase the Lasix and add potassium. Will have follow-up with Dr. Birdie Riddle in 2-3 days. I think it is safe to discharge home and patient really does not want to be admitted.  Final Clinical Impressions(s) / ED Diagnoses   Final diagnoses:  Congestive heart failure, unspecified congestive heart failure chronicity, unspecified congestive heart failure type (HCC)    New Prescriptions New Prescriptions   POTASSIUM CHLORIDE SA (K-DUR,KLOR-CON) 20 MEQ TABLET    Take 1 tablet (20 mEq total) by mouth daily.     Davonna Belling, MD 01/02/16 [phone]

## 2016-01-02 NOTE — Telephone Encounter (Signed)
Agree w/ documentation- pt needs ER visit or UC needs to complete their evaluation.  Daughter was informed on 2 separate phone calls of these recommendations.

## 2016-01-02 NOTE — Telephone Encounter (Signed)
Pt daughter called the office and advised that her mother is a t a Urgent Care with her son. A Provider there heard fluid on her lungs and the pt daughter was calling to see if pt could be seen by tabori today.   Pt daughter advised that she was not with pt and did not know what urgent care she was at. Pt daughter advised that pt has been hiding her lasix and was taken to the urgent care initially due to edema in her leg.   Dr. Birdie Riddle was informed and advised pt daughter that at the minimum pt would need a chest xray and be seen either today at [facility] or at ED as the fluid on lungs and edema are signs or possible heart failure.

## 2016-01-02 NOTE — ED Notes (Signed)
Bed: WA07 Expected date:  Expected time:  Means of arrival:  Comments: Tri 1

## 2016-01-02 NOTE — Telephone Encounter (Signed)
Patient's daughter Nestor Lewandowsky) calling office again to state patient still declining to go to hospital as previously instructed.  She states the provider at [facility] is willing to send xray report to Dr. Birdie Riddle if needed.  She would like to schedule an appointment with Dr. Birdie Riddle tomorrow.  I informed patient I was not comfortable with scheduling an appointment as patient was previously instructed to go the ED.  She is requesting to speak with Dr. Virgil Benedict CMA.

## 2016-01-02 NOTE — ED Triage Notes (Signed)
Pt sent from PCP , had chest xray showing pleural effusion. Hx CHF. Pt denies chest pain nor shortness of breath . Yet reports coughing. Alert and oriented x4.

## 2016-01-02 NOTE — ED Notes (Signed)
I called main lab to come and collect lab

## 2016-01-02 NOTE — Telephone Encounter (Signed)
Agree w/ advice given.  Pt has been evaluated at [facility] and told to go to ER- which is the appropriate advice.

## 2016-01-05 ENCOUNTER — Telehealth: Payer: Self-pay | Admitting: Family Medicine

## 2016-01-05 NOTE — Telephone Encounter (Signed)
Called and spoke with pt daughter. She advised that pt will not go to er and they are hesitant to call 911. Pt daughter was advised that pt could be scheduled at 1pm with [place] PA, however if [place] and Dr. Birdie Riddle deemed the pt unwell that we would have to call EMS at that time and pt would be transported to the hospital

## 2016-01-05 NOTE — Telephone Encounter (Signed)
Pt daughter called back and states that pt refuses to come in for office visit today.

## 2016-01-05 NOTE — Telephone Encounter (Signed)
[facility] Patient Name: Christina Pittman DOB: January 31, 1919 Initial Comment Caller states mother is not making sense, talking out of head. Nurse Assessment Nurse: Andria Frames, RN, Aeriel Date/Time (Eastern Time): 01/05/2016 11:13:53 AM Confirm and document reason for call. If symptomatic, describe symptoms. ---Caller states, mother is not making sense, talking out of head. She has a UTI. He is thinking she is a little off and may be dehyrated. They told her to take two lasics pills. Does the patient have any new or worsening symptoms? ---Yes Will a triage be completed? ---Yes Related visit to physician within the last 2 weeks? ---Yes Does the PT have any chronic conditions? (i.e. diabetes, asthma, etc.) ---No Is this a behavioral health or substance abuse call? ---No Guidelines Guideline Title Affirmed Question Affirmed Notes Urinary Tract Infection on Antibiotic Follow-up Call - Female Sounds like a life-threatening emergency to the triager Final Disposition User Call EMS 911 Now Hensel, RN, Aeriel Comments Caller is not with pt. Pt is with callers brother. Nurse did attempt to call pt brother. No voice mail left. Nurse called Edd Fabian back to have her call the brother to make sure he answers the phone. Nurse also informed caller that if pt is having new onset of confusion that they should call 911. Caller states, that she is confused. That she was fine yesterday. She also may be dehydrated. Caller is just wanting her to wait until her Monday appt to be seen or see if they can see her sooner. Disagree/Comply: Disagree Disagree/Comply Reason: Disagree with instructions

## 2016-01-05 NOTE — Telephone Encounter (Signed)
Please call family.to urge them to call 911. This is per me and Dr. Birdie Riddle. If there is something acute going on causing significant confusion, she needs assessment to r/o life threatening causes. There is little we can do here in office for something this acute needing immediate intervention.

## 2016-01-05 NOTE — Telephone Encounter (Signed)
Pt daughter or son will call back to schedule appt, if pt again refused the ER.

## 2016-01-05 NOTE — Telephone Encounter (Signed)
I don't agree w/ their decision given that she was 'talking out of her head'- she (the pt) should not be able to decide if she needs to be seen.  But I cannot force the issue with the family.

## 2016-01-05 NOTE — Telephone Encounter (Signed)
Agree w/ advice needed

## 2016-01-10 ENCOUNTER — Encounter: Payer: Self-pay | Admitting: Family Medicine

## 2016-01-10 ENCOUNTER — Ambulatory Visit (INDEPENDENT_AMBULATORY_CARE_PROVIDER_SITE_OTHER): Payer: Medicare Other | Admitting: Family Medicine

## 2016-01-10 VITALS — BP 147/83 | HR 68 | Temp 97.9°F | Resp 16 | Ht 66.0 in | Wt 160.0 lb

## 2016-01-10 DIAGNOSIS — E1159 Type 2 diabetes mellitus with other circulatory complications: Secondary | ICD-10-CM

## 2016-01-10 DIAGNOSIS — I509 Heart failure, unspecified: Secondary | ICD-10-CM

## 2016-01-10 DIAGNOSIS — M79675 Pain in left toe(s): Secondary | ICD-10-CM

## 2016-01-10 LAB — LIPID PANEL
Cholesterol: 89 mg/dL (ref 0–200)
HDL: 28.9 mg/dL — AB (ref >39.00)
LDL CALC: 44 mg/dL (ref 0–99)
NONHDL: 60.38
Total CHOL/HDL Ratio: 3
Triglycerides: 81 mg/dL (ref 0.0–149.0)
VLDL: 16.2 mg/dL (ref 0.0–40.0)

## 2016-01-10 LAB — CBC WITH DIFFERENTIAL/PLATELET
BASOS ABS: 0.1 10*3/uL (ref 0.0–0.1)
Basophils Relative: 0.5 % (ref 0.0–3.0)
EOS ABS: 0.1 10*3/uL (ref 0.0–0.7)
Eosinophils Relative: 1.1 % (ref 0.0–5.0)
HEMATOCRIT: 36.6 % (ref 36.0–46.0)
HEMOGLOBIN: 12 g/dL (ref 12.0–15.0)
LYMPHS PCT: 18.6 % (ref 12.0–46.0)
Lymphs Abs: 1.9 10*3/uL (ref 0.7–4.0)
MCHC: 32.8 g/dL (ref 30.0–36.0)
MCV: 89.5 fl (ref 78.0–100.0)
MONOS PCT: 8.7 % (ref 3.0–12.0)
Monocytes Absolute: 0.9 10*3/uL (ref 0.1–1.0)
NEUTROS ABS: 7.3 10*3/uL (ref 1.4–7.7)
Neutrophils Relative %: 71.1 % (ref 43.0–77.0)
Platelets: 255 10*3/uL (ref 150.0–400.0)
RBC: 4.09 Mil/uL (ref 3.87–5.11)
RDW: 16.5 % — ABNORMAL HIGH (ref 11.5–15.5)
WBC: 10.3 10*3/uL (ref 4.0–10.5)

## 2016-01-10 LAB — TSH: TSH: 1.78 u[IU]/mL (ref 0.35–4.50)

## 2016-01-10 LAB — BASIC METABOLIC PANEL
BUN: 13 mg/dL (ref 6–23)
CALCIUM: 8.9 mg/dL (ref 8.4–10.5)
CO2: 29 meq/L (ref 19–32)
CREATININE: 0.89 mg/dL (ref 0.40–1.20)
Chloride: 103 mEq/L (ref 96–112)
GFR: 62.69 mL/min (ref >60.00)
GLUCOSE: 122 mg/dL — AB (ref 70–99)
Potassium: 5 mEq/L (ref 3.5–5.1)
SODIUM: 140 meq/L (ref 135–145)

## 2016-01-10 LAB — HEPATIC FUNCTION PANEL
ALBUMIN: 3.2 g/dL — AB (ref 3.5–5.2)
ALK PHOS: 79 U/L (ref 39–117)
ALT: 9 U/L (ref 0–35)
AST: 18 U/L (ref 0–37)
BILIRUBIN DIRECT: 0.2 mg/dL (ref 0.0–0.3)
TOTAL PROTEIN: 6.5 g/dL (ref 6.0–8.3)
Total Bilirubin: 0.7 mg/dL (ref 0.2–1.2)

## 2016-01-10 LAB — URIC ACID: Uric Acid, Serum: 7 mg/dL (ref 2.4–7.0)

## 2016-01-10 LAB — HEMOGLOBIN A1C: Hgb A1c MFr Bld: 5.8 % (ref 4.6–6.5)

## 2016-01-10 MED ORDER — PREDNISONE 10 MG PO TABS: ORAL_TABLET | ORAL | 0 refills | Status: DC

## 2016-01-10 NOTE — Progress Notes (Signed)
Pre visit review using our clinic review tool, if applicable. No additional management support is needed unless otherwise documented below in the visit note. 

## 2016-01-10 NOTE — Progress Notes (Signed)
   Subjective:    Patient ID: Christina Pittman, female    DOB: [birth year], 82 y.o.   MRN: [id number]  HPI CHF- pt was seen in ER on 12/5 w/ volume overload.  At that time, pt was advised to take Lasix twice daily and f/u w/ me in 2-3 days.  Also started K+.  She saw Dr Debara Pickett in Feb and ECHO was ordered but this was not done so no updated info on EF.  No SOB.  No CP.  Pt has hx of severe AS.  Toe pain- pt reports pain started Friday or Saturday.  Swollen on Sunday.  Swelling has improved.  Continues to have pain- inability to put pressure on toe.    DM- chronic problem.  Currently diet controlled.  UTD on microalbumin, foot exam.  Pt declines eye exam.   Review of Systems For ROS see HPI     Objective:   Physical Exam  Constitutional: She appears well-developed and well-nourished. No distress.  Elderly woman, sitting in wheel chair  HENT:  Head: Normocephalic.  Multiple resolving bruises of face  Cardiovascular: Normal rate and regular rhythm.   Murmur (II-III/VI holosystolic murmur at RUSB) heard. Pulmonary/Chest: Effort normal. No respiratory distress.  Faint crackles at bases bilaterally  Musculoskeletal: She exhibits edema (1+ pitting edema of L lower leg) and tenderness (TTP over L 1st MTP joint w/ overlying redness and swelling).  Neurological: She is alert.  Skin: Skin is warm and dry. There is erythema.  Psychiatric: She has a normal mood and affect. Her behavior is normal. Thought content normal.  Vitals reviewed.         Assessment & Plan:

## 2016-01-10 NOTE — Patient Instructions (Signed)
Follow up in 6 months to recheck diabetes/cholesterol We'll notify you of your lab results and make any changes if needed Start the Prednisone as directed- take w/ food.  Take 3 pills at the same time for 3 days, then 2 pills at the same time for 3 days and then 1 pill daily We'll call you with your cardiology appt Call with any questions or concerns Happy Holidays!!!

## 2016-01-10 NOTE — Assessment & Plan Note (Signed)
New.  Pt has hx of severe aortic stenosis and this may now be causing volume overload for pt.  Stressed need for compliance w/ Lasix as she still has edema and crackles on PE- but pt denies SOB or CP and is asymptomatic at this time.  Check labs.  Refer to cardiology as pt never had ECHO done that was ordered in March.  Pt expressed understanding and is in agreement w/ plan.

## 2016-01-10 NOTE — Assessment & Plan Note (Signed)
Chronic problem.  Currently diet controlled.  Has not required meds in quite awhile.  UTD on foot exam, microalbumin.  Pt declines eye exam.  Check labs.  Adjust tx plan prn.

## 2016-01-10 NOTE — Assessment & Plan Note (Signed)
New.  This is consistent w/ gout.  Check Uric Acid level.  Start Prednisone taper to avoid NSAIDs in elderly pt.  Reviewed supportive care and red flags that should prompt return.  Pt expressed understanding and is in agreement w/ plan.

## 2016-01-11 ENCOUNTER — Encounter: Payer: Self-pay | Admitting: General Practice

## 2016-01-16 DIAGNOSIS — W19XXXA Unspecified fall, initial encounter: Secondary | ICD-10-CM | POA: Diagnosis not present

## 2016-01-16 DIAGNOSIS — S299XXA Unspecified injury of thorax, initial encounter: Secondary | ICD-10-CM | POA: Diagnosis not present

## 2016-01-16 DIAGNOSIS — S20222A Contusion of left back wall of thorax, initial encounter: Secondary | ICD-10-CM | POA: Diagnosis not present

## 2016-01-22 ENCOUNTER — Emergency Department (HOSPITAL_BASED_OUTPATIENT_CLINIC_OR_DEPARTMENT_OTHER): Payer: Medicare Other

## 2016-01-22 ENCOUNTER — Emergency Department (HOSPITAL_BASED_OUTPATIENT_CLINIC_OR_DEPARTMENT_OTHER)
Admission: EM | Admit: 2016-01-22 | Discharge: 2016-01-22 | Disposition: A | Discharge status: Home / Self Care | Payer: Medicare Other | Attending: Emergency Medicine | Admitting: Emergency Medicine

## 2016-01-22 ENCOUNTER — Encounter (HOSPITAL_BASED_OUTPATIENT_CLINIC_OR_DEPARTMENT_OTHER): Payer: Self-pay | Admitting: Emergency Medicine

## 2016-01-22 DIAGNOSIS — S8992XA Unspecified injury of left lower leg, initial encounter: Secondary | ICD-10-CM | POA: Diagnosis present

## 2016-01-22 DIAGNOSIS — W050XXA Fall from non-moving wheelchair, initial encounter: Secondary | ICD-10-CM | POA: Diagnosis not present

## 2016-01-22 DIAGNOSIS — Y999 Unspecified external cause status: Secondary | ICD-10-CM | POA: Insufficient documentation

## 2016-01-22 DIAGNOSIS — N183 Chronic kidney disease, stage 3 (moderate): Secondary | ICD-10-CM | POA: Insufficient documentation

## 2016-01-22 DIAGNOSIS — Y9389 Activity, other specified: Secondary | ICD-10-CM | POA: Diagnosis not present

## 2016-01-22 DIAGNOSIS — I130 Hypertensive heart and chronic kidney disease with heart failure and stage 1 through stage 4 chronic kidney disease, or unspecified chronic kidney disease: Secondary | ICD-10-CM | POA: Insufficient documentation

## 2016-01-22 DIAGNOSIS — I509 Heart failure, unspecified: Secondary | ICD-10-CM | POA: Diagnosis not present

## 2016-01-22 DIAGNOSIS — S8002XA Contusion of left knee, initial encounter: Secondary | ICD-10-CM | POA: Diagnosis not present

## 2016-01-22 DIAGNOSIS — S81812A Laceration without foreign body, left lower leg, initial encounter: Secondary | ICD-10-CM | POA: Diagnosis not present

## 2016-01-22 DIAGNOSIS — Y929 Unspecified place or not applicable: Secondary | ICD-10-CM | POA: Diagnosis not present

## 2016-01-22 DIAGNOSIS — E1122 Type 2 diabetes mellitus with diabetic chronic kidney disease: Secondary | ICD-10-CM | POA: Diagnosis not present

## 2016-01-22 DIAGNOSIS — Z79899 Other long term (current) drug therapy: Secondary | ICD-10-CM | POA: Diagnosis not present

## 2016-01-22 DIAGNOSIS — M25462 Effusion, left knee: Secondary | ICD-10-CM | POA: Diagnosis not present

## 2016-01-22 MED ORDER — ONDANSETRON 4 MG PO TBDP
ORAL_TABLET | ORAL | 0 refills | Status: DC
Start: 2016-01-22 — End: 2016-02-01

## 2016-01-22 MED ORDER — PROMETHAZINE HCL 25 MG/ML IJ SOLN: 12.5000 mg | Freq: Once | INTRAMUSCULAR | Status: DC

## 2016-01-22 MED ORDER — ACETAMINOPHEN 325 MG PO TABS
650.0000 mg | ORAL_TABLET | Freq: Once | ORAL | Status: AC
  Administered 2016-01-22: 650 mg via ORAL
  Filled 2016-01-22: qty 2

## 2016-01-22 NOTE — ED Notes (Signed)
Discharge instructions reviewed with son - voiced understanding.

## 2016-01-22 NOTE — Discharge Instructions (Addendum)
Leave dressing in place until Wednesday morning.  Begin daily dressing changes Wednesday morning.  Return to the emergency department for increased bleeding, swelling, pain, redness, or pus draining from the wound.

## 2016-01-22 NOTE — ED Triage Notes (Signed)
Witnessed fall from the wheelchair, blanket got caught under the wheel about 1400 today, per EMS, on blood thinners, skin tear to left knee with active bleeding. No LOC,

## 2016-01-22 NOTE — ED Notes (Signed)
Patient denies pain and is resting comfortably.  

## 2016-01-22 NOTE — ED Provider Notes (Signed)
Christina Pittman   CSN: [id number] Arrival date & time: 01/22/16  2119     History   Chief Complaint Chief Complaint  Patient presents with  . Fall    HPI Christina Pittman is a 82 y.o. female.  HPI  Past Medical History:  Diagnosis Date  . Aneurysm of thoracic aorta ([place])   . Arthritis   . Aspiration pneumonia ([place])   . Bilateral venous insufficiency   . Chicken pox   . Diabetes mellitus   . Dysphagia   . Fractured pelvis ([place]) 05/14/2011  . Gangrene of foot ([place])   . Heart murmur   . Hemiparesis, right ([place])   . Hyperlipemia   . Hypertension   . Insomnia   . Pituitary macroadenoma ([place])   . Stroke [facility])    feb 2013    Patient Active Problem List   Diagnosis Date Noted  . CHF (congestive heart failure) ([place]) 01/10/2016  . TIA (transient ischemic attack) 03/23/2015  . CKD (chronic kidney disease), stage III 03/23/2015  . Acute UTI 03/23/2015  . New onset atrial fibrillation ([place])   . Aortic stenosis   . Eczema 01/04/2015  . GI bleed 12/16/2013  . Acute renal insufficiency 12/16/2013  . Noninfected skin tear of left leg 10/29/2013  . Routine general medical examination at a health care facility 04/01/2013  . Impaired mobility 12/03/2012  . S/P AKA (above knee amputation) ([place]) 12/03/2012  . Skin tear of right forearm without complication 123XX123  . Toe pain 05/28/2012  . Hallucinations, visual 02/27/2012  . Allergic rhinitis 01/08/2012  . Cellulitis 12/24/2011  . Blood blister 10/25/2011  . Dysuria 10/10/2011  . Wound of left leg 07/26/2011  . Edema 07/12/2011  . Pituitary macroadenoma ([place]) 05/21/2011  . Aspiration pneumonia ([place]) 04/25/2011  . Aneurysm of thoracic aorta ([place]) 04/25/2011  . Venous insufficiency of both lower extremities 04/09/2011  . Fall at home 04/09/2011  . Hx of arterial ischemic stroke 03/14/2011  . HTN (hypertension) 03/14/2011  . DM2 (diabetes mellitus, type 2) ([place]) 03/14/2011  . Insomnia 03/14/2011    . Hemiparesis, right ([place]) 03/14/2011  . HLD (hyperlipidemia) 03/14/2011  . Dysphagia 03/14/2011    Past Surgical History:  Procedure Laterality Date  . ABDOMINAL HYSTERECTOMY    . ABOVE KNEE LEG AMPUTATION      OB History    No data available       Home Medications    Prior to Admission medications   Medication Sig Start Date End Date Taking? Authorizing Provider  apixaban (ELIQUIS) 5 MG TABS tablet Take 1 tablet (5 mg total) by mouth 2 (two) times daily. Please schedule appointment for refills. 12/27/15  Yes Pixie Casino, MD  carvedilol (COREG) 12.5 MG tablet Take 1 tablet (12.5 mg total) by mouth 2 (two) times daily. 03/27/15  Yes Pixie Casino, MD  furosemide (LASIX) 20 MG tablet TAKE ONE TABLET BY MOUTH ONCE DAILY AS NEEDED FOR  SWELLING 01/03/16  Yes Midge Minium, MD  NONFORMULARY OR COMPOUNDED ITEM Pt is in need of a Right AK Socket for her prosthetic. 06/02/13  Yes Midge Minium, MD  potassium chloride SA (K-DUR,KLOR-CON) 20 MEQ tablet Take 1 tablet (20 mEq total) by mouth daily. 01/02/16  Yes Davonna Belling, MD  simvastatin (ZOCOR) 20 MG tablet TAKE ONE TABLET BY MOUTH ONCE DAILY AT 6PM Patient taking differently: TAKE 20 MG BY MOUTH ONCE DAILY AT 6PM 10/06/15  Yes Midge Minium, MD  predniSONE (DELTASONE)  10 MG tablet 3 tabs x3 days and then 2 tabs x3 days and then 1 tab x3 days.  Take w/ food. 01/10/16   Midge Minium, MD    Family History Family History  Problem Relation Age of Onset  . Arthritis Mother   . Cancer Father   . Diabetes Sister   . Heart disease Brother     MI  . Diabetes Brother   . Liver disease Brother   . Heart attack Brother   . Clotting disorder Sister     blood clot    Social History Social History  Substance Use Topics  . Smoking status: Never Smoker  . Smokeless tobacco: Current User    Types: Snuff  . Alcohol use No     Allergies   Betadine [povidone iodine]; Equalyte; Neosporin  [neomycin-polymyxin-gramicidin]; and Codeine   Review of Systems Review of Systems   Physical Exam Updated Vital Signs BP (!) 148/126 (BP Location: Right Arm)   Pulse 94   Temp 98.4 F (36.9 C) (Oral)   Resp 18   Ht 5\' 6"  (1.676 m)   Wt 160 lb (72.6 kg)   SpO2 100%   BMI 25.82 kg/m   Physical Exam   ED Treatments / Results  Labs (all labs ordered are listed, but only abnormal results are displayed) Labs Reviewed - No data to display  EKG  EKG Interpretation None       Radiology No results found.  Procedures Procedures (including critical care time)  Medications Ordered in ED Medications - No data to display   Initial Impression / Assessment and Plan / ED Course  I have reviewed the triage vital signs and the nursing notes.  Pertinent labs & imaging results that were available during my care of the patient were reviewed by me and considered in my medical decision making (see chart for details).  Clinical Course     Patient presents for evaluation of abdominal cramping, nausea and vomiting. He was at a Christmas party 2 nights ago with other children who have become ill. I highly suspect a viral etiology. He was given Zofran and is now tolerating by mouth fluids. He will be discharged with Zofran and as needed return.  Final Clinical Impressions(s) / ED Diagnoses   Final diagnoses:  None    New Prescriptions New Prescriptions   No medications on file   I personally performed the services described in this documentation, which was scribed in my presence. The recorded information has been reviewed and is accurate.      Veryl Speak, MD 01/22/16 2149

## 2016-01-22 NOTE — ED Provider Notes (Signed)
[place] DEPT [facility] Provider Note   CSN: [id number] Arrival date & time: 01/22/16  2119   By signing my name below, I, Collene Leyden, attest that this documentation has been prepared under the direction and in the presence of Veryl Speak, MD. Electronically Signed: Collene Leyden, Scribe. 01/22/16. 9:40 PM.  History   Chief Complaint Chief Complaint  Patient presents with  . Fall    HPI Comments: Christina Pittman is a 82 y.o. female with a PMHx HTN and HLD, brought in by ambulance, who presents to the Emergency Department complaining of a constant, moderate left knee pain s/p a mechanical fall that happened earlier today. Patient was holding a blanket in her hand, which got caught under the wheel of her wheel chair, pulling her out and causing her to land on her left knee. Patient is actively bleeding with a skin tear of the left  knee. The patient is on eliquis and she did not lose conscious during fall. Patient lives at home with 24 hour care.    The history is provided by the patient. No language interpreter was used.  Fall  This is a new problem. The current episode started 6 to 12 hours ago. The problem has been gradually worsening. Nothing aggravates the symptoms. Nothing relieves the symptoms. She has tried nothing for the symptoms. The treatment provided no relief.    Past Medical History:  Diagnosis Date  . Aneurysm of thoracic aorta ([place])   . Arthritis   . Aspiration pneumonia ([place])   . Bilateral venous insufficiency   . Chicken pox   . Diabetes mellitus   . Dysphagia   . Fractured pelvis ([place]) 05/14/2011  . Gangrene of foot ([place])   . Heart murmur   . Hemiparesis, right ([place])   . Hyperlipemia   . Hypertension   . Insomnia   . Pituitary macroadenoma ([place])   . Stroke [facility])    feb 2013    Patient Active Problem List   Diagnosis Date Noted  . CHF (congestive heart failure) ([place]) 01/10/2016  . TIA (transient ischemic attack) 03/23/2015  . CKD (chronic kidney  disease), stage III 03/23/2015  . Acute UTI 03/23/2015  . New onset atrial fibrillation ([place])   . Aortic stenosis   . Eczema 01/04/2015  . GI bleed 12/16/2013  . Acute renal insufficiency 12/16/2013  . Noninfected skin tear of left leg 10/29/2013  . Routine general medical examination at a health care facility 04/01/2013  . Impaired mobility 12/03/2012  . S/P AKA (above knee amputation) ([place]) 12/03/2012  . Skin tear of right forearm without complication 123XX123  . Toe pain 05/28/2012  . Hallucinations, visual 02/27/2012  . Allergic rhinitis 01/08/2012  . Cellulitis 12/24/2011  . Blood blister 10/25/2011  . Dysuria 10/10/2011  . Wound of left leg 07/26/2011  . Edema 07/12/2011  . Pituitary macroadenoma ([place]) 05/21/2011  . Aspiration pneumonia ([place]) 04/25/2011  . Aneurysm of thoracic aorta ([place]) 04/25/2011  . Venous insufficiency of both lower extremities 04/09/2011  . Fall at home 04/09/2011  . Hx of arterial ischemic stroke 03/14/2011  . HTN (hypertension) 03/14/2011  . DM2 (diabetes mellitus, type 2) ([place]) 03/14/2011  . Insomnia 03/14/2011  . Hemiparesis, right ([place]) 03/14/2011  . HLD (hyperlipidemia) 03/14/2011  . Dysphagia 03/14/2011    Past Surgical History:  Procedure Laterality Date  . ABDOMINAL HYSTERECTOMY    . ABOVE KNEE LEG AMPUTATION      OB History    No data available  Home Medications    Prior to Admission medications   Medication Sig Start Date End Date Taking? Authorizing Provider  apixaban (ELIQUIS) 5 MG TABS tablet Take 1 tablet (5 mg total) by mouth 2 (two) times daily. Please schedule appointment for refills. 12/27/15   Pixie Casino, MD  carvedilol (COREG) 12.5 MG tablet Take 1 tablet (12.5 mg total) by mouth 2 (two) times daily. 03/27/15   Pixie Casino, MD  furosemide (LASIX) 20 MG tablet TAKE ONE TABLET BY MOUTH ONCE DAILY AS NEEDED FOR  SWELLING 01/03/16   Midge Minium, MD  NONFORMULARY OR COMPOUNDED ITEM Pt is in need of a  Right AK Socket for her prosthetic. 06/02/13   Midge Minium, MD  potassium chloride SA (K-DUR,KLOR-CON) 20 MEQ tablet Take 1 tablet (20 mEq total) by mouth daily. 01/02/16   Davonna Belling, MD  predniSONE (DELTASONE) 10 MG tablet 3 tabs x3 days and then 2 tabs x3 days and then 1 tab x3 days.  Take w/ food. 01/10/16   Midge Minium, MD  simvastatin (ZOCOR) 20 MG tablet TAKE ONE TABLET BY MOUTH ONCE DAILY AT 6PM Patient taking differently: TAKE 20 MG BY MOUTH ONCE DAILY AT [facility] 10/06/15   Midge Minium, MD    Family History Family History  Problem Relation Age of Onset  . Arthritis Mother   . Cancer Father   . Diabetes Sister   . Heart disease Brother     MI  . Diabetes Brother   . Liver disease Brother   . Heart attack Brother   . Clotting disorder Sister     blood clot    Social History Social History  Substance Use Topics  . Smoking status: Never Smoker  . Smokeless tobacco: Never Used  . Alcohol use No     Allergies   Betadine [povidone iodine]; Equalyte; Neosporin [neomycin-polymyxin-gramicidin]; and Codeine   Review of Systems Review of Systems  All other systems reviewed and are negative.    Physical Exam Updated Vital Signs BP (!) 148/126 (BP Location: Right Arm)   Pulse 94   Temp 98.4 F (36.9 C) (Oral)   Resp 18   Ht 5\' 6"  (1.676 m)   Wt 160 lb (72.6 kg)   SpO2 100%   BMI 25.82 kg/m   Physical Exam  Constitutional: She is oriented to person, place, and time. She appears well-developed and well-nourished. No distress.  HENT:  Head: Normocephalic and atraumatic.  Eyes: EOM are normal.  Neck: Normal range of motion.  Cardiovascular: Normal rate, regular rhythm and normal heart sounds.   Pulmonary/Chest: Effort normal and breath sounds normal.  Abdominal: Soft. She exhibits no distension. There is no tenderness.  Musculoskeletal: Normal range of motion.  Left knee is swollen with a skin tear present.  There is an on-going trickle of  blood, she is able to move the knee without significant difficulty.  DP pulses are palpable.  There are findings consistent with venous stasis.    Right leg is s/p AKA   Neurological: She is alert and oriented to person, place, and time.  Skin: Skin is warm and dry.  Psychiatric: She has a normal mood and affect. Judgment normal.  Nursing note and vitals reviewed.    ED Treatments / Results  DIAGNOSTIC STUDIES: Oxygen Saturation is 100% on RA, normal by my interpretation.    COORDINATION OF CARE: 9:29 PM Discussed treatment plan with pt at bedside and pt agreed to plan.  Labs (all  labs ordered are listed, but only abnormal results are displayed) Labs Reviewed - No data to display  EKG  EKG Interpretation None       Radiology No results found.  Procedures Procedures (including critical care time)  Medications Ordered in ED Medications - No data to display   Initial Impression / Assessment and Plan / ED Course  I have reviewed the triage vital signs and the nursing notes.  Pertinent labs & imaging results that were available during my care of the patient were reviewed by me and considered in my medical decision making (see chart for details).  Clinical Course     Patient brought here by EMS after a fall earlier today. She tripped over a blanket getting out of her wheelchair and caused a skin tear to her left knee. She was brought for evaluation of continued bleeding from the skin tear which occurred this morning. She is taking Eliquis. X-rays were performed which were negative for fracture. A dressing was applied using quick clot dressing with good results. She will be discharged with dressing changes once daily.  The patient lives at home, but has 24/7 caregivers. To return as needed for any problems.  Final Clinical Impressions(s) / ED Diagnoses   Final diagnoses:  None    New Prescriptions New Prescriptions   No medications on file   I personally  performed the services described in this documentation, which was scribed in my presence. The recorded information has been reviewed and is accurate.       Veryl Speak, MD 01/22/16 [phone]

## 2016-01-22 NOTE — ED Notes (Signed)
Will continue to observe dressing before discharging

## 2016-01-22 NOTE — ED Notes (Signed)
No bleeding noted to the dressing

## 2016-01-25 ENCOUNTER — Ambulatory Visit: Payer: Medicare Other | Admitting: Student

## 2016-01-25 NOTE — Progress Notes (Deleted)
Cardiology Office Note    Date:  01/25/2016   ID:  Christina Pittman, DOB Jul 13, 1919, MRN [id number]  PCP:  Annye Asa, MD  Cardiologist:  Dr. Debara Pickett   No chief complaint on file.   History of Present Illness:    Christina Pittman is a 82 y.o. female with past medical history of PAD (s/p R AKA), moderate to severe AS (by echo in 03/2011), PAF (on Eliquis), HLD, and Stage 3 CKD who presents to the office today for evaluation of ***  She was last seen by Dr. Debara Pickett in 03/2015. Her aortic stenosis was thought to be severe by examination and a repeat echocardiogram was ordered but not yet obtained by review of records. Discussions were held with the patient and her family regarding palliative care due to her significant comorbidities and advanced age.   She was seen in the ER earlier this month for concerns of CHF. BNP was elevated to 583 and CXR showed CHF superimposed upon COPD with small bilateral pleural effusions. Therefore her Lasix was increased from 20mg  BID to ***  Was again seen in the ER on 12/25 following a fall, as a blanket was caught under the wheel of her chair and pulled her forward, causing her to hit her left knee. Imaging showed no acute fractures, therefore she was discharged back home.     Past Medical History:  Diagnosis Date  . Aneurysm of thoracic aorta ([place])   . Arthritis   . Aspiration pneumonia ([place])   . Bilateral venous insufficiency   . Chicken pox   . Diabetes mellitus   . Dysphagia   . Fractured pelvis ([place]) 05/14/2011  . Gangrene of foot ([place])   . Heart murmur   . Hemiparesis, right ([place])   . Hyperlipemia   . Hypertension   . Insomnia   . Pituitary macroadenoma ([place])   . Stroke [facility])    feb 2013    Past Surgical History:  Procedure Laterality Date  . ABDOMINAL HYSTERECTOMY    . ABOVE KNEE LEG AMPUTATION      Current Medications: Outpatient Medications Prior to Visit  Medication Sig Dispense Refill  . apixaban (ELIQUIS) 5 MG TABS tablet Take  1 tablet (5 mg total) by mouth 2 (two) times daily. Please schedule appointment for refills. 30 tablet 0  . carvedilol (COREG) 12.5 MG tablet Take 1 tablet (12.5 mg total) by mouth 2 (two) times daily. 60 tablet 6  . furosemide (LASIX) 20 MG tablet TAKE ONE TABLET BY MOUTH ONCE DAILY AS NEEDED FOR  SWELLING 30 tablet 3  . NONFORMULARY OR COMPOUNDED ITEM Pt is in need of a Right AK Socket for her prosthetic. 1 each 0  . ondansetron (ZOFRAN ODT) 4 MG disintegrating tablet 4mg  ODT q4 hours prn nausea/vomit 6 tablet 0  . potassium chloride SA (K-DUR,KLOR-CON) 20 MEQ tablet Take 1 tablet (20 mEq total) by mouth daily. 3 tablet 0  . predniSONE (DELTASONE) 10 MG tablet 3 tabs x3 days and then 2 tabs x3 days and then 1 tab x3 days.  Take w/ food. 18 tablet 0  . simvastatin (ZOCOR) 20 MG tablet TAKE ONE TABLET BY MOUTH ONCE DAILY AT 6PM (Patient taking differently: TAKE 20 MG BY MOUTH ONCE DAILY AT 6PM) 30 tablet 6   No facility-administered medications prior to visit.      Allergies:   Betadine [povidone iodine]; Equalyte; Neosporin [neomycin-polymyxin-gramicidin]; and Codeine   Social History   Social History  . Marital status: Widowed  Spouse name: N/A  . Number of children: 3  . Years of education: 9th   Occupational History  . farmer    Social History Main Topics  . Smoking status: Never Smoker  . Smokeless tobacco: Current User    Types: Snuff  . Alcohol use No  . Drug use: No  . Sexual activity: No   Other Topics Concern  . Not on file   Social History Narrative   epworth sleepiness scale = 16 (03/27/2015)     Family History:  The patient's ***family history includes Arthritis in her mother; Cancer in her father; Clotting disorder in her sister; Diabetes in her brother and sister; Heart attack in her brother; Heart disease in her brother; Liver disease in her brother.   Review of Systems:   Please see the history of present illness.     General:  No chills, fever, night  sweats or weight changes.  Cardiovascular:  No chest pain, dyspnea on exertion, edema, orthopnea, palpitations, paroxysmal nocturnal dyspnea. Dermatological: No rash, lesions/masses Respiratory: No cough, dyspnea Urologic: No hematuria, dysuria Abdominal:   No nausea, vomiting, diarrhea, bright red blood per rectum, melena, or hematemesis Neurologic:  No visual changes, wkns, changes in mental status. All other systems reviewed and are otherwise negative except as noted above.   Physical Exam:    VS:  There were no vitals taken for this visit.   General: Well developed, well nourished,female appearing in no acute distress. Head: Normocephalic, atraumatic, sclera non-icteric, no xanthomas, nares are without discharge.  Neck: No carotid bruits. JVD not elevated.  Lungs: Respirations regular and unlabored, without wheezes or rales.  Heart: ***Regular rate and rhythm. No S3 or S4.  No murmur, no rubs, or gallops appreciated. Abdomen: Soft, non-tender, non-distended with normoactive bowel sounds. No hepatomegaly. No rebound/guarding. No obvious abdominal masses. Msk:  Strength and tone appear normal for age. No joint deformities or effusions. Extremities: No clubbing or cyanosis. No edema.  Distal pedal pulses are 2+ bilaterally. Neuro: Alert and oriented X 3. Moves all extremities spontaneously. No focal deficits noted. Psych:  Responds to questions appropriately with a normal affect. Skin: No rashes or lesions noted  Wt Readings from Last 3 Encounters:  01/22/16 160 lb (72.6 kg)  01/10/16 160 lb (72.6 kg)  03/22/15 160 lb (72.6 kg)        Studies/Labs Reviewed:   EKG:  EKG is*** ordered today.  The ekg ordered today demonstrates ***  Recent Labs: 01/02/2016: B Natriuretic Peptide 583.1 01/10/2016: ALT 9; BUN 13; Creatinine, Ser 0.89; Hemoglobin 12.0; Platelets 255.0; Potassium 5.0; Sodium 140; TSH 1.78   Lipid Panel    Component Value Date/Time   CHOL 89 01/10/2016 1339    TRIG 81.0 01/10/2016 1339   HDL 28.90 (L) 01/10/2016 1339   CHOLHDL 3 01/10/2016 1339   VLDL 16.2 01/10/2016 1339   LDLCALC 44 01/10/2016 1339    Additional studies/ records that were reviewed today include:   Echocardiogram: 03/2011 Study Conclusions  - Left ventricle: Wall thickness was increased in a pattern of moderate LVH. Systolic function was vigorous. The estimated ejection fraction was in the range of 65% to 70%. There was an increased relative contribution of atrial contraction to ventricular filling. - Aortic valve: There was moderate to severe stenosis. Valve area: 0.85cm^2(VTI). Valve area: 0.78cm^2 (Vmax). - Mitral valve: Calcified annulus.  Assessment:    No diagnosis found.   Plan:   In order of problems listed above:  1. ***  Medication Adjustments/Labs and Tests Ordered: Current medicines are reviewed at length with the patient today.  Concerns regarding medicines are outlined above.  Medication changes, Labs and Tests ordered today are listed in the Patient Instructions below. There are no Patient Instructions on file for this visit.   Signed, Erma Heritage, PA  01/25/2016 8:25 PM    [place], [place], [place]  [address] Phone: [phone]; Fax: [phone]  [address] [place], [place] [address] Phone: [phone]

## 2016-01-26 ENCOUNTER — Ambulatory Visit: Payer: Medicare Other | Admitting: Student

## 2016-01-28 ENCOUNTER — Other Ambulatory Visit: Payer: Self-pay | Admitting: Internal Medicine

## 2016-01-30 ENCOUNTER — Telehealth: Payer: Self-pay | Admitting: Internal Medicine

## 2016-01-30 MED ORDER — APIXABAN 5 MG PO TABS: 5.0000 mg | ORAL_TABLET | Freq: Two times a day (BID) | ORAL | 2 refills | Status: DC

## 2016-01-30 NOTE — Telephone Encounter (Signed)
Rx has been sent to the pharmacy electronically. ° °

## 2016-01-30 NOTE — Telephone Encounter (Signed)
Prescription sent to [facility] for 30 tabs + 2 refills

## 2016-01-30 NOTE — Telephone Encounter (Signed)
New Message   *STAT* If patient is at the pharmacy, call can be transferred to refill team.   1. Which medications need to be refilled? (please list name of each medication and dose if known) eliquis 5mg    2. Which pharmacy/location (including street and city if local pharmacy) is medication to be sent to? [facility] [place] [facility] [place]  3. Do they need a 30 day or 90 day supply? Per pt daughter 62

## 2016-01-31 ENCOUNTER — Encounter: Payer: Self-pay | Admitting: Physician Assistant

## 2016-01-31 ENCOUNTER — Ambulatory Visit: Payer: Medicare Other | Admitting: Physician Assistant

## 2016-01-31 ENCOUNTER — Ambulatory Visit (INDEPENDENT_AMBULATORY_CARE_PROVIDER_SITE_OTHER): Payer: Medicare HMO | Admitting: Physician Assistant

## 2016-01-31 VITALS — BP 108/60 | HR 81 | Temp 97.6°F | Resp 16

## 2016-01-31 DIAGNOSIS — J208 Acute bronchitis due to other specified organisms: Secondary | ICD-10-CM | POA: Diagnosis not present

## 2016-01-31 DIAGNOSIS — S8002XD Contusion of left knee, subsequent encounter: Secondary | ICD-10-CM | POA: Diagnosis not present

## 2016-01-31 DIAGNOSIS — B9689 Other specified bacterial agents as the cause of diseases classified elsewhere: Secondary | ICD-10-CM | POA: Diagnosis not present

## 2016-01-31 MED ORDER — DOXYCYCLINE HYCLATE 100 MG PO CAPS
100.0000 mg | ORAL_CAPSULE | Freq: Two times a day (BID) | ORAL | 0 refills | Status: DC
Start: 2016-01-31 — End: 2016-07-08

## 2016-01-31 NOTE — Patient Instructions (Signed)
Please stay hydrated. Take the antibiotic as directed. Use plain Mucinex to help thin out congestion.  Plain Delsym if needed for cough. Warm liquids may also help with this.  Please keep leg elevated while resting. Keep skin clean and dry, using wamr water and mild soap.  Follow-up on Monday. If anything worsens, please return to clinic immediately or go to the ER.

## 2016-01-31 NOTE — Progress Notes (Signed)
Patient presents to clinic today with family c/o 2 weeks of cough that has started to become productive of sputum. Denies fever, chills, shortness of breath or chest pain. Denies sinus symptoms.   Of note patient recently seen in ER on 01/22/16 for skin tear of L knee s/p fall at home. ER workup included x-ray that were negative. Family has been keeping the area clean and dry. Note significant bleeding at time of fall with swelling on anterior knee that is improving daily. Patient notes tenderness but denies redness, warmth or drainage.  Past Medical History:  Diagnosis Date  . Aneurysm of thoracic aorta ([place])   . Arthritis   . Aspiration pneumonia ([place])   . Bilateral venous insufficiency   . Chicken pox   . Diabetes mellitus   . Dysphagia   . Fractured pelvis ([place]) 05/14/2011  . Gangrene of foot ([place])   . Heart murmur   . Hemiparesis, right ([place])   . Hyperlipemia   . Hypertension   . Insomnia   . Pituitary macroadenoma ([place])   . Stroke [facility])    feb 2013    Current Outpatient Prescriptions on File Prior to Visit  Medication Sig Dispense Refill  . apixaban (ELIQUIS) 5 MG TABS tablet Take 1 tablet (5 mg total) by mouth 2 (two) times daily. 30 tablet 2  . carvedilol (COREG) 12.5 MG tablet Take 1 tablet (12.5 mg total) by mouth 2 (two) times daily. 60 tablet 6  . furosemide (LASIX) 20 MG tablet TAKE ONE TABLET BY MOUTH ONCE DAILY AS NEEDED FOR  SWELLING 30 tablet 3  . NONFORMULARY OR COMPOUNDED ITEM Pt is in need of a Right AK Socket for her prosthetic. 1 each 0  . ondansetron (ZOFRAN ODT) 4 MG disintegrating tablet 4mg  ODT q4 hours prn nausea/vomit 6 tablet 0  . potassium chloride SA (K-DUR,KLOR-CON) 20 MEQ tablet Take 1 tablet (20 mEq total) by mouth daily. 3 tablet 0  . simvastatin (ZOCOR) 20 MG tablet TAKE ONE TABLET BY MOUTH ONCE DAILY AT 6PM (Patient taking differently: TAKE 20 MG BY MOUTH ONCE DAILY AT 6PM) 30 tablet 6   No current facility-administered medications on file  prior to visit.     Allergies  Allergen Reactions  . Betadine [Povidone Iodine] Hives    Because of a history of documented adverse serious drug reaction;Medi Alert bracelet  is recommended  . Equalyte Hives    Medi Alert bracelet  is recommended if verified . Drug not found in reference texts ([facility] or [facility])  . Neosporin [Neomycin-Polymyxin-Gramicidin] Hives    Medi Alert bracelet  is recommended  . Codeine Other (See Comments)    Intolerance     Family History  Problem Relation Age of Onset  . Arthritis Mother   . Cancer Father   . Diabetes Sister   . Heart disease Brother     MI  . Diabetes Brother   . Liver disease Brother   . Heart attack Brother   . Clotting disorder Sister     blood clot    Social History   Social History  . Marital status: Widowed    Spouse name: N/A  . Number of children: 3  . Years of education: 9th   Occupational History  . farmer    Social History Main Topics  . Smoking status: Never Smoker  . Smokeless tobacco: Current User    Types: Snuff  . Alcohol use No  . Drug use: No  . Sexual  activity: No   Other Topics Concern  . None   Social History Narrative   epworth sleepiness scale = 16 (03/27/2015)    Review of Systems - See HPI.  All other ROS are negative.  BP 108/60   Pulse 81   Temp 97.6 F (36.4 C) (Oral)   Resp 16   SpO2 97%   Physical Exam  Constitutional: She is oriented to person, place, and time. No distress.  Cachectic   HENT:  Head: Normocephalic and atraumatic.  Right Ear: External ear normal.  Left Ear: External ear normal.  Nose: Nose normal.  Mouth/Throat: Oropharynx is clear and moist. No oropharyngeal exudate.  TM within normal limits.   Eyes: Conjunctivae are normal.  Neck: Neck supple.  Cardiovascular: Normal rate, regular rhythm, normal heart sounds and intact distal pulses.   Pulmonary/Chest: Effort normal and breath sounds normal. No respiratory distress. She has no wheezes.  She has no rales. She exhibits no tenderness.  Musculoskeletal:       Left knee: She exhibits normal range of motion.       Legs: Neurological: She is alert and oriented to person, place, and time.  Skin: Skin is warm and dry. She is not diaphoretic.  Vitals reviewed.   Recent Results (from the past 2160 hour(s))  CBC with Differential     Status: Abnormal   Collection Time: 01/02/16  2:55 PM  Result Value Ref Range   WBC 9.4 4.0 - 10.5 K/uL   RBC 3.95 3.87 - 5.11 MIL/uL   Hemoglobin 11.9 (L) 12.0 - 15.0 g/dL   HCT 36.2 36.0 - 46.0 %   MCV 91.6 78.0 - 100.0 fL   MCH 30.1 26.0 - 34.0 pg   MCHC 32.9 30.0 - 36.0 g/dL   RDW 15.5 11.5 - 15.5 %   Platelets 230 150 - 400 K/uL   Neutrophils Relative % 64 %   Neutro Abs 6.0 1.7 - 7.7 K/uL   Lymphocytes Relative 22 %   Lymphs Abs 2.1 0.7 - 4.0 K/uL   Monocytes Relative 12 %   Monocytes Absolute 1.1 (H) 0.1 - 1.0 K/uL   Eosinophils Relative 2 %   Eosinophils Absolute 0.2 0.0 - 0.7 K/uL   Basophils Relative 0 %   Basophils Absolute 0.0 0.0 - 0.1 K/uL  I-stat troponin, ED     Status: None   Collection Time: 01/02/16  3:33 PM  Result Value Ref Range   Troponin i, poc 0.01 0.00 - 0.08 ng/mL   Comment 3            Comment: Due to the release kinetics of cTnI, a negative result within the first hours of the onset of symptoms does not rule out myocardial infarction with certainty. If myocardial infarction is still suspected, repeat the test at appropriate intervals.   I-stat Chem 8, ED     Status: Abnormal   Collection Time: 01/02/16  3:36 PM  Result Value Ref Range   Sodium 139 135 - 145 mmol/L   Potassium 3.6 3.5 - 5.1 mmol/L   Chloride 101 101 - 111 mmol/L   BUN 9 6 - 20 mg/dL   Creatinine, Ser 0.90 0.44 - 1.00 mg/dL   Glucose, Bld 146 (H) 65 - 99 mg/dL   Calcium, Ion 1.05 (L) 1.15 - 1.40 mmol/L   TCO2 25 0 - 100 mmol/L   Hemoglobin 12.6 12.0 - 15.0 g/dL   HCT 37.0 36.0 - 46.0 %  Brain natriuretic peptide  Status:  Abnormal   Collection Time: 01/02/16  3:58 PM  Result Value Ref Range   B Natriuretic Peptide 583.1 (H) 0.0 - 100.0 pg/mL  Hemoglobin A1c     Status: None   Collection Time: 01/10/16  1:39 PM  Result Value Ref Range   Hgb A1c MFr Bld 5.8 4.6 - 6.5 %    Comment: Glycemic Control Guidelines for People with Diabetes:Non Diabetic:  <6%Goal of Therapy: <7%Additional Action Suggested:  >8%   Lipid panel     Status: Abnormal   Collection Time: 01/10/16  1:39 PM  Result Value Ref Range   Cholesterol 89 0 - 200 mg/dL    Comment: ATP III Classification       Desirable:  < 200 mg/dL               Borderline High:  200 - 239 mg/dL          High:  > = 240 mg/dL   Triglycerides 81.0 0.0 - 149.0 mg/dL    Comment: Normal:  <150 mg/dLBorderline High:  150 - 199 mg/dL   HDL 28.90 (L) >39.00 mg/dL   VLDL 16.2 0.0 - 40.0 mg/dL   LDL Cholesterol 44 0 - 99 mg/dL   Total CHOL/HDL Ratio 3     Comment:                Men          Women1/2 Average Risk     3.4          3.3Average Risk          5.0          4.42X Average Risk          9.6          7.13X Average Risk          15.0          11.0                       NonHDL 60.38     Comment: NOTE:  Non-HDL goal should be 30 mg/dL higher than patient's LDL goal (i.e. LDL goal of < 70 mg/dL, would have non-HDL goal of < 100 mg/dL)  Basic metabolic panel     Status: Abnormal   Collection Time: 01/10/16  1:39 PM  Result Value Ref Range   Sodium 140 135 - 145 mEq/L   Potassium 5.0 3.5 - 5.1 mEq/L   Chloride 103 96 - 112 mEq/L   CO2 29 19 - 32 mEq/L   Glucose, Bld 122 (H) 70 - 99 mg/dL   BUN 13 6 - 23 mg/dL   Creatinine, Ser 0.89 0.40 - 1.20 mg/dL   Calcium 8.9 8.4 - 10.5 mg/dL   GFR 62.69 >60.00 mL/min  Hepatic function panel     Status: Abnormal   Collection Time: 01/10/16  1:39 PM  Result Value Ref Range   Total Bilirubin 0.7 0.2 - 1.2 mg/dL   Bilirubin, Direct 0.2 0.0 - 0.3 mg/dL   Alkaline Phosphatase 79 39 - 117 U/L   AST 18 0 - 37 U/L   ALT 9 0 - 35  U/L   Total Protein 6.5 6.0 - 8.3 g/dL   Albumin 3.2 (L) 3.5 - 5.2 g/dL  TSH     Status: None   Collection Time: 01/10/16  1:39 PM  Result Value Ref Range   TSH 1.78 0.35 - 4.50 uIU/mL  CBC  with Differential/Platelet     Status: Abnormal   Collection Time: 01/10/16  1:39 PM  Result Value Ref Range   WBC 10.3 4.0 - 10.5 K/uL   RBC 4.09 3.87 - 5.11 Mil/uL   Hemoglobin 12.0 12.0 - 15.0 g/dL   HCT 36.6 36.0 - 46.0 %   MCV 89.5 78.0 - 100.0 fl   MCHC 32.8 30.0 - 36.0 g/dL   RDW 16.5 (H) 11.5 - 15.5 %   Platelets 255.0 150.0 - 400.0 K/uL   Neutrophils Relative % 71.1 43.0 - 77.0 %   Lymphocytes Relative 18.6 12.0 - 46.0 %   Monocytes Relative 8.7 3.0 - 12.0 %   Eosinophils Relative 1.1 0.0 - 5.0 %   Basophils Relative 0.5 0.0 - 3.0 %   Neutro Abs 7.3 1.4 - 7.7 K/uL   Lymphs Abs 1.9 0.7 - 4.0 K/uL   Monocytes Absolute 0.9 0.1 - 1.0 K/uL   Eosinophils Absolute 0.1 0.0 - 0.7 K/uL   Basophils Absolute 0.1 0.0 - 0.1 K/uL  Uric acid     Status: None   Collection Time: 01/10/16  1:39 PM  Result Value Ref Range   Uric Acid, Serum 7.0 2.4 - 7.0 mg/dL    Assessment/Plan: 1. Acute bacterial bronchitis Start Doxycycline. Supportive measures and OTC medications reviewed. Close FU scheduled. Alarm signs/symptoms discussed with family that would prompt ER assessment.   - doxycycline (VIBRAMYCIN) 100 MG capsule; Take 1 capsule (100 mg total) by mouth 2 (two) times daily.  Dispense: 14 capsule; Refill: 0  2. Traumatic hematoma of left knee, subsequent encounter Ice, elevation. Small area of skin tear with some sign of developing cellulitis. Doxy should cover this. Wound care reiterated. FU scheduled.    Leeanne Rio, PA-C

## 2016-01-31 NOTE — Progress Notes (Signed)
Pre visit review using our clinic review tool, if applicable. No additional management support is needed unless otherwise documented below in the visit note. 

## 2016-02-01 ENCOUNTER — Telehealth: Payer: Self-pay | Admitting: Physician Assistant

## 2016-02-01 MED ORDER — ONDANSETRON 4 MG PO TBDP: ORAL_TABLET | ORAL | 0 refills | Status: DC

## 2016-02-01 NOTE — Telephone Encounter (Signed)
Spoke with son to see how patient is doing this morning. States she was doing well. Had a small episode of vomiting this morning but had taken medication without food. No new or worsening symptoms. Encouraged good hydration and to take medication with food. Rx zofran for nausea. FU scheduled. Will call to check on her tomorrow. Discussed return precautions and ER precautions. FU is scheduled in office for next week.

## 2016-02-02 NOTE — Telephone Encounter (Signed)
Please call to check on Christina Pittman this morning to see how she is feeling.

## 2016-02-02 NOTE — Telephone Encounter (Signed)
Spoke with patient son [name] of how patient is doing. He states she is starting to eat and drink a little more since taking the Zofran. Advised to continue abx for the bronchitis. Encouraged to eat small frequent meals and continue increasing fluids. Patient son wanted [place] to recheck on patient on Monday.

## 2016-02-05 ENCOUNTER — Ambulatory Visit: Payer: Medicare Other | Admitting: Physician Assistant

## 2016-02-05 NOTE — Progress Notes (Deleted)
Cardiology Office Note    Date:  02/05/2016   ID:  Christina Pittman, DOB 04/06/1919, MRN [id number]  PCP:  Annye Asa, MD  Cardiologist:  Dr. Debara Pickett  No chief complaint on file.   History of Present Illness:  Christina Pittman is a 83 y.o. female with past medical history of PAD (s/p R AKA), moderate to severe AS (by echo in 03/2011), PAF (on Eliquis), HLD, and Stage 3 CKD who presents to the office today for evaluation of heart failure.   She was last seen by Dr. Debara Pickett in 03/2015. Her aortic stenosis was thought to be severe by examination and a repeat echocardiogram was ordered but not yet obtained by review of records. She was felt not to be a candidate for surgery given her advanced age and comorbidities. Discussions were held with the patient and her family regarding palliative care due to her significant comorbidities and advanced age. She was diagnosed with new atrial fibrillation with controlled ventricular rate along with right bundle branch block in February when she was admitted to the hospital for TIA. She was started on eliquis at the time. Her previous Plavix was discontinued. Her family preferred a 2-D echo as outpatient, again this was never done.   She was seen in the ER on 01/02/2016 for concerns of CHF. BNP was elevated to 583 and CXR showed CHF superimposed upon COPD with small bilateral pleural effusions. Therefore her Lasix was increased. She was again seen in the ER on 12/25 following a fall, as a blanket was caught under the wheel of her chair and pulled her forward, causing her to hit her left knee. Imaging showed no acute fractures, therefore she was discharged back home. She was supposed to follow-up with cardiology service on 01/26/2016, however she was a no-show. She presents today for follow-up.  No EKG    Past Medical History:  Diagnosis Date  . Aneurysm of thoracic aorta ([place])   . Arthritis   . Aspiration pneumonia ([place])   . Bilateral venous insufficiency   .  Chicken pox   . Diabetes mellitus   . Dysphagia   . Fractured pelvis ([place]) 05/14/2011  . Gangrene of foot ([place])   . Heart murmur   . Hemiparesis, right ([place])   . Hyperlipemia   . Hypertension   . Insomnia   . Pituitary macroadenoma ([place])   . Stroke [facility])    feb 2013    Past Surgical History:  Procedure Laterality Date  . ABDOMINAL HYSTERECTOMY    . ABOVE KNEE LEG AMPUTATION      Current Medications: Outpatient Medications Prior to Visit  Medication Sig Dispense Refill  . apixaban (ELIQUIS) 5 MG TABS tablet Take 1 tablet (5 mg total) by mouth 2 (two) times daily. 30 tablet 2  . aspirin EC 81 MG tablet Take 81 mg by mouth daily.    . carvedilol (COREG) 12.5 MG tablet Take 1 tablet (12.5 mg total) by mouth 2 (two) times daily. 60 tablet 6  . doxycycline (VIBRAMYCIN) 100 MG capsule Take 1 capsule (100 mg total) by mouth 2 (two) times daily. 14 capsule 0  . furosemide (LASIX) 20 MG tablet TAKE ONE TABLET BY MOUTH ONCE DAILY AS NEEDED FOR  SWELLING 30 tablet 3  . NONFORMULARY OR COMPOUNDED ITEM Pt is in need of a Right AK Socket for her prosthetic. 1 each 0  . ondansetron (ZOFRAN ODT) 4 MG disintegrating tablet 4mg  ODT q4 hours prn nausea/vomit 10 tablet 0  . potassium  chloride SA (K-DUR,KLOR-CON) 20 MEQ tablet Take 1 tablet (20 mEq total) by mouth daily. 3 tablet 0  . simvastatin (ZOCOR) 20 MG tablet TAKE ONE TABLET BY MOUTH ONCE DAILY AT 6PM (Patient taking differently: TAKE 20 MG BY MOUTH ONCE DAILY AT 6PM) 30 tablet 6   No facility-administered medications prior to visit.      Allergies:   Betadine [povidone iodine]; Equalyte; Neosporin [neomycin-polymyxin-gramicidin]; and Codeine   Social History   Social History  . Marital status: Widowed    Spouse name: N/A  . Number of children: 3  . Years of education: 9th   Occupational History  . farmer    Social History Main Topics  . Smoking status: Never Smoker  . Smokeless tobacco: Current User    Types: Snuff  . Alcohol  use No  . Drug use: No  . Sexual activity: No   Other Topics Concern  . Not on file   Social History Narrative   epworth sleepiness scale = 16 (03/27/2015)     Family History:  The patient's ***family history includes Arthritis in her mother; Cancer in her father; Clotting disorder in her sister; Diabetes in her brother and sister; Heart attack in her brother; Heart disease in her brother; Liver disease in her brother.   ROS:   Please see the history of present illness.    ROS All other systems reviewed and are negative.   PHYSICAL EXAM:   VS:  There were no vitals taken for this visit.   GEN: Well nourished, well developed, in no acute distress  HEENT: normal  Neck: no JVD, carotid bruits, or masses Cardiac: ***RRR; no murmurs, rubs, or gallops,no edema  Respiratory:  clear to auscultation bilaterally, normal work of breathing GI: soft, nontender, nondistended, + BS MS: no deformity or atrophy  Skin: warm and dry, no rash Neuro:  Alert and Oriented x 3, Strength and sensation are intact Psych: euthymic mood, full affect  Wt Readings from Last 3 Encounters:  01/22/16 160 lb (72.6 kg)  01/10/16 160 lb (72.6 kg)  03/22/15 160 lb (72.6 kg)      Studies/Labs Reviewed:   EKG:  EKG is*** ordered today.  The ekg ordered today demonstrates ***  Recent Labs: 01/02/2016: B Natriuretic Peptide 583.1 01/10/2016: ALT 9; BUN 13; Creatinine, Ser 0.89; Hemoglobin 12.0; Platelets 255.0; Potassium 5.0; Sodium 140; TSH 1.78   Lipid Panel    Component Value Date/Time   CHOL 89 01/10/2016 1339   TRIG 81.0 01/10/2016 1339   HDL 28.90 (L) 01/10/2016 1339   CHOLHDL 3 01/10/2016 1339   VLDL 16.2 01/10/2016 1339   LDLCALC 44 01/10/2016 1339    Additional studies/ records that were reviewed today include:  ***    ASSESSMENT:    No diagnosis found.   PLAN:  In order of problems listed above:  1. ***    Medication Adjustments/Labs and Tests Ordered: Current medicines are  reviewed at length with the patient today.  Concerns regarding medicines are outlined above.  Medication changes, Labs and Tests ordered today are listed in the Patient Instructions below. There are no Patient Instructions on file for this visit.   Hilbert Corrigan, [place]  02/05/2016 6:36 AM    [facility], [place], [place]  [address] Phone: [phone]; Fax: [phone]

## 2016-02-06 ENCOUNTER — Encounter: Payer: Self-pay | Admitting: *Deleted

## 2016-02-06 ENCOUNTER — Ambulatory Visit: Payer: Medicare HMO | Admitting: Physician Assistant

## 2016-02-10 ENCOUNTER — Other Ambulatory Visit: Payer: Self-pay | Admitting: Internal Medicine

## 2016-02-25 NOTE — Progress Notes (Signed)
Cardiology Office Note    Date:  02/26/2016   ID:  Christina Pittman, DOB Jun 03, 1919, MRN [id number]  PCP:  Annye Asa, MD  Cardiologist: Dr. Debara Pickett  Chief Complaint  Patient presents with  . Follow-up    History of Present Illness:    Christina Pittman is a 83 y.o. female with past medical history of PAD (s/p R AKA), moderate to severe AS, PAF (on Eliquis), HTN, HLD, and prior CVA in 2013 who presents to the office today for follow-up.  Was last seen by Dr. Debara Pickett in 03/2015 and reported doing well from a cardiac standpoint, denying any recent chest discomfort or dyspnea with exertion. A repeat echocardiogram was ordered at that time to assess her AS but has not yet been obtained. She has since cancelled or no-showed for over 7 Cardiology appointments.   The patient does present today with her son and another female family member. She reports doing well from a cardiac perspective over the past year. She denies any recent chest discomfort, palpitations, dyspnea with exertion, orthopnea, or presyncope She does experience lower extremity edema and family report her Lasix was increased from when necessary to 20 mg twice a day.  Reports good compliance with her Eliquis 5mg  BID. She denies any evidence of active bleeding. No melena or hematochezia.  In regards to her AS, she had moderate to severe aortic stenosis by echocardiogram in 2013. They report being told at her last office visit she would "pass away within the next 6 months" and that is why they never followed up. By review of records this timeline was never mentioned, but it was recommended they discuss goals of care in the setting of her multiple medical conditions and likely severe AS which was very reasonable with the situation at hand.   She is not active at baseline. Mostly sits in a recliner throughout the day. Can stand and transfer to her wheelchair.   Past Medical History:  Diagnosis Date  . Aneurysm of thoracic aorta ([place])   .  Arthritis   . Aspiration pneumonia ([place])   . Bilateral venous insufficiency   . Chicken pox   . Diabetes mellitus   . Dysphagia   . Fractured pelvis ([place]) 05/14/2011  . Gangrene of foot ([place])   . Heart murmur   . Hemiparesis, right ([place])   . Hyperlipemia   . Hypertension   . Insomnia   . Pituitary macroadenoma ([place])   . Stroke [facility])    feb 2013    Past Surgical History:  Procedure Laterality Date  . ABDOMINAL HYSTERECTOMY    . ABOVE KNEE LEG AMPUTATION      Current Medications: Outpatient Medications Prior to Visit  Medication Sig Dispense Refill  . aspirin EC 81 MG tablet Take 81 mg by mouth daily.    Marland Kitchen doxycycline (VIBRAMYCIN) 100 MG capsule Take 1 capsule (100 mg total) by mouth 2 (two) times daily. 14 capsule 0  . NONFORMULARY OR COMPOUNDED ITEM Pt is in need of a Right AK Socket for her prosthetic. 1 each 0  . ondansetron (ZOFRAN ODT) 4 MG disintegrating tablet 4mg  ODT q4 hours prn nausea/vomit 10 tablet 0  . apixaban (ELIQUIS) 5 MG TABS tablet Take 1 tablet (5 mg total) by mouth 2 (two) times daily. 30 tablet 2  . carvedilol (COREG) 12.5 MG tablet TAKE ONE TABLET BY MOUTH TWICE DAILY 60 tablet 6  . furosemide (LASIX) 20 MG tablet TAKE ONE TABLET BY MOUTH ONCE DAILY AS NEEDED FOR  SWELLING 30 tablet 3  . potassium chloride SA (K-DUR,KLOR-CON) 20 MEQ tablet Take 1 tablet (20 mEq total) by mouth daily. 3 tablet 0  . simvastatin (ZOCOR) 20 MG tablet TAKE ONE TABLET BY MOUTH ONCE DAILY AT 6PM (Patient taking differently: TAKE 20 MG BY MOUTH ONCE DAILY AT 6PM) 30 tablet 6   No facility-administered medications prior to visit.      Allergies:   Betadine [povidone iodine]; Equalyte; Neosporin [neomycin-polymyxin-gramicidin]; and Codeine   Social History   Social History  . Marital status: Widowed    Spouse name: N/A  . Number of children: 3  . Years of education: 9th   Occupational History  . farmer    Social History Main Topics  . Smoking status: Never Smoker  .  Smokeless tobacco: Current User    Types: Snuff  . Alcohol use No  . Drug use: No  . Sexual activity: No   Other Topics Concern  . None   Social History Narrative   epworth sleepiness scale = 16 (03/27/2015)     Family History:  The patient's family history includes Arthritis in her mother; Cancer in her father; Clotting disorder in her sister; Diabetes in her brother and sister; Heart attack in her brother; Heart disease in her brother; Liver disease in her brother.   Review of Systems:   Please see the history of present illness.     General:  No chills, fever, night sweats or weight changes.  Cardiovascular:  No chest pain, dyspnea on exertion, orthopnea, palpitations, paroxysmal nocturnal dyspnea. Positive for lower extremity edema.  Dermatological: No rash, lesions/masses Respiratory: No cough, dyspnea Urologic: No hematuria, dysuria Abdominal:   No nausea, vomiting, diarrhea, bright red blood per rectum, melena, or hematemesis Neurologic:  No visual changes, wkns, changes in mental status. All other systems reviewed and are otherwise negative except as noted above.   Physical Exam:    VS:  BP 116/68   Pulse 66   Ht 5\' 6"  (1.676 m)    General: Frail, elderly Caucasian female appearing in no acute distress. Head: Normocephalic, atraumatic, sclera non-icteric, no xanthomas, nares are without discharge.  Neck: No carotid bruits. JVD not elevated.  Lungs: Respirations regular and unlabored, without wheezes or rales.  Heart: Irregularly, irregular. No S3 or S4.  No rubs, or gallops appreciated. 3/6 harsh SEM at RUSB. Abdomen: Soft, non-tender, non-distended with normoactive bowel sounds. No hepatomegaly. No rebound/guarding. No obvious abdominal masses. Msk:  Strength and tone appear normal for age. No joint deformities or effusions. Extremities: No clubbing or cyanosis. R AKA. 2+ pulses along LLE with trace lower extremity edema. Neuro: Alert and oriented X 3. Moves all  extremities spontaneously. No focal deficits noted. Psych:  Responds to questions appropriately with a normal affect. Skin: No rashes or lesions noted  Wt Readings from Last 3 Encounters:  01/22/16 160 lb (72.6 kg)  01/10/16 160 lb (72.6 kg)  03/22/15 160 lb (72.6 kg)     Studies/Labs Reviewed:   EKG:  EKG is ordered today.  The ekg ordered today demonstrates atrial fibrillation, HR 66, with known LBBB.   Recent Labs: 01/02/2016: B Natriuretic Peptide 583.1 01/10/2016: ALT 9; BUN 13; Creatinine, Ser 0.89; Hemoglobin 12.0; Platelets 255.0; Potassium 5.0; Sodium 140; TSH 1.78   Lipid Panel    Component Value Date/Time   CHOL 89 01/10/2016 1339   TRIG 81.0 01/10/2016 1339   HDL 28.90 (L) 01/10/2016 1339   CHOLHDL 3 01/10/2016 1339   VLDL 16.2  01/10/2016 1339   LDLCALC 44 01/10/2016 1339    Additional studies/ records that were reviewed today include:   Echocardiogram: 03/2011 Study Conclusions  - Left ventricle: Wall thickness was increased in a pattern of moderate LVH. Systolic function was vigorous. The estimated ejection fraction was in the range of 65% to 70%. There was an increased relative contribution of atrial contraction to ventricular filling. - Aortic valve: There was moderate to severe stenosis. Valve area: 0.85cm^2(VTI). Valve area: 0.78cm^2 (Vmax). - Mitral valve: Calcified annulus.  Assessment:    1. Aortic valve stenosis, etiology of cardiac valve disease unspecified   2. Persistent atrial fibrillation ([place])   3. Chronic diastolic congestive heart failure ([place])   4. Essential hypertension   5. Hyperlipidemia, unspecified hyperlipidemia type      Plan:   In order of problems listed above:  1. Aortic Stenosis - moderate to severe by echo in 2013. A repeat echocardiogram was recommended last year but never obtained. - she reports having lower extremity edema but thankfully denies any dyspnea with exertion, orthopnea, dizziness,  lightheadedness or presyncope. Is not active at baseline. - I am concerned the patient nor her family understand the severity and increased mortality associated with her valve disease, even though it was discussed in detail at her last office visit. We discussed this thoroughly, multiple times during the encounter. I reviewed we could obtain a repeat echocardiogram to know the exact severity of her condition, but that overall this would not change the course of treatment, as she would not be a candidate for surgical repair with her multiple medical issues and advanced age, nor does she or her family wish for her to undergo surgery. We discussed the utilization of symptom management and for now will continue with Lasix as she has been doing. They specifically asked me multiple times how long she could "survive" with this condition and I did not tell them a specific time frame, for with her advanced age this is something not easy to predict as it could happen very close in the future or further out. We did review AS statistics in that in the setting of severe AS and after onset of symptoms, survival rates can be as low as 50% at 2 years.   2. Persistent Atrial Fibrillation - This patients CHA2DS2-VASc Score and unadjusted Ischemic Stroke Rate (% per year) is equal to 9.7 % stroke rate/year from a score of 6 (HTN, Female, Age (2), CVA (2)). Continue Eliquis 5mg  BID for anticoagulation. Creatinine at 0.89 and weight > 60kg, therefore continue with current dosing. Denies any evidence of active bleeding.  - rate-controlled. Continue BB.   3. Chronic Diastolic CHF - EF [id number] by echo in 03/2011. - she does not appear acutely volume overloaded on physical exam. - continue Lasix 20mg  BID.   4. Essential HTN - BP well-controlled at 116/68 during today's visit. - continue Coreg 12.5mg  BID and Lasix 20mg  BID.  5. HLD - Lipid Panel in 12/2015 showed total cholesterol 89, HDL 28, and LDL 44. - continue Zocor.  Followed by PCP.   Medication Adjustments/Labs and Tests Ordered: Current medicines are reviewed at length with the patient today.  Concerns regarding medicines are outlined above.  Medication changes, Labs and Tests ordered today are listed in the Patient Instructions below. Patient Instructions  Medication Instructions:  Your physician recommends that you continue on your current medications as directed. Please refer to the Current Medication list given to you today.  If you need a  refill on your cardiac medications before your next appointment, please call your pharmacy.  Follow-Up: Your physician wants you to follow-up in: [place], PA-C ([place]) You will receive a reminder letter in the mail two months in advance. If you don't receive a letter, please call our office (IN April) to schedule the follow-up appointment.  Signed, Erma Heritage, PA  02/26/2016 5:20 PM    [facility] [place], [place], [place]  [address] Phone: [phone]; Fax: [phone]  [address] [place], [place] [address] Phone: [phone]

## 2016-02-26 ENCOUNTER — Telehealth: Payer: Self-pay | Admitting: Family Medicine

## 2016-02-26 ENCOUNTER — Ambulatory Visit (INDEPENDENT_AMBULATORY_CARE_PROVIDER_SITE_OTHER): Payer: Medicare HMO | Admitting: Student

## 2016-02-26 ENCOUNTER — Encounter: Payer: Self-pay | Admitting: Student

## 2016-02-26 VITALS — BP 116/68 | HR 66 | Ht 66.0 in

## 2016-02-26 DIAGNOSIS — I10 Essential (primary) hypertension: Secondary | ICD-10-CM | POA: Diagnosis not present

## 2016-02-26 DIAGNOSIS — E785 Hyperlipidemia, unspecified: Secondary | ICD-10-CM

## 2016-02-26 DIAGNOSIS — I5032 Chronic diastolic (congestive) heart failure: Secondary | ICD-10-CM

## 2016-02-26 DIAGNOSIS — I350 Nonrheumatic aortic (valve) stenosis: Secondary | ICD-10-CM | POA: Diagnosis not present

## 2016-02-26 DIAGNOSIS — I4819 Other persistent atrial fibrillation: Secondary | ICD-10-CM

## 2016-02-26 DIAGNOSIS — I481 Persistent atrial fibrillation: Secondary | ICD-10-CM | POA: Diagnosis not present

## 2016-02-26 MED ORDER — FUROSEMIDE 20 MG PO TABS: ORAL_TABLET | ORAL | 2 refills | Status: DC

## 2016-02-26 MED ORDER — POTASSIUM CHLORIDE CRYS ER 20 MEQ PO TBCR: 20.0000 meq | EXTENDED_RELEASE_TABLET | Freq: Every day | ORAL | 2 refills | Status: DC

## 2016-02-26 MED ORDER — APIXABAN 5 MG PO TABS: 5.0000 mg | ORAL_TABLET | Freq: Two times a day (BID) | ORAL | 2 refills | Status: DC

## 2016-02-26 MED ORDER — SIMVASTATIN 20 MG PO TABS: ORAL_TABLET | ORAL | 2 refills | Status: DC

## 2016-02-26 MED ORDER — CARVEDILOL 12.5 MG PO TABS: 12.5000 mg | ORAL_TABLET | Freq: Two times a day (BID) | ORAL | 2 refills | Status: DC

## 2016-02-26 NOTE — Telephone Encounter (Signed)
Called [facility] and they agreed with PCP notes. The only place that can supply the hoyer lift is [facility] and it would need a home health referral. Pt daughter made aware, they will discuss the referral with pt and let us know how to proceed.

## 2016-02-26 NOTE — Telephone Encounter (Signed)
If pt is in need of a Hoyer lift, they will need home health to come out and assess

## 2016-02-26 NOTE — Patient Instructions (Signed)
Medication Instructions:  Your physician recommends that you continue on your current medications as directed. Please refer to the Current Medication list given to you today.  If you need a refill on your cardiac medications before your next appointment, please call your pharmacy.  Follow-Up: Your physician wants you to follow-up in: [place], PA-C ([place]) You will receive a reminder letter in the mail two months in advance. If you don't receive a letter, please call our office (IN April) to schedule the follow-up appointment.  Thank you for choosing [facility] at [employer], LPN Bernerd Pho, PA-C

## 2016-02-26 NOTE — Telephone Encounter (Signed)
Patient's daughter Christina Pittman calling to see if they can get an rx for a holster lift/sling to use to help with lifting the patient.  She also needs to know where locally they can take the rx to get the holster.

## 2016-03-12 DIAGNOSIS — S91332A Puncture wound without foreign body, left foot, initial encounter: Secondary | ICD-10-CM | POA: Diagnosis not present

## 2016-03-12 DIAGNOSIS — L97522 Non-pressure chronic ulcer of other part of left foot with fat layer exposed: Secondary | ICD-10-CM | POA: Diagnosis not present

## 2016-03-12 DIAGNOSIS — T148XXA Other injury of unspecified body region, initial encounter: Secondary | ICD-10-CM | POA: Diagnosis not present

## 2016-03-13 DIAGNOSIS — T8789 Other complications of amputation stump: Secondary | ICD-10-CM | POA: Diagnosis not present

## 2016-03-13 DIAGNOSIS — I8310 Varicose veins of unspecified lower extremity with inflammation: Secondary | ICD-10-CM | POA: Diagnosis not present

## 2016-03-13 DIAGNOSIS — S91302A Unspecified open wound, left foot, initial encounter: Secondary | ICD-10-CM | POA: Diagnosis not present

## 2016-03-13 DIAGNOSIS — Z89611 Acquired absence of right leg above knee: Secondary | ICD-10-CM | POA: Diagnosis not present

## 2016-03-13 DIAGNOSIS — S81002A Unspecified open wound, left knee, initial encounter: Secondary | ICD-10-CM | POA: Diagnosis not present

## 2016-03-13 DIAGNOSIS — S41101A Unspecified open wound of right upper arm, initial encounter: Secondary | ICD-10-CM | POA: Diagnosis not present

## 2016-03-13 DIAGNOSIS — E1151 Type 2 diabetes mellitus with diabetic peripheral angiopathy without gangrene: Secondary | ICD-10-CM | POA: Diagnosis not present

## 2016-03-13 DIAGNOSIS — S70321A Blister (nonthermal), right thigh, initial encounter: Secondary | ICD-10-CM | POA: Diagnosis not present

## 2016-03-13 DIAGNOSIS — S81802A Unspecified open wound, left lower leg, initial encounter: Secondary | ICD-10-CM | POA: Diagnosis not present

## 2016-03-15 DIAGNOSIS — I10 Essential (primary) hypertension: Secondary | ICD-10-CM | POA: Diagnosis not present

## 2016-03-18 DIAGNOSIS — I10 Essential (primary) hypertension: Secondary | ICD-10-CM | POA: Diagnosis not present

## 2016-03-19 ENCOUNTER — Telehealth: Payer: Self-pay | Admitting: Family Medicine

## 2016-03-19 NOTE — Telephone Encounter (Signed)
Caller with [facility] asking for verbal orders to continue wound care for 3 x a wk for 3 wks then 2 x a wk for 2 wks, then 1 x a wk for 2 wks.

## 2016-03-19 NOTE — Telephone Encounter (Signed)
Verbal ok given.

## 2016-03-20 DIAGNOSIS — S51801D Unspecified open wound of right forearm, subsequent encounter: Secondary | ICD-10-CM | POA: Diagnosis not present

## 2016-03-20 DIAGNOSIS — R238 Other skin changes: Secondary | ICD-10-CM | POA: Diagnosis not present

## 2016-03-20 DIAGNOSIS — I739 Peripheral vascular disease, unspecified: Secondary | ICD-10-CM | POA: Diagnosis not present

## 2016-03-20 DIAGNOSIS — I872 Venous insufficiency (chronic) (peripheral): Secondary | ICD-10-CM | POA: Diagnosis not present

## 2016-03-22 DIAGNOSIS — I10 Essential (primary) hypertension: Secondary | ICD-10-CM | POA: Diagnosis not present

## 2016-03-25 DIAGNOSIS — I10 Essential (primary) hypertension: Secondary | ICD-10-CM | POA: Diagnosis not present

## 2016-03-27 DIAGNOSIS — I10 Essential (primary) hypertension: Secondary | ICD-10-CM | POA: Diagnosis not present

## 2016-03-29 DIAGNOSIS — I509 Heart failure, unspecified: Secondary | ICD-10-CM | POA: Diagnosis not present

## 2016-03-29 DIAGNOSIS — I872 Venous insufficiency (chronic) (peripheral): Secondary | ICD-10-CM | POA: Diagnosis not present

## 2016-03-29 DIAGNOSIS — I110 Hypertensive heart disease with heart failure: Secondary | ICD-10-CM | POA: Diagnosis not present

## 2016-03-29 DIAGNOSIS — L97221 Non-pressure chronic ulcer of left calf limited to breakdown of skin: Secondary | ICD-10-CM | POA: Diagnosis not present

## 2016-03-29 DIAGNOSIS — S70321D Blister (nonthermal), right thigh, subsequent encounter: Secondary | ICD-10-CM | POA: Diagnosis not present

## 2016-03-29 DIAGNOSIS — E1151 Type 2 diabetes mellitus with diabetic peripheral angiopathy without gangrene: Secondary | ICD-10-CM | POA: Diagnosis not present

## 2016-03-29 DIAGNOSIS — S81012D Laceration without foreign body, left knee, subsequent encounter: Secondary | ICD-10-CM | POA: Diagnosis not present

## 2016-03-29 DIAGNOSIS — L97522 Non-pressure chronic ulcer of other part of left foot with fat layer exposed: Secondary | ICD-10-CM | POA: Diagnosis not present

## 2016-03-29 DIAGNOSIS — S41111D Laceration without foreign body of right upper arm, subsequent encounter: Secondary | ICD-10-CM | POA: Diagnosis not present

## 2016-04-01 DIAGNOSIS — S41111D Laceration without foreign body of right upper arm, subsequent encounter: Secondary | ICD-10-CM | POA: Diagnosis not present

## 2016-04-01 DIAGNOSIS — S70321D Blister (nonthermal), right thigh, subsequent encounter: Secondary | ICD-10-CM | POA: Diagnosis not present

## 2016-04-01 DIAGNOSIS — I872 Venous insufficiency (chronic) (peripheral): Secondary | ICD-10-CM | POA: Diagnosis not present

## 2016-04-01 DIAGNOSIS — I110 Hypertensive heart disease with heart failure: Secondary | ICD-10-CM | POA: Diagnosis not present

## 2016-04-01 DIAGNOSIS — I509 Heart failure, unspecified: Secondary | ICD-10-CM | POA: Diagnosis not present

## 2016-04-01 DIAGNOSIS — S81012D Laceration without foreign body, left knee, subsequent encounter: Secondary | ICD-10-CM | POA: Diagnosis not present

## 2016-04-01 DIAGNOSIS — L97221 Non-pressure chronic ulcer of left calf limited to breakdown of skin: Secondary | ICD-10-CM | POA: Diagnosis not present

## 2016-04-01 DIAGNOSIS — E1151 Type 2 diabetes mellitus with diabetic peripheral angiopathy without gangrene: Secondary | ICD-10-CM | POA: Diagnosis not present

## 2016-04-01 DIAGNOSIS — L97522 Non-pressure chronic ulcer of other part of left foot with fat layer exposed: Secondary | ICD-10-CM | POA: Diagnosis not present

## 2016-04-03 DIAGNOSIS — I110 Hypertensive heart disease with heart failure: Secondary | ICD-10-CM | POA: Diagnosis not present

## 2016-04-03 DIAGNOSIS — S70321D Blister (nonthermal), right thigh, subsequent encounter: Secondary | ICD-10-CM | POA: Diagnosis not present

## 2016-04-03 DIAGNOSIS — S41111D Laceration without foreign body of right upper arm, subsequent encounter: Secondary | ICD-10-CM | POA: Diagnosis not present

## 2016-04-03 DIAGNOSIS — L97221 Non-pressure chronic ulcer of left calf limited to breakdown of skin: Secondary | ICD-10-CM | POA: Diagnosis not present

## 2016-04-03 DIAGNOSIS — S81012D Laceration without foreign body, left knee, subsequent encounter: Secondary | ICD-10-CM | POA: Diagnosis not present

## 2016-04-03 DIAGNOSIS — I872 Venous insufficiency (chronic) (peripheral): Secondary | ICD-10-CM | POA: Diagnosis not present

## 2016-04-03 DIAGNOSIS — I509 Heart failure, unspecified: Secondary | ICD-10-CM | POA: Diagnosis not present

## 2016-04-03 DIAGNOSIS — E1151 Type 2 diabetes mellitus with diabetic peripheral angiopathy without gangrene: Secondary | ICD-10-CM | POA: Diagnosis not present

## 2016-04-03 DIAGNOSIS — L97522 Non-pressure chronic ulcer of other part of left foot with fat layer exposed: Secondary | ICD-10-CM | POA: Diagnosis not present

## 2016-04-05 DIAGNOSIS — E1151 Type 2 diabetes mellitus with diabetic peripheral angiopathy without gangrene: Secondary | ICD-10-CM | POA: Diagnosis not present

## 2016-04-05 DIAGNOSIS — S41111D Laceration without foreign body of right upper arm, subsequent encounter: Secondary | ICD-10-CM | POA: Diagnosis not present

## 2016-04-05 DIAGNOSIS — I110 Hypertensive heart disease with heart failure: Secondary | ICD-10-CM | POA: Diagnosis not present

## 2016-04-05 DIAGNOSIS — L97221 Non-pressure chronic ulcer of left calf limited to breakdown of skin: Secondary | ICD-10-CM | POA: Diagnosis not present

## 2016-04-05 DIAGNOSIS — S70321D Blister (nonthermal), right thigh, subsequent encounter: Secondary | ICD-10-CM | POA: Diagnosis not present

## 2016-04-05 DIAGNOSIS — I872 Venous insufficiency (chronic) (peripheral): Secondary | ICD-10-CM | POA: Diagnosis not present

## 2016-04-05 DIAGNOSIS — L97522 Non-pressure chronic ulcer of other part of left foot with fat layer exposed: Secondary | ICD-10-CM | POA: Diagnosis not present

## 2016-04-05 DIAGNOSIS — I509 Heart failure, unspecified: Secondary | ICD-10-CM | POA: Diagnosis not present

## 2016-04-05 DIAGNOSIS — S81012D Laceration without foreign body, left knee, subsequent encounter: Secondary | ICD-10-CM | POA: Diagnosis not present

## 2016-04-08 DIAGNOSIS — L97522 Non-pressure chronic ulcer of other part of left foot with fat layer exposed: Secondary | ICD-10-CM | POA: Diagnosis not present

## 2016-04-08 DIAGNOSIS — S70321D Blister (nonthermal), right thigh, subsequent encounter: Secondary | ICD-10-CM | POA: Diagnosis not present

## 2016-04-08 DIAGNOSIS — S81012D Laceration without foreign body, left knee, subsequent encounter: Secondary | ICD-10-CM | POA: Diagnosis not present

## 2016-04-08 DIAGNOSIS — E1151 Type 2 diabetes mellitus with diabetic peripheral angiopathy without gangrene: Secondary | ICD-10-CM | POA: Diagnosis not present

## 2016-04-08 DIAGNOSIS — I509 Heart failure, unspecified: Secondary | ICD-10-CM | POA: Diagnosis not present

## 2016-04-08 DIAGNOSIS — L97221 Non-pressure chronic ulcer of left calf limited to breakdown of skin: Secondary | ICD-10-CM | POA: Diagnosis not present

## 2016-04-08 DIAGNOSIS — I110 Hypertensive heart disease with heart failure: Secondary | ICD-10-CM | POA: Diagnosis not present

## 2016-04-08 DIAGNOSIS — I872 Venous insufficiency (chronic) (peripheral): Secondary | ICD-10-CM | POA: Diagnosis not present

## 2016-04-08 DIAGNOSIS — S41111D Laceration without foreign body of right upper arm, subsequent encounter: Secondary | ICD-10-CM | POA: Diagnosis not present

## 2016-04-11 DIAGNOSIS — I110 Hypertensive heart disease with heart failure: Secondary | ICD-10-CM | POA: Diagnosis not present

## 2016-04-11 DIAGNOSIS — E1151 Type 2 diabetes mellitus with diabetic peripheral angiopathy without gangrene: Secondary | ICD-10-CM | POA: Diagnosis not present

## 2016-04-11 DIAGNOSIS — I509 Heart failure, unspecified: Secondary | ICD-10-CM | POA: Diagnosis not present

## 2016-04-11 DIAGNOSIS — S81012D Laceration without foreign body, left knee, subsequent encounter: Secondary | ICD-10-CM | POA: Diagnosis not present

## 2016-04-11 DIAGNOSIS — S41111D Laceration without foreign body of right upper arm, subsequent encounter: Secondary | ICD-10-CM | POA: Diagnosis not present

## 2016-04-11 DIAGNOSIS — I872 Venous insufficiency (chronic) (peripheral): Secondary | ICD-10-CM | POA: Diagnosis not present

## 2016-04-11 DIAGNOSIS — L97522 Non-pressure chronic ulcer of other part of left foot with fat layer exposed: Secondary | ICD-10-CM | POA: Diagnosis not present

## 2016-04-11 DIAGNOSIS — S70321D Blister (nonthermal), right thigh, subsequent encounter: Secondary | ICD-10-CM | POA: Diagnosis not present

## 2016-04-11 DIAGNOSIS — L97221 Non-pressure chronic ulcer of left calf limited to breakdown of skin: Secondary | ICD-10-CM | POA: Diagnosis not present

## 2016-04-15 DIAGNOSIS — S41111D Laceration without foreign body of right upper arm, subsequent encounter: Secondary | ICD-10-CM | POA: Diagnosis not present

## 2016-04-15 DIAGNOSIS — I872 Venous insufficiency (chronic) (peripheral): Secondary | ICD-10-CM | POA: Diagnosis not present

## 2016-04-15 DIAGNOSIS — S70321D Blister (nonthermal), right thigh, subsequent encounter: Secondary | ICD-10-CM | POA: Diagnosis not present

## 2016-04-15 DIAGNOSIS — E1151 Type 2 diabetes mellitus with diabetic peripheral angiopathy without gangrene: Secondary | ICD-10-CM | POA: Diagnosis not present

## 2016-04-15 DIAGNOSIS — I110 Hypertensive heart disease with heart failure: Secondary | ICD-10-CM | POA: Diagnosis not present

## 2016-04-15 DIAGNOSIS — S81012D Laceration without foreign body, left knee, subsequent encounter: Secondary | ICD-10-CM | POA: Diagnosis not present

## 2016-04-15 DIAGNOSIS — I509 Heart failure, unspecified: Secondary | ICD-10-CM | POA: Diagnosis not present

## 2016-04-15 DIAGNOSIS — L97221 Non-pressure chronic ulcer of left calf limited to breakdown of skin: Secondary | ICD-10-CM | POA: Diagnosis not present

## 2016-04-15 DIAGNOSIS — L97522 Non-pressure chronic ulcer of other part of left foot with fat layer exposed: Secondary | ICD-10-CM | POA: Diagnosis not present

## 2016-04-16 DIAGNOSIS — Z89611 Acquired absence of right leg above knee: Secondary | ICD-10-CM | POA: Diagnosis not present

## 2016-04-16 DIAGNOSIS — L97429 Non-pressure chronic ulcer of left heel and midfoot with unspecified severity: Secondary | ICD-10-CM | POA: Diagnosis not present

## 2016-04-16 DIAGNOSIS — E1151 Type 2 diabetes mellitus with diabetic peripheral angiopathy without gangrene: Secondary | ICD-10-CM | POA: Diagnosis not present

## 2016-04-16 DIAGNOSIS — L97421 Non-pressure chronic ulcer of left heel and midfoot limited to breakdown of skin: Secondary | ICD-10-CM | POA: Diagnosis not present

## 2016-04-16 DIAGNOSIS — E11621 Type 2 diabetes mellitus with foot ulcer: Secondary | ICD-10-CM | POA: Diagnosis not present

## 2016-04-16 DIAGNOSIS — S80822A Blister (nonthermal), left lower leg, initial encounter: Secondary | ICD-10-CM | POA: Diagnosis not present

## 2016-04-16 DIAGNOSIS — I872 Venous insufficiency (chronic) (peripheral): Secondary | ICD-10-CM | POA: Diagnosis not present

## 2016-04-18 DIAGNOSIS — S70321D Blister (nonthermal), right thigh, subsequent encounter: Secondary | ICD-10-CM | POA: Diagnosis not present

## 2016-04-18 DIAGNOSIS — E1151 Type 2 diabetes mellitus with diabetic peripheral angiopathy without gangrene: Secondary | ICD-10-CM | POA: Diagnosis not present

## 2016-04-18 DIAGNOSIS — L97522 Non-pressure chronic ulcer of other part of left foot with fat layer exposed: Secondary | ICD-10-CM | POA: Diagnosis not present

## 2016-04-18 DIAGNOSIS — L97221 Non-pressure chronic ulcer of left calf limited to breakdown of skin: Secondary | ICD-10-CM | POA: Diagnosis not present

## 2016-04-18 DIAGNOSIS — S81012D Laceration without foreign body, left knee, subsequent encounter: Secondary | ICD-10-CM | POA: Diagnosis not present

## 2016-04-18 DIAGNOSIS — I872 Venous insufficiency (chronic) (peripheral): Secondary | ICD-10-CM | POA: Diagnosis not present

## 2016-04-18 DIAGNOSIS — I110 Hypertensive heart disease with heart failure: Secondary | ICD-10-CM | POA: Diagnosis not present

## 2016-04-18 DIAGNOSIS — S41111D Laceration without foreign body of right upper arm, subsequent encounter: Secondary | ICD-10-CM | POA: Diagnosis not present

## 2016-04-18 DIAGNOSIS — I509 Heart failure, unspecified: Secondary | ICD-10-CM | POA: Diagnosis not present

## 2016-04-23 DIAGNOSIS — S70321D Blister (nonthermal), right thigh, subsequent encounter: Secondary | ICD-10-CM | POA: Diagnosis not present

## 2016-04-23 DIAGNOSIS — I872 Venous insufficiency (chronic) (peripheral): Secondary | ICD-10-CM | POA: Diagnosis not present

## 2016-04-23 DIAGNOSIS — I509 Heart failure, unspecified: Secondary | ICD-10-CM | POA: Diagnosis not present

## 2016-04-23 DIAGNOSIS — L97221 Non-pressure chronic ulcer of left calf limited to breakdown of skin: Secondary | ICD-10-CM | POA: Diagnosis not present

## 2016-04-23 DIAGNOSIS — I110 Hypertensive heart disease with heart failure: Secondary | ICD-10-CM | POA: Diagnosis not present

## 2016-04-23 DIAGNOSIS — S81012D Laceration without foreign body, left knee, subsequent encounter: Secondary | ICD-10-CM | POA: Diagnosis not present

## 2016-04-23 DIAGNOSIS — S41111D Laceration without foreign body of right upper arm, subsequent encounter: Secondary | ICD-10-CM | POA: Diagnosis not present

## 2016-04-23 DIAGNOSIS — L97522 Non-pressure chronic ulcer of other part of left foot with fat layer exposed: Secondary | ICD-10-CM | POA: Diagnosis not present

## 2016-04-23 DIAGNOSIS — E1151 Type 2 diabetes mellitus with diabetic peripheral angiopathy without gangrene: Secondary | ICD-10-CM | POA: Diagnosis not present

## 2016-04-26 DIAGNOSIS — I872 Venous insufficiency (chronic) (peripheral): Secondary | ICD-10-CM | POA: Diagnosis not present

## 2016-04-26 DIAGNOSIS — I509 Heart failure, unspecified: Secondary | ICD-10-CM | POA: Diagnosis not present

## 2016-04-26 DIAGNOSIS — L97522 Non-pressure chronic ulcer of other part of left foot with fat layer exposed: Secondary | ICD-10-CM | POA: Diagnosis not present

## 2016-04-26 DIAGNOSIS — S70321D Blister (nonthermal), right thigh, subsequent encounter: Secondary | ICD-10-CM | POA: Diagnosis not present

## 2016-04-26 DIAGNOSIS — I110 Hypertensive heart disease with heart failure: Secondary | ICD-10-CM | POA: Diagnosis not present

## 2016-04-26 DIAGNOSIS — E1151 Type 2 diabetes mellitus with diabetic peripheral angiopathy without gangrene: Secondary | ICD-10-CM | POA: Diagnosis not present

## 2016-04-26 DIAGNOSIS — S81012D Laceration without foreign body, left knee, subsequent encounter: Secondary | ICD-10-CM | POA: Diagnosis not present

## 2016-04-26 DIAGNOSIS — L97221 Non-pressure chronic ulcer of left calf limited to breakdown of skin: Secondary | ICD-10-CM | POA: Diagnosis not present

## 2016-04-26 DIAGNOSIS — S41111D Laceration without foreign body of right upper arm, subsequent encounter: Secondary | ICD-10-CM | POA: Diagnosis not present

## 2016-04-29 DIAGNOSIS — I110 Hypertensive heart disease with heart failure: Secondary | ICD-10-CM | POA: Diagnosis not present

## 2016-04-29 DIAGNOSIS — E1151 Type 2 diabetes mellitus with diabetic peripheral angiopathy without gangrene: Secondary | ICD-10-CM | POA: Diagnosis not present

## 2016-04-29 DIAGNOSIS — I872 Venous insufficiency (chronic) (peripheral): Secondary | ICD-10-CM | POA: Diagnosis not present

## 2016-04-29 DIAGNOSIS — L97522 Non-pressure chronic ulcer of other part of left foot with fat layer exposed: Secondary | ICD-10-CM | POA: Diagnosis not present

## 2016-04-29 DIAGNOSIS — L97221 Non-pressure chronic ulcer of left calf limited to breakdown of skin: Secondary | ICD-10-CM | POA: Diagnosis not present

## 2016-04-29 DIAGNOSIS — I509 Heart failure, unspecified: Secondary | ICD-10-CM | POA: Diagnosis not present

## 2016-04-29 DIAGNOSIS — S81012D Laceration without foreign body, left knee, subsequent encounter: Secondary | ICD-10-CM | POA: Diagnosis not present

## 2016-04-29 DIAGNOSIS — S41111D Laceration without foreign body of right upper arm, subsequent encounter: Secondary | ICD-10-CM | POA: Diagnosis not present

## 2016-04-29 DIAGNOSIS — S70321D Blister (nonthermal), right thigh, subsequent encounter: Secondary | ICD-10-CM | POA: Diagnosis not present

## 2016-05-02 DIAGNOSIS — S70321D Blister (nonthermal), right thigh, subsequent encounter: Secondary | ICD-10-CM | POA: Diagnosis not present

## 2016-05-02 DIAGNOSIS — L97522 Non-pressure chronic ulcer of other part of left foot with fat layer exposed: Secondary | ICD-10-CM | POA: Diagnosis not present

## 2016-05-02 DIAGNOSIS — I872 Venous insufficiency (chronic) (peripheral): Secondary | ICD-10-CM | POA: Diagnosis not present

## 2016-05-02 DIAGNOSIS — S81012D Laceration without foreign body, left knee, subsequent encounter: Secondary | ICD-10-CM | POA: Diagnosis not present

## 2016-05-02 DIAGNOSIS — I509 Heart failure, unspecified: Secondary | ICD-10-CM | POA: Diagnosis not present

## 2016-05-02 DIAGNOSIS — S41111D Laceration without foreign body of right upper arm, subsequent encounter: Secondary | ICD-10-CM | POA: Diagnosis not present

## 2016-05-02 DIAGNOSIS — L97221 Non-pressure chronic ulcer of left calf limited to breakdown of skin: Secondary | ICD-10-CM | POA: Diagnosis not present

## 2016-05-02 DIAGNOSIS — E1151 Type 2 diabetes mellitus with diabetic peripheral angiopathy without gangrene: Secondary | ICD-10-CM | POA: Diagnosis not present

## 2016-05-02 DIAGNOSIS — I110 Hypertensive heart disease with heart failure: Secondary | ICD-10-CM | POA: Diagnosis not present

## 2016-05-07 DIAGNOSIS — E1151 Type 2 diabetes mellitus with diabetic peripheral angiopathy without gangrene: Secondary | ICD-10-CM | POA: Diagnosis not present

## 2016-05-07 DIAGNOSIS — S70321D Blister (nonthermal), right thigh, subsequent encounter: Secondary | ICD-10-CM | POA: Diagnosis not present

## 2016-05-07 DIAGNOSIS — L97522 Non-pressure chronic ulcer of other part of left foot with fat layer exposed: Secondary | ICD-10-CM | POA: Diagnosis not present

## 2016-05-07 DIAGNOSIS — I509 Heart failure, unspecified: Secondary | ICD-10-CM | POA: Diagnosis not present

## 2016-05-07 DIAGNOSIS — I872 Venous insufficiency (chronic) (peripheral): Secondary | ICD-10-CM | POA: Diagnosis not present

## 2016-05-07 DIAGNOSIS — I110 Hypertensive heart disease with heart failure: Secondary | ICD-10-CM | POA: Diagnosis not present

## 2016-05-07 DIAGNOSIS — S41111D Laceration without foreign body of right upper arm, subsequent encounter: Secondary | ICD-10-CM | POA: Diagnosis not present

## 2016-05-07 DIAGNOSIS — S81012D Laceration without foreign body, left knee, subsequent encounter: Secondary | ICD-10-CM | POA: Diagnosis not present

## 2016-05-07 DIAGNOSIS — L97221 Non-pressure chronic ulcer of left calf limited to breakdown of skin: Secondary | ICD-10-CM | POA: Diagnosis not present

## 2016-05-10 DIAGNOSIS — L97522 Non-pressure chronic ulcer of other part of left foot with fat layer exposed: Secondary | ICD-10-CM | POA: Diagnosis not present

## 2016-05-10 DIAGNOSIS — I110 Hypertensive heart disease with heart failure: Secondary | ICD-10-CM | POA: Diagnosis not present

## 2016-05-10 DIAGNOSIS — I872 Venous insufficiency (chronic) (peripheral): Secondary | ICD-10-CM | POA: Diagnosis not present

## 2016-05-10 DIAGNOSIS — E1151 Type 2 diabetes mellitus with diabetic peripheral angiopathy without gangrene: Secondary | ICD-10-CM | POA: Diagnosis not present

## 2016-05-10 DIAGNOSIS — S41111D Laceration without foreign body of right upper arm, subsequent encounter: Secondary | ICD-10-CM | POA: Diagnosis not present

## 2016-05-10 DIAGNOSIS — I509 Heart failure, unspecified: Secondary | ICD-10-CM | POA: Diagnosis not present

## 2016-05-10 DIAGNOSIS — S81012D Laceration without foreign body, left knee, subsequent encounter: Secondary | ICD-10-CM | POA: Diagnosis not present

## 2016-05-10 DIAGNOSIS — L97221 Non-pressure chronic ulcer of left calf limited to breakdown of skin: Secondary | ICD-10-CM | POA: Diagnosis not present

## 2016-05-10 DIAGNOSIS — S70321D Blister (nonthermal), right thigh, subsequent encounter: Secondary | ICD-10-CM | POA: Diagnosis not present

## 2016-05-14 DIAGNOSIS — I739 Peripheral vascular disease, unspecified: Secondary | ICD-10-CM | POA: Diagnosis not present

## 2016-05-14 DIAGNOSIS — I872 Venous insufficiency (chronic) (peripheral): Secondary | ICD-10-CM | POA: Diagnosis not present

## 2016-05-14 DIAGNOSIS — E1159 Type 2 diabetes mellitus with other circulatory complications: Secondary | ICD-10-CM | POA: Diagnosis not present

## 2016-05-14 DIAGNOSIS — L97529 Non-pressure chronic ulcer of other part of left foot with unspecified severity: Secondary | ICD-10-CM | POA: Diagnosis not present

## 2016-05-17 DIAGNOSIS — I509 Heart failure, unspecified: Secondary | ICD-10-CM | POA: Diagnosis not present

## 2016-05-17 DIAGNOSIS — I872 Venous insufficiency (chronic) (peripheral): Secondary | ICD-10-CM | POA: Diagnosis not present

## 2016-05-17 DIAGNOSIS — I10 Essential (primary) hypertension: Secondary | ICD-10-CM | POA: Diagnosis not present

## 2016-05-17 DIAGNOSIS — E1151 Type 2 diabetes mellitus with diabetic peripheral angiopathy without gangrene: Secondary | ICD-10-CM | POA: Diagnosis not present

## 2016-05-17 DIAGNOSIS — L97522 Non-pressure chronic ulcer of other part of left foot with fat layer exposed: Secondary | ICD-10-CM | POA: Diagnosis not present

## 2016-05-20 DIAGNOSIS — I10 Essential (primary) hypertension: Secondary | ICD-10-CM | POA: Diagnosis not present

## 2016-05-20 DIAGNOSIS — L97522 Non-pressure chronic ulcer of other part of left foot with fat layer exposed: Secondary | ICD-10-CM | POA: Diagnosis not present

## 2016-05-20 DIAGNOSIS — E1151 Type 2 diabetes mellitus with diabetic peripheral angiopathy without gangrene: Secondary | ICD-10-CM | POA: Diagnosis not present

## 2016-05-20 DIAGNOSIS — I509 Heart failure, unspecified: Secondary | ICD-10-CM | POA: Diagnosis not present

## 2016-05-20 DIAGNOSIS — I872 Venous insufficiency (chronic) (peripheral): Secondary | ICD-10-CM | POA: Diagnosis not present

## 2016-05-23 DIAGNOSIS — I872 Venous insufficiency (chronic) (peripheral): Secondary | ICD-10-CM | POA: Diagnosis not present

## 2016-05-23 DIAGNOSIS — I509 Heart failure, unspecified: Secondary | ICD-10-CM | POA: Diagnosis not present

## 2016-05-23 DIAGNOSIS — I10 Essential (primary) hypertension: Secondary | ICD-10-CM | POA: Diagnosis not present

## 2016-05-23 DIAGNOSIS — E1151 Type 2 diabetes mellitus with diabetic peripheral angiopathy without gangrene: Secondary | ICD-10-CM | POA: Diagnosis not present

## 2016-05-23 DIAGNOSIS — L97522 Non-pressure chronic ulcer of other part of left foot with fat layer exposed: Secondary | ICD-10-CM | POA: Diagnosis not present

## 2016-05-27 DIAGNOSIS — L97522 Non-pressure chronic ulcer of other part of left foot with fat layer exposed: Secondary | ICD-10-CM | POA: Diagnosis not present

## 2016-05-27 DIAGNOSIS — I509 Heart failure, unspecified: Secondary | ICD-10-CM | POA: Diagnosis not present

## 2016-05-27 DIAGNOSIS — I10 Essential (primary) hypertension: Secondary | ICD-10-CM | POA: Diagnosis not present

## 2016-05-27 DIAGNOSIS — E1151 Type 2 diabetes mellitus with diabetic peripheral angiopathy without gangrene: Secondary | ICD-10-CM | POA: Diagnosis not present

## 2016-05-27 DIAGNOSIS — I872 Venous insufficiency (chronic) (peripheral): Secondary | ICD-10-CM | POA: Diagnosis not present

## 2016-05-30 DIAGNOSIS — I110 Hypertensive heart disease with heart failure: Secondary | ICD-10-CM | POA: Diagnosis not present

## 2016-05-30 DIAGNOSIS — Z9181 History of falling: Secondary | ICD-10-CM | POA: Diagnosis not present

## 2016-05-30 DIAGNOSIS — E1151 Type 2 diabetes mellitus with diabetic peripheral angiopathy without gangrene: Secondary | ICD-10-CM | POA: Diagnosis not present

## 2016-05-30 DIAGNOSIS — L97529 Non-pressure chronic ulcer of other part of left foot with unspecified severity: Secondary | ICD-10-CM | POA: Diagnosis not present

## 2016-05-30 DIAGNOSIS — Z4800 Encounter for change or removal of nonsurgical wound dressing: Secondary | ICD-10-CM | POA: Diagnosis not present

## 2016-05-30 DIAGNOSIS — I872 Venous insufficiency (chronic) (peripheral): Secondary | ICD-10-CM | POA: Diagnosis not present

## 2016-05-30 DIAGNOSIS — Z993 Dependence on wheelchair: Secondary | ICD-10-CM | POA: Diagnosis not present

## 2016-05-30 DIAGNOSIS — Z89511 Acquired absence of right leg below knee: Secondary | ICD-10-CM | POA: Diagnosis not present

## 2016-05-30 DIAGNOSIS — I509 Heart failure, unspecified: Secondary | ICD-10-CM | POA: Diagnosis not present

## 2016-06-07 DIAGNOSIS — L97529 Non-pressure chronic ulcer of other part of left foot with unspecified severity: Secondary | ICD-10-CM | POA: Diagnosis not present

## 2016-06-07 DIAGNOSIS — I872 Venous insufficiency (chronic) (peripheral): Secondary | ICD-10-CM | POA: Diagnosis not present

## 2016-06-07 DIAGNOSIS — Z9181 History of falling: Secondary | ICD-10-CM | POA: Diagnosis not present

## 2016-06-07 DIAGNOSIS — I110 Hypertensive heart disease with heart failure: Secondary | ICD-10-CM | POA: Diagnosis not present

## 2016-06-07 DIAGNOSIS — Z993 Dependence on wheelchair: Secondary | ICD-10-CM | POA: Diagnosis not present

## 2016-06-07 DIAGNOSIS — Z89511 Acquired absence of right leg below knee: Secondary | ICD-10-CM | POA: Diagnosis not present

## 2016-06-07 DIAGNOSIS — Z4800 Encounter for change or removal of nonsurgical wound dressing: Secondary | ICD-10-CM | POA: Diagnosis not present

## 2016-06-07 DIAGNOSIS — E1151 Type 2 diabetes mellitus with diabetic peripheral angiopathy without gangrene: Secondary | ICD-10-CM | POA: Diagnosis not present

## 2016-06-07 DIAGNOSIS — I509 Heart failure, unspecified: Secondary | ICD-10-CM | POA: Diagnosis not present

## 2016-06-10 DIAGNOSIS — L97529 Non-pressure chronic ulcer of other part of left foot with unspecified severity: Secondary | ICD-10-CM | POA: Diagnosis not present

## 2016-06-10 DIAGNOSIS — Z89511 Acquired absence of right leg below knee: Secondary | ICD-10-CM | POA: Diagnosis not present

## 2016-06-10 DIAGNOSIS — Z993 Dependence on wheelchair: Secondary | ICD-10-CM | POA: Diagnosis not present

## 2016-06-10 DIAGNOSIS — Z4800 Encounter for change or removal of nonsurgical wound dressing: Secondary | ICD-10-CM | POA: Diagnosis not present

## 2016-06-10 DIAGNOSIS — E1151 Type 2 diabetes mellitus with diabetic peripheral angiopathy without gangrene: Secondary | ICD-10-CM | POA: Diagnosis not present

## 2016-06-10 DIAGNOSIS — I110 Hypertensive heart disease with heart failure: Secondary | ICD-10-CM | POA: Diagnosis not present

## 2016-06-10 DIAGNOSIS — Z9181 History of falling: Secondary | ICD-10-CM | POA: Diagnosis not present

## 2016-06-10 DIAGNOSIS — I872 Venous insufficiency (chronic) (peripheral): Secondary | ICD-10-CM | POA: Diagnosis not present

## 2016-06-10 DIAGNOSIS — I509 Heart failure, unspecified: Secondary | ICD-10-CM | POA: Diagnosis not present

## 2016-06-11 DIAGNOSIS — Z993 Dependence on wheelchair: Secondary | ICD-10-CM | POA: Diagnosis not present

## 2016-06-11 DIAGNOSIS — L97529 Non-pressure chronic ulcer of other part of left foot with unspecified severity: Secondary | ICD-10-CM | POA: Diagnosis not present

## 2016-06-11 DIAGNOSIS — I872 Venous insufficiency (chronic) (peripheral): Secondary | ICD-10-CM | POA: Diagnosis not present

## 2016-06-11 DIAGNOSIS — I110 Hypertensive heart disease with heart failure: Secondary | ICD-10-CM | POA: Diagnosis not present

## 2016-06-11 DIAGNOSIS — Z4800 Encounter for change or removal of nonsurgical wound dressing: Secondary | ICD-10-CM | POA: Diagnosis not present

## 2016-06-11 DIAGNOSIS — I509 Heart failure, unspecified: Secondary | ICD-10-CM | POA: Diagnosis not present

## 2016-06-11 DIAGNOSIS — E1151 Type 2 diabetes mellitus with diabetic peripheral angiopathy without gangrene: Secondary | ICD-10-CM | POA: Diagnosis not present

## 2016-06-11 DIAGNOSIS — Z89511 Acquired absence of right leg below knee: Secondary | ICD-10-CM | POA: Diagnosis not present

## 2016-06-11 DIAGNOSIS — Z9181 History of falling: Secondary | ICD-10-CM | POA: Diagnosis not present

## 2016-06-12 ENCOUNTER — Telehealth: Payer: Self-pay | Admitting: Family Medicine

## 2016-06-12 NOTE — Telephone Encounter (Signed)
Verbal ok given.

## 2016-06-12 NOTE — Telephone Encounter (Signed)
Caller states that she is with [facility] asking for verbal orders for pt to have PT 2 x a wk for 3 wks, and then 1 x a wk for 1 wk.

## 2016-06-13 ENCOUNTER — Other Ambulatory Visit (HOSPITAL_COMMUNITY)
Admission: RE | Admit: 2016-06-13 | Discharge: 2016-06-13 | Disposition: A | Discharge status: Home / Self Care | Payer: Medicare HMO | Source: Other Acute Inpatient Hospital | Attending: Family Medicine | Admitting: Family Medicine

## 2016-06-13 ENCOUNTER — Telehealth: Payer: Self-pay | Admitting: Family Medicine

## 2016-06-13 DIAGNOSIS — I509 Heart failure, unspecified: Secondary | ICD-10-CM | POA: Diagnosis not present

## 2016-06-13 DIAGNOSIS — Z9181 History of falling: Secondary | ICD-10-CM | POA: Diagnosis not present

## 2016-06-13 DIAGNOSIS — E1151 Type 2 diabetes mellitus with diabetic peripheral angiopathy without gangrene: Secondary | ICD-10-CM | POA: Diagnosis not present

## 2016-06-13 DIAGNOSIS — I872 Venous insufficiency (chronic) (peripheral): Secondary | ICD-10-CM | POA: Diagnosis not present

## 2016-06-13 DIAGNOSIS — Z4800 Encounter for change or removal of nonsurgical wound dressing: Secondary | ICD-10-CM | POA: Diagnosis not present

## 2016-06-13 DIAGNOSIS — N390 Urinary tract infection, site not specified: Secondary | ICD-10-CM | POA: Insufficient documentation

## 2016-06-13 DIAGNOSIS — L97529 Non-pressure chronic ulcer of other part of left foot with unspecified severity: Secondary | ICD-10-CM | POA: Diagnosis not present

## 2016-06-13 DIAGNOSIS — I110 Hypertensive heart disease with heart failure: Secondary | ICD-10-CM | POA: Diagnosis not present

## 2016-06-13 DIAGNOSIS — Z993 Dependence on wheelchair: Secondary | ICD-10-CM | POA: Diagnosis not present

## 2016-06-13 DIAGNOSIS — Z89511 Acquired absence of right leg below knee: Secondary | ICD-10-CM | POA: Diagnosis not present

## 2016-06-13 LAB — URINALYSIS, ROUTINE W REFLEX MICROSCOPIC
BILIRUBIN URINE: NEGATIVE
Glucose, UA: NEGATIVE mg/dL
Ketones, ur: NEGATIVE mg/dL
Nitrite: POSITIVE — AB
PH: 6 (ref 5.0–8.0)
Protein, ur: NEGATIVE mg/dL
Specific Gravity, Urine: 1.009 (ref 1.005–1.030)

## 2016-06-13 NOTE — Telephone Encounter (Signed)
Ok for orders? 

## 2016-06-13 NOTE — Telephone Encounter (Signed)
Verbal ok given.

## 2016-06-13 NOTE — Telephone Encounter (Signed)
Elmyra Ricks with [facility] calling to request verbal order for u/a.  Thinks patient may have uti.  Please return call to [place] at [phone].

## 2016-06-13 NOTE — Telephone Encounter (Signed)
Ok for UA w/ culture and sensitivities

## 2016-06-14 ENCOUNTER — Telehealth: Payer: Self-pay | Admitting: Family Medicine

## 2016-06-14 ENCOUNTER — Other Ambulatory Visit: Payer: Self-pay | Admitting: General Practice

## 2016-06-14 MED ORDER — CEPHALEXIN 500 MG PO CAPS: 500.0000 mg | ORAL_CAPSULE | Freq: Two times a day (BID) | ORAL | 0 refills | Status: AC

## 2016-06-14 NOTE — Telephone Encounter (Signed)
Noted  

## 2016-06-14 NOTE — Telephone Encounter (Signed)
Caller with [facility] states that pt missed visit with them and will add this on at the end of the session and wanted to make you aware.

## 2016-06-16 LAB — URINE CULTURE: Culture: >=100000 — AB

## 2016-06-17 DIAGNOSIS — E1151 Type 2 diabetes mellitus with diabetic peripheral angiopathy without gangrene: Secondary | ICD-10-CM | POA: Diagnosis not present

## 2016-06-17 DIAGNOSIS — Z9181 History of falling: Secondary | ICD-10-CM | POA: Diagnosis not present

## 2016-06-17 DIAGNOSIS — I110 Hypertensive heart disease with heart failure: Secondary | ICD-10-CM | POA: Diagnosis not present

## 2016-06-17 DIAGNOSIS — I872 Venous insufficiency (chronic) (peripheral): Secondary | ICD-10-CM | POA: Diagnosis not present

## 2016-06-17 DIAGNOSIS — Z89511 Acquired absence of right leg below knee: Secondary | ICD-10-CM | POA: Diagnosis not present

## 2016-06-17 DIAGNOSIS — I509 Heart failure, unspecified: Secondary | ICD-10-CM | POA: Diagnosis not present

## 2016-06-17 DIAGNOSIS — L97529 Non-pressure chronic ulcer of other part of left foot with unspecified severity: Secondary | ICD-10-CM | POA: Diagnosis not present

## 2016-06-17 DIAGNOSIS — Z993 Dependence on wheelchair: Secondary | ICD-10-CM | POA: Diagnosis not present

## 2016-06-17 DIAGNOSIS — Z4800 Encounter for change or removal of nonsurgical wound dressing: Secondary | ICD-10-CM | POA: Diagnosis not present

## 2016-06-18 DIAGNOSIS — E1151 Type 2 diabetes mellitus with diabetic peripheral angiopathy without gangrene: Secondary | ICD-10-CM | POA: Diagnosis not present

## 2016-06-18 DIAGNOSIS — Z993 Dependence on wheelchair: Secondary | ICD-10-CM | POA: Diagnosis not present

## 2016-06-18 DIAGNOSIS — I872 Venous insufficiency (chronic) (peripheral): Secondary | ICD-10-CM | POA: Diagnosis not present

## 2016-06-18 DIAGNOSIS — Z9181 History of falling: Secondary | ICD-10-CM | POA: Diagnosis not present

## 2016-06-18 DIAGNOSIS — Z4800 Encounter for change or removal of nonsurgical wound dressing: Secondary | ICD-10-CM | POA: Diagnosis not present

## 2016-06-18 DIAGNOSIS — I110 Hypertensive heart disease with heart failure: Secondary | ICD-10-CM | POA: Diagnosis not present

## 2016-06-18 DIAGNOSIS — Z89511 Acquired absence of right leg below knee: Secondary | ICD-10-CM | POA: Diagnosis not present

## 2016-06-18 DIAGNOSIS — I509 Heart failure, unspecified: Secondary | ICD-10-CM | POA: Diagnosis not present

## 2016-06-18 DIAGNOSIS — L97529 Non-pressure chronic ulcer of other part of left foot with unspecified severity: Secondary | ICD-10-CM | POA: Diagnosis not present

## 2016-06-20 ENCOUNTER — Telehealth: Payer: Self-pay | Admitting: Family Medicine

## 2016-06-20 DIAGNOSIS — I509 Heart failure, unspecified: Secondary | ICD-10-CM | POA: Diagnosis not present

## 2016-06-20 DIAGNOSIS — L97529 Non-pressure chronic ulcer of other part of left foot with unspecified severity: Secondary | ICD-10-CM | POA: Diagnosis not present

## 2016-06-20 DIAGNOSIS — I110 Hypertensive heart disease with heart failure: Secondary | ICD-10-CM | POA: Diagnosis not present

## 2016-06-20 DIAGNOSIS — E1151 Type 2 diabetes mellitus with diabetic peripheral angiopathy without gangrene: Secondary | ICD-10-CM | POA: Diagnosis not present

## 2016-06-20 DIAGNOSIS — Z89511 Acquired absence of right leg below knee: Secondary | ICD-10-CM | POA: Diagnosis not present

## 2016-06-20 DIAGNOSIS — Z4800 Encounter for change or removal of nonsurgical wound dressing: Secondary | ICD-10-CM | POA: Diagnosis not present

## 2016-06-20 DIAGNOSIS — Z9181 History of falling: Secondary | ICD-10-CM | POA: Diagnosis not present

## 2016-06-20 DIAGNOSIS — I872 Venous insufficiency (chronic) (peripheral): Secondary | ICD-10-CM | POA: Diagnosis not present

## 2016-06-20 DIAGNOSIS — Z993 Dependence on wheelchair: Secondary | ICD-10-CM | POA: Diagnosis not present

## 2016-06-20 NOTE — Telephone Encounter (Signed)
Patient's daughter states [facility] no longer stocks furosemide (LASIX) 20 MG tablet.  She wants to know if patient can be switched the medication she was on prior to be switched to Lasix.  Pharmacy:  [facility], [place] [phone] (Phone) [phone] (Fax)

## 2016-06-20 NOTE — Telephone Encounter (Signed)
Caller with [facility] asking if KT would like her to do a UA on pt after she completes abx in 7 days from her uti.

## 2016-06-20 NOTE — Telephone Encounter (Signed)
Bree with [facility] returning call to state they do stock Lasix 20mg .  Please call her back at [phone] with any additional questions.

## 2016-06-20 NOTE — Telephone Encounter (Signed)
Please check on this.  This can't be right

## 2016-06-20 NOTE — Telephone Encounter (Signed)
Please advise 

## 2016-06-20 NOTE — Telephone Encounter (Signed)
Called and gave PCP recommendation.

## 2016-06-20 NOTE — Telephone Encounter (Signed)
Called pharmacy, confirmed they have Lasix 20mg  in stock. Advised patients daughter, Baker Janus.

## 2016-06-20 NOTE — Telephone Encounter (Signed)
No- no test of cure needed

## 2016-06-21 MED ORDER — FUROSEMIDE 20 MG PO TABS: ORAL_TABLET | ORAL | 0 refills | Status: DC

## 2016-06-21 NOTE — Telephone Encounter (Signed)
Pt daughter called this morning stating that [facility] did not have this and asking that we [facility].

## 2016-06-21 NOTE — Telephone Encounter (Signed)
[facility] today.

## 2016-06-26 DIAGNOSIS — Z89511 Acquired absence of right leg below knee: Secondary | ICD-10-CM | POA: Diagnosis not present

## 2016-06-26 DIAGNOSIS — I110 Hypertensive heart disease with heart failure: Secondary | ICD-10-CM | POA: Diagnosis not present

## 2016-06-26 DIAGNOSIS — L97529 Non-pressure chronic ulcer of other part of left foot with unspecified severity: Secondary | ICD-10-CM | POA: Diagnosis not present

## 2016-06-26 DIAGNOSIS — Z9181 History of falling: Secondary | ICD-10-CM | POA: Diagnosis not present

## 2016-06-26 DIAGNOSIS — Z4800 Encounter for change or removal of nonsurgical wound dressing: Secondary | ICD-10-CM | POA: Diagnosis not present

## 2016-06-26 DIAGNOSIS — I509 Heart failure, unspecified: Secondary | ICD-10-CM | POA: Diagnosis not present

## 2016-06-26 DIAGNOSIS — Z993 Dependence on wheelchair: Secondary | ICD-10-CM | POA: Diagnosis not present

## 2016-06-26 DIAGNOSIS — E1151 Type 2 diabetes mellitus with diabetic peripheral angiopathy without gangrene: Secondary | ICD-10-CM | POA: Diagnosis not present

## 2016-06-26 DIAGNOSIS — I872 Venous insufficiency (chronic) (peripheral): Secondary | ICD-10-CM | POA: Diagnosis not present

## 2016-06-28 DIAGNOSIS — Z993 Dependence on wheelchair: Secondary | ICD-10-CM | POA: Diagnosis not present

## 2016-06-28 DIAGNOSIS — I110 Hypertensive heart disease with heart failure: Secondary | ICD-10-CM | POA: Diagnosis not present

## 2016-06-28 DIAGNOSIS — I509 Heart failure, unspecified: Secondary | ICD-10-CM | POA: Diagnosis not present

## 2016-06-28 DIAGNOSIS — Z9181 History of falling: Secondary | ICD-10-CM | POA: Diagnosis not present

## 2016-06-28 DIAGNOSIS — L97529 Non-pressure chronic ulcer of other part of left foot with unspecified severity: Secondary | ICD-10-CM | POA: Diagnosis not present

## 2016-06-28 DIAGNOSIS — I872 Venous insufficiency (chronic) (peripheral): Secondary | ICD-10-CM | POA: Diagnosis not present

## 2016-06-28 DIAGNOSIS — Z4800 Encounter for change or removal of nonsurgical wound dressing: Secondary | ICD-10-CM | POA: Diagnosis not present

## 2016-06-28 DIAGNOSIS — E1151 Type 2 diabetes mellitus with diabetic peripheral angiopathy without gangrene: Secondary | ICD-10-CM | POA: Diagnosis not present

## 2016-06-28 DIAGNOSIS — Z89511 Acquired absence of right leg below knee: Secondary | ICD-10-CM | POA: Diagnosis not present

## 2016-07-02 ENCOUNTER — Telehealth: Payer: Self-pay | Admitting: Family Medicine

## 2016-07-02 NOTE — Telephone Encounter (Signed)
Baker Janus states that pt has another uti and asking for a Rx to be [facility] [place]

## 2016-07-03 DIAGNOSIS — Z9181 History of falling: Secondary | ICD-10-CM | POA: Diagnosis not present

## 2016-07-03 DIAGNOSIS — E1151 Type 2 diabetes mellitus with diabetic peripheral angiopathy without gangrene: Secondary | ICD-10-CM | POA: Diagnosis not present

## 2016-07-03 DIAGNOSIS — L97529 Non-pressure chronic ulcer of other part of left foot with unspecified severity: Secondary | ICD-10-CM | POA: Diagnosis not present

## 2016-07-03 DIAGNOSIS — I110 Hypertensive heart disease with heart failure: Secondary | ICD-10-CM | POA: Diagnosis not present

## 2016-07-03 DIAGNOSIS — Z4800 Encounter for change or removal of nonsurgical wound dressing: Secondary | ICD-10-CM | POA: Diagnosis not present

## 2016-07-03 DIAGNOSIS — Z89511 Acquired absence of right leg below knee: Secondary | ICD-10-CM | POA: Diagnosis not present

## 2016-07-03 DIAGNOSIS — I872 Venous insufficiency (chronic) (peripheral): Secondary | ICD-10-CM | POA: Diagnosis not present

## 2016-07-03 DIAGNOSIS — Z993 Dependence on wheelchair: Secondary | ICD-10-CM | POA: Diagnosis not present

## 2016-07-03 DIAGNOSIS — I509 Heart failure, unspecified: Secondary | ICD-10-CM | POA: Diagnosis not present

## 2016-07-03 NOTE — Telephone Encounter (Signed)
Called and left a detailed message to inform.

## 2016-07-03 NOTE — Telephone Encounter (Signed)
Unable to send prescription w/o pt being seen or having UA/culture

## 2016-07-04 DIAGNOSIS — R0781 Pleurodynia: Secondary | ICD-10-CM | POA: Diagnosis not present

## 2016-07-04 DIAGNOSIS — I77811 Abdominal aortic ectasia: Secondary | ICD-10-CM | POA: Diagnosis not present

## 2016-07-04 DIAGNOSIS — R918 Other nonspecific abnormal finding of lung field: Secondary | ICD-10-CM | POA: Diagnosis not present

## 2016-07-04 DIAGNOSIS — I700 Atherosclerosis of aorta: Secondary | ICD-10-CM | POA: Diagnosis not present

## 2016-07-04 DIAGNOSIS — J181 Lobar pneumonia, unspecified organism: Secondary | ICD-10-CM | POA: Diagnosis not present

## 2016-07-04 DIAGNOSIS — W19XXXA Unspecified fall, initial encounter: Secondary | ICD-10-CM | POA: Diagnosis not present

## 2016-07-08 ENCOUNTER — Encounter: Payer: Self-pay | Admitting: Family Medicine

## 2016-07-08 ENCOUNTER — Telehealth: Payer: Self-pay | Admitting: General Practice

## 2016-07-08 ENCOUNTER — Ambulatory Visit (INDEPENDENT_AMBULATORY_CARE_PROVIDER_SITE_OTHER): Payer: Medicare HMO | Admitting: Family Medicine

## 2016-07-08 VITALS — BP 124/78 | HR 63 | Temp 97.9°F | Resp 18

## 2016-07-08 DIAGNOSIS — E785 Hyperlipidemia, unspecified: Secondary | ICD-10-CM

## 2016-07-08 DIAGNOSIS — R441 Visual hallucinations: Secondary | ICD-10-CM | POA: Diagnosis not present

## 2016-07-08 DIAGNOSIS — Z8639 Personal history of other endocrine, nutritional and metabolic disease: Secondary | ICD-10-CM

## 2016-07-08 DIAGNOSIS — I10 Essential (primary) hypertension: Secondary | ICD-10-CM | POA: Diagnosis not present

## 2016-07-08 DIAGNOSIS — R8290 Unspecified abnormal findings in urine: Secondary | ICD-10-CM

## 2016-07-08 LAB — POCT URINALYSIS DIPSTICK
Bilirubin, UA: NEGATIVE
GLUCOSE UA: NEGATIVE
Ketones, UA: NEGATIVE
NITRITE UA: POSITIVE
Spec Grav, UA: 1.02 (ref 1.010–1.025)
UROBILINOGEN UA: 0.2 U/dL
pH, UA: 6 (ref 5.0–8.0)

## 2016-07-08 MED ORDER — TRIAMCINOLONE ACETONIDE 0.1 % EX OINT: 1.0000 "application " | TOPICAL_OINTMENT | Freq: Two times a day (BID) | CUTANEOUS | 1 refills | Status: DC

## 2016-07-08 MED ORDER — CEPHALEXIN 500 MG PO CAPS: 500.0000 mg | ORAL_CAPSULE | Freq: Two times a day (BID) | ORAL | 0 refills | Status: AC

## 2016-07-08 NOTE — Telephone Encounter (Signed)
noted pt family made aware.

## 2016-07-08 NOTE — Telephone Encounter (Signed)
Pt family advised that you were going to send in a cream for her head? The said that they had discussed with this with you at [facility] today.   Please advise?

## 2016-07-08 NOTE — Patient Instructions (Addendum)
Schedule your Medicare Wellness visit in 6 months and a physical with me at the same time We'll notify you of your lab results and make any changes if needed START the Keflex twice daily- this is at your pharmacy for pick up No evidence of pneumonia at this time- great news! Call with any questions or concerns Have a great summer!!!

## 2016-07-08 NOTE — Progress Notes (Signed)
Pre visit review using our clinic review tool, if applicable. No additional management support is needed unless otherwise documented below in the visit note. 

## 2016-07-08 NOTE — Telephone Encounter (Signed)
Triamcinolone was sent during visit

## 2016-07-08 NOTE — Progress Notes (Signed)
   Subjective:    Patient ID: Christina Pittman, female    DOB: Jun 16, 1919, 83 y.o.   MRN: [id number]  HPI HTN- chronic problem.  On Coreg and Lasix w/ good control.  Denies CP, SOB.  + edema of L leg  Hyperlipidemia- chronic problem, on Simvastatin daily per Cardiology.  Denies abd pain, N/V.  Due for repeat labs.  DM- pt has hx of diet controlled DM.  Was previously on meds but as she aged and lost weight, she was diet controlled and has been under 6 for the last few years.  Due for repeat labs  Delusions- family reports that starting Friday she was having delusions.  Started AZO this weekend w/ improvement in sxs.  Pt reports she 'can't pee b/c I just peed before I left home'.  Family reports that sxs never really cleared after her UTI in May.  No dysuria.     Review of Systems For ROS see HPI     Objective:   Physical Exam  Constitutional: No distress.  Elderly, frail woman in wheel chair  HENT:  Head: Normocephalic and atraumatic.  Cardiovascular: Normal rate and regular rhythm.   Murmur heard. III/VI blowing SEM  Pulmonary/Chest: Effort normal and breath sounds normal. No respiratory distress. She has no wheezes. She has no rales.  Abdominal: Soft. Bowel sounds are normal. She exhibits no distension. There is no tenderness. There is no rebound and no guarding.  Neurological: She is alert.  Pt is alert, oriented to person only Combative when touched- had difficult time w/ BP and had to cancel lab draw for staff safety  Vitals reviewed.         Assessment & Plan:

## 2016-07-09 DIAGNOSIS — E1151 Type 2 diabetes mellitus with diabetic peripheral angiopathy without gangrene: Secondary | ICD-10-CM | POA: Diagnosis not present

## 2016-07-09 DIAGNOSIS — Z9181 History of falling: Secondary | ICD-10-CM | POA: Diagnosis not present

## 2016-07-09 DIAGNOSIS — I110 Hypertensive heart disease with heart failure: Secondary | ICD-10-CM | POA: Diagnosis not present

## 2016-07-09 DIAGNOSIS — Z993 Dependence on wheelchair: Secondary | ICD-10-CM | POA: Diagnosis not present

## 2016-07-09 DIAGNOSIS — I509 Heart failure, unspecified: Secondary | ICD-10-CM | POA: Diagnosis not present

## 2016-07-09 DIAGNOSIS — I872 Venous insufficiency (chronic) (peripheral): Secondary | ICD-10-CM | POA: Diagnosis not present

## 2016-07-09 DIAGNOSIS — Z4800 Encounter for change or removal of nonsurgical wound dressing: Secondary | ICD-10-CM | POA: Diagnosis not present

## 2016-07-09 DIAGNOSIS — L97529 Non-pressure chronic ulcer of other part of left foot with unspecified severity: Secondary | ICD-10-CM | POA: Diagnosis not present

## 2016-07-09 DIAGNOSIS — Z89511 Acquired absence of right leg below knee: Secondary | ICD-10-CM | POA: Diagnosis not present

## 2016-07-09 LAB — URINE CULTURE

## 2016-07-09 NOTE — Assessment & Plan Note (Signed)
Pt's recent A1C's have all been <6.  Not on medication.  Controlling w/ diet at this time.  Diabetes has resolved w/ marked weight loss given her age and frailty.  Will follow.

## 2016-07-09 NOTE — Assessment & Plan Note (Signed)
Recurrent issue.  Seems to occur when pt has UTI.  UA is highly suspicious for infxn.  Start Keflex.  Send for cx.  Will follow.

## 2016-07-09 NOTE — Assessment & Plan Note (Signed)
Chronic problem.  Well controlled on Coreg and Lasix.  Asymptomatic at this time.  Will follow.

## 2016-07-09 NOTE — Assessment & Plan Note (Signed)
Chronic problem.  On statin.  Attempted to get labs today but pt was combative and had to cancel attempt in interest of staff safety.

## 2016-07-10 ENCOUNTER — Telehealth: Payer: Self-pay | Admitting: Family Medicine

## 2016-07-10 NOTE — Telephone Encounter (Signed)
Patient Name: Christina Pittman DOB: 03/24/1919 Initial Comment Caller states, her friend is not sleeping well, not talking straight- confusion, she is on and rx. for her UTI. Nurse Assessment Nurse: Ronnald Ramp, RN, [place] Date/Time (Eastern Time): 07/10/2016 8:40:22 AM Confirm and document reason for call. If symptomatic, describe symptoms. ---Spoke with pt's son and he states the pt is more confused and hallucinating. She is being treated for a UTI and on antibiotics for 2 days. He does not feel it is the right antibiotics. Does the patient have any new or worsening symptoms? ---Yes Will a triage be completed? ---Yes Related visit to physician within the last 2 weeks? ---Yes Does the PT have any chronic conditions? (i.e. diabetes, asthma, etc.) ---Yes List chronic conditions. ---hx of stroke, CHF, Is this a behavioral health or substance abuse call? ---No Guidelines Guideline Title Affirmed Question Affirmed Notes Confusion - Delirium [1] Longstanding confusion (e.g., dementia, stroke) AND [2] worsening Urinary Tract Infection on Antibiotic Follow-up Call - Female [1] Taking antibiotic < 72 hours (3 days) for UTI AND [2] painful urination or frequency not improved Final Disposition User See Physician within 4 Hours (or PCP triage) Ronnald Ramp, RN, [place] Comments Pt's son states when they were in the office on Monday, they ordered a culture. She wants to know if she needs a different antibiotic because her confusion is worse and he feel this is related to a UTI. He would like the MD to review the labs and call him back. Referrals REFERRED TO PCP OFFICE Disagree/Comply: Comply Disagree/Comply: Comply

## 2016-07-10 NOTE — Telephone Encounter (Signed)
Agree  Thank you

## 2016-07-10 NOTE — Telephone Encounter (Signed)
Could you please call and triage pt? Per PCp, her urine did not show any infection and her recommendation is ER due to [place] being unable to draw blood on her in the office (pt was combative)

## 2016-07-10 NOTE — Telephone Encounter (Signed)
Spoke with patients son, [name]. Son reports patient has experienced hallucinations and confusion since yesterday. Informed son that urine culture showed no evidence of infection and patient needs to be evaluated in the Emergency Department. Son verbalized understanding.

## 2016-07-11 ENCOUNTER — Emergency Department (HOSPITAL_BASED_OUTPATIENT_CLINIC_OR_DEPARTMENT_OTHER): Payer: Medicare HMO

## 2016-07-11 ENCOUNTER — Emergency Department (HOSPITAL_BASED_OUTPATIENT_CLINIC_OR_DEPARTMENT_OTHER)
Admission: EM | Admit: 2016-07-11 | Discharge: 2016-07-11 | Disposition: A | Discharge status: Home / Self Care | Payer: Medicare HMO | Attending: Physician Assistant | Admitting: Physician Assistant

## 2016-07-11 ENCOUNTER — Encounter (HOSPITAL_BASED_OUTPATIENT_CLINIC_OR_DEPARTMENT_OTHER): Payer: Self-pay | Admitting: *Deleted

## 2016-07-11 DIAGNOSIS — I5089 Other heart failure: Secondary | ICD-10-CM | POA: Diagnosis not present

## 2016-07-11 DIAGNOSIS — N183 Chronic kidney disease, stage 3 (moderate): Secondary | ICD-10-CM | POA: Insufficient documentation

## 2016-07-11 DIAGNOSIS — I509 Heart failure, unspecified: Secondary | ICD-10-CM | POA: Diagnosis not present

## 2016-07-11 DIAGNOSIS — E119 Type 2 diabetes mellitus without complications: Secondary | ICD-10-CM | POA: Insufficient documentation

## 2016-07-11 DIAGNOSIS — Z79899 Other long term (current) drug therapy: Secondary | ICD-10-CM | POA: Diagnosis not present

## 2016-07-11 DIAGNOSIS — R410 Disorientation, unspecified: Secondary | ICD-10-CM | POA: Diagnosis not present

## 2016-07-11 DIAGNOSIS — F17290 Nicotine dependence, other tobacco product, uncomplicated: Secondary | ICD-10-CM | POA: Insufficient documentation

## 2016-07-11 DIAGNOSIS — I110 Hypertensive heart disease with heart failure: Secondary | ICD-10-CM | POA: Diagnosis not present

## 2016-07-11 DIAGNOSIS — R4182 Altered mental status, unspecified: Secondary | ICD-10-CM | POA: Diagnosis present

## 2016-07-11 LAB — CBC
HEMATOCRIT: 31.5 % — AB (ref 36.0–46.0)
HEMOGLOBIN: 10.2 g/dL — AB (ref 12.0–15.0)
MCH: 28.6 pg (ref 26.0–34.0)
MCHC: 32.4 g/dL (ref 30.0–36.0)
MCV: 88.2 fL (ref 78.0–100.0)
Platelets: 231 10*3/uL (ref 150–400)
RBC: 3.57 MIL/uL — AB (ref 3.87–5.11)
RDW: 15.9 % — ABNORMAL HIGH (ref 11.5–15.5)
WBC: 9.4 10*3/uL (ref 4.0–10.5)

## 2016-07-11 LAB — COMPREHENSIVE METABOLIC PANEL
ALT: 9 U/L — ABNORMAL LOW (ref 14–54)
ANION GAP: 8 (ref 5–15)
AST: 17 U/L (ref 15–41)
Albumin: 2.7 g/dL — ABNORMAL LOW (ref 3.5–5.0)
Alkaline Phosphatase: 64 U/L (ref 38–126)
BUN: 15 mg/dL (ref 6–20)
CHLORIDE: 101 mmol/L (ref 101–111)
CO2: 27 mmol/L (ref 22–32)
Calcium: 7.8 mg/dL — ABNORMAL LOW (ref 8.9–10.3)
Creatinine, Ser: 0.99 mg/dL (ref 0.44–1.00)
GFR calc non Af Amer: 47 mL/min — ABNORMAL LOW (ref >60)
GFR, EST AFRICAN AMERICAN: 54 mL/min — AB (ref >60)
Glucose, Bld: 109 mg/dL — ABNORMAL HIGH (ref 65–99)
POTASSIUM: 3.2 mmol/L — AB (ref 3.5–5.1)
Sodium: 136 mmol/L (ref 135–145)
Total Bilirubin: 1.2 mg/dL (ref 0.3–1.2)
Total Protein: 6 g/dL — ABNORMAL LOW (ref 6.5–8.1)

## 2016-07-11 MED ORDER — SODIUM CHLORIDE 0.9 % IV BOLUS (SEPSIS)
1000.0000 mL | Freq: Once | INTRAVENOUS | Status: AC
  Administered 2016-07-11: 1000 mL via INTRAVENOUS

## 2016-07-11 NOTE — ED Notes (Signed)
Pt eating graham crackers with peanut butter with coke.

## 2016-07-11 NOTE — ED Notes (Signed)
Physician okay with not repeating BP recheck after speaking with pt. Family is very much opposed to taking pt blood pressure due to her thin skin. Heart rate and oxygen levels have been checked.

## 2016-07-11 NOTE — ED Notes (Signed)
Patient is resting comfortably. 

## 2016-07-11 NOTE — Discharge Instructions (Signed)
Follow up with PCP regarding low blood counts. We recommend over the counter iron supplements. Her low blood counts can worsen her fatigue.   Christina Pittman other labs looked good. Her chest x-ray did not show a pneumonia and showed some chronic lung changes that do not require treatment right now. Her CT head was normal.

## 2016-07-11 NOTE — ED Provider Notes (Signed)
[facility] DEPT [facility] Provider Note   CSN: [id number] Arrival date & time: 07/11/16  1135     History   Chief Complaint Chief Complaint  Patient presents with  . Altered Mental Status    HPI Christina Pittman is a 83 y.o. female.  HPI   83 year old female with complicated PMH including CHF, aortic stenosis, HTN, T2DM, and history of CVA  presenting with confusion. Family members and home care aid report that Christina Pittman has become confused over the past 2.5 weeks. She has had been seeing family members who are not present as well as seeing images such as snakes, apples, gypsies etc. According to the family, she is normally completely oriented and is "very sharp".   She was seen by PCP a few days ago for confusion as well as malodorous, dark urine. UA obtained was concerning for infectious process and patient was started on Keflex. Per family, patient has problem with recurrent UTIs and frequently exhibits confusion when she has an infection. Urine culture subsequently grew multiple species and PCP contacted family to let them know that she did not appear to have an active UTI at this time.   Patient fell yesterday when she thought she saw a family member and she got up out of her wheelchair and slipped forward. She landed on the side of her body. Hit her head in the grass and lower extremities on the concrete. Fall was witnessed to be without LOC. She subsequently developed bruising on multiple parts of her body.   She has had decreased PO intake of liquids and solids for several weeks.   Past Medical History:  Diagnosis Date  . Aneurysm of thoracic aorta ([place])   . Aortic stenosis    a. moderate to severe by echo in 03/2011. Vmax 0.78cm^2  . Arthritis   . Aspiration pneumonia ([place])   . Bilateral venous insufficiency   . Chicken pox   . Diabetes mellitus   . Dysphagia   . Fractured pelvis ([place]) 05/14/2011  . Gangrene of foot ([place])   . Heart murmur   . Hemiparesis, right ([place])   .  Hyperlipemia   . Hypertension   . Insomnia   . Pituitary macroadenoma ([place])   . Stroke [facility])    feb 2013    Patient Active Problem List   Diagnosis Date Noted  . CHF (congestive heart failure) ([place]) 01/10/2016  . TIA (transient ischemic attack) 03/23/2015  . CKD (chronic kidney disease), stage III 03/23/2015  . Acute UTI 03/23/2015  . New onset atrial fibrillation ([place])   . Aortic stenosis   . Eczema 01/04/2015  . GI bleed 12/16/2013  . Acute renal insufficiency 12/16/2013  . Noninfected skin tear of left leg 10/29/2013  . Routine general medical examination at a health care facility 04/01/2013  . Impaired mobility 12/03/2012  . S/P AKA (above knee amputation) ([place]) 12/03/2012  . Skin tear of right forearm without complication 47/09/6281  . Toe pain 05/28/2012  . Hallucinations, visual 02/27/2012  . Allergic rhinitis 01/08/2012  . Cellulitis 12/24/2011  . Blood blister 10/25/2011  . Dysuria 10/10/2011  . Wound of left leg 07/26/2011  . Edema 07/12/2011  . Pituitary macroadenoma ([place]) 05/21/2011  . Aspiration pneumonia ([place]) 04/25/2011  . Aneurysm of thoracic aorta ([place]) 04/25/2011  . Venous insufficiency of both lower extremities 04/09/2011  . Fall at home 04/09/2011  . Hx of arterial ischemic stroke 03/14/2011  . HTN (hypertension) 03/14/2011  . Hx of type 2 diabetes  mellitus 03/14/2011  . Insomnia 03/14/2011  . Hemiparesis, right ([place]) 03/14/2011  . HLD (hyperlipidemia) 03/14/2011  . Dysphagia 03/14/2011    Past Surgical History:  Procedure Laterality Date  . ABDOMINAL HYSTERECTOMY    . ABOVE KNEE LEG AMPUTATION      OB History    No data available       Home Medications    Prior to Admission medications   Medication Sig Start Date End Date Taking? Authorizing Provider  apixaban (ELIQUIS) 5 MG TABS tablet Take 1 tablet (5 mg total) by mouth 2 (two) times daily. 02/26/16   Strader, Fransisco Hertz, PA-C  aspirin EC 81 MG tablet Take 81 mg by mouth daily.     [provider]  carvedilol (COREG) 12.5 MG tablet Take 1 tablet (12.5 mg total) by mouth 2 (two) times daily. 02/26/16   Strader, Fransisco Hertz, PA-C  cephALEXin (KEFLEX) 500 MG capsule Take 1 capsule (500 mg total) by mouth 2 (two) times daily. 07/08/16 07/18/16  Midge Minium, MD  furosemide (LASIX) 20 MG tablet TAKE ONE TABLET BY MOUTH TWICE DAILY AS NEEDED FOR  SWELLING 06/21/16   Midge Minium, MD  NONFORMULARY OR COMPOUNDED ITEM Pt is in need of a Right AK Socket for her prosthetic. 06/02/13   Midge Minium, MD  ondansetron (ZOFRAN ODT) 4 MG disintegrating tablet 4mg  ODT q4 hours prn nausea/vomit 02/01/16   Brunetta Jeans, PA-C  potassium chloride SA (K-DUR,KLOR-CON) 20 MEQ tablet Take 1 tablet (20 mEq total) by mouth daily. 02/26/16   Strader, Fransisco Hertz, PA-C  simvastatin (ZOCOR) 20 MG tablet TAKE ONE TABLET BY MOUTH ONCE DAILY AT 6PM 02/26/16   Ahmed Prima, [place] M, PA-C  triamcinolone ointment (KENALOG) 0.1 % Apply 1 application topically 2 (two) times daily. 07/08/16 07/08/17  Midge Minium, MD    Family History Family History  Problem Relation Age of Onset  . Arthritis Mother   . Cancer Father   . Diabetes Sister   . Heart disease Brother        MI  . Diabetes Brother   . Liver disease Brother   . Heart attack Brother   . Clotting disorder Sister        blood clot    Social History Social History  Substance Use Topics  . Smoking status: Never Smoker  . Smokeless tobacco: Current User    Types: Snuff  . Alcohol use No     Allergies   Betadine [povidone iodine]; Equalyte; Neosporin [neomycin-polymyxin-gramicidin]; and Codeine   Review of Systems Review of Systems  Constitutional: Positive for appetite change and fatigue. Negative for chills and fever.  HENT: Negative for congestion, rhinorrhea and sore throat.   Eyes: Negative for discharge.  Respiratory: Negative for cough, chest tightness, shortness of breath and wheezing.     Cardiovascular: Negative for chest pain.  Gastrointestinal: Negative for abdominal distention, abdominal pain, diarrhea, nausea and vomiting.  Genitourinary: Negative for dysuria.  Psychiatric/Behavioral: Positive for agitation, confusion and hallucinations.     Physical Exam Updated Vital Signs Pulse 70   Temp 98.1 F (36.7 C)   Resp 17   SpO2 98%   Physical Exam  Constitutional:  Frail, thin elderly female   HENT:  Mucus membranes and lips appear dry.   Cardiovascular: Normal rate, regular rhythm and intact distal pulses.   Murmur heard. 3/6 blowing systolic murmur.   Pulmonary/Chest: Effort normal and breath sounds normal. No respiratory distress.  Abdominal: Soft. Bowel sounds are  normal. She exhibits no distension. There is no tenderness. There is no rebound and no guarding.  Musculoskeletal:  Right AKA.   Neurological: No sensory deficit.  Oriented to person and place. States year is "404". Aware that it is summer. Able to name family/care takers present in room. Strength equal in all extremities.   Skin:  Various stages of bruising present diffusely over body, most notably on face, right shoulder, right hand, left leg, chest wall.      ED Treatments / Results  Labs (all labs ordered are listed, but only abnormal results are displayed) Labs Reviewed  COMPREHENSIVE METABOLIC PANEL - Abnormal; Notable for the following:       Result Value   Potassium 3.2 (*)    Glucose, Bld 109 (*)    Calcium 7.8 (*)    Total Protein 6.0 (*)    Albumin 2.7 (*)    ALT 9 (*)    GFR calc non Af Amer 47 (*)    GFR calc Af Amer 54 (*)    All other components within normal limits  CBC - Abnormal; Notable for the following:    RBC 3.57 (*)    Hemoglobin 10.2 (*)    HCT 31.5 (*)    RDW 15.9 (*)    All other components within normal limits  URINALYSIS, ROUTINE W REFLEX MICROSCOPIC  IRON AND TIBC  FERRITIN    EKG  EKG Interpretation None       Radiology Dg Chest 2  View  Result Date: 07/11/2016 CLINICAL DATA:  Confusion with hallucinations EXAM: CHEST  2 VIEW COMPARISON:  January 02, 2016 and December 09, 2013 FINDINGS: There is underlying interstitial fibrosis. There is interstitial edema with bilateral pleural effusions. There is atelectatic change in the medial left base. There is cardiomegaly with pulmonary venous hypertension. There is aortic atherosclerosis. There is also calcification in both axillary and subclavian arteries. No adenopathy. Bones are osteoporotic. There is lumbar levoscoliosis. IMPRESSION: Chronic congestive heart failure superimposed on pulmonary fibrotic change. Atelectasis medial left base without frank consolidation. Stable cardiomegaly. There is widespread aortic atherosclerosis as well as calcification in both subclavian and carotid arteries. Bones osteoporotic. Electronically Signed   By: Lowella Grip III M.D.   On: 07/11/2016 13:52   Ct Head Wo Contrast  Result Date: 07/11/2016 CLINICAL DATA:  Confusion since last week. EXAM: CT HEAD WITHOUT CONTRAST TECHNIQUE: Contiguous axial images were obtained from the base of the skull through the vertex without intravenous contrast. COMPARISON:  Brain MR dated 03/23/2015 and head CT dated 03/22/2015. FINDINGS: Brain: Diffusely enlarged ventricles and subarachnoid spaces. Patchy white matter low density in both cerebral hemispheres. Stable old right posterior watershed cerebral infarct, old bilateral basal ganglia lacunar infarcts and old left corona radiata lacunar infarct. No intracranial hemorrhage, mass lesion or CT evidence of acute infarction. Vascular: No hyperdense vessel or unexpected calcification. Skull: Normal. Negative for fracture or focal lesion. Sinuses/Orbits: Unremarkable. Other: Grossly stable pituitary mass, better seen on the previous MR. IMPRESSION: 1. No acute abnormality. 2. Stable atrophy, chronic small vessel white matter ischemic changes and old infarcts. 3. Stable  pituitary mass. Electronically Signed   By: Claudie Revering M.D.   On: 07/11/2016 14:06    Procedures Procedures (including critical care time)  Medications Ordered in ED Medications  sodium chloride 0.9 % bolus 1,000 mL (0 mLs Intravenous Stopped 07/11/16 1526)     Initial Impression / Assessment and Plan / ED Course  I have reviewed the triage vital  signs and the nursing notes.  Pertinent labs & imaging results that were available during my care of the patient were reviewed by me and considered in my medical decision making (see chart for details).    83 year old female presenting with confusion. Family very concerned that patient may be dehydrated due to report of decreased PO intake. Patient is sipping some fluids in the ED. Patient afebrile and vital signs stable. Family repeatedly refused BP reading due to concern that it would hurt patient despite discussion that blood pressure was important to access hydration status, etc.  After several attempts, were able to gain IV access on patient. Given 1L NS bolus. CBC only remarkable for mild anemia. CMET unremarkable.   CXR showed chronic congestive heart failure and stable cardiomegaly without signs of acute volume excess. Atelectasis present but no signs of consolidation or infiltrates suggestive of infection. CT head obtained and was negative for acute process.   On re-evaluation patient feeling much better and requesting food. Suspect that confusion may be related to worsening of dementia with age. Discussed with family that her anemia may be contributing to fatigue and could cause a generalized sensation of weakness but that would recommend outpatient work up as hemoglobin not typically considered low enough to warrant transfusion and that hospitalization may incite delirium. Family in agreement that discharge home with PCP follow up is the best plan for patient. Have sent iron studies as add on labs. If lab is able to perform, these will be  available for review by PCP. Have recommended trial of iron supplementation and discussed potential side effects of medication. Encouraged good PO hydration. Return precautions discussed.    Final Clinical Impressions(s) / ED Diagnoses   Final diagnoses:  Confusion    New Prescriptions New Prescriptions   No medications on file     Nicolette Bang, DO 07/11/16 1534    Macarthur Critchley, MD 07/12/16 [id number]

## 2016-07-11 NOTE — ED Notes (Signed)
Patient transported to radiology

## 2016-07-11 NOTE — ED Notes (Signed)
IV attempt x2 by Karna Christmas, RN, x2 by Donella Stade, RT, x1 by this nurse. Unsuccessful. Family is expressing frusteration with staff, "I didn't know they were gonna do all this, I just wanted to know if she was dehydrated". Attempted to educate patient and family as the rationale behind needing blood work.   EDP notified of inability to obtain labs, or IV access, as well as family's frustrations. Awaiting additional orders.

## 2016-07-11 NOTE — ED Notes (Signed)
Family at bedside. 

## 2016-07-11 NOTE — ED Triage Notes (Signed)
Confusion since last week. Possible dehydration per home health nurse this am.

## 2016-07-15 ENCOUNTER — Telehealth: Payer: Self-pay | Admitting: Family Medicine

## 2016-07-15 DIAGNOSIS — W19XXXA Unspecified fall, initial encounter: Secondary | ICD-10-CM

## 2016-07-15 DIAGNOSIS — R441 Visual hallucinations: Secondary | ICD-10-CM

## 2016-07-15 DIAGNOSIS — Y92009 Unspecified place in unspecified non-institutional (private) residence as the place of occurrence of the external cause: Secondary | ICD-10-CM

## 2016-07-15 DIAGNOSIS — S81802A Unspecified open wound, left lower leg, initial encounter: Secondary | ICD-10-CM

## 2016-07-15 NOTE — Telephone Encounter (Signed)
Daughter states that pt fell and scraped her leg. Daughter would like for Nanine Means [phone] to be called and come in to assist with care while pt recovers from fall.

## 2016-07-15 NOTE — Telephone Encounter (Signed)
Did pt receive any other care from the ED/ER in the lat couple of weeks.

## 2016-07-15 NOTE — Telephone Encounter (Signed)
New referral to [facility] home health.

## 2016-07-15 NOTE — Telephone Encounter (Signed)
I think we would need to restart her services

## 2016-07-18 DIAGNOSIS — I739 Peripheral vascular disease, unspecified: Secondary | ICD-10-CM | POA: Diagnosis not present

## 2016-07-18 DIAGNOSIS — I10 Essential (primary) hypertension: Secondary | ICD-10-CM | POA: Diagnosis not present

## 2016-07-18 DIAGNOSIS — E1151 Type 2 diabetes mellitus with diabetic peripheral angiopathy without gangrene: Secondary | ICD-10-CM | POA: Diagnosis not present

## 2016-07-18 DIAGNOSIS — Z4800 Encounter for change or removal of nonsurgical wound dressing: Secondary | ICD-10-CM | POA: Diagnosis not present

## 2016-07-18 DIAGNOSIS — I872 Venous insufficiency (chronic) (peripheral): Secondary | ICD-10-CM | POA: Diagnosis not present

## 2016-07-22 ENCOUNTER — Other Ambulatory Visit (HOSPITAL_COMMUNITY)
Admission: RE | Admit: 2016-07-22 | Discharge: 2016-07-22 | Disposition: A | Discharge status: Home / Self Care | Payer: Medicare HMO | Source: Other Acute Inpatient Hospital | Attending: Family Medicine | Admitting: Family Medicine

## 2016-07-22 ENCOUNTER — Telehealth: Payer: Self-pay | Admitting: Family Medicine

## 2016-07-22 DIAGNOSIS — N390 Urinary tract infection, site not specified: Secondary | ICD-10-CM | POA: Insufficient documentation

## 2016-07-22 DIAGNOSIS — Z4800 Encounter for change or removal of nonsurgical wound dressing: Secondary | ICD-10-CM | POA: Diagnosis not present

## 2016-07-22 DIAGNOSIS — E1151 Type 2 diabetes mellitus with diabetic peripheral angiopathy without gangrene: Secondary | ICD-10-CM | POA: Diagnosis not present

## 2016-07-22 DIAGNOSIS — I872 Venous insufficiency (chronic) (peripheral): Secondary | ICD-10-CM | POA: Diagnosis not present

## 2016-07-22 DIAGNOSIS — I739 Peripheral vascular disease, unspecified: Secondary | ICD-10-CM | POA: Diagnosis not present

## 2016-07-22 DIAGNOSIS — I10 Essential (primary) hypertension: Secondary | ICD-10-CM | POA: Diagnosis not present

## 2016-07-22 LAB — URINALYSIS, ROUTINE W REFLEX MICROSCOPIC
Bilirubin Urine: NEGATIVE
GLUCOSE, UA: NEGATIVE mg/dL
Ketones, ur: NEGATIVE mg/dL
NITRITE: POSITIVE — AB
PROTEIN: NEGATIVE mg/dL
Specific Gravity, Urine: 1.008 (ref 1.005–1.030)
pH: 6 (ref 5.0–8.0)

## 2016-07-22 NOTE — Telephone Encounter (Signed)
Verbal ok for I&O cath given. Christina Pittman from [facility] stated she would speak with pt and family about going to the ER. At this time the pt is determined that she "is not going anywhere".

## 2016-07-22 NOTE — Telephone Encounter (Signed)
Caller with Christina Pittman states that she saw pt today and that pt was delusional, pt son has stated that pt had not gone to the restroom since last night. Caller asking for a verbal order to do in home catheterization, please advise

## 2016-07-22 NOTE — Telephone Encounter (Signed)
Please advise 

## 2016-07-22 NOTE — Telephone Encounter (Signed)
[place] for I&O cath but this likely will not help her delusions.  If delusions persist, she will need to go to ER

## 2016-07-22 NOTE — Telephone Encounter (Signed)
Spoke with nurse, culture and sensitivities also added.

## 2016-07-24 NOTE — Telephone Encounter (Signed)
fyi

## 2016-07-24 NOTE — Telephone Encounter (Signed)
Christina Pittman with [facility] calling to report Christina Pittman, home health nurse dropped off urine sample. However, the phlebotomist failed to obtain the culture and sensitivities.    Christina Pittman has been informed and she will be dropping off another sample tomorrow in order for the culture and sensitivities to be done.  Christina Pittman can be reached at [phone], if there are any questions.

## 2016-07-25 ENCOUNTER — Other Ambulatory Visit (HOSPITAL_COMMUNITY)
Admission: RE | Admit: 2016-07-25 | Discharge: 2016-07-25 | Disposition: A | Discharge status: Home / Self Care | Payer: Medicare HMO | Source: Other Acute Inpatient Hospital | Attending: Family Medicine | Admitting: Family Medicine

## 2016-07-25 DIAGNOSIS — I872 Venous insufficiency (chronic) (peripheral): Secondary | ICD-10-CM | POA: Diagnosis not present

## 2016-07-25 DIAGNOSIS — I739 Peripheral vascular disease, unspecified: Secondary | ICD-10-CM | POA: Diagnosis not present

## 2016-07-25 DIAGNOSIS — I10 Essential (primary) hypertension: Secondary | ICD-10-CM | POA: Diagnosis not present

## 2016-07-25 DIAGNOSIS — N390 Urinary tract infection, site not specified: Secondary | ICD-10-CM | POA: Insufficient documentation

## 2016-07-25 DIAGNOSIS — E1151 Type 2 diabetes mellitus with diabetic peripheral angiopathy without gangrene: Secondary | ICD-10-CM | POA: Diagnosis not present

## 2016-07-25 DIAGNOSIS — Z4800 Encounter for change or removal of nonsurgical wound dressing: Secondary | ICD-10-CM | POA: Diagnosis not present

## 2016-07-27 LAB — URINE CULTURE

## 2016-07-29 DIAGNOSIS — I509 Heart failure, unspecified: Secondary | ICD-10-CM | POA: Diagnosis not present

## 2016-07-29 DIAGNOSIS — I130 Hypertensive heart and chronic kidney disease with heart failure and stage 1 through stage 4 chronic kidney disease, or unspecified chronic kidney disease: Secondary | ICD-10-CM | POA: Diagnosis not present

## 2016-07-29 DIAGNOSIS — S81802D Unspecified open wound, left lower leg, subsequent encounter: Secondary | ICD-10-CM | POA: Diagnosis not present

## 2016-07-29 DIAGNOSIS — S81002D Unspecified open wound, left knee, subsequent encounter: Secondary | ICD-10-CM | POA: Diagnosis not present

## 2016-07-29 DIAGNOSIS — F0390 Unspecified dementia without behavioral disturbance: Secondary | ICD-10-CM | POA: Diagnosis not present

## 2016-07-29 DIAGNOSIS — I4891 Unspecified atrial fibrillation: Secondary | ICD-10-CM | POA: Diagnosis not present

## 2016-07-29 DIAGNOSIS — N183 Chronic kidney disease, stage 3 (moderate): Secondary | ICD-10-CM | POA: Diagnosis not present

## 2016-07-29 DIAGNOSIS — J8410 Pulmonary fibrosis, unspecified: Secondary | ICD-10-CM | POA: Diagnosis not present

## 2016-07-29 DIAGNOSIS — E1122 Type 2 diabetes mellitus with diabetic chronic kidney disease: Secondary | ICD-10-CM | POA: Diagnosis not present

## 2016-07-30 ENCOUNTER — Telehealth: Payer: Self-pay | Admitting: *Deleted

## 2016-07-30 ENCOUNTER — Telehealth: Payer: Self-pay | Admitting: Family Medicine

## 2016-07-30 NOTE — Telephone Encounter (Signed)
Patient's daughter calling to see if pcp was going to call in medication for UTI.

## 2016-07-30 NOTE — Telephone Encounter (Signed)
I spoke with the daughter and advised of Christina Pittman's recommendations. I advised that pt's wound needs to be changed at least twice daily, not only when the dressing needs to be changed. Pt family was also advised that we cannot send in any antibiotic without a repeat urine. Pt daughter did not seem to understand this even after multiple attempts of explaining this.   Pt family was advised that pt may need to go back to ER if her mood changes, if UTI symptoms present (pt family denies any symptoms at this time), or if her wound gets worse. Pt daughter was a adamant that this is not what her mother would want, and she would refuse to go.   Pt daughter was advised that we would wait for the UA and culture to ensure that the pt does indeed have an infection before anything is sent in for the pt.

## 2016-07-30 NOTE — Telephone Encounter (Signed)
Christina Pittman with nurse Elmyra Ricks and she advised that the earliest she can make it back out to the house is on Thursday to repeat the urine and culture.   Nurse advised that the family has hired a caregiver who is present with the patient (young girl who came to office with pt at last visit) "Caregiver is trying to convince family to change providers as Dr. Birdie Riddle is not caring correctly for this pt" (per home health nurse).   Elmyra Ricks advised that the patient disposition seems the same to her, she also advised that they go out and change the pt's wound twice a week and to her knowledge the pt family does not change the dressings at any other time.

## 2016-07-30 NOTE — Telephone Encounter (Signed)
If they are refusing appointment or ER assessment then I am limited in what I will do without a clean urine specimen for culture. They are denying patient having any current symptoms or acute confusion which [place] seems to agree with. I still have significant concerns about patient remaining at home and relying on family as caregivers -- not sure if this is an educational issue for the family (proper caregiving) or something else. Will defer chronic home care decisions to PCP. Question if social work referral is needed to assess home environment.

## 2016-07-30 NOTE — Telephone Encounter (Signed)
Please call patient's family and let them know we have been contacted regarding this. Is she getting true care at home from family? It sounds like she may need more in depth care until this wound is healed. Also it seems they have requested repeat UA. Please assess how patient is doing presently -- she may need ER assessment. I have significant concerns about her care at home.   I will forward this note to her PCP. Just reviewing in PCP absence.

## 2016-07-30 NOTE — Telephone Encounter (Signed)
Called pt daughter back. Yesterday she advised that she was not concerned and they did not want a repeat U/A and culture. Pt daughter was advised that if she would liked a new order placed to have home health nurse nicole call and I will give a verbal order.

## 2016-07-30 NOTE — Telephone Encounter (Signed)
Nurse with home health called in to let us know that she went to change her wound yesterday and there were about 50 maggots in the wound.   The nurse cleaned them out, and she feels like she got them all and cleaned it up.  She is going back out Thursday to check it again - but wanted [place] to be aware of this.

## 2016-08-01 ENCOUNTER — Other Ambulatory Visit (HOSPITAL_COMMUNITY)
Admission: AD | Admit: 2016-08-01 | Discharge: 2016-08-01 | Disposition: A | Discharge status: Home / Self Care | Payer: Medicare HMO | Source: Other Acute Inpatient Hospital | Attending: Family Medicine | Admitting: Family Medicine

## 2016-08-01 DIAGNOSIS — S81002D Unspecified open wound, left knee, subsequent encounter: Secondary | ICD-10-CM | POA: Diagnosis not present

## 2016-08-01 DIAGNOSIS — J8410 Pulmonary fibrosis, unspecified: Secondary | ICD-10-CM | POA: Diagnosis not present

## 2016-08-01 DIAGNOSIS — E1122 Type 2 diabetes mellitus with diabetic chronic kidney disease: Secondary | ICD-10-CM | POA: Diagnosis not present

## 2016-08-01 DIAGNOSIS — N390 Urinary tract infection, site not specified: Secondary | ICD-10-CM | POA: Insufficient documentation

## 2016-08-01 DIAGNOSIS — I509 Heart failure, unspecified: Secondary | ICD-10-CM | POA: Diagnosis not present

## 2016-08-01 DIAGNOSIS — I130 Hypertensive heart and chronic kidney disease with heart failure and stage 1 through stage 4 chronic kidney disease, or unspecified chronic kidney disease: Secondary | ICD-10-CM | POA: Diagnosis not present

## 2016-08-01 DIAGNOSIS — I4891 Unspecified atrial fibrillation: Secondary | ICD-10-CM | POA: Diagnosis not present

## 2016-08-01 DIAGNOSIS — S81802D Unspecified open wound, left lower leg, subsequent encounter: Secondary | ICD-10-CM | POA: Diagnosis not present

## 2016-08-01 DIAGNOSIS — N183 Chronic kidney disease, stage 3 (moderate): Secondary | ICD-10-CM | POA: Diagnosis not present

## 2016-08-01 DIAGNOSIS — F0390 Unspecified dementia without behavioral disturbance: Secondary | ICD-10-CM | POA: Diagnosis not present

## 2016-08-01 LAB — URINALYSIS, ROUTINE W REFLEX MICROSCOPIC
Bilirubin Urine: NEGATIVE
GLUCOSE, UA: NEGATIVE mg/dL
HGB URINE DIPSTICK: NEGATIVE
Ketones, ur: NEGATIVE mg/dL
NITRITE: NEGATIVE
PH: 8 (ref 5.0–8.0)
RBC / HPF: NONE SEEN RBC/hpf (ref 0–5)
SPECIFIC GRAVITY, URINE: 1.011 (ref 1.005–1.030)

## 2016-08-01 NOTE — Telephone Encounter (Signed)
Called and reminded Elmyra Ricks (home health nurse) to get the UA and send for C/S. Elmyra Ricks advised that pt refused I/O cath this morning. Elmyra Ricks advised that pt family would collect a clean catch and call her when urine is ready so she can drop off for tests.

## 2016-08-01 NOTE — Telephone Encounter (Signed)
Noted, spoke with family and they ensured it would be a clean catch. Will await urine results.

## 2016-08-01 NOTE — Telephone Encounter (Signed)
Needs to be a cathed specimen to ensure valid. I have serious doubts about family's ability to collect a clean catch on this patient.

## 2016-08-03 LAB — URINE CULTURE

## 2016-08-05 ENCOUNTER — Encounter: Payer: Self-pay | Admitting: Family Medicine

## 2016-08-05 ENCOUNTER — Telehealth: Payer: Self-pay | Admitting: Family Medicine

## 2016-08-05 DIAGNOSIS — E1122 Type 2 diabetes mellitus with diabetic chronic kidney disease: Secondary | ICD-10-CM | POA: Diagnosis not present

## 2016-08-05 DIAGNOSIS — N183 Chronic kidney disease, stage 3 (moderate): Secondary | ICD-10-CM | POA: Diagnosis not present

## 2016-08-05 DIAGNOSIS — F0390 Unspecified dementia without behavioral disturbance: Secondary | ICD-10-CM | POA: Diagnosis not present

## 2016-08-05 DIAGNOSIS — J8410 Pulmonary fibrosis, unspecified: Secondary | ICD-10-CM | POA: Diagnosis not present

## 2016-08-05 DIAGNOSIS — I4891 Unspecified atrial fibrillation: Secondary | ICD-10-CM | POA: Diagnosis not present

## 2016-08-05 DIAGNOSIS — S81002D Unspecified open wound, left knee, subsequent encounter: Secondary | ICD-10-CM | POA: Diagnosis not present

## 2016-08-05 DIAGNOSIS — I509 Heart failure, unspecified: Secondary | ICD-10-CM | POA: Diagnosis not present

## 2016-08-05 DIAGNOSIS — S81802D Unspecified open wound, left lower leg, subsequent encounter: Secondary | ICD-10-CM | POA: Diagnosis not present

## 2016-08-05 DIAGNOSIS — I130 Hypertensive heart and chronic kidney disease with heart failure and stage 1 through stage 4 chronic kidney disease, or unspecified chronic kidney disease: Secondary | ICD-10-CM | POA: Diagnosis not present

## 2016-08-05 NOTE — Telephone Encounter (Signed)
Spoke with [name] and advised that abx would not be rx'd based on most recent urinalysis. He stated that his mothers nurse had a paper that showed she has a "severe infection." I advised that if she has had a UA since 7/5 we do not have those results.

## 2016-08-05 NOTE — Telephone Encounter (Signed)
Caller with Christina Pittman states that the urine sample for pt was dropped off at lab and results should be in Epic for KT to view. Calling asking what next step for pt would be, please advise.

## 2016-08-05 NOTE — Telephone Encounter (Signed)
Patient dismissed from [facility] by Annye Asa MD , effective August 05, 2016. Dismissal letter sent out by certified / registered mail.  daj

## 2016-08-05 NOTE — Telephone Encounter (Signed)
According to the urine culture from 7/5- there is no infection to treat.  I'm sorry they are not happy with this information, but I will not give abx just for the sake of giving medication.  Her urine is dark b/c she is not adequately hydrated and will have an odor for the same reason.  The family is not allowed to be abusive towards staff and at this time, pt will be dismissed from this office.

## 2016-08-05 NOTE — Telephone Encounter (Signed)
Received call from [place] (caretaker not on [place]) stating that she heard from daughter Baker Janus) that no infection was found when Lexine Baton has the lab results showing that pt does and that they are getting pissed off that KT with not call in an abx to treat pt, I let caller know that she was not on DPR and that we could not disclose any information with her, she stated that the daughter did not know anything regarding pt and to call [name] her son. While typing this message [name] called in screaming to [address]..If her (patient) pee stinks and its dark then there is an infection. Per a phone call he received from [place], [place] advise him that we are sending a message to pcp, for someone to call [name] back, he said whatever and hung up.

## 2016-08-08 DIAGNOSIS — S81802D Unspecified open wound, left lower leg, subsequent encounter: Secondary | ICD-10-CM | POA: Diagnosis not present

## 2016-08-08 DIAGNOSIS — J8410 Pulmonary fibrosis, unspecified: Secondary | ICD-10-CM | POA: Diagnosis not present

## 2016-08-08 DIAGNOSIS — S81002D Unspecified open wound, left knee, subsequent encounter: Secondary | ICD-10-CM | POA: Diagnosis not present

## 2016-08-08 DIAGNOSIS — I130 Hypertensive heart and chronic kidney disease with heart failure and stage 1 through stage 4 chronic kidney disease, or unspecified chronic kidney disease: Secondary | ICD-10-CM | POA: Diagnosis not present

## 2016-08-08 DIAGNOSIS — F0390 Unspecified dementia without behavioral disturbance: Secondary | ICD-10-CM | POA: Diagnosis not present

## 2016-08-08 DIAGNOSIS — I509 Heart failure, unspecified: Secondary | ICD-10-CM | POA: Diagnosis not present

## 2016-08-08 DIAGNOSIS — E1122 Type 2 diabetes mellitus with diabetic chronic kidney disease: Secondary | ICD-10-CM | POA: Diagnosis not present

## 2016-08-08 DIAGNOSIS — N183 Chronic kidney disease, stage 3 (moderate): Secondary | ICD-10-CM | POA: Diagnosis not present

## 2016-08-08 DIAGNOSIS — I4891 Unspecified atrial fibrillation: Secondary | ICD-10-CM | POA: Diagnosis not present

## 2016-08-09 DIAGNOSIS — Z0000 Encounter for general adult medical examination without abnormal findings: Secondary | ICD-10-CM | POA: Diagnosis not present

## 2016-08-09 DIAGNOSIS — S81002D Unspecified open wound, left knee, subsequent encounter: Secondary | ICD-10-CM | POA: Diagnosis not present

## 2016-08-09 DIAGNOSIS — F0390 Unspecified dementia without behavioral disturbance: Secondary | ICD-10-CM | POA: Diagnosis not present

## 2016-08-09 DIAGNOSIS — L97909 Non-pressure chronic ulcer of unspecified part of unspecified lower leg with unspecified severity: Secondary | ICD-10-CM | POA: Diagnosis not present

## 2016-08-09 DIAGNOSIS — I38 Endocarditis, valve unspecified: Secondary | ICD-10-CM | POA: Diagnosis not present

## 2016-08-09 DIAGNOSIS — I10 Essential (primary) hypertension: Secondary | ICD-10-CM | POA: Diagnosis not present

## 2016-08-09 DIAGNOSIS — E119 Type 2 diabetes mellitus without complications: Secondary | ICD-10-CM | POA: Diagnosis not present

## 2016-08-09 DIAGNOSIS — S81802D Unspecified open wound, left lower leg, subsequent encounter: Secondary | ICD-10-CM | POA: Diagnosis not present

## 2016-08-11 DIAGNOSIS — I10 Essential (primary) hypertension: Secondary | ICD-10-CM | POA: Diagnosis not present

## 2016-08-11 DIAGNOSIS — F0390 Unspecified dementia without behavioral disturbance: Secondary | ICD-10-CM | POA: Diagnosis not present

## 2016-08-13 DIAGNOSIS — N183 Chronic kidney disease, stage 3 (moderate): Secondary | ICD-10-CM | POA: Diagnosis not present

## 2016-08-13 DIAGNOSIS — F0390 Unspecified dementia without behavioral disturbance: Secondary | ICD-10-CM | POA: Diagnosis not present

## 2016-08-13 DIAGNOSIS — I509 Heart failure, unspecified: Secondary | ICD-10-CM | POA: Diagnosis not present

## 2016-08-13 DIAGNOSIS — I4891 Unspecified atrial fibrillation: Secondary | ICD-10-CM | POA: Diagnosis not present

## 2016-08-13 DIAGNOSIS — J8410 Pulmonary fibrosis, unspecified: Secondary | ICD-10-CM | POA: Diagnosis not present

## 2016-08-13 DIAGNOSIS — I130 Hypertensive heart and chronic kidney disease with heart failure and stage 1 through stage 4 chronic kidney disease, or unspecified chronic kidney disease: Secondary | ICD-10-CM | POA: Diagnosis not present

## 2016-08-13 DIAGNOSIS — E1122 Type 2 diabetes mellitus with diabetic chronic kidney disease: Secondary | ICD-10-CM | POA: Diagnosis not present

## 2016-08-13 DIAGNOSIS — S81802D Unspecified open wound, left lower leg, subsequent encounter: Secondary | ICD-10-CM | POA: Diagnosis not present

## 2016-08-13 DIAGNOSIS — S81002D Unspecified open wound, left knee, subsequent encounter: Secondary | ICD-10-CM | POA: Diagnosis not present

## 2016-08-15 DIAGNOSIS — N183 Chronic kidney disease, stage 3 (moderate): Secondary | ICD-10-CM | POA: Diagnosis not present

## 2016-08-15 DIAGNOSIS — I130 Hypertensive heart and chronic kidney disease with heart failure and stage 1 through stage 4 chronic kidney disease, or unspecified chronic kidney disease: Secondary | ICD-10-CM | POA: Diagnosis not present

## 2016-08-15 DIAGNOSIS — S81802D Unspecified open wound, left lower leg, subsequent encounter: Secondary | ICD-10-CM | POA: Diagnosis not present

## 2016-08-15 DIAGNOSIS — I509 Heart failure, unspecified: Secondary | ICD-10-CM | POA: Diagnosis not present

## 2016-08-15 DIAGNOSIS — E1122 Type 2 diabetes mellitus with diabetic chronic kidney disease: Secondary | ICD-10-CM | POA: Diagnosis not present

## 2016-08-15 DIAGNOSIS — F0390 Unspecified dementia without behavioral disturbance: Secondary | ICD-10-CM | POA: Diagnosis not present

## 2016-08-15 DIAGNOSIS — I4891 Unspecified atrial fibrillation: Secondary | ICD-10-CM | POA: Diagnosis not present

## 2016-08-15 DIAGNOSIS — S81002D Unspecified open wound, left knee, subsequent encounter: Secondary | ICD-10-CM | POA: Diagnosis not present

## 2016-08-15 DIAGNOSIS — J8410 Pulmonary fibrosis, unspecified: Secondary | ICD-10-CM | POA: Diagnosis not present

## 2016-08-16 DIAGNOSIS — S81802D Unspecified open wound, left lower leg, subsequent encounter: Secondary | ICD-10-CM | POA: Diagnosis not present

## 2016-08-16 DIAGNOSIS — S81002D Unspecified open wound, left knee, subsequent encounter: Secondary | ICD-10-CM | POA: Diagnosis not present

## 2016-08-20 DIAGNOSIS — N183 Chronic kidney disease, stage 3 (moderate): Secondary | ICD-10-CM | POA: Diagnosis not present

## 2016-08-20 DIAGNOSIS — I130 Hypertensive heart and chronic kidney disease with heart failure and stage 1 through stage 4 chronic kidney disease, or unspecified chronic kidney disease: Secondary | ICD-10-CM | POA: Diagnosis not present

## 2016-08-20 DIAGNOSIS — S81802D Unspecified open wound, left lower leg, subsequent encounter: Secondary | ICD-10-CM | POA: Diagnosis not present

## 2016-08-20 DIAGNOSIS — S81002D Unspecified open wound, left knee, subsequent encounter: Secondary | ICD-10-CM | POA: Diagnosis not present

## 2016-08-20 DIAGNOSIS — I4891 Unspecified atrial fibrillation: Secondary | ICD-10-CM | POA: Diagnosis not present

## 2016-08-20 DIAGNOSIS — E1122 Type 2 diabetes mellitus with diabetic chronic kidney disease: Secondary | ICD-10-CM | POA: Diagnosis not present

## 2016-08-20 DIAGNOSIS — I509 Heart failure, unspecified: Secondary | ICD-10-CM | POA: Diagnosis not present

## 2016-08-20 DIAGNOSIS — J8410 Pulmonary fibrosis, unspecified: Secondary | ICD-10-CM | POA: Diagnosis not present

## 2016-08-20 DIAGNOSIS — F0390 Unspecified dementia without behavioral disturbance: Secondary | ICD-10-CM | POA: Diagnosis not present

## 2016-08-20 NOTE — Telephone Encounter (Signed)
Received signed domestic return receipt verifying delivery of certified letter on August 08, 2016. Article number [id number] [phone] [phone]

## 2016-08-22 DIAGNOSIS — S81002D Unspecified open wound, left knee, subsequent encounter: Secondary | ICD-10-CM | POA: Diagnosis not present

## 2016-08-22 DIAGNOSIS — I130 Hypertensive heart and chronic kidney disease with heart failure and stage 1 through stage 4 chronic kidney disease, or unspecified chronic kidney disease: Secondary | ICD-10-CM | POA: Diagnosis not present

## 2016-08-22 DIAGNOSIS — F0390 Unspecified dementia without behavioral disturbance: Secondary | ICD-10-CM | POA: Diagnosis not present

## 2016-08-22 DIAGNOSIS — J8410 Pulmonary fibrosis, unspecified: Secondary | ICD-10-CM | POA: Diagnosis not present

## 2016-08-22 DIAGNOSIS — N183 Chronic kidney disease, stage 3 (moderate): Secondary | ICD-10-CM | POA: Diagnosis not present

## 2016-08-22 DIAGNOSIS — I4891 Unspecified atrial fibrillation: Secondary | ICD-10-CM | POA: Diagnosis not present

## 2016-08-22 DIAGNOSIS — I509 Heart failure, unspecified: Secondary | ICD-10-CM | POA: Diagnosis not present

## 2016-08-22 DIAGNOSIS — S81802D Unspecified open wound, left lower leg, subsequent encounter: Secondary | ICD-10-CM | POA: Diagnosis not present

## 2016-08-22 DIAGNOSIS — E1122 Type 2 diabetes mellitus with diabetic chronic kidney disease: Secondary | ICD-10-CM | POA: Diagnosis not present

## 2016-08-28 ENCOUNTER — Other Ambulatory Visit: Payer: Self-pay | Admitting: Student

## 2016-08-28 DIAGNOSIS — L97112 Non-pressure chronic ulcer of right thigh with fat layer exposed: Secondary | ICD-10-CM | POA: Diagnosis not present

## 2016-08-28 DIAGNOSIS — I509 Heart failure, unspecified: Secondary | ICD-10-CM | POA: Diagnosis not present

## 2016-08-28 DIAGNOSIS — F0390 Unspecified dementia without behavioral disturbance: Secondary | ICD-10-CM | POA: Diagnosis not present

## 2016-08-28 DIAGNOSIS — N183 Chronic kidney disease, stage 3 (moderate): Secondary | ICD-10-CM | POA: Diagnosis not present

## 2016-08-28 DIAGNOSIS — E1122 Type 2 diabetes mellitus with diabetic chronic kidney disease: Secondary | ICD-10-CM | POA: Diagnosis not present

## 2016-08-28 DIAGNOSIS — J8410 Pulmonary fibrosis, unspecified: Secondary | ICD-10-CM | POA: Diagnosis not present

## 2016-08-28 DIAGNOSIS — L97222 Non-pressure chronic ulcer of left calf with fat layer exposed: Secondary | ICD-10-CM | POA: Diagnosis not present

## 2016-08-28 DIAGNOSIS — I130 Hypertensive heart and chronic kidney disease with heart failure and stage 1 through stage 4 chronic kidney disease, or unspecified chronic kidney disease: Secondary | ICD-10-CM | POA: Diagnosis not present

## 2016-08-28 DIAGNOSIS — I872 Venous insufficiency (chronic) (peripheral): Secondary | ICD-10-CM | POA: Diagnosis not present

## 2016-08-30 DIAGNOSIS — J8410 Pulmonary fibrosis, unspecified: Secondary | ICD-10-CM | POA: Diagnosis not present

## 2016-08-30 DIAGNOSIS — N183 Chronic kidney disease, stage 3 (moderate): Secondary | ICD-10-CM | POA: Diagnosis not present

## 2016-08-30 DIAGNOSIS — I509 Heart failure, unspecified: Secondary | ICD-10-CM | POA: Diagnosis not present

## 2016-08-30 DIAGNOSIS — E1122 Type 2 diabetes mellitus with diabetic chronic kidney disease: Secondary | ICD-10-CM | POA: Diagnosis not present

## 2016-08-30 DIAGNOSIS — I872 Venous insufficiency (chronic) (peripheral): Secondary | ICD-10-CM | POA: Diagnosis not present

## 2016-08-30 DIAGNOSIS — F0390 Unspecified dementia without behavioral disturbance: Secondary | ICD-10-CM | POA: Diagnosis not present

## 2016-08-30 DIAGNOSIS — L97112 Non-pressure chronic ulcer of right thigh with fat layer exposed: Secondary | ICD-10-CM | POA: Diagnosis not present

## 2016-08-30 DIAGNOSIS — L97222 Non-pressure chronic ulcer of left calf with fat layer exposed: Secondary | ICD-10-CM | POA: Diagnosis not present

## 2016-08-30 DIAGNOSIS — I130 Hypertensive heart and chronic kidney disease with heart failure and stage 1 through stage 4 chronic kidney disease, or unspecified chronic kidney disease: Secondary | ICD-10-CM | POA: Diagnosis not present

## 2016-09-02 DIAGNOSIS — E1122 Type 2 diabetes mellitus with diabetic chronic kidney disease: Secondary | ICD-10-CM | POA: Diagnosis not present

## 2016-09-02 DIAGNOSIS — J8410 Pulmonary fibrosis, unspecified: Secondary | ICD-10-CM | POA: Diagnosis not present

## 2016-09-02 DIAGNOSIS — F0390 Unspecified dementia without behavioral disturbance: Secondary | ICD-10-CM | POA: Diagnosis not present

## 2016-09-02 DIAGNOSIS — N183 Chronic kidney disease, stage 3 (moderate): Secondary | ICD-10-CM | POA: Diagnosis not present

## 2016-09-02 DIAGNOSIS — I872 Venous insufficiency (chronic) (peripheral): Secondary | ICD-10-CM | POA: Diagnosis not present

## 2016-09-02 DIAGNOSIS — I509 Heart failure, unspecified: Secondary | ICD-10-CM | POA: Diagnosis not present

## 2016-09-02 DIAGNOSIS — L97222 Non-pressure chronic ulcer of left calf with fat layer exposed: Secondary | ICD-10-CM | POA: Diagnosis not present

## 2016-09-02 DIAGNOSIS — I130 Hypertensive heart and chronic kidney disease with heart failure and stage 1 through stage 4 chronic kidney disease, or unspecified chronic kidney disease: Secondary | ICD-10-CM | POA: Diagnosis not present

## 2016-09-02 DIAGNOSIS — L97112 Non-pressure chronic ulcer of right thigh with fat layer exposed: Secondary | ICD-10-CM | POA: Diagnosis not present

## 2016-09-05 DIAGNOSIS — L97112 Non-pressure chronic ulcer of right thigh with fat layer exposed: Secondary | ICD-10-CM | POA: Diagnosis not present

## 2016-09-05 DIAGNOSIS — I509 Heart failure, unspecified: Secondary | ICD-10-CM | POA: Diagnosis not present

## 2016-09-05 DIAGNOSIS — J8410 Pulmonary fibrosis, unspecified: Secondary | ICD-10-CM | POA: Diagnosis not present

## 2016-09-05 DIAGNOSIS — E1122 Type 2 diabetes mellitus with diabetic chronic kidney disease: Secondary | ICD-10-CM | POA: Diagnosis not present

## 2016-09-05 DIAGNOSIS — F0390 Unspecified dementia without behavioral disturbance: Secondary | ICD-10-CM | POA: Diagnosis not present

## 2016-09-05 DIAGNOSIS — I872 Venous insufficiency (chronic) (peripheral): Secondary | ICD-10-CM | POA: Diagnosis not present

## 2016-09-05 DIAGNOSIS — I130 Hypertensive heart and chronic kidney disease with heart failure and stage 1 through stage 4 chronic kidney disease, or unspecified chronic kidney disease: Secondary | ICD-10-CM | POA: Diagnosis not present

## 2016-09-05 DIAGNOSIS — N183 Chronic kidney disease, stage 3 (moderate): Secondary | ICD-10-CM | POA: Diagnosis not present

## 2016-09-05 DIAGNOSIS — L97222 Non-pressure chronic ulcer of left calf with fat layer exposed: Secondary | ICD-10-CM | POA: Diagnosis not present

## 2016-09-09 DIAGNOSIS — E1122 Type 2 diabetes mellitus with diabetic chronic kidney disease: Secondary | ICD-10-CM | POA: Diagnosis not present

## 2016-09-09 DIAGNOSIS — I130 Hypertensive heart and chronic kidney disease with heart failure and stage 1 through stage 4 chronic kidney disease, or unspecified chronic kidney disease: Secondary | ICD-10-CM | POA: Diagnosis not present

## 2016-09-09 DIAGNOSIS — I872 Venous insufficiency (chronic) (peripheral): Secondary | ICD-10-CM | POA: Diagnosis not present

## 2016-09-09 DIAGNOSIS — L97112 Non-pressure chronic ulcer of right thigh with fat layer exposed: Secondary | ICD-10-CM | POA: Diagnosis not present

## 2016-09-09 DIAGNOSIS — F0390 Unspecified dementia without behavioral disturbance: Secondary | ICD-10-CM | POA: Diagnosis not present

## 2016-09-09 DIAGNOSIS — J8410 Pulmonary fibrosis, unspecified: Secondary | ICD-10-CM | POA: Diagnosis not present

## 2016-09-09 DIAGNOSIS — N183 Chronic kidney disease, stage 3 (moderate): Secondary | ICD-10-CM | POA: Diagnosis not present

## 2016-09-09 DIAGNOSIS — I509 Heart failure, unspecified: Secondary | ICD-10-CM | POA: Diagnosis not present

## 2016-09-09 DIAGNOSIS — L97222 Non-pressure chronic ulcer of left calf with fat layer exposed: Secondary | ICD-10-CM | POA: Diagnosis not present

## 2016-09-10 DIAGNOSIS — R609 Edema, unspecified: Secondary | ICD-10-CM | POA: Diagnosis not present

## 2016-09-10 DIAGNOSIS — L97929 Non-pressure chronic ulcer of unspecified part of left lower leg with unspecified severity: Secondary | ICD-10-CM | POA: Diagnosis not present

## 2016-09-10 DIAGNOSIS — I83029 Varicose veins of left lower extremity with ulcer of unspecified site: Secondary | ICD-10-CM | POA: Diagnosis not present

## 2016-09-10 DIAGNOSIS — I739 Peripheral vascular disease, unspecified: Secondary | ICD-10-CM | POA: Diagnosis not present

## 2016-09-10 DIAGNOSIS — I872 Venous insufficiency (chronic) (peripheral): Secondary | ICD-10-CM | POA: Diagnosis not present

## 2016-09-12 DIAGNOSIS — L97222 Non-pressure chronic ulcer of left calf with fat layer exposed: Secondary | ICD-10-CM | POA: Diagnosis not present

## 2016-09-12 DIAGNOSIS — E1122 Type 2 diabetes mellitus with diabetic chronic kidney disease: Secondary | ICD-10-CM | POA: Diagnosis not present

## 2016-09-12 DIAGNOSIS — I130 Hypertensive heart and chronic kidney disease with heart failure and stage 1 through stage 4 chronic kidney disease, or unspecified chronic kidney disease: Secondary | ICD-10-CM | POA: Diagnosis not present

## 2016-09-12 DIAGNOSIS — I509 Heart failure, unspecified: Secondary | ICD-10-CM | POA: Diagnosis not present

## 2016-09-12 DIAGNOSIS — L97112 Non-pressure chronic ulcer of right thigh with fat layer exposed: Secondary | ICD-10-CM | POA: Diagnosis not present

## 2016-09-12 DIAGNOSIS — F0390 Unspecified dementia without behavioral disturbance: Secondary | ICD-10-CM | POA: Diagnosis not present

## 2016-09-12 DIAGNOSIS — J8410 Pulmonary fibrosis, unspecified: Secondary | ICD-10-CM | POA: Diagnosis not present

## 2016-09-12 DIAGNOSIS — N183 Chronic kidney disease, stage 3 (moderate): Secondary | ICD-10-CM | POA: Diagnosis not present

## 2016-09-12 DIAGNOSIS — I872 Venous insufficiency (chronic) (peripheral): Secondary | ICD-10-CM | POA: Diagnosis not present

## 2016-09-13 ENCOUNTER — Other Ambulatory Visit: Payer: Self-pay | Admitting: Family Medicine

## 2016-09-13 DIAGNOSIS — L97909 Non-pressure chronic ulcer of unspecified part of unspecified lower leg with unspecified severity: Secondary | ICD-10-CM | POA: Diagnosis not present

## 2016-09-13 DIAGNOSIS — E119 Type 2 diabetes mellitus without complications: Secondary | ICD-10-CM | POA: Diagnosis not present

## 2016-09-13 DIAGNOSIS — Z8673 Personal history of transient ischemic attack (TIA), and cerebral infarction without residual deficits: Secondary | ICD-10-CM | POA: Diagnosis not present

## 2016-09-13 DIAGNOSIS — I10 Essential (primary) hypertension: Secondary | ICD-10-CM | POA: Diagnosis not present

## 2016-09-13 DIAGNOSIS — F0390 Unspecified dementia without behavioral disturbance: Secondary | ICD-10-CM | POA: Diagnosis not present

## 2016-09-16 DIAGNOSIS — J8410 Pulmonary fibrosis, unspecified: Secondary | ICD-10-CM | POA: Diagnosis not present

## 2016-09-16 DIAGNOSIS — I130 Hypertensive heart and chronic kidney disease with heart failure and stage 1 through stage 4 chronic kidney disease, or unspecified chronic kidney disease: Secondary | ICD-10-CM | POA: Diagnosis not present

## 2016-09-16 DIAGNOSIS — F0390 Unspecified dementia without behavioral disturbance: Secondary | ICD-10-CM | POA: Diagnosis not present

## 2016-09-16 DIAGNOSIS — L97222 Non-pressure chronic ulcer of left calf with fat layer exposed: Secondary | ICD-10-CM | POA: Diagnosis not present

## 2016-09-16 DIAGNOSIS — N183 Chronic kidney disease, stage 3 (moderate): Secondary | ICD-10-CM | POA: Diagnosis not present

## 2016-09-16 DIAGNOSIS — I872 Venous insufficiency (chronic) (peripheral): Secondary | ICD-10-CM | POA: Diagnosis not present

## 2016-09-16 DIAGNOSIS — I509 Heart failure, unspecified: Secondary | ICD-10-CM | POA: Diagnosis not present

## 2016-09-16 DIAGNOSIS — L97112 Non-pressure chronic ulcer of right thigh with fat layer exposed: Secondary | ICD-10-CM | POA: Diagnosis not present

## 2016-09-16 DIAGNOSIS — E1122 Type 2 diabetes mellitus with diabetic chronic kidney disease: Secondary | ICD-10-CM | POA: Diagnosis not present

## 2016-09-17 DIAGNOSIS — L97222 Non-pressure chronic ulcer of left calf with fat layer exposed: Secondary | ICD-10-CM | POA: Diagnosis not present

## 2016-09-17 DIAGNOSIS — S81802D Unspecified open wound, left lower leg, subsequent encounter: Secondary | ICD-10-CM | POA: Diagnosis not present

## 2016-09-17 DIAGNOSIS — I872 Venous insufficiency (chronic) (peripheral): Secondary | ICD-10-CM | POA: Diagnosis not present

## 2016-09-17 DIAGNOSIS — F0390 Unspecified dementia without behavioral disturbance: Secondary | ICD-10-CM | POA: Diagnosis not present

## 2016-09-17 DIAGNOSIS — I130 Hypertensive heart and chronic kidney disease with heart failure and stage 1 through stage 4 chronic kidney disease, or unspecified chronic kidney disease: Secondary | ICD-10-CM | POA: Diagnosis not present

## 2016-09-17 DIAGNOSIS — L97112 Non-pressure chronic ulcer of right thigh with fat layer exposed: Secondary | ICD-10-CM | POA: Diagnosis not present

## 2016-09-17 DIAGNOSIS — S81002D Unspecified open wound, left knee, subsequent encounter: Secondary | ICD-10-CM | POA: Diagnosis not present

## 2016-09-17 DIAGNOSIS — N183 Chronic kidney disease, stage 3 (moderate): Secondary | ICD-10-CM | POA: Diagnosis not present

## 2016-09-17 DIAGNOSIS — J8410 Pulmonary fibrosis, unspecified: Secondary | ICD-10-CM | POA: Diagnosis not present

## 2016-09-17 DIAGNOSIS — E1122 Type 2 diabetes mellitus with diabetic chronic kidney disease: Secondary | ICD-10-CM | POA: Diagnosis not present

## 2016-09-17 DIAGNOSIS — I509 Heart failure, unspecified: Secondary | ICD-10-CM | POA: Diagnosis not present

## 2016-09-19 DIAGNOSIS — J8410 Pulmonary fibrosis, unspecified: Secondary | ICD-10-CM | POA: Diagnosis not present

## 2016-09-19 DIAGNOSIS — F0390 Unspecified dementia without behavioral disturbance: Secondary | ICD-10-CM | POA: Diagnosis not present

## 2016-09-19 DIAGNOSIS — L97112 Non-pressure chronic ulcer of right thigh with fat layer exposed: Secondary | ICD-10-CM | POA: Diagnosis not present

## 2016-09-19 DIAGNOSIS — I130 Hypertensive heart and chronic kidney disease with heart failure and stage 1 through stage 4 chronic kidney disease, or unspecified chronic kidney disease: Secondary | ICD-10-CM | POA: Diagnosis not present

## 2016-09-19 DIAGNOSIS — L97222 Non-pressure chronic ulcer of left calf with fat layer exposed: Secondary | ICD-10-CM | POA: Diagnosis not present

## 2016-09-19 DIAGNOSIS — I872 Venous insufficiency (chronic) (peripheral): Secondary | ICD-10-CM | POA: Diagnosis not present

## 2016-09-19 DIAGNOSIS — E1122 Type 2 diabetes mellitus with diabetic chronic kidney disease: Secondary | ICD-10-CM | POA: Diagnosis not present

## 2016-09-19 DIAGNOSIS — N183 Chronic kidney disease, stage 3 (moderate): Secondary | ICD-10-CM | POA: Diagnosis not present

## 2016-09-19 DIAGNOSIS — I509 Heart failure, unspecified: Secondary | ICD-10-CM | POA: Diagnosis not present

## 2016-09-23 ENCOUNTER — Telehealth: Payer: Self-pay | Admitting: Family Medicine

## 2016-09-23 DIAGNOSIS — I509 Heart failure, unspecified: Secondary | ICD-10-CM | POA: Diagnosis not present

## 2016-09-23 DIAGNOSIS — L97222 Non-pressure chronic ulcer of left calf with fat layer exposed: Secondary | ICD-10-CM | POA: Diagnosis not present

## 2016-09-23 DIAGNOSIS — F0390 Unspecified dementia without behavioral disturbance: Secondary | ICD-10-CM | POA: Diagnosis not present

## 2016-09-23 DIAGNOSIS — I130 Hypertensive heart and chronic kidney disease with heart failure and stage 1 through stage 4 chronic kidney disease, or unspecified chronic kidney disease: Secondary | ICD-10-CM | POA: Diagnosis not present

## 2016-09-23 DIAGNOSIS — J8410 Pulmonary fibrosis, unspecified: Secondary | ICD-10-CM | POA: Diagnosis not present

## 2016-09-23 DIAGNOSIS — E1122 Type 2 diabetes mellitus with diabetic chronic kidney disease: Secondary | ICD-10-CM | POA: Diagnosis not present

## 2016-09-23 DIAGNOSIS — L97112 Non-pressure chronic ulcer of right thigh with fat layer exposed: Secondary | ICD-10-CM | POA: Diagnosis not present

## 2016-09-23 DIAGNOSIS — I872 Venous insufficiency (chronic) (peripheral): Secondary | ICD-10-CM | POA: Diagnosis not present

## 2016-09-23 DIAGNOSIS — N183 Chronic kidney disease, stage 3 (moderate): Secondary | ICD-10-CM | POA: Diagnosis not present

## 2016-09-23 NOTE — Telephone Encounter (Signed)
Rep returned call, understands that pt. Is no longer seen at this office. He states that he does have another physician listed and he will call them for the orders.

## 2016-09-23 NOTE — Telephone Encounter (Signed)
Caller with Nanine Means asking for verbal orders to assist pt with PT for 2 x a wk for 3 wks.

## 2016-09-23 NOTE — Telephone Encounter (Signed)
Left message for Christina Pittman to call - Pt. No longer with our practice so he will need to get verbal orders from current PCP.

## 2016-09-26 DIAGNOSIS — J8410 Pulmonary fibrosis, unspecified: Secondary | ICD-10-CM | POA: Diagnosis not present

## 2016-09-26 DIAGNOSIS — I872 Venous insufficiency (chronic) (peripheral): Secondary | ICD-10-CM | POA: Diagnosis not present

## 2016-09-26 DIAGNOSIS — N183 Chronic kidney disease, stage 3 (moderate): Secondary | ICD-10-CM | POA: Diagnosis not present

## 2016-09-26 DIAGNOSIS — L97222 Non-pressure chronic ulcer of left calf with fat layer exposed: Secondary | ICD-10-CM | POA: Diagnosis not present

## 2016-09-26 DIAGNOSIS — L97112 Non-pressure chronic ulcer of right thigh with fat layer exposed: Secondary | ICD-10-CM | POA: Diagnosis not present

## 2016-09-26 DIAGNOSIS — F0390 Unspecified dementia without behavioral disturbance: Secondary | ICD-10-CM | POA: Diagnosis not present

## 2016-09-26 DIAGNOSIS — I130 Hypertensive heart and chronic kidney disease with heart failure and stage 1 through stage 4 chronic kidney disease, or unspecified chronic kidney disease: Secondary | ICD-10-CM | POA: Diagnosis not present

## 2016-09-26 DIAGNOSIS — E1122 Type 2 diabetes mellitus with diabetic chronic kidney disease: Secondary | ICD-10-CM | POA: Diagnosis not present

## 2016-09-26 DIAGNOSIS — I509 Heart failure, unspecified: Secondary | ICD-10-CM | POA: Diagnosis not present

## 2016-09-27 DIAGNOSIS — I130 Hypertensive heart and chronic kidney disease with heart failure and stage 1 through stage 4 chronic kidney disease, or unspecified chronic kidney disease: Secondary | ICD-10-CM | POA: Diagnosis not present

## 2016-09-27 DIAGNOSIS — N183 Chronic kidney disease, stage 3 (moderate): Secondary | ICD-10-CM | POA: Diagnosis not present

## 2016-09-27 DIAGNOSIS — F0390 Unspecified dementia without behavioral disturbance: Secondary | ICD-10-CM | POA: Diagnosis not present

## 2016-09-27 DIAGNOSIS — I509 Heart failure, unspecified: Secondary | ICD-10-CM | POA: Diagnosis not present

## 2016-09-27 DIAGNOSIS — J8410 Pulmonary fibrosis, unspecified: Secondary | ICD-10-CM | POA: Diagnosis not present

## 2016-09-27 DIAGNOSIS — E1122 Type 2 diabetes mellitus with diabetic chronic kidney disease: Secondary | ICD-10-CM | POA: Diagnosis not present

## 2016-09-27 DIAGNOSIS — L97222 Non-pressure chronic ulcer of left calf with fat layer exposed: Secondary | ICD-10-CM | POA: Diagnosis not present

## 2016-09-27 DIAGNOSIS — I872 Venous insufficiency (chronic) (peripheral): Secondary | ICD-10-CM | POA: Diagnosis not present

## 2016-09-27 DIAGNOSIS — L97112 Non-pressure chronic ulcer of right thigh with fat layer exposed: Secondary | ICD-10-CM | POA: Diagnosis not present

## 2016-09-28 DIAGNOSIS — F0390 Unspecified dementia without behavioral disturbance: Secondary | ICD-10-CM | POA: Diagnosis not present

## 2016-09-28 DIAGNOSIS — I130 Hypertensive heart and chronic kidney disease with heart failure and stage 1 through stage 4 chronic kidney disease, or unspecified chronic kidney disease: Secondary | ICD-10-CM | POA: Diagnosis not present

## 2016-09-28 DIAGNOSIS — J8410 Pulmonary fibrosis, unspecified: Secondary | ICD-10-CM | POA: Diagnosis not present

## 2016-09-28 DIAGNOSIS — I509 Heart failure, unspecified: Secondary | ICD-10-CM | POA: Diagnosis not present

## 2016-09-28 DIAGNOSIS — I872 Venous insufficiency (chronic) (peripheral): Secondary | ICD-10-CM | POA: Diagnosis not present

## 2016-09-28 DIAGNOSIS — L97112 Non-pressure chronic ulcer of right thigh with fat layer exposed: Secondary | ICD-10-CM | POA: Diagnosis not present

## 2016-09-28 DIAGNOSIS — L97222 Non-pressure chronic ulcer of left calf with fat layer exposed: Secondary | ICD-10-CM | POA: Diagnosis not present

## 2016-09-28 DIAGNOSIS — N183 Chronic kidney disease, stage 3 (moderate): Secondary | ICD-10-CM | POA: Diagnosis not present

## 2016-09-28 DIAGNOSIS — E1122 Type 2 diabetes mellitus with diabetic chronic kidney disease: Secondary | ICD-10-CM | POA: Diagnosis not present

## 2016-10-01 DIAGNOSIS — E1122 Type 2 diabetes mellitus with diabetic chronic kidney disease: Secondary | ICD-10-CM | POA: Diagnosis not present

## 2016-10-01 DIAGNOSIS — J8410 Pulmonary fibrosis, unspecified: Secondary | ICD-10-CM | POA: Diagnosis not present

## 2016-10-01 DIAGNOSIS — I509 Heart failure, unspecified: Secondary | ICD-10-CM | POA: Diagnosis not present

## 2016-10-01 DIAGNOSIS — L97112 Non-pressure chronic ulcer of right thigh with fat layer exposed: Secondary | ICD-10-CM | POA: Diagnosis not present

## 2016-10-01 DIAGNOSIS — F0390 Unspecified dementia without behavioral disturbance: Secondary | ICD-10-CM | POA: Diagnosis not present

## 2016-10-01 DIAGNOSIS — I872 Venous insufficiency (chronic) (peripheral): Secondary | ICD-10-CM | POA: Diagnosis not present

## 2016-10-01 DIAGNOSIS — I130 Hypertensive heart and chronic kidney disease with heart failure and stage 1 through stage 4 chronic kidney disease, or unspecified chronic kidney disease: Secondary | ICD-10-CM | POA: Diagnosis not present

## 2016-10-01 DIAGNOSIS — L97222 Non-pressure chronic ulcer of left calf with fat layer exposed: Secondary | ICD-10-CM | POA: Diagnosis not present

## 2016-10-01 DIAGNOSIS — N183 Chronic kidney disease, stage 3 (moderate): Secondary | ICD-10-CM | POA: Diagnosis not present

## 2016-10-02 ENCOUNTER — Telehealth: Payer: Self-pay | Admitting: Family Medicine

## 2016-10-02 DIAGNOSIS — I509 Heart failure, unspecified: Secondary | ICD-10-CM | POA: Diagnosis not present

## 2016-10-02 DIAGNOSIS — I872 Venous insufficiency (chronic) (peripheral): Secondary | ICD-10-CM | POA: Diagnosis not present

## 2016-10-02 DIAGNOSIS — F0390 Unspecified dementia without behavioral disturbance: Secondary | ICD-10-CM | POA: Diagnosis not present

## 2016-10-02 DIAGNOSIS — E1122 Type 2 diabetes mellitus with diabetic chronic kidney disease: Secondary | ICD-10-CM | POA: Diagnosis not present

## 2016-10-02 DIAGNOSIS — L97112 Non-pressure chronic ulcer of right thigh with fat layer exposed: Secondary | ICD-10-CM | POA: Diagnosis not present

## 2016-10-02 DIAGNOSIS — I130 Hypertensive heart and chronic kidney disease with heart failure and stage 1 through stage 4 chronic kidney disease, or unspecified chronic kidney disease: Secondary | ICD-10-CM | POA: Diagnosis not present

## 2016-10-02 DIAGNOSIS — N183 Chronic kidney disease, stage 3 (moderate): Secondary | ICD-10-CM | POA: Diagnosis not present

## 2016-10-02 DIAGNOSIS — J8410 Pulmonary fibrosis, unspecified: Secondary | ICD-10-CM | POA: Diagnosis not present

## 2016-10-02 DIAGNOSIS — L97222 Non-pressure chronic ulcer of left calf with fat layer exposed: Secondary | ICD-10-CM | POA: Diagnosis not present

## 2016-10-02 NOTE — Telephone Encounter (Signed)
Pt is no longer under our care as family indicated they were finding a new MD

## 2016-10-02 NOTE — Telephone Encounter (Signed)
Called and left a detailed message to inform of PCP notification.

## 2016-10-02 NOTE — Telephone Encounter (Signed)
Caller with Nanine Means is asking for as call back regarding pt wanting a hoyer lift to assist with in home care.

## 2016-10-02 NOTE — Telephone Encounter (Signed)
Since pt is no longer under our care do I decline?

## 2016-10-03 DIAGNOSIS — J8410 Pulmonary fibrosis, unspecified: Secondary | ICD-10-CM | POA: Diagnosis not present

## 2016-10-03 DIAGNOSIS — F0390 Unspecified dementia without behavioral disturbance: Secondary | ICD-10-CM | POA: Diagnosis not present

## 2016-10-03 DIAGNOSIS — E1122 Type 2 diabetes mellitus with diabetic chronic kidney disease: Secondary | ICD-10-CM | POA: Diagnosis not present

## 2016-10-03 DIAGNOSIS — L97222 Non-pressure chronic ulcer of left calf with fat layer exposed: Secondary | ICD-10-CM | POA: Diagnosis not present

## 2016-10-03 DIAGNOSIS — N183 Chronic kidney disease, stage 3 (moderate): Secondary | ICD-10-CM | POA: Diagnosis not present

## 2016-10-03 DIAGNOSIS — I130 Hypertensive heart and chronic kidney disease with heart failure and stage 1 through stage 4 chronic kidney disease, or unspecified chronic kidney disease: Secondary | ICD-10-CM | POA: Diagnosis not present

## 2016-10-03 DIAGNOSIS — I509 Heart failure, unspecified: Secondary | ICD-10-CM | POA: Diagnosis not present

## 2016-10-03 DIAGNOSIS — I872 Venous insufficiency (chronic) (peripheral): Secondary | ICD-10-CM | POA: Diagnosis not present

## 2016-10-03 DIAGNOSIS — L97112 Non-pressure chronic ulcer of right thigh with fat layer exposed: Secondary | ICD-10-CM | POA: Diagnosis not present

## 2016-10-04 DIAGNOSIS — L97222 Non-pressure chronic ulcer of left calf with fat layer exposed: Secondary | ICD-10-CM | POA: Diagnosis not present

## 2016-10-04 DIAGNOSIS — L97112 Non-pressure chronic ulcer of right thigh with fat layer exposed: Secondary | ICD-10-CM | POA: Diagnosis not present

## 2016-10-04 DIAGNOSIS — I130 Hypertensive heart and chronic kidney disease with heart failure and stage 1 through stage 4 chronic kidney disease, or unspecified chronic kidney disease: Secondary | ICD-10-CM | POA: Diagnosis not present

## 2016-10-04 DIAGNOSIS — I509 Heart failure, unspecified: Secondary | ICD-10-CM | POA: Diagnosis not present

## 2016-10-04 DIAGNOSIS — E1122 Type 2 diabetes mellitus with diabetic chronic kidney disease: Secondary | ICD-10-CM | POA: Diagnosis not present

## 2016-10-04 DIAGNOSIS — F0390 Unspecified dementia without behavioral disturbance: Secondary | ICD-10-CM | POA: Diagnosis not present

## 2016-10-04 DIAGNOSIS — N183 Chronic kidney disease, stage 3 (moderate): Secondary | ICD-10-CM | POA: Diagnosis not present

## 2016-10-04 DIAGNOSIS — I872 Venous insufficiency (chronic) (peripheral): Secondary | ICD-10-CM | POA: Diagnosis not present

## 2016-10-04 DIAGNOSIS — J8410 Pulmonary fibrosis, unspecified: Secondary | ICD-10-CM | POA: Diagnosis not present

## 2016-10-07 DIAGNOSIS — L97222 Non-pressure chronic ulcer of left calf with fat layer exposed: Secondary | ICD-10-CM | POA: Diagnosis not present

## 2016-10-07 DIAGNOSIS — J8410 Pulmonary fibrosis, unspecified: Secondary | ICD-10-CM | POA: Diagnosis not present

## 2016-10-07 DIAGNOSIS — F0390 Unspecified dementia without behavioral disturbance: Secondary | ICD-10-CM | POA: Diagnosis not present

## 2016-10-07 DIAGNOSIS — L97112 Non-pressure chronic ulcer of right thigh with fat layer exposed: Secondary | ICD-10-CM | POA: Diagnosis not present

## 2016-10-07 DIAGNOSIS — I509 Heart failure, unspecified: Secondary | ICD-10-CM | POA: Diagnosis not present

## 2016-10-07 DIAGNOSIS — E1122 Type 2 diabetes mellitus with diabetic chronic kidney disease: Secondary | ICD-10-CM | POA: Diagnosis not present

## 2016-10-07 DIAGNOSIS — I872 Venous insufficiency (chronic) (peripheral): Secondary | ICD-10-CM | POA: Diagnosis not present

## 2016-10-07 DIAGNOSIS — N183 Chronic kidney disease, stage 3 (moderate): Secondary | ICD-10-CM | POA: Diagnosis not present

## 2016-10-07 DIAGNOSIS — I130 Hypertensive heart and chronic kidney disease with heart failure and stage 1 through stage 4 chronic kidney disease, or unspecified chronic kidney disease: Secondary | ICD-10-CM | POA: Diagnosis not present

## 2016-10-09 ENCOUNTER — Other Ambulatory Visit: Payer: Self-pay | Admitting: Student

## 2016-10-09 DIAGNOSIS — L97112 Non-pressure chronic ulcer of right thigh with fat layer exposed: Secondary | ICD-10-CM | POA: Diagnosis not present

## 2016-10-09 DIAGNOSIS — I509 Heart failure, unspecified: Secondary | ICD-10-CM | POA: Diagnosis not present

## 2016-10-09 DIAGNOSIS — F0390 Unspecified dementia without behavioral disturbance: Secondary | ICD-10-CM | POA: Diagnosis not present

## 2016-10-09 DIAGNOSIS — I872 Venous insufficiency (chronic) (peripheral): Secondary | ICD-10-CM | POA: Diagnosis not present

## 2016-10-09 DIAGNOSIS — I130 Hypertensive heart and chronic kidney disease with heart failure and stage 1 through stage 4 chronic kidney disease, or unspecified chronic kidney disease: Secondary | ICD-10-CM | POA: Diagnosis not present

## 2016-10-09 DIAGNOSIS — N183 Chronic kidney disease, stage 3 (moderate): Secondary | ICD-10-CM | POA: Diagnosis not present

## 2016-10-09 DIAGNOSIS — J8410 Pulmonary fibrosis, unspecified: Secondary | ICD-10-CM | POA: Diagnosis not present

## 2016-10-09 DIAGNOSIS — L97222 Non-pressure chronic ulcer of left calf with fat layer exposed: Secondary | ICD-10-CM | POA: Diagnosis not present

## 2016-10-09 DIAGNOSIS — E1122 Type 2 diabetes mellitus with diabetic chronic kidney disease: Secondary | ICD-10-CM | POA: Diagnosis not present

## 2016-10-10 DIAGNOSIS — I10 Essential (primary) hypertension: Secondary | ICD-10-CM | POA: Diagnosis not present

## 2016-10-10 DIAGNOSIS — L97222 Non-pressure chronic ulcer of left calf with fat layer exposed: Secondary | ICD-10-CM | POA: Diagnosis not present

## 2016-10-10 DIAGNOSIS — I130 Hypertensive heart and chronic kidney disease with heart failure and stage 1 through stage 4 chronic kidney disease, or unspecified chronic kidney disease: Secondary | ICD-10-CM | POA: Diagnosis not present

## 2016-10-10 DIAGNOSIS — J8410 Pulmonary fibrosis, unspecified: Secondary | ICD-10-CM | POA: Diagnosis not present

## 2016-10-10 DIAGNOSIS — S81802D Unspecified open wound, left lower leg, subsequent encounter: Secondary | ICD-10-CM | POA: Diagnosis not present

## 2016-10-10 DIAGNOSIS — L97909 Non-pressure chronic ulcer of unspecified part of unspecified lower leg with unspecified severity: Secondary | ICD-10-CM | POA: Diagnosis not present

## 2016-10-10 DIAGNOSIS — I872 Venous insufficiency (chronic) (peripheral): Secondary | ICD-10-CM | POA: Diagnosis not present

## 2016-10-10 DIAGNOSIS — E1122 Type 2 diabetes mellitus with diabetic chronic kidney disease: Secondary | ICD-10-CM | POA: Diagnosis not present

## 2016-10-10 DIAGNOSIS — Z8673 Personal history of transient ischemic attack (TIA), and cerebral infarction without residual deficits: Secondary | ICD-10-CM | POA: Diagnosis not present

## 2016-10-10 DIAGNOSIS — I509 Heart failure, unspecified: Secondary | ICD-10-CM | POA: Diagnosis not present

## 2016-10-10 DIAGNOSIS — S81002D Unspecified open wound, left knee, subsequent encounter: Secondary | ICD-10-CM | POA: Diagnosis not present

## 2016-10-10 DIAGNOSIS — F0390 Unspecified dementia without behavioral disturbance: Secondary | ICD-10-CM | POA: Diagnosis not present

## 2016-10-10 DIAGNOSIS — E119 Type 2 diabetes mellitus without complications: Secondary | ICD-10-CM | POA: Diagnosis not present

## 2016-10-10 DIAGNOSIS — N183 Chronic kidney disease, stage 3 (moderate): Secondary | ICD-10-CM | POA: Diagnosis not present

## 2016-10-10 DIAGNOSIS — L97112 Non-pressure chronic ulcer of right thigh with fat layer exposed: Secondary | ICD-10-CM | POA: Diagnosis not present

## 2016-10-10 NOTE — Telephone Encounter (Signed)
°*  STAT* If patient is at the pharmacy, call can be transferred to refill team.   1. Which medications need to be refilled? (please list name of each medication and dose if known) Eliquis 5 mg    2. Which pharmacy/location (including street and city if local pharmacy) is medication to be sent to?[facility] on [address]   3. Do they need a 30 day or 90 day supply? [place]

## 2016-10-11 ENCOUNTER — Other Ambulatory Visit: Payer: Self-pay | Admitting: Pharmacist Clinician (PhC)/ Clinical Pharmacy Specialist

## 2016-10-11 DIAGNOSIS — L97222 Non-pressure chronic ulcer of left calf with fat layer exposed: Secondary | ICD-10-CM | POA: Diagnosis not present

## 2016-10-11 DIAGNOSIS — I130 Hypertensive heart and chronic kidney disease with heart failure and stage 1 through stage 4 chronic kidney disease, or unspecified chronic kidney disease: Secondary | ICD-10-CM | POA: Diagnosis not present

## 2016-10-11 DIAGNOSIS — F0390 Unspecified dementia without behavioral disturbance: Secondary | ICD-10-CM | POA: Diagnosis not present

## 2016-10-11 DIAGNOSIS — L97112 Non-pressure chronic ulcer of right thigh with fat layer exposed: Secondary | ICD-10-CM | POA: Diagnosis not present

## 2016-10-11 DIAGNOSIS — I872 Venous insufficiency (chronic) (peripheral): Secondary | ICD-10-CM | POA: Diagnosis not present

## 2016-10-11 DIAGNOSIS — I509 Heart failure, unspecified: Secondary | ICD-10-CM | POA: Diagnosis not present

## 2016-10-11 DIAGNOSIS — E1122 Type 2 diabetes mellitus with diabetic chronic kidney disease: Secondary | ICD-10-CM | POA: Diagnosis not present

## 2016-10-11 DIAGNOSIS — N183 Chronic kidney disease, stage 3 (moderate): Secondary | ICD-10-CM | POA: Diagnosis not present

## 2016-10-11 DIAGNOSIS — J8410 Pulmonary fibrosis, unspecified: Secondary | ICD-10-CM | POA: Diagnosis not present

## 2016-10-11 MED ORDER — APIXABAN 5 MG PO TABS: 5.0000 mg | ORAL_TABLET | Freq: Two times a day (BID) | ORAL | 1 refills | Status: DC

## 2016-10-14 DIAGNOSIS — S81802D Unspecified open wound, left lower leg, subsequent encounter: Secondary | ICD-10-CM | POA: Diagnosis not present

## 2016-10-14 DIAGNOSIS — S81002D Unspecified open wound, left knee, subsequent encounter: Secondary | ICD-10-CM | POA: Diagnosis not present

## 2016-10-15 DIAGNOSIS — I130 Hypertensive heart and chronic kidney disease with heart failure and stage 1 through stage 4 chronic kidney disease, or unspecified chronic kidney disease: Secondary | ICD-10-CM | POA: Diagnosis not present

## 2016-10-15 DIAGNOSIS — L97222 Non-pressure chronic ulcer of left calf with fat layer exposed: Secondary | ICD-10-CM | POA: Diagnosis not present

## 2016-10-15 DIAGNOSIS — I872 Venous insufficiency (chronic) (peripheral): Secondary | ICD-10-CM | POA: Diagnosis not present

## 2016-10-15 DIAGNOSIS — E1122 Type 2 diabetes mellitus with diabetic chronic kidney disease: Secondary | ICD-10-CM | POA: Diagnosis not present

## 2016-10-15 DIAGNOSIS — I509 Heart failure, unspecified: Secondary | ICD-10-CM | POA: Diagnosis not present

## 2016-10-15 DIAGNOSIS — L97112 Non-pressure chronic ulcer of right thigh with fat layer exposed: Secondary | ICD-10-CM | POA: Diagnosis not present

## 2016-10-15 DIAGNOSIS — J8410 Pulmonary fibrosis, unspecified: Secondary | ICD-10-CM | POA: Diagnosis not present

## 2016-10-15 DIAGNOSIS — N183 Chronic kidney disease, stage 3 (moderate): Secondary | ICD-10-CM | POA: Diagnosis not present

## 2016-10-15 DIAGNOSIS — F0390 Unspecified dementia without behavioral disturbance: Secondary | ICD-10-CM | POA: Diagnosis not present

## 2016-10-17 DIAGNOSIS — I509 Heart failure, unspecified: Secondary | ICD-10-CM | POA: Diagnosis not present

## 2016-10-17 DIAGNOSIS — J8410 Pulmonary fibrosis, unspecified: Secondary | ICD-10-CM | POA: Diagnosis not present

## 2016-10-17 DIAGNOSIS — I130 Hypertensive heart and chronic kidney disease with heart failure and stage 1 through stage 4 chronic kidney disease, or unspecified chronic kidney disease: Secondary | ICD-10-CM | POA: Diagnosis not present

## 2016-10-17 DIAGNOSIS — E1122 Type 2 diabetes mellitus with diabetic chronic kidney disease: Secondary | ICD-10-CM | POA: Diagnosis not present

## 2016-10-17 DIAGNOSIS — F0390 Unspecified dementia without behavioral disturbance: Secondary | ICD-10-CM | POA: Diagnosis not present

## 2016-10-17 DIAGNOSIS — L97112 Non-pressure chronic ulcer of right thigh with fat layer exposed: Secondary | ICD-10-CM | POA: Diagnosis not present

## 2016-10-17 DIAGNOSIS — L97222 Non-pressure chronic ulcer of left calf with fat layer exposed: Secondary | ICD-10-CM | POA: Diagnosis not present

## 2016-10-17 DIAGNOSIS — I872 Venous insufficiency (chronic) (peripheral): Secondary | ICD-10-CM | POA: Diagnosis not present

## 2016-10-17 DIAGNOSIS — N183 Chronic kidney disease, stage 3 (moderate): Secondary | ICD-10-CM | POA: Diagnosis not present

## 2016-10-20 ENCOUNTER — Other Ambulatory Visit (HOSPITAL_COMMUNITY)
Admission: RE | Admit: 2016-10-20 | Discharge: 2016-10-20 | Disposition: A | Discharge status: Home / Self Care | Payer: Medicare HMO | Source: Other Acute Inpatient Hospital | Attending: Internal Medicine | Admitting: Internal Medicine

## 2016-10-20 DIAGNOSIS — L97222 Non-pressure chronic ulcer of left calf with fat layer exposed: Secondary | ICD-10-CM | POA: Diagnosis not present

## 2016-10-20 DIAGNOSIS — I509 Heart failure, unspecified: Secondary | ICD-10-CM | POA: Diagnosis not present

## 2016-10-20 DIAGNOSIS — J8410 Pulmonary fibrosis, unspecified: Secondary | ICD-10-CM | POA: Diagnosis not present

## 2016-10-20 DIAGNOSIS — N183 Chronic kidney disease, stage 3 (moderate): Secondary | ICD-10-CM | POA: Diagnosis not present

## 2016-10-20 DIAGNOSIS — E1122 Type 2 diabetes mellitus with diabetic chronic kidney disease: Secondary | ICD-10-CM | POA: Diagnosis not present

## 2016-10-20 DIAGNOSIS — N390 Urinary tract infection, site not specified: Secondary | ICD-10-CM | POA: Insufficient documentation

## 2016-10-20 DIAGNOSIS — I130 Hypertensive heart and chronic kidney disease with heart failure and stage 1 through stage 4 chronic kidney disease, or unspecified chronic kidney disease: Secondary | ICD-10-CM | POA: Diagnosis not present

## 2016-10-20 DIAGNOSIS — F0390 Unspecified dementia without behavioral disturbance: Secondary | ICD-10-CM | POA: Diagnosis not present

## 2016-10-20 DIAGNOSIS — L97112 Non-pressure chronic ulcer of right thigh with fat layer exposed: Secondary | ICD-10-CM | POA: Diagnosis not present

## 2016-10-20 DIAGNOSIS — I872 Venous insufficiency (chronic) (peripheral): Secondary | ICD-10-CM | POA: Diagnosis not present

## 2016-10-20 LAB — URINALYSIS, ROUTINE W REFLEX MICROSCOPIC
Bilirubin Urine: NEGATIVE
Glucose, UA: NEGATIVE mg/dL
Nitrite: NEGATIVE
PH: 8.5 — AB (ref 5.0–8.0)
Protein, ur: 100 mg/dL — AB
SPECIFIC GRAVITY, URINE: 1.02 (ref 1.005–1.030)

## 2016-10-20 LAB — URINALYSIS, MICROSCOPIC (REFLEX): RBC / HPF: NONE SEEN RBC/hpf (ref 0–5)

## 2016-10-22 LAB — URINE CULTURE

## 2016-10-23 DIAGNOSIS — F0390 Unspecified dementia without behavioral disturbance: Secondary | ICD-10-CM | POA: Diagnosis not present

## 2016-10-23 DIAGNOSIS — I509 Heart failure, unspecified: Secondary | ICD-10-CM | POA: Diagnosis not present

## 2016-10-23 DIAGNOSIS — I130 Hypertensive heart and chronic kidney disease with heart failure and stage 1 through stage 4 chronic kidney disease, or unspecified chronic kidney disease: Secondary | ICD-10-CM | POA: Diagnosis not present

## 2016-10-23 DIAGNOSIS — L97222 Non-pressure chronic ulcer of left calf with fat layer exposed: Secondary | ICD-10-CM | POA: Diagnosis not present

## 2016-10-23 DIAGNOSIS — I872 Venous insufficiency (chronic) (peripheral): Secondary | ICD-10-CM | POA: Diagnosis not present

## 2016-10-23 DIAGNOSIS — N183 Chronic kidney disease, stage 3 (moderate): Secondary | ICD-10-CM | POA: Diagnosis not present

## 2016-10-23 DIAGNOSIS — L97112 Non-pressure chronic ulcer of right thigh with fat layer exposed: Secondary | ICD-10-CM | POA: Diagnosis not present

## 2016-10-23 DIAGNOSIS — E1122 Type 2 diabetes mellitus with diabetic chronic kidney disease: Secondary | ICD-10-CM | POA: Diagnosis not present

## 2016-10-23 DIAGNOSIS — J8410 Pulmonary fibrosis, unspecified: Secondary | ICD-10-CM | POA: Diagnosis not present

## 2016-10-28 DIAGNOSIS — I509 Heart failure, unspecified: Secondary | ICD-10-CM | POA: Diagnosis not present

## 2016-10-28 DIAGNOSIS — E1122 Type 2 diabetes mellitus with diabetic chronic kidney disease: Secondary | ICD-10-CM | POA: Diagnosis not present

## 2016-10-28 DIAGNOSIS — I130 Hypertensive heart and chronic kidney disease with heart failure and stage 1 through stage 4 chronic kidney disease, or unspecified chronic kidney disease: Secondary | ICD-10-CM | POA: Diagnosis not present

## 2016-10-28 DIAGNOSIS — L97112 Non-pressure chronic ulcer of right thigh with fat layer exposed: Secondary | ICD-10-CM | POA: Diagnosis not present

## 2016-10-28 DIAGNOSIS — F0390 Unspecified dementia without behavioral disturbance: Secondary | ICD-10-CM | POA: Diagnosis not present

## 2016-10-28 DIAGNOSIS — I872 Venous insufficiency (chronic) (peripheral): Secondary | ICD-10-CM | POA: Diagnosis not present

## 2016-10-28 DIAGNOSIS — J8410 Pulmonary fibrosis, unspecified: Secondary | ICD-10-CM | POA: Diagnosis not present

## 2016-10-28 DIAGNOSIS — L97222 Non-pressure chronic ulcer of left calf with fat layer exposed: Secondary | ICD-10-CM | POA: Diagnosis not present

## 2016-10-28 DIAGNOSIS — N183 Chronic kidney disease, stage 3 (moderate): Secondary | ICD-10-CM | POA: Diagnosis not present

## 2016-10-30 DIAGNOSIS — L97112 Non-pressure chronic ulcer of right thigh with fat layer exposed: Secondary | ICD-10-CM | POA: Diagnosis not present

## 2016-10-30 DIAGNOSIS — I509 Heart failure, unspecified: Secondary | ICD-10-CM | POA: Diagnosis not present

## 2016-10-30 DIAGNOSIS — L97222 Non-pressure chronic ulcer of left calf with fat layer exposed: Secondary | ICD-10-CM | POA: Diagnosis not present

## 2016-10-30 DIAGNOSIS — N183 Chronic kidney disease, stage 3 (moderate): Secondary | ICD-10-CM | POA: Diagnosis not present

## 2016-10-31 DIAGNOSIS — E1122 Type 2 diabetes mellitus with diabetic chronic kidney disease: Secondary | ICD-10-CM | POA: Diagnosis not present

## 2016-10-31 DIAGNOSIS — N183 Chronic kidney disease, stage 3 (moderate): Secondary | ICD-10-CM | POA: Diagnosis not present

## 2016-10-31 DIAGNOSIS — F0390 Unspecified dementia without behavioral disturbance: Secondary | ICD-10-CM | POA: Diagnosis not present

## 2016-10-31 DIAGNOSIS — J8410 Pulmonary fibrosis, unspecified: Secondary | ICD-10-CM | POA: Diagnosis not present

## 2016-10-31 DIAGNOSIS — I509 Heart failure, unspecified: Secondary | ICD-10-CM | POA: Diagnosis not present

## 2016-10-31 DIAGNOSIS — I130 Hypertensive heart and chronic kidney disease with heart failure and stage 1 through stage 4 chronic kidney disease, or unspecified chronic kidney disease: Secondary | ICD-10-CM | POA: Diagnosis not present

## 2016-10-31 DIAGNOSIS — I872 Venous insufficiency (chronic) (peripheral): Secondary | ICD-10-CM | POA: Diagnosis not present

## 2016-10-31 DIAGNOSIS — L97222 Non-pressure chronic ulcer of left calf with fat layer exposed: Secondary | ICD-10-CM | POA: Diagnosis not present

## 2016-10-31 DIAGNOSIS — L97112 Non-pressure chronic ulcer of right thigh with fat layer exposed: Secondary | ICD-10-CM | POA: Diagnosis not present

## 2016-11-01 DIAGNOSIS — F0390 Unspecified dementia without behavioral disturbance: Secondary | ICD-10-CM | POA: Diagnosis not present

## 2016-11-01 DIAGNOSIS — L97222 Non-pressure chronic ulcer of left calf with fat layer exposed: Secondary | ICD-10-CM | POA: Diagnosis not present

## 2016-11-01 DIAGNOSIS — I872 Venous insufficiency (chronic) (peripheral): Secondary | ICD-10-CM | POA: Diagnosis not present

## 2016-11-01 DIAGNOSIS — I130 Hypertensive heart and chronic kidney disease with heart failure and stage 1 through stage 4 chronic kidney disease, or unspecified chronic kidney disease: Secondary | ICD-10-CM | POA: Diagnosis not present

## 2016-11-01 DIAGNOSIS — N183 Chronic kidney disease, stage 3 (moderate): Secondary | ICD-10-CM | POA: Diagnosis not present

## 2016-11-01 DIAGNOSIS — I509 Heart failure, unspecified: Secondary | ICD-10-CM | POA: Diagnosis not present

## 2016-11-01 DIAGNOSIS — J8410 Pulmonary fibrosis, unspecified: Secondary | ICD-10-CM | POA: Diagnosis not present

## 2016-11-01 DIAGNOSIS — L97112 Non-pressure chronic ulcer of right thigh with fat layer exposed: Secondary | ICD-10-CM | POA: Diagnosis not present

## 2016-11-01 DIAGNOSIS — E1122 Type 2 diabetes mellitus with diabetic chronic kidney disease: Secondary | ICD-10-CM | POA: Diagnosis not present

## 2016-11-03 NOTE — Progress Notes (Signed)
Cardiology Office Note    Date:  11/04/2016   ID:  [name], DOB 01/15/1920, MRN [id number]  PCP:  No primary care provider on file.  Cardiologist: Dr. Debara Pickett   Chief Complaint  Patient presents with  . Follow-up    6 months    History of Present Illness:    Christina Pittman is a 83 y.o. female with past medical history of PAD (s/p R AKA), moderate to severe AS (by echo in 03/2011), PAF (on Eliquis), chronic diastolic CHF, HTN, HLD, and prior CVA in 2013 who presents to the office today for 73-month follow-up.  She was last examined by myself in 01/2016 and reported doing well from a cardiac perspective at that time. She was not active at baseline and reported sitting most of the day and using a wheelchair for ambulation. Her moderate to severe AS was addressed with the patient and her family and they favored conservative measures at that time which was reasonable in the setting of her advanced age.   In talking with the patient today, she reports overall doing well since her last visit. She does not elaborate much on her history as most information is obtained by her son who is present. She denies any recent chest discomfort, palpitations, dyspnea on exertion, orthopnea, or PND. She does have chronic lower extremity edema along her left leg which she says is worse after consuming salt. Her son reports she consumes ham, bacon, and sausage on a regular basis and he has difficulty limiting her intake of this as she is 83 years old. She is mostly non-ambulatory but can stand to pivot from her wheelchair to the recliner. She does not weigh regularly due to the difficulty with standing.  They did report she has been treated for recurrent urinary tract infections and just finished a course of antibiotics last week.  Her son reports they have hired 24/7 caregivers and he lives less than 1 mile away and sees her on a daily basis. She does have a new PCP (Dr. Daphene Jaeger [phone]) who performs house  calls and sees her once per month.    Past Medical History:  Diagnosis Date  . Aneurysm of thoracic aorta ([place])   . Aortic stenosis    a. moderate to severe by echo in 03/2011. Vmax 0.78cm^2  . Arthritis   . Aspiration pneumonia ([place])   . Bilateral venous insufficiency   . Chicken pox   . Diabetes mellitus   . Dysphagia   . Fractured pelvis ([place]) 05/14/2011  . Gangrene of foot ([place])   . Heart murmur   . Hemiparesis, right ([place])   . Hyperlipemia   . Hypertension   . Insomnia   . Pituitary macroadenoma ([place])   . Stroke [facility])    feb 2013    Past Surgical History:  Procedure Laterality Date  . ABDOMINAL HYSTERECTOMY    . ABOVE KNEE LEG AMPUTATION      Current Medications: Outpatient Medications Prior to Visit  Medication Sig Dispense Refill  . apixaban (ELIQUIS) 5 MG TABS tablet Take 1 tablet (5 mg total) by mouth 2 (two) times daily. 30 tablet 1  . aspirin EC 81 MG tablet Take 81 mg by mouth daily.    . furosemide (LASIX) 20 MG tablet TAKE ONE TABLET BY MOUTH TWICE DAILY AS NEEDED FOR  SWELLING 180 tablet 0  . NONFORMULARY OR COMPOUNDED ITEM Pt is in need of a Right AK Socket for her prosthetic. 1 each 0  .  ondansetron (ZOFRAN ODT) 4 MG disintegrating tablet 4mg  ODT q4 hours prn nausea/vomit 10 tablet 0  . potassium chloride SA (K-DUR,KLOR-CON) 20 MEQ tablet Take 1 tablet (20 mEq total) by mouth daily. 90 tablet 2  . simvastatin (ZOCOR) 20 MG tablet TAKE ONE TABLET BY MOUTH ONCE DAILY AT 6PM 90 tablet 2  . triamcinolone ointment (KENALOG) 0.1 % Apply 1 application topically 2 (two) times daily. 90 g 1  . carvedilol (COREG) 12.5 MG tablet Take 1 tablet (12.5 mg total) by mouth 2 (two) times daily. 180 tablet 2   No facility-administered medications prior to visit.      Allergies:   Betadine [povidone iodine]; Equalyte; Neosporin [neomycin-polymyxin-gramicidin]; and Codeine   Social History   Social History  . Marital status: Widowed    Spouse name: N/A  . Number of  children: 3  . Years of education: 9th   Occupational History  . farmer    Social History Main Topics  . Smoking status: Never Smoker  . Smokeless tobacco: Current User    Types: Snuff  . Alcohol use No  . Drug use: No  . Sexual activity: No   Other Topics Concern  . Not on file   Social History Narrative   epworth sleepiness scale = 16 (03/27/2015)     Family History:  The patient's family history includes Arthritis in her mother; Cancer in her father; Clotting disorder in her sister; Diabetes in her brother and sister; Heart attack in her brother; Heart disease in her brother; Liver disease in her brother.   Review of Systems:   Please see the history of present illness.     General:  No chills, fever, night sweats or weight changes.  Cardiovascular:  No chest pain, dyspnea on exertion, orthopnea, palpitations, paroxysmal nocturnal dyspnea. Positive for lower extremity edema.  Dermatological: No rash, lesions/masses Respiratory: No cough, dyspnea Urologic: No hematuria, Positive for dysuria (now resolved).  Abdominal:   No nausea, vomiting, diarrhea, bright red blood per rectum, melena, or hematemesis Neurologic:  No visual changes, wkns, changes in mental status. All other systems reviewed and are otherwise negative except as noted above.   Physical Exam:    VS:  BP 105/68   Pulse 64    General: Well developed, elderly Caucasian female appearing in no acute distress. Sitting in wheelchair. Head: Normocephalic, atraumatic, sclera non-icteric, no xanthomas, nares are without discharge.  Neck: No carotid bruits. JVD not elevated.  Lungs: Respirations regular and unlabored, without wheezes or rales.  Heart: Irregularly irregular. No S3 or S4.  No rubs or gallops appreciated. 3/6 SEM best appreciated along RUSB.  Abdomen: Soft, non-tender, non-distended with normoactive bowel sounds. No hepatomegaly. No rebound/guarding. No obvious abdominal masses. Msk:  Strength and tone  appear normal for age. No joint deformities or effusions. Extremities: No clubbing or cyanosis. 1+ pitting edema along LLE with 1+ distal pulses along LLE. Right AKA.   Neuro: Alert and oriented X 3. Moves all extremities spontaneously. No focal deficits noted. Psych:  Responds to questions appropriately with a normal affect. Skin: No rashes or lesions noted  Wt Readings from Last 3 Encounters:  01/22/16 160 lb (72.6 kg)  01/10/16 160 lb (72.6 kg)  03/22/15 160 lb (72.6 kg)     Studies/Labs Reviewed:   EKG:  EKG is not ordered today.   Recent Labs: 01/02/2016: B Natriuretic Peptide 583.1 01/10/2016: TSH 1.78 07/11/2016: ALT 9; BUN 15; Creatinine, Ser 0.99; Hemoglobin 10.2; Platelets 231; Potassium 3.2; Sodium 136  Lipid Panel    Component Value Date/Time   CHOL 89 01/10/2016 1339   TRIG 81.0 01/10/2016 1339   HDL 28.90 (L) 01/10/2016 1339   CHOLHDL 3 01/10/2016 1339   VLDL 16.2 01/10/2016 1339   LDLCALC 44 01/10/2016 1339    Additional studies/ records that were reviewed today include:   Echocardiogram: 03/2011 Study Conclusions  - Left ventricle: Wall thickness was increased in a pattern of moderate LVH. Systolic function was vigorous. The estimated ejection fraction was in the range of 65% to 70%. There was an increased relative contribution of atrial contraction to ventricular filling. - Aortic valve: There was moderate to severe stenosis. Valve area: 0.85cm^2(VTI). Valve area: 0.78cm^2 (Vmax). - Mitral valve: Calcified annulus.  Assessment:    1. Aortic valve stenosis, etiology of cardiac valve disease unspecified   2. Chronic diastolic congestive heart failure (HCC)   3. Persistent atrial fibrillation ([place])   4. Current use of long term anticoagulation   5. Essential hypertension      Plan:   In order of problems listed above:  1. Severe Aortic Stenosis - her stenosis was moderate to severe by echo in 2013. The patient and her family have  declined repeat echocardiograms at prior visits and continue to decline an echocardiogram at this time since it would not change her course of treatment.  - She is not overly active at baseline and it is difficult to obtain a history as to whether or not she has experienced symptoms. She denies any dyspnea on exertion, orthopnea, lightheadedness, dizziness, or presyncope. - I again discussed in detail with the patient and her son that her aortic stenosis is certainly severe by now on examination and was severe by her echo 5+ years ago. Her son questions whether or not this has improved and I again explained that the only treatment for her AS is surgery and with her multiple medical issues and advanced age, she is not a surgical candidate nor does the patient want to undergo surgery.  - Will continue with Lasix 20 mg twice a day for lower extremity edema. Her son will make [place] aware if she develops worsening symptoms. They report her Home Health MD is following things closely at this time. Would have a low threshold to refer to Hospice if symptoms acutely worsen.   2. Chronic Diastolic CHF - she does have chronic edema along her LLE. Consumes a significant amount of sodium and is not interested in reducing her salt intake.  - continue Lasix 20mg  BID. I requested that the patient have a BMET today to reassess kidney function and K+ levels but she declined. Will plan to have this drawn when her Home Health MD visits in the coming week.  3. Persistent Atrial Fibrillation/ Use of Long-term anticoagulation - she denies any dyspnea or palpitations. HR is irregular on examination and most consistent with atrial fibrillation.  - continue Coreg 12.5mg  BID for rate-control.  - This patients CHA2DS2-VASc Score and unadjusted Ischemic Stroke Rate (% per year) is equal to 11.2 % stroke rate/year from a score of 7 (HTN, Vascular, Female, Age (2), CVA (2)). She denies any evidence of active bleeding. Continue Eliquis 5mg   BID for anticoagulation as her only indication for reduced dosing is age. Creatinine was stable at 0.99 in 06/2016. Have requested BMET as above to reassess kidney function.   4. HTN - BP is well-controlled at 105/68 during today's visit. - Continue Coreg 12.5 mg BID and Lasix 20 mg BID.  Medication Adjustments/Labs and Tests Ordered: Current medicines are reviewed at length with the patient today.  Concerns regarding medicines are outlined above.  Medication changes, Labs and Tests ordered today are listed in the Patient Instructions below. Patient Instructions  Medication Instructions: Your physician recommends that you continue on your current medications as directed. Please refer to the Current Medication list given to you today.   Follow-Up: Your physician wants you to follow-up in: 6 months with Dr. Debara Pickett. You will receive a reminder letter in the mail two months in advance. If you don't receive a letter, please call our office to schedule the follow-up appointment.  If you need a refill on your cardiac medications before your next appointment, please call your pharmacy.     Signed, Christina Heritage, PA-C  11/04/2016 8:07 PM    [facility] [address], [place], [place]  [address] Phone: [phone]; Fax: [phone]  [address] [place], [place] [address] Phone: [phone]

## 2016-11-04 ENCOUNTER — Encounter: Payer: Self-pay | Admitting: Student

## 2016-11-04 ENCOUNTER — Ambulatory Visit (INDEPENDENT_AMBULATORY_CARE_PROVIDER_SITE_OTHER): Payer: Medicare HMO | Admitting: Student

## 2016-11-04 VITALS — BP 105/68 | HR 64

## 2016-11-04 DIAGNOSIS — I4819 Other persistent atrial fibrillation: Secondary | ICD-10-CM

## 2016-11-04 DIAGNOSIS — I350 Nonrheumatic aortic (valve) stenosis: Secondary | ICD-10-CM | POA: Diagnosis not present

## 2016-11-04 DIAGNOSIS — J8410 Pulmonary fibrosis, unspecified: Secondary | ICD-10-CM | POA: Diagnosis not present

## 2016-11-04 DIAGNOSIS — I130 Hypertensive heart and chronic kidney disease with heart failure and stage 1 through stage 4 chronic kidney disease, or unspecified chronic kidney disease: Secondary | ICD-10-CM | POA: Diagnosis not present

## 2016-11-04 DIAGNOSIS — S81002D Unspecified open wound, left knee, subsequent encounter: Secondary | ICD-10-CM | POA: Diagnosis not present

## 2016-11-04 DIAGNOSIS — I5032 Chronic diastolic (congestive) heart failure: Secondary | ICD-10-CM

## 2016-11-04 DIAGNOSIS — I10 Essential (primary) hypertension: Secondary | ICD-10-CM | POA: Diagnosis not present

## 2016-11-04 DIAGNOSIS — Z7901 Long term (current) use of anticoagulants: Secondary | ICD-10-CM | POA: Diagnosis not present

## 2016-11-04 DIAGNOSIS — L97222 Non-pressure chronic ulcer of left calf with fat layer exposed: Secondary | ICD-10-CM | POA: Diagnosis not present

## 2016-11-04 DIAGNOSIS — E1122 Type 2 diabetes mellitus with diabetic chronic kidney disease: Secondary | ICD-10-CM | POA: Diagnosis not present

## 2016-11-04 DIAGNOSIS — I872 Venous insufficiency (chronic) (peripheral): Secondary | ICD-10-CM | POA: Diagnosis not present

## 2016-11-04 DIAGNOSIS — F0390 Unspecified dementia without behavioral disturbance: Secondary | ICD-10-CM | POA: Diagnosis not present

## 2016-11-04 DIAGNOSIS — I481 Persistent atrial fibrillation: Secondary | ICD-10-CM | POA: Diagnosis not present

## 2016-11-04 DIAGNOSIS — I509 Heart failure, unspecified: Secondary | ICD-10-CM | POA: Diagnosis not present

## 2016-11-04 DIAGNOSIS — L97112 Non-pressure chronic ulcer of right thigh with fat layer exposed: Secondary | ICD-10-CM | POA: Diagnosis not present

## 2016-11-04 DIAGNOSIS — S81802D Unspecified open wound, left lower leg, subsequent encounter: Secondary | ICD-10-CM | POA: Diagnosis not present

## 2016-11-04 DIAGNOSIS — N183 Chronic kidney disease, stage 3 (moderate): Secondary | ICD-10-CM | POA: Diagnosis not present

## 2016-11-04 MED ORDER — CARVEDILOL 12.5 MG PO TABS: 12.5000 mg | ORAL_TABLET | Freq: Two times a day (BID) | ORAL | 2 refills | Status: AC

## 2016-11-04 NOTE — Patient Instructions (Signed)
Medication Instructions: Your physician recommends that you continue on your current medications as directed. Please refer to the Current Medication list given to you today.   Follow-Up: Your physician wants you to follow-up in: 6 months with Dr. Hilty. You will receive a reminder letter in the mail two months in advance. If you don't receive a letter, please call our office to schedule the follow-up appointment.  If you need a refill on your cardiac medications before your next appointment, please call your pharmacy.  

## 2016-11-12 DIAGNOSIS — L97909 Non-pressure chronic ulcer of unspecified part of unspecified lower leg with unspecified severity: Secondary | ICD-10-CM | POA: Diagnosis not present

## 2016-11-12 DIAGNOSIS — L97112 Non-pressure chronic ulcer of right thigh with fat layer exposed: Secondary | ICD-10-CM | POA: Diagnosis not present

## 2016-11-12 DIAGNOSIS — Z8673 Personal history of transient ischemic attack (TIA), and cerebral infarction without residual deficits: Secondary | ICD-10-CM | POA: Diagnosis not present

## 2016-11-12 DIAGNOSIS — E1122 Type 2 diabetes mellitus with diabetic chronic kidney disease: Secondary | ICD-10-CM | POA: Diagnosis not present

## 2016-11-12 DIAGNOSIS — F0390 Unspecified dementia without behavioral disturbance: Secondary | ICD-10-CM | POA: Diagnosis not present

## 2016-11-12 DIAGNOSIS — L97222 Non-pressure chronic ulcer of left calf with fat layer exposed: Secondary | ICD-10-CM | POA: Diagnosis not present

## 2016-11-12 DIAGNOSIS — R1310 Dysphagia, unspecified: Secondary | ICD-10-CM | POA: Diagnosis not present

## 2016-11-12 DIAGNOSIS — N183 Chronic kidney disease, stage 3 (moderate): Secondary | ICD-10-CM | POA: Diagnosis not present

## 2016-11-12 DIAGNOSIS — I10 Essential (primary) hypertension: Secondary | ICD-10-CM | POA: Diagnosis not present

## 2016-11-12 DIAGNOSIS — G4700 Insomnia, unspecified: Secondary | ICD-10-CM | POA: Diagnosis not present

## 2016-11-12 DIAGNOSIS — I130 Hypertensive heart and chronic kidney disease with heart failure and stage 1 through stage 4 chronic kidney disease, or unspecified chronic kidney disease: Secondary | ICD-10-CM | POA: Diagnosis not present

## 2016-11-12 DIAGNOSIS — I509 Heart failure, unspecified: Secondary | ICD-10-CM | POA: Diagnosis not present

## 2016-11-12 DIAGNOSIS — E119 Type 2 diabetes mellitus without complications: Secondary | ICD-10-CM | POA: Diagnosis not present

## 2016-11-12 DIAGNOSIS — J8410 Pulmonary fibrosis, unspecified: Secondary | ICD-10-CM | POA: Diagnosis not present

## 2016-11-12 DIAGNOSIS — I872 Venous insufficiency (chronic) (peripheral): Secondary | ICD-10-CM | POA: Diagnosis not present

## 2016-11-14 DIAGNOSIS — I872 Venous insufficiency (chronic) (peripheral): Secondary | ICD-10-CM | POA: Diagnosis not present

## 2016-11-14 DIAGNOSIS — E1122 Type 2 diabetes mellitus with diabetic chronic kidney disease: Secondary | ICD-10-CM | POA: Diagnosis not present

## 2016-11-14 DIAGNOSIS — F0390 Unspecified dementia without behavioral disturbance: Secondary | ICD-10-CM | POA: Diagnosis not present

## 2016-11-14 DIAGNOSIS — L97222 Non-pressure chronic ulcer of left calf with fat layer exposed: Secondary | ICD-10-CM | POA: Diagnosis not present

## 2016-11-14 DIAGNOSIS — I130 Hypertensive heart and chronic kidney disease with heart failure and stage 1 through stage 4 chronic kidney disease, or unspecified chronic kidney disease: Secondary | ICD-10-CM | POA: Diagnosis not present

## 2016-11-14 DIAGNOSIS — I509 Heart failure, unspecified: Secondary | ICD-10-CM | POA: Diagnosis not present

## 2016-11-14 DIAGNOSIS — J8410 Pulmonary fibrosis, unspecified: Secondary | ICD-10-CM | POA: Diagnosis not present

## 2016-11-14 DIAGNOSIS — N183 Chronic kidney disease, stage 3 (moderate): Secondary | ICD-10-CM | POA: Diagnosis not present

## 2016-11-14 DIAGNOSIS — L97112 Non-pressure chronic ulcer of right thigh with fat layer exposed: Secondary | ICD-10-CM | POA: Diagnosis not present

## 2016-11-18 DIAGNOSIS — L97222 Non-pressure chronic ulcer of left calf with fat layer exposed: Secondary | ICD-10-CM | POA: Diagnosis not present

## 2016-11-18 DIAGNOSIS — I130 Hypertensive heart and chronic kidney disease with heart failure and stage 1 through stage 4 chronic kidney disease, or unspecified chronic kidney disease: Secondary | ICD-10-CM | POA: Diagnosis not present

## 2016-11-18 DIAGNOSIS — F0390 Unspecified dementia without behavioral disturbance: Secondary | ICD-10-CM | POA: Diagnosis not present

## 2016-11-18 DIAGNOSIS — J8410 Pulmonary fibrosis, unspecified: Secondary | ICD-10-CM | POA: Diagnosis not present

## 2016-11-18 DIAGNOSIS — E1122 Type 2 diabetes mellitus with diabetic chronic kidney disease: Secondary | ICD-10-CM | POA: Diagnosis not present

## 2016-11-18 DIAGNOSIS — L97112 Non-pressure chronic ulcer of right thigh with fat layer exposed: Secondary | ICD-10-CM | POA: Diagnosis not present

## 2016-11-18 DIAGNOSIS — I509 Heart failure, unspecified: Secondary | ICD-10-CM | POA: Diagnosis not present

## 2016-11-18 DIAGNOSIS — I872 Venous insufficiency (chronic) (peripheral): Secondary | ICD-10-CM | POA: Diagnosis not present

## 2016-11-18 DIAGNOSIS — N183 Chronic kidney disease, stage 3 (moderate): Secondary | ICD-10-CM | POA: Diagnosis not present

## 2016-11-21 DIAGNOSIS — E1122 Type 2 diabetes mellitus with diabetic chronic kidney disease: Secondary | ICD-10-CM | POA: Diagnosis not present

## 2016-11-21 DIAGNOSIS — I509 Heart failure, unspecified: Secondary | ICD-10-CM | POA: Diagnosis not present

## 2016-11-21 DIAGNOSIS — L97222 Non-pressure chronic ulcer of left calf with fat layer exposed: Secondary | ICD-10-CM | POA: Diagnosis not present

## 2016-11-21 DIAGNOSIS — N183 Chronic kidney disease, stage 3 (moderate): Secondary | ICD-10-CM | POA: Diagnosis not present

## 2016-11-21 DIAGNOSIS — L97112 Non-pressure chronic ulcer of right thigh with fat layer exposed: Secondary | ICD-10-CM | POA: Diagnosis not present

## 2016-11-21 DIAGNOSIS — I130 Hypertensive heart and chronic kidney disease with heart failure and stage 1 through stage 4 chronic kidney disease, or unspecified chronic kidney disease: Secondary | ICD-10-CM | POA: Diagnosis not present

## 2016-11-21 DIAGNOSIS — I872 Venous insufficiency (chronic) (peripheral): Secondary | ICD-10-CM | POA: Diagnosis not present

## 2016-11-21 DIAGNOSIS — F0390 Unspecified dementia without behavioral disturbance: Secondary | ICD-10-CM | POA: Diagnosis not present

## 2016-11-21 DIAGNOSIS — J8410 Pulmonary fibrosis, unspecified: Secondary | ICD-10-CM | POA: Diagnosis not present

## 2016-11-25 DIAGNOSIS — I130 Hypertensive heart and chronic kidney disease with heart failure and stage 1 through stage 4 chronic kidney disease, or unspecified chronic kidney disease: Secondary | ICD-10-CM | POA: Diagnosis not present

## 2016-11-25 DIAGNOSIS — N183 Chronic kidney disease, stage 3 (moderate): Secondary | ICD-10-CM | POA: Diagnosis not present

## 2016-11-25 DIAGNOSIS — L97112 Non-pressure chronic ulcer of right thigh with fat layer exposed: Secondary | ICD-10-CM | POA: Diagnosis not present

## 2016-11-25 DIAGNOSIS — I872 Venous insufficiency (chronic) (peripheral): Secondary | ICD-10-CM | POA: Diagnosis not present

## 2016-11-25 DIAGNOSIS — J8410 Pulmonary fibrosis, unspecified: Secondary | ICD-10-CM | POA: Diagnosis not present

## 2016-11-25 DIAGNOSIS — I509 Heart failure, unspecified: Secondary | ICD-10-CM | POA: Diagnosis not present

## 2016-11-25 DIAGNOSIS — E1122 Type 2 diabetes mellitus with diabetic chronic kidney disease: Secondary | ICD-10-CM | POA: Diagnosis not present

## 2016-11-25 DIAGNOSIS — L97222 Non-pressure chronic ulcer of left calf with fat layer exposed: Secondary | ICD-10-CM | POA: Diagnosis not present

## 2016-11-25 DIAGNOSIS — F0390 Unspecified dementia without behavioral disturbance: Secondary | ICD-10-CM | POA: Diagnosis not present

## 2016-11-28 DIAGNOSIS — I130 Hypertensive heart and chronic kidney disease with heart failure and stage 1 through stage 4 chronic kidney disease, or unspecified chronic kidney disease: Secondary | ICD-10-CM | POA: Diagnosis not present

## 2016-11-28 DIAGNOSIS — I509 Heart failure, unspecified: Secondary | ICD-10-CM | POA: Diagnosis not present

## 2016-11-28 DIAGNOSIS — L97222 Non-pressure chronic ulcer of left calf with fat layer exposed: Secondary | ICD-10-CM | POA: Diagnosis not present

## 2016-11-28 DIAGNOSIS — N183 Chronic kidney disease, stage 3 (moderate): Secondary | ICD-10-CM | POA: Diagnosis not present

## 2016-11-28 DIAGNOSIS — I872 Venous insufficiency (chronic) (peripheral): Secondary | ICD-10-CM | POA: Diagnosis not present

## 2016-11-28 DIAGNOSIS — L97112 Non-pressure chronic ulcer of right thigh with fat layer exposed: Secondary | ICD-10-CM | POA: Diagnosis not present

## 2016-11-28 DIAGNOSIS — E1122 Type 2 diabetes mellitus with diabetic chronic kidney disease: Secondary | ICD-10-CM | POA: Diagnosis not present

## 2016-11-28 DIAGNOSIS — F0390 Unspecified dementia without behavioral disturbance: Secondary | ICD-10-CM | POA: Diagnosis not present

## 2016-11-28 DIAGNOSIS — J8410 Pulmonary fibrosis, unspecified: Secondary | ICD-10-CM | POA: Diagnosis not present

## 2016-11-30 DIAGNOSIS — L97222 Non-pressure chronic ulcer of left calf with fat layer exposed: Secondary | ICD-10-CM | POA: Diagnosis not present

## 2016-11-30 DIAGNOSIS — L97112 Non-pressure chronic ulcer of right thigh with fat layer exposed: Secondary | ICD-10-CM | POA: Diagnosis not present

## 2016-11-30 DIAGNOSIS — I509 Heart failure, unspecified: Secondary | ICD-10-CM | POA: Diagnosis not present

## 2016-11-30 DIAGNOSIS — N183 Chronic kidney disease, stage 3 (moderate): Secondary | ICD-10-CM | POA: Diagnosis not present

## 2016-12-02 DIAGNOSIS — I509 Heart failure, unspecified: Secondary | ICD-10-CM | POA: Diagnosis not present

## 2016-12-02 DIAGNOSIS — I130 Hypertensive heart and chronic kidney disease with heart failure and stage 1 through stage 4 chronic kidney disease, or unspecified chronic kidney disease: Secondary | ICD-10-CM | POA: Diagnosis not present

## 2016-12-02 DIAGNOSIS — J8410 Pulmonary fibrosis, unspecified: Secondary | ICD-10-CM | POA: Diagnosis not present

## 2016-12-02 DIAGNOSIS — L97112 Non-pressure chronic ulcer of right thigh with fat layer exposed: Secondary | ICD-10-CM | POA: Diagnosis not present

## 2016-12-02 DIAGNOSIS — L97222 Non-pressure chronic ulcer of left calf with fat layer exposed: Secondary | ICD-10-CM | POA: Diagnosis not present

## 2016-12-02 DIAGNOSIS — F0390 Unspecified dementia without behavioral disturbance: Secondary | ICD-10-CM | POA: Diagnosis not present

## 2016-12-02 DIAGNOSIS — N183 Chronic kidney disease, stage 3 (moderate): Secondary | ICD-10-CM | POA: Diagnosis not present

## 2016-12-02 DIAGNOSIS — E1122 Type 2 diabetes mellitus with diabetic chronic kidney disease: Secondary | ICD-10-CM | POA: Diagnosis not present

## 2016-12-02 DIAGNOSIS — I872 Venous insufficiency (chronic) (peripheral): Secondary | ICD-10-CM | POA: Diagnosis not present

## 2016-12-05 DIAGNOSIS — E1122 Type 2 diabetes mellitus with diabetic chronic kidney disease: Secondary | ICD-10-CM | POA: Diagnosis not present

## 2016-12-05 DIAGNOSIS — L97222 Non-pressure chronic ulcer of left calf with fat layer exposed: Secondary | ICD-10-CM | POA: Diagnosis not present

## 2016-12-05 DIAGNOSIS — I509 Heart failure, unspecified: Secondary | ICD-10-CM | POA: Diagnosis not present

## 2016-12-05 DIAGNOSIS — J8410 Pulmonary fibrosis, unspecified: Secondary | ICD-10-CM | POA: Diagnosis not present

## 2016-12-05 DIAGNOSIS — I872 Venous insufficiency (chronic) (peripheral): Secondary | ICD-10-CM | POA: Diagnosis not present

## 2016-12-05 DIAGNOSIS — F0390 Unspecified dementia without behavioral disturbance: Secondary | ICD-10-CM | POA: Diagnosis not present

## 2016-12-05 DIAGNOSIS — I130 Hypertensive heart and chronic kidney disease with heart failure and stage 1 through stage 4 chronic kidney disease, or unspecified chronic kidney disease: Secondary | ICD-10-CM | POA: Diagnosis not present

## 2016-12-05 DIAGNOSIS — N183 Chronic kidney disease, stage 3 (moderate): Secondary | ICD-10-CM | POA: Diagnosis not present

## 2016-12-05 DIAGNOSIS — L97112 Non-pressure chronic ulcer of right thigh with fat layer exposed: Secondary | ICD-10-CM | POA: Diagnosis not present

## 2016-12-12 DIAGNOSIS — L97222 Non-pressure chronic ulcer of left calf with fat layer exposed: Secondary | ICD-10-CM | POA: Diagnosis not present

## 2016-12-12 DIAGNOSIS — I872 Venous insufficiency (chronic) (peripheral): Secondary | ICD-10-CM | POA: Diagnosis not present

## 2016-12-12 DIAGNOSIS — F0390 Unspecified dementia without behavioral disturbance: Secondary | ICD-10-CM | POA: Diagnosis not present

## 2016-12-12 DIAGNOSIS — N183 Chronic kidney disease, stage 3 (moderate): Secondary | ICD-10-CM | POA: Diagnosis not present

## 2016-12-12 DIAGNOSIS — I130 Hypertensive heart and chronic kidney disease with heart failure and stage 1 through stage 4 chronic kidney disease, or unspecified chronic kidney disease: Secondary | ICD-10-CM | POA: Diagnosis not present

## 2016-12-12 DIAGNOSIS — L97112 Non-pressure chronic ulcer of right thigh with fat layer exposed: Secondary | ICD-10-CM | POA: Diagnosis not present

## 2016-12-12 DIAGNOSIS — J8410 Pulmonary fibrosis, unspecified: Secondary | ICD-10-CM | POA: Diagnosis not present

## 2016-12-12 DIAGNOSIS — E1122 Type 2 diabetes mellitus with diabetic chronic kidney disease: Secondary | ICD-10-CM | POA: Diagnosis not present

## 2016-12-12 DIAGNOSIS — I509 Heart failure, unspecified: Secondary | ICD-10-CM | POA: Diagnosis not present

## 2016-12-14 ENCOUNTER — Other Ambulatory Visit: Payer: Self-pay | Admitting: Student

## 2016-12-16 DIAGNOSIS — I130 Hypertensive heart and chronic kidney disease with heart failure and stage 1 through stage 4 chronic kidney disease, or unspecified chronic kidney disease: Secondary | ICD-10-CM | POA: Diagnosis not present

## 2016-12-16 DIAGNOSIS — J8410 Pulmonary fibrosis, unspecified: Secondary | ICD-10-CM | POA: Diagnosis not present

## 2016-12-16 DIAGNOSIS — L97112 Non-pressure chronic ulcer of right thigh with fat layer exposed: Secondary | ICD-10-CM | POA: Diagnosis not present

## 2016-12-16 DIAGNOSIS — I872 Venous insufficiency (chronic) (peripheral): Secondary | ICD-10-CM | POA: Diagnosis not present

## 2016-12-16 DIAGNOSIS — I509 Heart failure, unspecified: Secondary | ICD-10-CM | POA: Diagnosis not present

## 2016-12-16 DIAGNOSIS — N183 Chronic kidney disease, stage 3 (moderate): Secondary | ICD-10-CM | POA: Diagnosis not present

## 2016-12-16 DIAGNOSIS — F0390 Unspecified dementia without behavioral disturbance: Secondary | ICD-10-CM | POA: Diagnosis not present

## 2016-12-16 DIAGNOSIS — E1122 Type 2 diabetes mellitus with diabetic chronic kidney disease: Secondary | ICD-10-CM | POA: Diagnosis not present

## 2016-12-16 DIAGNOSIS — L97222 Non-pressure chronic ulcer of left calf with fat layer exposed: Secondary | ICD-10-CM | POA: Diagnosis not present

## 2016-12-17 DIAGNOSIS — M7989 Other specified soft tissue disorders: Secondary | ICD-10-CM | POA: Diagnosis not present

## 2016-12-17 DIAGNOSIS — I10 Essential (primary) hypertension: Secondary | ICD-10-CM | POA: Diagnosis not present

## 2016-12-17 DIAGNOSIS — E119 Type 2 diabetes mellitus without complications: Secondary | ICD-10-CM | POA: Diagnosis not present

## 2016-12-17 DIAGNOSIS — F0390 Unspecified dementia without behavioral disturbance: Secondary | ICD-10-CM | POA: Diagnosis not present

## 2016-12-18 DIAGNOSIS — I872 Venous insufficiency (chronic) (peripheral): Secondary | ICD-10-CM | POA: Diagnosis not present

## 2016-12-18 DIAGNOSIS — J8410 Pulmonary fibrosis, unspecified: Secondary | ICD-10-CM | POA: Diagnosis not present

## 2016-12-18 DIAGNOSIS — L97222 Non-pressure chronic ulcer of left calf with fat layer exposed: Secondary | ICD-10-CM | POA: Diagnosis not present

## 2016-12-18 DIAGNOSIS — L97112 Non-pressure chronic ulcer of right thigh with fat layer exposed: Secondary | ICD-10-CM | POA: Diagnosis not present

## 2016-12-18 DIAGNOSIS — F0390 Unspecified dementia without behavioral disturbance: Secondary | ICD-10-CM | POA: Diagnosis not present

## 2016-12-18 DIAGNOSIS — I130 Hypertensive heart and chronic kidney disease with heart failure and stage 1 through stage 4 chronic kidney disease, or unspecified chronic kidney disease: Secondary | ICD-10-CM | POA: Diagnosis not present

## 2016-12-18 DIAGNOSIS — I509 Heart failure, unspecified: Secondary | ICD-10-CM | POA: Diagnosis not present

## 2016-12-18 DIAGNOSIS — E1122 Type 2 diabetes mellitus with diabetic chronic kidney disease: Secondary | ICD-10-CM | POA: Diagnosis not present

## 2016-12-18 DIAGNOSIS — N183 Chronic kidney disease, stage 3 (moderate): Secondary | ICD-10-CM | POA: Diagnosis not present

## 2016-12-23 DIAGNOSIS — I872 Venous insufficiency (chronic) (peripheral): Secondary | ICD-10-CM | POA: Diagnosis not present

## 2016-12-23 DIAGNOSIS — L97222 Non-pressure chronic ulcer of left calf with fat layer exposed: Secondary | ICD-10-CM | POA: Diagnosis not present

## 2016-12-23 DIAGNOSIS — F0390 Unspecified dementia without behavioral disturbance: Secondary | ICD-10-CM | POA: Diagnosis not present

## 2016-12-23 DIAGNOSIS — E1122 Type 2 diabetes mellitus with diabetic chronic kidney disease: Secondary | ICD-10-CM | POA: Diagnosis not present

## 2016-12-23 DIAGNOSIS — I512 Rupture of papillary muscle, not elsewhere classified: Secondary | ICD-10-CM | POA: Diagnosis not present

## 2016-12-23 DIAGNOSIS — I509 Heart failure, unspecified: Secondary | ICD-10-CM | POA: Diagnosis not present

## 2016-12-23 DIAGNOSIS — N183 Chronic kidney disease, stage 3 (moderate): Secondary | ICD-10-CM | POA: Diagnosis not present

## 2016-12-23 DIAGNOSIS — L97112 Non-pressure chronic ulcer of right thigh with fat layer exposed: Secondary | ICD-10-CM | POA: Diagnosis not present

## 2016-12-23 DIAGNOSIS — J8410 Pulmonary fibrosis, unspecified: Secondary | ICD-10-CM | POA: Diagnosis not present

## 2016-12-23 DIAGNOSIS — I130 Hypertensive heart and chronic kidney disease with heart failure and stage 1 through stage 4 chronic kidney disease, or unspecified chronic kidney disease: Secondary | ICD-10-CM | POA: Diagnosis not present

## 2016-12-26 DIAGNOSIS — F0390 Unspecified dementia without behavioral disturbance: Secondary | ICD-10-CM | POA: Diagnosis not present

## 2016-12-26 DIAGNOSIS — L97112 Non-pressure chronic ulcer of right thigh with fat layer exposed: Secondary | ICD-10-CM | POA: Diagnosis not present

## 2016-12-26 DIAGNOSIS — I872 Venous insufficiency (chronic) (peripheral): Secondary | ICD-10-CM | POA: Diagnosis not present

## 2016-12-26 DIAGNOSIS — R05 Cough: Secondary | ICD-10-CM | POA: Diagnosis not present

## 2016-12-26 DIAGNOSIS — I509 Heart failure, unspecified: Secondary | ICD-10-CM | POA: Diagnosis not present

## 2016-12-26 DIAGNOSIS — I130 Hypertensive heart and chronic kidney disease with heart failure and stage 1 through stage 4 chronic kidney disease, or unspecified chronic kidney disease: Secondary | ICD-10-CM | POA: Diagnosis not present

## 2016-12-26 DIAGNOSIS — E1122 Type 2 diabetes mellitus with diabetic chronic kidney disease: Secondary | ICD-10-CM | POA: Diagnosis not present

## 2016-12-26 DIAGNOSIS — L97222 Non-pressure chronic ulcer of left calf with fat layer exposed: Secondary | ICD-10-CM | POA: Diagnosis not present

## 2016-12-26 DIAGNOSIS — N183 Chronic kidney disease, stage 3 (moderate): Secondary | ICD-10-CM | POA: Diagnosis not present

## 2016-12-26 DIAGNOSIS — J8410 Pulmonary fibrosis, unspecified: Secondary | ICD-10-CM | POA: Diagnosis not present

## 2016-12-27 DIAGNOSIS — I10 Essential (primary) hypertension: Secondary | ICD-10-CM | POA: Diagnosis not present

## 2016-12-27 DIAGNOSIS — R05 Cough: Secondary | ICD-10-CM | POA: Diagnosis not present

## 2016-12-27 DIAGNOSIS — Z8673 Personal history of transient ischemic attack (TIA), and cerebral infarction without residual deficits: Secondary | ICD-10-CM | POA: Diagnosis not present

## 2016-12-27 DIAGNOSIS — J189 Pneumonia, unspecified organism: Secondary | ICD-10-CM | POA: Diagnosis not present

## 2016-12-27 DIAGNOSIS — R600 Localized edema: Secondary | ICD-10-CM | POA: Diagnosis not present

## 2016-12-27 DIAGNOSIS — E119 Type 2 diabetes mellitus without complications: Secondary | ICD-10-CM | POA: Diagnosis not present

## 2016-12-30 DIAGNOSIS — F0390 Unspecified dementia without behavioral disturbance: Secondary | ICD-10-CM | POA: Diagnosis not present

## 2016-12-30 DIAGNOSIS — I130 Hypertensive heart and chronic kidney disease with heart failure and stage 1 through stage 4 chronic kidney disease, or unspecified chronic kidney disease: Secondary | ICD-10-CM | POA: Diagnosis not present

## 2016-12-30 DIAGNOSIS — N183 Chronic kidney disease, stage 3 (moderate): Secondary | ICD-10-CM | POA: Diagnosis not present

## 2016-12-30 DIAGNOSIS — I872 Venous insufficiency (chronic) (peripheral): Secondary | ICD-10-CM | POA: Diagnosis not present

## 2016-12-30 DIAGNOSIS — L97222 Non-pressure chronic ulcer of left calf with fat layer exposed: Secondary | ICD-10-CM | POA: Diagnosis not present

## 2016-12-30 DIAGNOSIS — L97112 Non-pressure chronic ulcer of right thigh with fat layer exposed: Secondary | ICD-10-CM | POA: Diagnosis not present

## 2016-12-30 DIAGNOSIS — I509 Heart failure, unspecified: Secondary | ICD-10-CM | POA: Diagnosis not present

## 2016-12-30 DIAGNOSIS — E1122 Type 2 diabetes mellitus with diabetic chronic kidney disease: Secondary | ICD-10-CM | POA: Diagnosis not present

## 2016-12-30 DIAGNOSIS — J8410 Pulmonary fibrosis, unspecified: Secondary | ICD-10-CM | POA: Diagnosis not present

## 2017-01-01 DIAGNOSIS — J8410 Pulmonary fibrosis, unspecified: Secondary | ICD-10-CM | POA: Diagnosis not present

## 2017-01-01 DIAGNOSIS — E1122 Type 2 diabetes mellitus with diabetic chronic kidney disease: Secondary | ICD-10-CM | POA: Diagnosis not present

## 2017-01-01 DIAGNOSIS — I130 Hypertensive heart and chronic kidney disease with heart failure and stage 1 through stage 4 chronic kidney disease, or unspecified chronic kidney disease: Secondary | ICD-10-CM | POA: Diagnosis not present

## 2017-01-01 DIAGNOSIS — I509 Heart failure, unspecified: Secondary | ICD-10-CM | POA: Diagnosis not present

## 2017-01-01 DIAGNOSIS — L97112 Non-pressure chronic ulcer of right thigh with fat layer exposed: Secondary | ICD-10-CM | POA: Diagnosis not present

## 2017-01-01 DIAGNOSIS — I872 Venous insufficiency (chronic) (peripheral): Secondary | ICD-10-CM | POA: Diagnosis not present

## 2017-01-01 DIAGNOSIS — F0390 Unspecified dementia without behavioral disturbance: Secondary | ICD-10-CM | POA: Diagnosis not present

## 2017-01-01 DIAGNOSIS — N183 Chronic kidney disease, stage 3 (moderate): Secondary | ICD-10-CM | POA: Diagnosis not present

## 2017-01-01 DIAGNOSIS — L97222 Non-pressure chronic ulcer of left calf with fat layer exposed: Secondary | ICD-10-CM | POA: Diagnosis not present

## 2017-01-03 DIAGNOSIS — J8410 Pulmonary fibrosis, unspecified: Secondary | ICD-10-CM | POA: Diagnosis not present

## 2017-01-03 DIAGNOSIS — N183 Chronic kidney disease, stage 3 (moderate): Secondary | ICD-10-CM | POA: Diagnosis not present

## 2017-01-03 DIAGNOSIS — I872 Venous insufficiency (chronic) (peripheral): Secondary | ICD-10-CM | POA: Diagnosis not present

## 2017-01-03 DIAGNOSIS — I509 Heart failure, unspecified: Secondary | ICD-10-CM | POA: Diagnosis not present

## 2017-01-03 DIAGNOSIS — L97222 Non-pressure chronic ulcer of left calf with fat layer exposed: Secondary | ICD-10-CM | POA: Diagnosis not present

## 2017-01-03 DIAGNOSIS — I130 Hypertensive heart and chronic kidney disease with heart failure and stage 1 through stage 4 chronic kidney disease, or unspecified chronic kidney disease: Secondary | ICD-10-CM | POA: Diagnosis not present

## 2017-01-03 DIAGNOSIS — E1122 Type 2 diabetes mellitus with diabetic chronic kidney disease: Secondary | ICD-10-CM | POA: Diagnosis not present

## 2017-01-03 DIAGNOSIS — L97112 Non-pressure chronic ulcer of right thigh with fat layer exposed: Secondary | ICD-10-CM | POA: Diagnosis not present

## 2017-01-03 DIAGNOSIS — F0390 Unspecified dementia without behavioral disturbance: Secondary | ICD-10-CM | POA: Diagnosis not present

## 2017-01-07 DIAGNOSIS — I872 Venous insufficiency (chronic) (peripheral): Secondary | ICD-10-CM | POA: Diagnosis not present

## 2017-01-07 DIAGNOSIS — N183 Chronic kidney disease, stage 3 (moderate): Secondary | ICD-10-CM | POA: Diagnosis not present

## 2017-01-07 DIAGNOSIS — I509 Heart failure, unspecified: Secondary | ICD-10-CM | POA: Diagnosis not present

## 2017-01-07 DIAGNOSIS — F0390 Unspecified dementia without behavioral disturbance: Secondary | ICD-10-CM | POA: Diagnosis not present

## 2017-01-07 DIAGNOSIS — L97112 Non-pressure chronic ulcer of right thigh with fat layer exposed: Secondary | ICD-10-CM | POA: Diagnosis not present

## 2017-01-07 DIAGNOSIS — I130 Hypertensive heart and chronic kidney disease with heart failure and stage 1 through stage 4 chronic kidney disease, or unspecified chronic kidney disease: Secondary | ICD-10-CM | POA: Diagnosis not present

## 2017-01-07 DIAGNOSIS — E1122 Type 2 diabetes mellitus with diabetic chronic kidney disease: Secondary | ICD-10-CM | POA: Diagnosis not present

## 2017-01-07 DIAGNOSIS — L97222 Non-pressure chronic ulcer of left calf with fat layer exposed: Secondary | ICD-10-CM | POA: Diagnosis not present

## 2017-01-07 DIAGNOSIS — J8410 Pulmonary fibrosis, unspecified: Secondary | ICD-10-CM | POA: Diagnosis not present

## 2017-01-08 DIAGNOSIS — Z885 Allergy status to narcotic agent status: Secondary | ICD-10-CM | POA: Diagnosis not present

## 2017-01-08 DIAGNOSIS — R05 Cough: Secondary | ICD-10-CM | POA: Diagnosis not present

## 2017-01-08 DIAGNOSIS — I110 Hypertensive heart disease with heart failure: Secondary | ICD-10-CM | POA: Diagnosis not present

## 2017-01-08 DIAGNOSIS — Z86718 Personal history of other venous thrombosis and embolism: Secondary | ICD-10-CM | POA: Diagnosis not present

## 2017-01-08 DIAGNOSIS — F0390 Unspecified dementia without behavioral disturbance: Secondary | ICD-10-CM | POA: Diagnosis not present

## 2017-01-08 DIAGNOSIS — Z8673 Personal history of transient ischemic attack (TIA), and cerebral infarction without residual deficits: Secondary | ICD-10-CM | POA: Diagnosis not present

## 2017-01-08 DIAGNOSIS — R000 Tachycardia, unspecified: Secondary | ICD-10-CM | POA: Diagnosis not present

## 2017-01-08 DIAGNOSIS — I509 Heart failure, unspecified: Secondary | ICD-10-CM | POA: Diagnosis not present

## 2017-01-08 DIAGNOSIS — J90 Pleural effusion, not elsewhere classified: Secondary | ICD-10-CM | POA: Diagnosis not present

## 2017-01-08 DIAGNOSIS — Z7982 Long term (current) use of aspirin: Secondary | ICD-10-CM | POA: Diagnosis not present

## 2017-01-08 DIAGNOSIS — Z7901 Long term (current) use of anticoagulants: Secondary | ICD-10-CM | POA: Diagnosis not present

## 2017-01-08 DIAGNOSIS — Z888 Allergy status to other drugs, medicaments and biological substances status: Secondary | ICD-10-CM | POA: Diagnosis not present

## 2017-01-08 DIAGNOSIS — R918 Other nonspecific abnormal finding of lung field: Secondary | ICD-10-CM | POA: Diagnosis not present

## 2017-01-08 DIAGNOSIS — Z7902 Long term (current) use of antithrombotics/antiplatelets: Secondary | ICD-10-CM | POA: Diagnosis not present

## 2017-01-08 DIAGNOSIS — R531 Weakness: Secondary | ICD-10-CM | POA: Diagnosis not present

## 2017-01-08 DIAGNOSIS — J189 Pneumonia, unspecified organism: Secondary | ICD-10-CM | POA: Diagnosis not present

## 2017-01-08 DIAGNOSIS — I10 Essential (primary) hypertension: Secondary | ICD-10-CM | POA: Diagnosis not present

## 2017-01-09 DIAGNOSIS — I130 Hypertensive heart and chronic kidney disease with heart failure and stage 1 through stage 4 chronic kidney disease, or unspecified chronic kidney disease: Secondary | ICD-10-CM | POA: Diagnosis not present

## 2017-01-09 DIAGNOSIS — J8410 Pulmonary fibrosis, unspecified: Secondary | ICD-10-CM | POA: Diagnosis not present

## 2017-01-09 DIAGNOSIS — L97222 Non-pressure chronic ulcer of left calf with fat layer exposed: Secondary | ICD-10-CM | POA: Diagnosis not present

## 2017-01-09 DIAGNOSIS — E1122 Type 2 diabetes mellitus with diabetic chronic kidney disease: Secondary | ICD-10-CM | POA: Diagnosis not present

## 2017-01-09 DIAGNOSIS — N183 Chronic kidney disease, stage 3 (moderate): Secondary | ICD-10-CM | POA: Diagnosis not present

## 2017-01-09 DIAGNOSIS — L97112 Non-pressure chronic ulcer of right thigh with fat layer exposed: Secondary | ICD-10-CM | POA: Diagnosis not present

## 2017-01-09 DIAGNOSIS — F0390 Unspecified dementia without behavioral disturbance: Secondary | ICD-10-CM | POA: Diagnosis not present

## 2017-01-09 DIAGNOSIS — I509 Heart failure, unspecified: Secondary | ICD-10-CM | POA: Diagnosis not present

## 2017-01-09 DIAGNOSIS — I872 Venous insufficiency (chronic) (peripheral): Secondary | ICD-10-CM | POA: Diagnosis not present

## 2017-01-10 ENCOUNTER — Emergency Department (HOSPITAL_COMMUNITY)
Admission: EM | Admit: 2017-01-10 | Discharge: 2017-01-10 | Disposition: A | Discharge status: Home / Self Care | Payer: Medicare HMO | Source: Home / Self Care | Attending: Emergency Medicine | Admitting: Emergency Medicine

## 2017-01-10 ENCOUNTER — Other Ambulatory Visit: Payer: Self-pay

## 2017-01-10 ENCOUNTER — Emergency Department (HOSPITAL_COMMUNITY): Payer: Medicare HMO

## 2017-01-10 ENCOUNTER — Encounter (HOSPITAL_COMMUNITY): Payer: Self-pay

## 2017-01-10 DIAGNOSIS — J690 Pneumonitis due to inhalation of food and vomit: Secondary | ICD-10-CM | POA: Diagnosis present

## 2017-01-10 DIAGNOSIS — D638 Anemia in other chronic diseases classified elsewhere: Secondary | ICD-10-CM | POA: Diagnosis present

## 2017-01-10 DIAGNOSIS — R404 Transient alteration of awareness: Secondary | ICD-10-CM | POA: Diagnosis not present

## 2017-01-10 DIAGNOSIS — I350 Nonrheumatic aortic (valve) stenosis: Secondary | ICD-10-CM | POA: Diagnosis present

## 2017-01-10 DIAGNOSIS — L97222 Non-pressure chronic ulcer of left calf with fat layer exposed: Secondary | ICD-10-CM | POA: Diagnosis not present

## 2017-01-10 DIAGNOSIS — R059 Cough, unspecified: Secondary | ICD-10-CM

## 2017-01-10 DIAGNOSIS — R05 Cough: Secondary | ICD-10-CM | POA: Diagnosis not present

## 2017-01-10 DIAGNOSIS — N183 Chronic kidney disease, stage 3 (moderate): Secondary | ICD-10-CM

## 2017-01-10 DIAGNOSIS — X58XXXA Exposure to other specified factors, initial encounter: Secondary | ICD-10-CM | POA: Diagnosis present

## 2017-01-10 DIAGNOSIS — N390 Urinary tract infection, site not specified: Secondary | ICD-10-CM | POA: Diagnosis present

## 2017-01-10 DIAGNOSIS — J181 Lobar pneumonia, unspecified organism: Principal | ICD-10-CM

## 2017-01-10 DIAGNOSIS — Z7982 Long term (current) use of aspirin: Secondary | ICD-10-CM | POA: Insufficient documentation

## 2017-01-10 DIAGNOSIS — I361 Nonrheumatic tricuspid (valve) insufficiency: Secondary | ICD-10-CM | POA: Diagnosis not present

## 2017-01-10 DIAGNOSIS — J189 Pneumonia, unspecified organism: Secondary | ICD-10-CM

## 2017-01-10 DIAGNOSIS — E860 Dehydration: Secondary | ICD-10-CM

## 2017-01-10 DIAGNOSIS — Z79899 Other long term (current) drug therapy: Secondary | ICD-10-CM | POA: Insufficient documentation

## 2017-01-10 DIAGNOSIS — E43 Unspecified severe protein-calorie malnutrition: Secondary | ICD-10-CM | POA: Diagnosis present

## 2017-01-10 DIAGNOSIS — S80822A Blister (nonthermal), left lower leg, initial encounter: Secondary | ICD-10-CM | POA: Diagnosis present

## 2017-01-10 DIAGNOSIS — S40021A Contusion of right upper arm, initial encounter: Secondary | ICD-10-CM | POA: Diagnosis present

## 2017-01-10 DIAGNOSIS — N179 Acute kidney failure, unspecified: Secondary | ICD-10-CM | POA: Diagnosis present

## 2017-01-10 DIAGNOSIS — D649 Anemia, unspecified: Secondary | ICD-10-CM | POA: Diagnosis not present

## 2017-01-10 DIAGNOSIS — J90 Pleural effusion, not elsewhere classified: Secondary | ICD-10-CM | POA: Diagnosis not present

## 2017-01-10 DIAGNOSIS — N3000 Acute cystitis without hematuria: Secondary | ICD-10-CM | POA: Diagnosis not present

## 2017-01-10 DIAGNOSIS — E1122 Type 2 diabetes mellitus with diabetic chronic kidney disease: Secondary | ICD-10-CM | POA: Insufficient documentation

## 2017-01-10 DIAGNOSIS — S40022A Contusion of left upper arm, initial encounter: Secondary | ICD-10-CM | POA: Diagnosis present

## 2017-01-10 DIAGNOSIS — E876 Hypokalemia: Secondary | ICD-10-CM | POA: Diagnosis present

## 2017-01-10 DIAGNOSIS — I509 Heart failure, unspecified: Secondary | ICD-10-CM

## 2017-01-10 DIAGNOSIS — Z6822 Body mass index (BMI) 22.0-22.9, adult: Secondary | ICD-10-CM | POA: Diagnosis not present

## 2017-01-10 DIAGNOSIS — I130 Hypertensive heart and chronic kidney disease with heart failure and stage 1 through stage 4 chronic kidney disease, or unspecified chronic kidney disease: Secondary | ICD-10-CM

## 2017-01-10 DIAGNOSIS — G9341 Metabolic encephalopathy: Secondary | ICD-10-CM | POA: Diagnosis present

## 2017-01-10 DIAGNOSIS — Z66 Do not resuscitate: Secondary | ICD-10-CM | POA: Diagnosis present

## 2017-01-10 DIAGNOSIS — L97112 Non-pressure chronic ulcer of right thigh with fat layer exposed: Secondary | ICD-10-CM | POA: Diagnosis not present

## 2017-01-10 DIAGNOSIS — Z7901 Long term (current) use of anticoagulants: Secondary | ICD-10-CM | POA: Insufficient documentation

## 2017-01-10 DIAGNOSIS — A419 Sepsis, unspecified organism: Secondary | ICD-10-CM | POA: Diagnosis present

## 2017-01-10 DIAGNOSIS — I4891 Unspecified atrial fibrillation: Secondary | ICD-10-CM | POA: Diagnosis present

## 2017-01-10 DIAGNOSIS — I959 Hypotension, unspecified: Secondary | ICD-10-CM | POA: Diagnosis present

## 2017-01-10 DIAGNOSIS — S40011A Contusion of right shoulder, initial encounter: Secondary | ICD-10-CM | POA: Diagnosis present

## 2017-01-10 DIAGNOSIS — R531 Weakness: Secondary | ICD-10-CM | POA: Diagnosis not present

## 2017-01-10 DIAGNOSIS — R4182 Altered mental status, unspecified: Secondary | ICD-10-CM | POA: Diagnosis not present

## 2017-01-10 DIAGNOSIS — J8410 Pulmonary fibrosis, unspecified: Secondary | ICD-10-CM | POA: Diagnosis not present

## 2017-01-10 DIAGNOSIS — Z515 Encounter for palliative care: Secondary | ICD-10-CM | POA: Diagnosis present

## 2017-01-10 DIAGNOSIS — E785 Hyperlipidemia, unspecified: Secondary | ICD-10-CM | POA: Diagnosis present

## 2017-01-10 DIAGNOSIS — S81012A Laceration without foreign body, left knee, initial encounter: Secondary | ICD-10-CM | POA: Diagnosis present

## 2017-01-10 DIAGNOSIS — N289 Disorder of kidney and ureter, unspecified: Secondary | ICD-10-CM | POA: Diagnosis not present

## 2017-01-10 DIAGNOSIS — S8012XA Contusion of left lower leg, initial encounter: Secondary | ICD-10-CM | POA: Diagnosis present

## 2017-01-10 DIAGNOSIS — E1151 Type 2 diabetes mellitus with diabetic peripheral angiopathy without gangrene: Secondary | ICD-10-CM | POA: Diagnosis present

## 2017-01-10 DIAGNOSIS — Z4682 Encounter for fitting and adjustment of non-vascular catheter: Secondary | ICD-10-CM | POA: Diagnosis not present

## 2017-01-10 DIAGNOSIS — I872 Venous insufficiency (chronic) (peripheral): Secondary | ICD-10-CM | POA: Diagnosis not present

## 2017-01-10 DIAGNOSIS — I639 Cerebral infarction, unspecified: Secondary | ICD-10-CM | POA: Diagnosis not present

## 2017-01-10 DIAGNOSIS — F0390 Unspecified dementia without behavioral disturbance: Secondary | ICD-10-CM | POA: Diagnosis present

## 2017-01-10 LAB — COMPREHENSIVE METABOLIC PANEL
ALT: 10 U/L — AB (ref 14–54)
AST: 19 U/L (ref 15–41)
Albumin: 2.2 g/dL — ABNORMAL LOW (ref 3.5–5.0)
Alkaline Phosphatase: 61 U/L (ref 38–126)
Anion gap: 7 (ref 5–15)
BILIRUBIN TOTAL: 1.4 mg/dL — AB (ref 0.3–1.2)
BUN: 25 mg/dL — AB (ref 6–20)
CALCIUM: 7.9 mg/dL — AB (ref 8.9–10.3)
CHLORIDE: 106 mmol/L (ref 101–111)
CO2: 28 mmol/L (ref 22–32)
CREATININE: 1.52 mg/dL — AB (ref 0.44–1.00)
GFR, EST AFRICAN AMERICAN: 32 mL/min — AB (ref >60)
GFR, EST NON AFRICAN AMERICAN: 28 mL/min — AB (ref >60)
Glucose, Bld: 95 mg/dL (ref 65–99)
Potassium: 3.1 mmol/L — ABNORMAL LOW (ref 3.5–5.1)
Sodium: 141 mmol/L (ref 135–145)
TOTAL PROTEIN: 5.4 g/dL — AB (ref 6.5–8.1)

## 2017-01-10 LAB — CBC WITH DIFFERENTIAL/PLATELET
BASOS PCT: 0 %
Basophils Absolute: 0 10*3/uL (ref 0.0–0.1)
EOS ABS: 0.1 10*3/uL (ref 0.0–0.7)
Eosinophils Relative: 1 %
HCT: 29.5 % — ABNORMAL LOW (ref 36.0–46.0)
HEMOGLOBIN: 9.8 g/dL — AB (ref 12.0–15.0)
Lymphocytes Relative: 9 %
Lymphs Abs: 1.1 10*3/uL (ref 0.7–4.0)
MCH: 31.1 pg (ref 26.0–34.0)
MCHC: 33.2 g/dL (ref 30.0–36.0)
MCV: 93.7 fL (ref 78.0–100.0)
MONOS PCT: 10 %
Monocytes Absolute: 1.1 10*3/uL — ABNORMAL HIGH (ref 0.1–1.0)
NEUTROS PCT: 80 %
Neutro Abs: 9.1 10*3/uL — ABNORMAL HIGH (ref 1.7–7.7)
PLATELETS: 172 10*3/uL (ref 150–400)
RBC: 3.15 MIL/uL — ABNORMAL LOW (ref 3.87–5.11)
RDW: 16.9 % — ABNORMAL HIGH (ref 11.5–15.5)
WBC: 11.4 10*3/uL — AB (ref 4.0–10.5)

## 2017-01-10 LAB — BRAIN NATRIURETIC PEPTIDE: B NATRIURETIC PEPTIDE 5: 369 pg/mL — AB (ref 0.0–100.0)

## 2017-01-10 LAB — I-STAT CG4 LACTIC ACID, ED: LACTIC ACID, VENOUS: 1.54 mmol/L (ref 0.5–1.9)

## 2017-01-10 MED ORDER — LEVOFLOXACIN 500 MG PO TABS
250.0000 mg | ORAL_TABLET | Freq: Once | ORAL | Status: AC
  Administered 2017-01-10: 250 mg via ORAL
  Filled 2017-01-10: qty 1

## 2017-01-10 NOTE — ED Provider Notes (Signed)
[facility] DEPT Provider Note  CSN: [id number] Arrival date & time: 01/10/17 1518  Chief Complaint(s) Pneumonia  HPI Christina Pittman is a 83 y.o. female with a history of hypertension, diabetes, congestive heart failure on Lasix who presents to the emergency department for assessment given recent diagnosis of pneumonia.  Family reports that she was treated with a full course of antibiotics last week and went to [facility] emergency department 2 days ago due to persistent cough where they noted persistent pneumonia with leukocytosis at 22.  She was placed on Levaquin which she has taken for the past 2 days.  Today home health nurse came by and thought that the patient was a bit confused and reviewed blood work from [place].  She recommended patient come to the hospital for further evaluation and assess need for admission.  Patient did endorse that the patient was mildly confused however is back to baseline.  They report that she has had decreased oral hydration because she refuses she is to drink.  They also report that she has been refusing to take her Lasix from time to time.  They deny any noted fevers.  No vomiting or diarrhea.  The patient denies any chest pain or shortness of breath.  Denies any abdominal pain.  There are no alleviating or aggravating factors.  No other associated symptoms. HPI  Past Medical History Past Medical History:  Diagnosis Date  . Aneurysm of thoracic aorta ([place])   . Aortic stenosis    a. moderate to severe by echo in 03/2011. Vmax 0.78cm^2  . Arthritis   . Aspiration pneumonia ([place])   . Bilateral venous insufficiency   . Chicken pox   . Diabetes mellitus   . Dysphagia   . Fractured pelvis ([place]) 05/14/2011  . Gangrene of foot ([place])   . Heart murmur   . Hemiparesis, right ([place])   . Hyperlipemia   . Hypertension   . Insomnia   . Pituitary macroadenoma ([place])   . Stroke [facility])    feb 2013   Patient Active Problem List   Diagnosis Date Noted  . CHF (congestive heart failure) ([place]) 01/10/2016  . TIA (transient ischemic attack) 03/23/2015  . CKD (chronic kidney disease), stage III ([place]) 03/23/2015  . Acute UTI 03/23/2015  . New onset atrial fibrillation ([place])   . Aortic stenosis   . Eczema 01/04/2015  . GI bleed 12/16/2013  . Acute renal insufficiency 12/16/2013  . Noninfected skin tear of left leg 10/29/2013  . Routine general medical examination at a health care facility 04/01/2013  . Impaired mobility 12/03/2012  . S/P AKA (above knee amputation) ([place]) 12/03/2012  . Skin tear of right forearm without complication 78/29/5621  . Toe pain 05/28/2012  . Hallucinations, visual 02/27/2012  . Allergic rhinitis 01/08/2012  . Cellulitis 12/24/2011  . Blood blister 10/25/2011  . Dysuria 10/10/2011  . Wound of left leg 07/26/2011  . Edema 07/12/2011  . Pituitary macroadenoma ([place]) 05/21/2011  . Aspiration pneumonia ([place]) 04/25/2011  . Aneurysm of thoracic aorta ([place]) 04/25/2011  . Venous insufficiency of both lower extremities 04/09/2011  . Fall at home 04/09/2011  . Hx of arterial ischemic stroke 03/14/2011  . HTN (hypertension) 03/14/2011  . Hx of type 2 diabetes mellitus 03/14/2011  . Insomnia 03/14/2011  . Hemiparesis, right ([place]) 03/14/2011  . HLD (hyperlipidemia) 03/14/2011  . Dysphagia 03/14/2011   Home Medication(s) Prior to Admission medications   Medication Sig Start Date End Date Taking? Authorizing Provider  aspirin EC  81 MG tablet Take 81 mg by mouth daily.    [provider]  carvedilol (COREG) 12.5 MG tablet Take 1 tablet (12.5 mg total) by mouth 2 (two) times daily. 11/04/16   Strader, Fransisco Hertz, PA-C  ELIQUIS 5 MG TABS tablet TAKE 1 TABLET BY MOUTH TWICE DAILY 12/16/16   Hilty, Nadean Corwin, MD  furosemide (LASIX) 20 MG tablet TAKE ONE TABLET BY MOUTH TWICE DAILY AS NEEDED FOR  SWELLING 06/21/16   Midge Minium, MD  NONFORMULARY OR COMPOUNDED ITEM Pt is in need of a Right  AK Socket for her prosthetic. 06/02/13   Midge Minium, MD  ondansetron (ZOFRAN ODT) 4 MG disintegrating tablet 4mg  ODT q4 hours prn nausea/vomit 02/01/16   Brunetta Jeans, PA-C  potassium chloride SA (K-DUR,KLOR-CON) 20 MEQ tablet Take 1 tablet (20 mEq total) by mouth daily. 02/26/16   Strader, Fransisco Hertz, PA-C  simvastatin (ZOCOR) 20 MG tablet TAKE ONE TABLET BY MOUTH ONCE DAILY AT 6PM 02/26/16   Ahmed Prima, [place] M, PA-C  triamcinolone ointment (KENALOG) 0.1 % Apply 1 application topically 2 (two) times daily. 07/08/16 07/08/17  Midge Minium, MD                                                                                                                                    Past Surgical History Past Surgical History:  Procedure Laterality Date  . ABDOMINAL HYSTERECTOMY    . ABOVE KNEE LEG AMPUTATION     Family History Family History  Problem Relation Age of Onset  . Arthritis Mother   . Cancer Father   . Diabetes Sister   . Heart disease Brother        MI  . Diabetes Brother   . Liver disease Brother   . Heart attack Brother   . Clotting disorder Sister        blood clot    Social History Social History   Tobacco Use  . Smoking status: Never Smoker  . Smokeless tobacco: Current User    Types: Snuff  Substance Use Topics  . Alcohol use: No  . Drug use: No   Allergies Betadine [povidone iodine]; Equalyte; Neosporin [neomycin-polymyxin-gramicidin]; and Codeine  Review of Systems Review of Systems All other systems are reviewed and are negative for acute change except as noted in the HPI  Physical Exam Vital Signs  I have reviewed the triage vital signs BP (!) 157/135 (BP Location: Left Arm)   Pulse 72   Resp 18   Ht 5\' 2"  (1.575 m)   Wt 63.5 kg (140 lb)   SpO2 100%   BMI 25.61 kg/m    Family declined getting any additional blood pressure cuffs due to bruising and intolerance.   Vitals:   01/10/17 1541 01/10/17 1905  BP: (!) 157/135   Pulse: (!)  127 72  Resp: (!) 21 18  SpO2: 100% 100%  Physical Exam  Constitutional: She is oriented to person, place, and time. She appears well-developed and well-nourished. No distress.  HENT:  Head: Normocephalic and atraumatic.  Nose: Nose normal.  Eyes: Conjunctivae and EOM are normal. Pupils are equal, round, and reactive to light. Right eye exhibits no discharge. Left eye exhibits no discharge. No scleral icterus.  Neck: Normal range of motion. Neck supple.  Cardiovascular: Normal rate and regular rhythm. Exam reveals no gallop and no friction rub.  No murmur heard. Pulmonary/Chest: Effort normal. No stridor. No respiratory distress. She has wheezes in the right lower field. She has no rales.  Abdominal: Soft. She exhibits no distension. There is no tenderness.  Musculoskeletal: She exhibits no edema or tenderness.       Legs: Neurological: She is alert and oriented to person, place, and time.  Skin: Skin is warm and dry. No rash noted. She is not diaphoretic. No erythema.  Psychiatric: She has a normal mood and affect.  Vitals reviewed.   ED Results and Treatments Labs (all labs ordered are listed, but only abnormal results are displayed) Labs Reviewed  CBC WITH DIFFERENTIAL/PLATELET - Abnormal; Notable for the following components:      Result Value   WBC 11.4 (*)    RBC 3.15 (*)    Hemoglobin 9.8 (*)    HCT 29.5 (*)    RDW 16.9 (*)    Neutro Abs 9.1 (*)    Monocytes Absolute 1.1 (*)    All other components within normal limits  COMPREHENSIVE METABOLIC PANEL - Abnormal; Notable for the following components:   Potassium 3.1 (*)    BUN 25 (*)    Creatinine, Ser 1.52 (*)    Calcium 7.9 (*)    Total Protein 5.4 (*)    Albumin 2.2 (*)    ALT 10 (*)    Total Bilirubin 1.4 (*)    GFR calc non Af Amer 28 (*)    GFR calc Af Amer 32 (*)    All other components within normal limits  BRAIN NATRIURETIC PEPTIDE - Abnormal; Notable for the following components:   B Natriuretic  Peptide 369.0 (*)    All other components within normal limits  I-STAT CG4 LACTIC ACID, ED                                                                                                                         EKG  EKG Interpretation  Date/Time:    Ventricular Rate:    PR Interval:    QRS Duration:   QT Interval:    QTC Calculation:   R Axis:     Text Interpretation:        Radiology Dg Chest 2 View  Result Date: 01/10/2017 CLINICAL DATA:  Weakness.  Cough and congestion. EXAM: CHEST  2 VIEW COMPARISON:  07/11/2016 FINDINGS: Mild cardiac enlargement. Aortic atherosclerosis noted. Moderate bilateral pleural effusions are identified left-greater-than-right. No airspace opacities or interstitial edema. The bones appear diffusely osteopenic.  IMPRESSION: 1. Cardiac enlargement, aortic atherosclerosis, and bilateral pleural effusions compatible with CHF. Electronically Signed   By: Kerby Moors M.D.   On: 01/10/2017 18:26   Pertinent labs & imaging results that were available during my care of the patient were reviewed by me and considered in my medical decision making (see chart for details).  Medications Ordered in ED Medications  levofloxacin (LEVAQUIN) tablet 250 mg (250 mg Oral Given 01/10/17 1852)                                                                                                                                    Procedures Procedures  (including critical care time)  Medical Decision Making / ED Course I have reviewed the nursing notes for this encounter and the patient's prior records (if available in EHR or on provided paperwork).  Clinical Course as of Jan 10 1909  Fri Jan 10, 2017  0932 Patient's labs revealed improved leukocytosis.  She does have acute renal insufficiency likely secondary to dehydration.  Chest x-ray revealed bilateral pleural effusions without focal opacities.  It appears that the patient's pneumonia is being treated effectively with the  Levaquin.  Here she has been able to take oral hydration.  We discussed option of possible admission due to the acute renal insufficiency for hydration versus discharge home.  Given shared decision making with family and patient, they chose to be discharged home and reassured that they will make sure that the patient takes her medication and remains hydrated.  [PC]  3557 Workup also revealed evidence of congestive heart failure.  She is not hypoxic and had no respiratory distress.  Given her inconsistency with oral medication, I emphasized the need for appropriate management using her diuretic.  Family assured that they will make sure that the patient hydrate and also takes her medication.  [PC]  3220 I gave the patient her dose of Levaquin here in the emergency department.  [PC]  2542 The patient is safe for discharge with strict return precautions.   [PC]    Clinical Course User Index [PC] Cardama, Grayce Sessions, MD     Final Clinical Impression(s) / ED Diagnoses Final diagnoses:  Cough  Community acquired pneumonia of left lower lobe of lung ([place])  Dehydration  AKI (acute kidney injury) ([place])    Disposition: Discharge  Condition: Good  I have discussed the results, Dx and Tx plan with the patient and family who expressed understanding and agree(s) with the plan. Discharge instructions discussed at great length. The patient and family were given strict return precautions who verbalized understanding of the instructions. No further questions at time of discharge.    ED Discharge Orders    None       Follow Up: Primary CARE provider   in 3-5 days     This chart was dictated using voice recognition software.  Despite best efforts to proofread,  errors can occur  which can change the documentation meaning.   Fatima Blank, MD 01/10/17 1910

## 2017-01-10 NOTE — ED Notes (Signed)
Pt family refuses blood pressure cuff, automatic and manual

## 2017-01-10 NOTE — ED Notes (Signed)
Bed: WA08 Expected date:  Expected time:  Means of arrival:  Comments: 83 yo f ams, increased wbc

## 2017-01-10 NOTE — Discharge Instructions (Signed)
Please continue taking your antibiotic as prescribed.  Please ensure that you hydrate.

## 2017-01-10 NOTE — ED Triage Notes (Signed)
Pt arrived via [facility] from home, with complaints of Increased WBC from being drawn at [facility] today. Pt is a Full Code but family POA says No Ivs, No BP cuff, NO invasive procedures at all to be done. No vitals were taken therefore but EMS put got a palpable radial pulse of 95. Pt has hx of Afib and Diabetes. Pt has Right BKA and is A&O x1 (herself).  Family is on the way, son which is also PoA. Pt got an IV in her right hand this morning at [facility] where blood work was take but was told to take it out, where it is still bleeding. Family wanted pt to come here, for antibiotics (oral) per EMS.  Radial pulse 95

## 2017-01-10 NOTE — ED Notes (Signed)
pts family will not let me take BP, family states it hurts her and gives her bruises.

## 2017-01-10 NOTE — ED Notes (Addendum)
Patient brought in by EMS. Per patient's family, the patient has been at [facility] with complaints of abdominal pain. Per patient family and patient paperwork, patient urinalysis was completed and her WBC was elevated. Patient has been on oral antibiotics at home for the past 3 days, but is having difficulties taking her pills. Per patient's family, Patient needs "a picc line placed so the home care nurse can give IV antibiotics at home."

## 2017-01-12 ENCOUNTER — Emergency Department (HOSPITAL_COMMUNITY): Payer: Medicare HMO

## 2017-01-12 ENCOUNTER — Other Ambulatory Visit: Payer: Self-pay

## 2017-01-12 ENCOUNTER — Inpatient Hospital Stay (HOSPITAL_COMMUNITY): Payer: Medicare HMO

## 2017-01-12 ENCOUNTER — Encounter (HOSPITAL_COMMUNITY): Payer: Self-pay

## 2017-01-12 ENCOUNTER — Inpatient Hospital Stay (HOSPITAL_COMMUNITY)
Admission: EM | Admit: 2017-01-12 | Discharge: 2017-01-17 | DRG: 871 | Disposition: A | Discharge status: Home / Self Care | Payer: Medicare HMO | Attending: Internal Medicine | Admitting: Internal Medicine

## 2017-01-12 DIAGNOSIS — E1122 Type 2 diabetes mellitus with diabetic chronic kidney disease: Secondary | ICD-10-CM | POA: Diagnosis present

## 2017-01-12 DIAGNOSIS — I959 Hypotension, unspecified: Secondary | ICD-10-CM | POA: Diagnosis present

## 2017-01-12 DIAGNOSIS — A419 Sepsis, unspecified organism: Principal | ICD-10-CM | POA: Diagnosis present

## 2017-01-12 DIAGNOSIS — D649 Anemia, unspecified: Secondary | ICD-10-CM | POA: Diagnosis present

## 2017-01-12 DIAGNOSIS — G9341 Metabolic encephalopathy: Secondary | ICD-10-CM | POA: Diagnosis present

## 2017-01-12 DIAGNOSIS — I872 Venous insufficiency (chronic) (peripheral): Secondary | ICD-10-CM | POA: Diagnosis present

## 2017-01-12 DIAGNOSIS — E860 Dehydration: Secondary | ICD-10-CM | POA: Diagnosis present

## 2017-01-12 DIAGNOSIS — S40022A Contusion of left upper arm, initial encounter: Secondary | ICD-10-CM | POA: Diagnosis present

## 2017-01-12 DIAGNOSIS — Z6822 Body mass index (BMI) 22.0-22.9, adult: Secondary | ICD-10-CM | POA: Diagnosis not present

## 2017-01-12 DIAGNOSIS — Z833 Family history of diabetes mellitus: Secondary | ICD-10-CM

## 2017-01-12 DIAGNOSIS — Z452 Encounter for adjustment and management of vascular access device: Secondary | ICD-10-CM

## 2017-01-12 DIAGNOSIS — Z66 Do not resuscitate: Secondary | ICD-10-CM | POA: Diagnosis present

## 2017-01-12 DIAGNOSIS — S40011A Contusion of right shoulder, initial encounter: Secondary | ICD-10-CM | POA: Diagnosis present

## 2017-01-12 DIAGNOSIS — S81012A Laceration without foreign body, left knee, initial encounter: Secondary | ICD-10-CM | POA: Diagnosis present

## 2017-01-12 DIAGNOSIS — N183 Chronic kidney disease, stage 3 (moderate): Secondary | ICD-10-CM | POA: Diagnosis present

## 2017-01-12 DIAGNOSIS — Z90710 Acquired absence of both cervix and uterus: Secondary | ICD-10-CM

## 2017-01-12 DIAGNOSIS — E876 Hypokalemia: Secondary | ICD-10-CM | POA: Diagnosis present

## 2017-01-12 DIAGNOSIS — E1151 Type 2 diabetes mellitus with diabetic peripheral angiopathy without gangrene: Secondary | ICD-10-CM | POA: Diagnosis present

## 2017-01-12 DIAGNOSIS — Z79899 Other long term (current) drug therapy: Secondary | ICD-10-CM

## 2017-01-12 DIAGNOSIS — S80822A Blister (nonthermal), left lower leg, initial encounter: Secondary | ICD-10-CM | POA: Diagnosis present

## 2017-01-12 DIAGNOSIS — E43 Unspecified severe protein-calorie malnutrition: Secondary | ICD-10-CM | POA: Diagnosis present

## 2017-01-12 DIAGNOSIS — I4891 Unspecified atrial fibrillation: Secondary | ICD-10-CM | POA: Diagnosis present

## 2017-01-12 DIAGNOSIS — R531 Weakness: Secondary | ICD-10-CM

## 2017-01-12 DIAGNOSIS — F0390 Unspecified dementia without behavioral disturbance: Secondary | ICD-10-CM | POA: Diagnosis present

## 2017-01-12 DIAGNOSIS — N3000 Acute cystitis without hematuria: Secondary | ICD-10-CM

## 2017-01-12 DIAGNOSIS — Z89511 Acquired absence of right leg below knee: Secondary | ICD-10-CM

## 2017-01-12 DIAGNOSIS — R9431 Abnormal electrocardiogram [ECG] [EKG]: Secondary | ICD-10-CM | POA: Diagnosis present

## 2017-01-12 DIAGNOSIS — I350 Nonrheumatic aortic (valve) stenosis: Secondary | ICD-10-CM | POA: Diagnosis present

## 2017-01-12 DIAGNOSIS — J690 Pneumonitis due to inhalation of food and vomit: Secondary | ICD-10-CM | POA: Diagnosis present

## 2017-01-12 DIAGNOSIS — Z885 Allergy status to narcotic agent status: Secondary | ICD-10-CM

## 2017-01-12 DIAGNOSIS — N179 Acute kidney failure, unspecified: Secondary | ICD-10-CM | POA: Diagnosis present

## 2017-01-12 DIAGNOSIS — D638 Anemia in other chronic diseases classified elsewhere: Secondary | ICD-10-CM | POA: Diagnosis present

## 2017-01-12 DIAGNOSIS — Z8249 Family history of ischemic heart disease and other diseases of the circulatory system: Secondary | ICD-10-CM

## 2017-01-12 DIAGNOSIS — Z7901 Long term (current) use of anticoagulants: Secondary | ICD-10-CM

## 2017-01-12 DIAGNOSIS — E785 Hyperlipidemia, unspecified: Secondary | ICD-10-CM | POA: Diagnosis present

## 2017-01-12 DIAGNOSIS — N289 Disorder of kidney and ureter, unspecified: Secondary | ICD-10-CM

## 2017-01-12 DIAGNOSIS — N390 Urinary tract infection, site not specified: Secondary | ICD-10-CM | POA: Diagnosis present

## 2017-01-12 DIAGNOSIS — S8012XA Contusion of left lower leg, initial encounter: Secondary | ICD-10-CM | POA: Diagnosis present

## 2017-01-12 DIAGNOSIS — F17290 Nicotine dependence, other tobacco product, uncomplicated: Secondary | ICD-10-CM | POA: Diagnosis present

## 2017-01-12 DIAGNOSIS — Z8639 Personal history of other endocrine, nutritional and metabolic disease: Secondary | ICD-10-CM

## 2017-01-12 DIAGNOSIS — S40021A Contusion of right upper arm, initial encounter: Secondary | ICD-10-CM | POA: Diagnosis present

## 2017-01-12 DIAGNOSIS — Z515 Encounter for palliative care: Secondary | ICD-10-CM | POA: Diagnosis present

## 2017-01-12 DIAGNOSIS — Z881 Allergy status to other antibiotic agents status: Secondary | ICD-10-CM

## 2017-01-12 DIAGNOSIS — Z7982 Long term (current) use of aspirin: Secondary | ICD-10-CM

## 2017-01-12 DIAGNOSIS — Z888 Allergy status to other drugs, medicaments and biological substances status: Secondary | ICD-10-CM

## 2017-01-12 DIAGNOSIS — X58XXXA Exposure to other specified factors, initial encounter: Secondary | ICD-10-CM | POA: Diagnosis present

## 2017-01-12 DIAGNOSIS — I129 Hypertensive chronic kidney disease with stage 1 through stage 4 chronic kidney disease, or unspecified chronic kidney disease: Secondary | ICD-10-CM | POA: Diagnosis present

## 2017-01-12 DIAGNOSIS — Z8673 Personal history of transient ischemic attack (TIA), and cerebral infarction without residual deficits: Secondary | ICD-10-CM

## 2017-01-12 DIAGNOSIS — I361 Nonrheumatic tricuspid (valve) insufficiency: Secondary | ICD-10-CM | POA: Diagnosis not present

## 2017-01-12 LAB — CBC WITH DIFFERENTIAL/PLATELET
Basophils Absolute: 0 10*3/uL (ref 0.0–0.1)
Basophils Relative: 0 %
EOS ABS: 0.1 10*3/uL (ref 0.0–0.7)
Eosinophils Relative: 1 %
HCT: 29.1 % — ABNORMAL LOW (ref 36.0–46.0)
HEMOGLOBIN: 9.7 g/dL — AB (ref 12.0–15.0)
LYMPHS ABS: 1.1 10*3/uL (ref 0.7–4.0)
LYMPHS PCT: 12 %
MCH: 31.1 pg (ref 26.0–34.0)
MCHC: 33.3 g/dL (ref 30.0–36.0)
MCV: 93.3 fL (ref 78.0–100.0)
Monocytes Absolute: 0.8 10*3/uL (ref 0.1–1.0)
Monocytes Relative: 9 %
NEUTROS PCT: 78 %
Neutro Abs: 7.3 10*3/uL (ref 1.7–7.7)
Platelets: 212 10*3/uL (ref 150–400)
RBC: 3.12 MIL/uL — AB (ref 3.87–5.11)
RDW: 16.8 % — ABNORMAL HIGH (ref 11.5–15.5)
WBC: 9.2 10*3/uL (ref 4.0–10.5)

## 2017-01-12 LAB — URINALYSIS, ROUTINE W REFLEX MICROSCOPIC
Bilirubin Urine: NEGATIVE
GLUCOSE, UA: NEGATIVE mg/dL
Hgb urine dipstick: NEGATIVE
Ketones, ur: NEGATIVE mg/dL
Nitrite: NEGATIVE
PROTEIN: NEGATIVE mg/dL
Specific Gravity, Urine: 1.012 (ref 1.005–1.030)
pH: 5 (ref 5.0–8.0)

## 2017-01-12 LAB — COMPREHENSIVE METABOLIC PANEL
ALT: 10 U/L — ABNORMAL LOW (ref 14–54)
AST: 28 U/L (ref 15–41)
Albumin: 2.2 g/dL — ABNORMAL LOW (ref 3.5–5.0)
Alkaline Phosphatase: 52 U/L (ref 38–126)
Anion gap: 10 (ref 5–15)
BUN: 29 mg/dL — ABNORMAL HIGH (ref 6–20)
CHLORIDE: 102 mmol/L (ref 101–111)
CO2: 26 mmol/L (ref 22–32)
Calcium: 7.7 mg/dL — ABNORMAL LOW (ref 8.9–10.3)
Creatinine, Ser: 1.47 mg/dL — ABNORMAL HIGH (ref 0.44–1.00)
GFR, EST AFRICAN AMERICAN: 34 mL/min — AB (ref >60)
GFR, EST NON AFRICAN AMERICAN: 29 mL/min — AB (ref >60)
Glucose, Bld: 88 mg/dL (ref 65–99)
POTASSIUM: 3.2 mmol/L — AB (ref 3.5–5.1)
Sodium: 138 mmol/L (ref 135–145)
Total Bilirubin: 1.2 mg/dL (ref 0.3–1.2)
Total Protein: 5.2 g/dL — ABNORMAL LOW (ref 6.5–8.1)

## 2017-01-12 LAB — I-STAT CG4 LACTIC ACID, ED: LACTIC ACID, VENOUS: 2.19 mmol/L — AB (ref 0.5–1.9)

## 2017-01-12 LAB — PROTIME-INR
INR: 3.96
PROTHROMBIN TIME: 38.4 s — AB (ref 11.4–15.2)

## 2017-01-12 MED ORDER — APIXABAN 5 MG PO TABS
5.0000 mg | ORAL_TABLET | Freq: Two times a day (BID) | ORAL | Status: DC
  Administered 2017-01-13: 5 mg via ORAL
  Filled 2017-01-12 (×2): qty 1

## 2017-01-12 MED ORDER — PIPERACILLIN-TAZOBACTAM 3.375 G IVPB 30 MIN
3.3750 g | Freq: Once | INTRAVENOUS | Status: AC
  Administered 2017-01-12: 3.375 g via INTRAVENOUS
  Filled 2017-01-12: qty 50

## 2017-01-12 MED ORDER — HALOPERIDOL LACTATE 5 MG/ML IJ SOLN
INTRAMUSCULAR | Status: AC
  Filled 2017-01-12: qty 1

## 2017-01-12 MED ORDER — SODIUM CHLORIDE 0.9 % IV BOLUS (SEPSIS)
1000.0000 mL | Freq: Once | INTRAVENOUS | Status: AC
  Administered 2017-01-12: 1000 mL via INTRAVENOUS

## 2017-01-12 MED ORDER — ASPIRIN EC 81 MG PO TBEC
81.0000 mg | DELAYED_RELEASE_TABLET | Freq: Every day | ORAL | Status: DC
  Filled 2017-01-12: qty 1

## 2017-01-12 MED ORDER — DEXTROSE 5 % IV SOLN
1.0000 g | INTRAVENOUS | Status: DC
  Filled 2017-01-12: qty 10

## 2017-01-12 MED ORDER — FUROSEMIDE 20 MG PO TABS: 20.0000 mg | ORAL_TABLET | Freq: Every day | ORAL | Status: DC

## 2017-01-12 MED ORDER — MORPHINE SULFATE (PF) 4 MG/ML IV SOLN
INTRAVENOUS | Status: AC
  Filled 2017-01-12: qty 1

## 2017-01-12 MED ORDER — ALBUMIN HUMAN 5 % IV SOLN
12.5000 g | Freq: Once | INTRAVENOUS | Status: AC
  Administered 2017-01-12: 12.5 g via INTRAVENOUS
  Filled 2017-01-12: qty 250

## 2017-01-12 MED ORDER — POTASSIUM CHLORIDE CRYS ER 20 MEQ PO TBCR: 20.0000 meq | EXTENDED_RELEASE_TABLET | Freq: Every day | ORAL | Status: DC

## 2017-01-12 MED ORDER — HALOPERIDOL LACTATE 5 MG/ML IJ SOLN
3.0000 mg | Freq: Once | INTRAMUSCULAR | Status: AC
  Administered 2017-01-12: 3 mg via INTRAMUSCULAR

## 2017-01-12 MED ORDER — CYPROHEPTADINE HCL 4 MG PO TABS
4.0000 mg | ORAL_TABLET | Freq: Three times a day (TID) | ORAL | Status: DC
  Filled 2017-01-12: qty 1

## 2017-01-12 MED ORDER — CARVEDILOL 12.5 MG PO TABS: 12.5000 mg | ORAL_TABLET | Freq: Two times a day (BID) | ORAL | Status: DC

## 2017-01-12 MED ORDER — MORPHINE SULFATE (PF) 4 MG/ML IV SOLN
2.0000 mg | Freq: Once | INTRAVENOUS | Status: AC
  Administered 2017-01-12: 2 mg via INTRAMUSCULAR

## 2017-01-12 MED ORDER — ACETAMINOPHEN 325 MG PO TABS: 650.0000 mg | ORAL_TABLET | Freq: Four times a day (QID) | ORAL | Status: DC | PRN

## 2017-01-12 MED ORDER — SIMVASTATIN 10 MG PO TABS: 20.0000 mg | ORAL_TABLET | Freq: Every day | ORAL | Status: DC

## 2017-01-12 MED ORDER — PRO-STAT SUGAR FREE PO LIQD
30.0000 mL | Freq: Two times a day (BID) | ORAL | Status: DC
  Administered 2017-01-13: 30 mL via ORAL
  Filled 2017-01-12 (×2): qty 30

## 2017-01-12 MED ORDER — ACETAMINOPHEN 650 MG RE SUPP: 650.0000 mg | Freq: Four times a day (QID) | RECTAL | Status: DC | PRN

## 2017-01-12 MED ORDER — VANCOMYCIN HCL IN DEXTROSE 1-5 GM/200ML-% IV SOLN
1000.0000 mg | Freq: Once | INTRAVENOUS | Status: AC
  Administered 2017-01-12: 1000 mg via INTRAVENOUS
  Filled 2017-01-12: qty 200

## 2017-01-12 MED ORDER — HALOPERIDOL LACTATE 5 MG/ML IJ SOLN
2.0000 mg | Freq: Once | INTRAMUSCULAR | Status: AC
  Administered 2017-01-12: 2 mg via INTRAMUSCULAR

## 2017-01-12 MED ORDER — MORPHINE SULFATE (PF) 4 MG/ML IV SOLN
1.0000 mg | Freq: Once | INTRAVENOUS | Status: DC
  Administered 2017-01-12: 1 mg via INTRAMUSCULAR
  Filled 2017-01-12: qty 1

## 2017-01-12 NOTE — Progress Notes (Signed)
Assessed pt, spoke with family, Tim Therapist, sports and Dr Maudie Mercury.  Pt with small vessels, extensive severe bruising BUE. Infiltrates BUE.  Multiple skin tears with dressings.  Family at bedside does not want anything peripherally, PICC or PIV,even blood pressures.  Pt uncooperative and restless, pulling away with any tactile stimuli, unable to follow commands.  Recommend central access due to the status of her extremities, multiple infiltrates, bruising, unable to maintain sterile field, family request and fragile, small vasculature.  Order from Dr Maudie Mercury to d/c PICC order.

## 2017-01-12 NOTE — ED Notes (Signed)
After having received instruction from Dr. Rogene Houston, we have re-established IV in left upper arm with u/s-guidance. Fluids/antibiotics resumed per his prior order also per his instruction.

## 2017-01-12 NOTE — Progress Notes (Signed)
Attempted to contact RN re PICC.  Will try again later.

## 2017-01-12 NOTE — ED Notes (Signed)
pruewick in place for urine specimen

## 2017-01-12 NOTE — ED Triage Notes (Signed)
She arrives from home, awake, alert and in no distress. Per EMS; they were instructed by pt's. Family not to perform anything physical to pt., including v.s., therefore they did not perform them. I do perform manual b/p. She has widespread echymoses to an astonishing degree. She has no real complaint at this time. She has a recent d/c paper which indicates she was dx with pneumonia and was rx with Levaquin. Her speech is clear and she is oriented to her situation.

## 2017-01-12 NOTE — ED Notes (Signed)
Christina Pittman, our P.I.C.C. Nurse examines pt. And calls and speaks with Dr. Maudie Mercury regarding IV access. Dr. Maudie Mercury states he will speak with our E.D.P. To have them possibly start a central line.

## 2017-01-12 NOTE — ED Notes (Signed)
Attempted manual BP and pt pulled arm away and was screaming. Unable to obtain BP at time.

## 2017-01-12 NOTE — ED Provider Notes (Signed)
[facility] DEPT Provider Note   CSN: [id number] Arrival date & time: 01/12/17  8676     History   Chief Complaint Chief Complaint  Patient presents with  . Weakness    HPI Christina Pittman is a 83 y.o. female.  Patient brought in by family members and EMS.  Accompanied by family members.  Patient just recently seen December 14.  Patient with recent admission to [facility].  Treated for pneumonia.  Patient on Levaquin.  Evaluation on the 14th showed no significant findings.  Blood pressure recorded is normal at that time.  Family members state patient continues not to want to eat or drink is extremely weak.  Patient is a full code.  Patient is on blood pressure medicines and on blood thinners for atrial fib.  Patient has marked bruising to her arms.  Initial vital signs here patient was hypothermic and tachycardic.  Meeting criteria for sepsis.  Sepsis protocol initiated.      Past Medical History:  Diagnosis Date  . Aneurysm of thoracic aorta ([place])   . Aortic stenosis    a. moderate to severe by echo in 03/2011. Vmax 0.78cm^2  . Arthritis   . Aspiration pneumonia ([place])   . Bilateral venous insufficiency   . Chicken pox   . Diabetes mellitus   . Dysphagia   . Fractured pelvis ([place]) 05/14/2011  . Gangrene of foot ([place])   . Heart murmur   . Hemiparesis, right ([place])   . Hyperlipemia   . Hypertension   . Insomnia   . Pituitary macroadenoma ([place])   . Stroke [facility])    feb 2013    Patient Active Problem List   Diagnosis Date Noted  . Hypotension 01/12/2017  . Anemia 01/12/2017  . Hypokalemia 01/12/2017  . Severe protein-calorie malnutrition ([place]) 01/12/2017  . CHF (congestive heart failure) ([place]) 01/10/2016  . TIA (transient ischemic attack) 03/23/2015  . CKD (chronic kidney disease), stage III ([place]) 03/23/2015  . UTI (urinary tract infection) 03/23/2015  . New onset atrial fibrillation ([place])   . Aortic stenosis   . Eczema  01/04/2015  . GI bleed 12/16/2013  . Acute renal insufficiency 12/16/2013  . Noninfected skin tear of left leg 10/29/2013  . Routine general medical examination at a health care facility 04/01/2013  . Impaired mobility 12/03/2012  . S/P AKA (above knee amputation) ([place]) 12/03/2012  . Skin tear of right forearm without complication 19/50/9326  . Toe pain 05/28/2012  . Hallucinations, visual 02/27/2012  . Allergic rhinitis 01/08/2012  . Cellulitis 12/24/2011  . Blood blister 10/25/2011  . Dysuria 10/10/2011  . Wound of left leg 07/26/2011  . Edema 07/12/2011  . Pituitary macroadenoma ([place]) 05/21/2011  . Aspiration pneumonia ([place]) 04/25/2011  . Aneurysm of thoracic aorta ([place]) 04/25/2011  . Venous insufficiency of both lower extremities 04/09/2011  . Fall at home 04/09/2011  . Hx of arterial ischemic stroke 03/14/2011  . HTN (hypertension) 03/14/2011  . Hx of type 2 diabetes mellitus 03/14/2011  . Insomnia 03/14/2011  . Hemiparesis, right ([place]) 03/14/2011  . HLD (hyperlipidemia) 03/14/2011  . Dysphagia 03/14/2011    Past Surgical History:  Procedure Laterality Date  . ABDOMINAL HYSTERECTOMY    . ABOVE KNEE LEG AMPUTATION      OB History    No data available       Home Medications    Prior to Admission medications   Medication Sig Start Date End Date Taking? Authorizing Provider  aspirin EC 81 MG  tablet Take 81 mg by mouth daily.   Yes [provider]  AZO-CRANBERRY PO Take 1 tablet by mouth daily.   Yes [provider]  carvedilol (COREG) 12.5 MG tablet Take 1 tablet (12.5 mg total) by mouth 2 (two) times daily. 11/04/16  Yes Strader, [place], PA-C  ELIQUIS 5 MG TABS tablet TAKE 1 TABLET BY MOUTH TWICE DAILY 12/16/16  Yes Hilty, Nadean Corwin, MD  furosemide (LASIX) 20 MG tablet TAKE ONE TABLET BY MOUTH TWICE DAILY AS NEEDED FOR  SWELLING 06/21/16  Yes Midge Minium, MD  levofloxacin (LEVAQUIN) 250 MG tablet Take 250 mg by mouth daily. 01/09/17   Yes [provider]  ondansetron (ZOFRAN ODT) 4 MG disintegrating tablet '4mg'$  ODT q4 hours prn nausea/vomit 02/01/16  Yes Brunetta Jeans, PA-C  potassium chloride SA (K-DUR,KLOR-CON) 20 MEQ tablet Take 1 tablet (20 mEq total) by mouth daily. 02/26/16  Yes Strader, [place] M, PA-C  simvastatin (ZOCOR) 20 MG tablet TAKE ONE TABLET BY MOUTH ONCE DAILY AT 6PM 02/26/16  Yes Strader, [place] M, PA-C  triamcinolone ointment (KENALOG) 0.1 % Apply 1 application topically 2 (two) times daily. 07/08/16 07/08/17 Yes Midge Minium, MD  cyproheptadine (PERIACTIN) 2 MG/5ML syrup Take 10 mLs by mouth 3 (three) times daily. 01/08/17   [provider]  Guaifenesin-Codeine (GUAIFENESIN AC PO) Take 1 tablet by mouth daily.    [provider]    Family History Family History  Problem Relation Age of Onset  . Arthritis Mother   . Cancer Father   . Diabetes Sister   . Heart disease Brother        MI  . Diabetes Brother   . Liver disease Brother   . Heart attack Brother   . Clotting disorder Sister        blood clot    Social History Social History   Tobacco Use  . Smoking status: Never Smoker  . Smokeless tobacco: Current User    Types: Snuff  Substance Use Topics  . Alcohol use: No  . Drug use: No     Allergies   Betadine [povidone iodine]; Equalyte; Neosporin [neomycin-polymyxin-gramicidin]; and Codeine   Review of Systems Review of Systems  Unable to perform ROS: Dementia     Physical Exam Updated Vital Signs BP 90/68 (BP Location: Right Arm)   Pulse 73   Temp (!) 96.5 F (35.8 C) (Oral)   Resp 19   SpO2 99%   Physical Exam  Constitutional:  Patient very elderly.  Mucous membranes dry.  HENT:  Head: Normocephalic and atraumatic.  Mucous membranes dry.  Eyes: Pupils are equal, round, and reactive to light.  Neck: Neck supple.  Cardiovascular:  Tachycardic irregular rhythm  Pulmonary/Chest: Effort normal and breath sounds normal. No  respiratory distress. She has no wheezes.  Abdominal: Soft. Bowel sounds are normal. There is no tenderness.  Musculoskeletal: She exhibits edema.  Right leg below-knee amputation.  Left leg with edema swelling some slight erythema.  Marked ecchymosis to both upper extremities.  Neurological: She is alert.  Patient not very verbal.  Will follow some commands.  Patient is alert.  Patient not oriented.  Skin: Skin is warm.  Nursing note and vitals reviewed.    ED Treatments / Results  Labs (all labs ordered are listed, but only abnormal results are displayed) Labs Reviewed  COMPREHENSIVE METABOLIC PANEL - Abnormal; Notable for the following components:      Result Value   Potassium 3.2 (*)  BUN 29 (*)    Creatinine, Ser 1.47 (*)    Calcium 7.7 (*)    Total Protein 5.2 (*)    Albumin 2.2 (*)    ALT 10 (*)    GFR calc non Af Amer 29 (*)    GFR calc Af Amer 34 (*)    All other components within normal limits  CBC WITH DIFFERENTIAL/PLATELET - Abnormal; Notable for the following components:   RBC 3.12 (*)    Hemoglobin 9.7 (*)    HCT 29.1 (*)    RDW 16.8 (*)    All other components within normal limits  URINALYSIS, ROUTINE W REFLEX MICROSCOPIC - Abnormal; Notable for the following components:   APPearance HAZY (*)    Leukocytes, UA MODERATE (*)    Bacteria, UA RARE (*)    Squamous Epithelial / LPF 0-5 (*)    All other components within normal limits  PROTIME-INR - Abnormal; Notable for the following components:   Prothrombin Time 38.4 (*)    All other components within normal limits  I-STAT CG4 LACTIC ACID, ED - Abnormal; Notable for the following components:   Lactic Acid, Venous 2.19 (*)    All other components within normal limits  CULTURE, BLOOD (ROUTINE X 2)  CULTURE, BLOOD (ROUTINE X 2)  URINE CULTURE  TROPONIN I  TROPONIN I  TROPONIN I  I-STAT CG4 LACTIC ACID, ED    EKG  EKG Interpretation  Date/Time:  Sunday January 12 2017 10:02:34 EST Ventricular  Rate:  76 PR Interval:    QRS Duration: 158 QT Interval:  452 QTC Calculation: 509 R Axis:   -20 Text Interpretation:  Atrial fibrillation Multiple ventricular premature complexes Left bundle branch block No significant change since last tracing Confirmed by Fredia Sorrow [phone]) on 01/12/2017 11:02:06 AM       Radiology Dg Chest 2 View  Result Date: 01/10/2017 CLINICAL DATA:  Weakness.  Cough and congestion. EXAM: CHEST  2 VIEW COMPARISON:  07/11/2016 FINDINGS: Mild cardiac enlargement. Aortic atherosclerosis noted. Moderate bilateral pleural effusions are identified left-greater-than-right. No airspace opacities or interstitial edema. The bones appear diffusely osteopenic. IMPRESSION: 1. Cardiac enlargement, aortic atherosclerosis, and bilateral pleural effusions compatible with CHF. Electronically Signed   By: Kerby Moors M.D.   On: 01/10/2017 18:26   Ct Head Wo Contrast  Result Date: 01/12/2017 CLINICAL DATA:  Widespread ecchymosis. EXAM: CT HEAD WITHOUT CONTRAST TECHNIQUE: Contiguous axial images were obtained from the base of the skull through the vertex without intravenous contrast. COMPARISON:  07/11/2016 FINDINGS: Brain: No evidence of acute infarction, hemorrhage, hydrocephalus, extra-axial collection or mass lesion/mass effect. Stable old right MCA territory infarct, left parietal infarct, old bilateral basal ganglia lacunar infarcts and old left corona radiata lacunar infarct. Marked brain parenchymal volume loss and deep white matter microangiopathy. Stable right PE territory mass extending to the right cavernous sinus. Vascular: Advanced calcific atherosclerotic disease of the intracranial arteries. Skull: Normal. Negative for fracture or focal lesion. Sinuses/Orbits: No acute finding. Other: None. IMPRESSION: No acute intracranial abnormality. Marked parenchyma atrophy and chronic microvascular disease. Stable sequela of bilateral cerebellar infarcts. Stable pituitary mass.  Electronically Signed   By: Fidela Salisbury M.D.   On: 01/12/2017 13:31   Dg Chest Port 1 View  Result Date: 01/12/2017 CLINICAL DATA:  Weakness EXAM: PORTABLE CHEST 1 VIEW COMPARISON:  01/10/2017 FINDINGS: Heart remains enlarged. Low volumes. Diffuse interstitial prominence. Blunting of both costophrenic angles is stable. Bibasilar atelectasis. IMPRESSION: Stable interstitial prominence. Cardiomegaly. Stable small pleural effusions. Electronically  Signed   By: Marybelle Killings M.D.   On: 01/12/2017 11:13    Procedures Procedures (including critical care time)  CRITICAL CARE Performed by: Fredia Sorrow Total critical care time: 30 minutes Critical care time was exclusive of separately billable procedures and treating other patients. Critical care was necessary to treat or prevent imminent or life-threatening deterioration. Critical care was time spent personally by me on the following activities: development of treatment plan with patient and/or surrogate as well as nursing, discussions with consultants, evaluation of patient's response to treatment, examination of patient, obtaining history from patient or surrogate, ordering and performing treatments and interventions, ordering and review of laboratory studies, ordering and review of radiographic studies, pulse oximetry and re-evaluation of patient's condition.   Medications Ordered in ED Medications  feeding supplement (PRO-STAT SUGAR FREE 64) liquid 30 mL (not administered)  sodium chloride 0.9 % bolus 1,000 mL (0 mLs Intravenous Stopped 01/12/17 1137)    And  sodium chloride 0.9 % bolus 1,000 mL (1,000 mLs Intravenous New Bag/Given 01/12/17 1524)  piperacillin-tazobactam (ZOSYN) IVPB 3.375 g (3.375 g Intravenous New Bag/Given 01/12/17 1053)  vancomycin (VANCOCIN) IVPB 1000 mg/200 mL premix (1,000 mg Intravenous New Bag/Given 01/12/17 1053)     Initial Impression / Assessment and Plan / ED Course  I have reviewed the triage vital  signs and the nursing notes.  Pertinent labs & imaging results that were available during my care of the patient were reviewed by me and considered in my medical decision making (see chart for details).    Patient upon arrival met criteria for sepsis.  Patient started on sepsis protocol.  Patient given the 2 L of fluid based on weight.  Patient received 1 L and then we lost IV.  Another IV eventually started patient got another liter patient's blood pressure improving after the second liter.  Now at systolic of 90.  Heart rate improved quickly with the first liter of fluid.  Heart rates now in 73 range.  Cardiac rhythm is atrial fib.  Patient's temp is improved from 95.8 up to 96.5.  The patient felt warm to the touch.  These were rectal temps.  Oxygen saturations on room air is been excellent they have been in the mid to upper 90s.   Workup here showed a normal white blood cell count.  Improvement in her renal function even though she looked clinically dehydrated from just 2 days ago.  CT of head without any acute findings chest x-ray read as normal.  Not clear why patient requires blood pressure medicine or why she is necessarily on the blood thinner.  I know his for atrial fib but it may be too great of a risk for her.  Patient will probably require PICC line.  Consulted to hospitalist they will admit to the stepdown unit.  Patient did receive broad-spectrum antibiotics of Zosyn and vancomycin.  She had blood cultures and urine culture.  Urine was negative no signs of urinary tract infection.  Consideration for nursing home placement should be part of the eventual disposition plan.  Final Clinical Impressions(s) / ED Diagnoses   Final diagnoses:  Weakness  Dehydration  Hypotension, unspecified hypotension type    ED Discharge Orders    None       Fredia Sorrow, MD 01/12/17 [id number]

## 2017-01-12 NOTE — ED Notes (Signed)
I found her IV to be infiltrated--infusion stopped; and I have asked my colleague to start a 2nd U/S-guided IV. Her sons are at bedside. Pt. Remains in no distress.

## 2017-01-12 NOTE — H&P (Signed)
TRH H&P   Patient Demographics:    Waylon Koffler, is a 83 y.o. female  MRN: [id number]   DOB - 09/01/1919  Admit Date - 01/12/2017  Outpatient Primary MD for the patient is Patient, No Pcp Per  Referring MD/NP/PA: Aundria Rud  Outpatient Specialists:   Patient coming from: home  Chief Complaint  Patient presents with  . Weakness      HPI:    Malayasia Mirkin  is a 83 y.o. female, w Hypertension, Hyperlipidemia, Dm2, PVD s/p r BKA, CVA, pituitary macroadenoma, AS (moderate to severe Valve area 0.85, in 03/15/2011) apparently was weak , having confusion at home over the past several days according to her son.   In ED,  ua => wbc 6-30, rbc 0-5  LA 2.19  INR 3.96  Wbc 9.2, Hgb 9.7,Plt 212 Na 138,K 3.2,  Bun 29, Creatinine 1.47 Alb 2.2 Ast 28, Alt 10 T. Bili 1.2   CT brain  IMPRESSION: No acute intracranial abnormality.  Marked parenchyma atrophy and chronic microvascular disease.  Stable sequela of bilateral cerebellar infarcts.  Stable pituitary mass.   CXR IMPRESSION: Stable interstitial prominence.  Cardiomegaly.  Stable small pleural effusions.  Pt will be admitted for weakness most likely secondary to UTI,Hypokalemia, and mild renal insufficiency and prior CVA       Review of systems:    In addition to the HPI above, No Fever-chills, No Headache, No changes with Vision or hearing, No problems swallowing food or Liquids, No Chest pain, Cough or Shortness of Breath, No Abdominal pain, No Nausea or Vommitting, Bowel movements are regular, No Blood in stool or Urine, No dysuria, No new skin rashes or bruises, No new joints pains-aches,  No new weakness, tingling, numbness in any extremity, No recent weight gain or loss, No polyuria, polydypsia or polyphagia, No significant Mental Stressors.  A full 10 point Review of Systems was  done, except as stated above, all other Review of Systems were negative.   With Past History of the following :    Past Medical History:  Diagnosis Date  . Aneurysm of thoracic aorta ([place])   . Aortic stenosis    a. moderate to severe by echo in 03/2011. Vmax 0.78cm^2  . Arthritis   . Aspiration pneumonia ([place])   . Bilateral venous insufficiency   . Chicken pox   . Diabetes mellitus   . Dysphagia   . Fractured pelvis ([place]) 05/14/2011  . Gangrene of foot ([place])   . Heart murmur   . Hemiparesis, right ([place])   . Hyperlipemia   . Hypertension   . Insomnia   . Pituitary macroadenoma ([place])   . Stroke [facility])    feb 2013      Past Surgical History:  Procedure Laterality Date  . ABDOMINAL HYSTERECTOMY    . ABOVE KNEE LEG AMPUTATION        Social History:  Social History   Tobacco Use  . Smoking status: Never Smoker  . Smokeless tobacco: Current User    Types: Snuff  Substance Use Topics  . Alcohol use: No     Lives - at home  Mobility - doesn't walk   Family History :     Family History  Problem Relation Age of Onset  . Arthritis Mother   . Cancer Father   . Diabetes Sister   . Heart disease Brother        MI  . Diabetes Brother   . Liver disease Brother   . Heart attack Brother   . Clotting disorder Sister        blood clot      Home Medications:   Prior to Admission medications   Medication Sig Start Date End Date Taking? Authorizing Provider  aspirin EC 81 MG tablet Take 81 mg by mouth daily.   Yes [provider]  AZO-CRANBERRY PO Take 1 tablet by mouth daily.   Yes [provider]  carvedilol (COREG) 12.5 MG tablet Take 1 tablet (12.5 mg total) by mouth 2 (two) times daily. 11/04/16  Yes Strader, [place], PA-C  ELIQUIS 5 MG TABS tablet TAKE 1 TABLET BY MOUTH TWICE DAILY 12/16/16  Yes Hilty, Nadean Corwin, MD  furosemide (LASIX) 20 MG tablet TAKE ONE TABLET BY MOUTH TWICE DAILY AS NEEDED FOR  SWELLING 06/21/16  Yes Midge Minium, MD  levofloxacin (LEVAQUIN) 250 MG tablet Take 250 mg by mouth daily. 01/09/17  Yes [provider]  ondansetron (ZOFRAN ODT) 4 MG disintegrating tablet 4mg  ODT q4 hours prn nausea/vomit 02/01/16  Yes Brunetta Jeans, PA-C  potassium chloride SA (K-DUR,KLOR-CON) 20 MEQ tablet Take 1 tablet (20 mEq total) by mouth daily. 02/26/16  Yes Strader, [place] M, PA-C  simvastatin (ZOCOR) 20 MG tablet TAKE ONE TABLET BY MOUTH ONCE DAILY AT 6PM 02/26/16  Yes Strader, [place] M, PA-C  triamcinolone ointment (KENALOG) 0.1 % Apply 1 application topically 2 (two) times daily. 07/08/16 07/08/17 Yes Midge Minium, MD  cyproheptadine (PERIACTIN) 2 MG/5ML syrup Take 10 mLs by mouth 3 (three) times daily. 01/08/17   [provider]  Guaifenesin-Codeine (GUAIFENESIN AC PO) Take 1 tablet by mouth daily.    [provider]     Allergies:     Allergies  Allergen Reactions  . Betadine [Povidone Iodine] Hives    Because of a history of documented adverse serious drug reaction;Medi Alert bracelet  is recommended  . Equalyte Hives    Medi Alert bracelet  is recommended if verified . Drug not found in reference texts ([facility] or [place] [facility])  . Neosporin [Neomycin-Polymyxin-Gramicidin] Hives    Medi Alert bracelet  is recommended  . Codeine Other (See Comments)    Intolerance      Physical Exam:   Vitals  Blood pressure (!) 86/66, pulse 67, temperature (!) 95.8 F (35.4 C), temperature source Rectal, resp. rate 18, SpO2 97 %.   1. General  lying in bed in NAD,    2. Normal affect and insight, Not Suicidal or Homicidal, Awake Alert, Oriented X 1 (person).  3. No F.N deficits, ALL C.Nerves Intact, Strength 5/5 all 4 extremities, Sensation intact all 4 extremities, Plantars down going.  4. Ears and Eyes appear Normal, Conjunctivae clear, PERRLA. Moist Oral Mucosa.  5. Supple Neck, No JVD, No cervical lymphadenopathy appriciated, No Carotid Bruits.  6.  Symmetrical Chest wall movement, Good air movement bilaterally, CTAB.  7. RRR, No Gallops, Rubs or Murmurs, No Parasternal Heave.  8. Positive Bowel Sounds, Abdomen Soft, No tenderness, No organomegaly appriciated,No rebound -guarding or rigidity.  9.  No Cyanosis,  Multiple bruises,  1large overback of rightshoulder, bilateral arms, left distal lower ext,  + skin tear over the left knee   10. Good muscle tone,  joints appear normal , no effusions, Normal ROM.  11. No Palpable Lymph Nodes in Neck or Axillae     Data Review:    CBC Recent Labs  Lab 01/10/17 1659 01/12/17 1013  WBC 11.4* 9.2  HGB 9.8* 9.7*  HCT 29.5* 29.1*  PLT 172 212  MCV 93.7 93.3  MCH 31.1 31.1  MCHC 33.2 33.3  RDW 16.9* 16.8*  LYMPHSABS 1.1 1.1  MONOABS 1.1* 0.8  EOSABS 0.1 0.1  BASOSABS 0.0 0.0   ------------------------------------------------------------------------------------------------------------------  Chemistries  Recent Labs  Lab 01/10/17 1659 01/12/17 1013  NA 141 138  K 3.1* 3.2*  CL 106 102  CO2 28 26  GLUCOSE 95 88  BUN 25* 29*  CREATININE 1.52* 1.47*  CALCIUM 7.9* 7.7*  AST 19 28  ALT 10* 10*  ALKPHOS 61 52  BILITOT 1.4* 1.2   ------------------------------------------------------------------------------------------------------------------ estimated creatinine clearance is 20.1 mL/min (A) (by C-G formula based on SCr of 1.47 mg/dL (H)). ------------------------------------------------------------------------------------------------------------------ No results for input(s): TSH, T4TOTAL, T3FREE, THYROIDAB in the last 72 hours.  Invalid input(s): FREET3  Coagulation profile Recent Labs  Lab 01/12/17 1013  INR 3.96   ------------------------------------------------------------------------------------------------------------------- No results for input(s): DDIMER in the last 72  hours. -------------------------------------------------------------------------------------------------------------------  Cardiac Enzymes No results for input(s): CKMB, TROPONINI, MYOGLOBIN in the last 168 hours.  Invalid input(s): CK ------------------------------------------------------------------------------------------------------------------    Component Value Date/Time   BNP 369.0 (H) 01/10/2017 1659     ---------------------------------------------------------------------------------------------------------------  Urinalysis    Component Value Date/Time   COLORURINE YELLOW 01/12/2017 1221   APPEARANCEUR HAZY (A) 01/12/2017 1221   LABSPEC 1.012 01/12/2017 1221   PHURINE 5.0 01/12/2017 1221   GLUCOSEU NEGATIVE 01/12/2017 1221   HGBUR NEGATIVE 01/12/2017 [place] 01/12/2017 1221   BILIRUBINUR negative 07/08/2016 1203   KETONESUR NEGATIVE 01/12/2017 1221   PROTEINUR NEGATIVE 01/12/2017 1221   UROBILINOGEN 0.2 07/08/2016 1203   UROBILINOGEN 1.0 05/17/2011 1959   NITRITE NEGATIVE 01/12/2017 1221   LEUKOCYTESUR MODERATE (A) 01/12/2017 1221    ----------------------------------------------------------------------------------------------------------------   Imaging Results:    Dg Chest 2 View  Result Date: 01/10/2017 CLINICAL DATA:  Weakness.  Cough and congestion. EXAM: CHEST  2 VIEW COMPARISON:  07/11/2016 FINDINGS: Mild cardiac enlargement. Aortic atherosclerosis noted. Moderate bilateral pleural effusions are identified left-greater-than-right. No airspace opacities or interstitial edema. The bones appear diffusely osteopenic. IMPRESSION: 1. Cardiac enlargement, aortic atherosclerosis, and bilateral pleural effusions compatible with CHF. Electronically Signed   By: Kerby Moors M.D.   On: 01/10/2017 18:26   Ct Head Wo Contrast  Result Date: 01/12/2017 CLINICAL DATA:  Widespread ecchymosis. EXAM: CT HEAD WITHOUT CONTRAST TECHNIQUE: Contiguous  axial images were obtained from the base of the skull through the vertex without intravenous contrast. COMPARISON:  07/11/2016 FINDINGS: Brain: No evidence of acute infarction, hemorrhage, hydrocephalus, extra-axial collection or mass lesion/mass effect. Stable old right MCA territory infarct, left parietal infarct, old bilateral basal ganglia lacunar infarcts and old left corona radiata lacunar infarct. Marked brain parenchymal volume loss and deep white matter microangiopathy. Stable right PE territory mass extending to the right cavernous sinus. Vascular: Advanced calcific atherosclerotic disease of the intracranial arteries. Skull:  Normal. Negative for fracture or focal lesion. Sinuses/Orbits: No acute finding. Other: None. IMPRESSION: No acute intracranial abnormality. Marked parenchyma atrophy and chronic microvascular disease. Stable sequela of bilateral cerebellar infarcts. Stable pituitary mass. Electronically Signed   By: Fidela Salisbury M.D.   On: 01/12/2017 13:31   Dg Chest Port 1 View  Result Date: 01/12/2017 CLINICAL DATA:  Weakness EXAM: PORTABLE CHEST 1 VIEW COMPARISON:  01/10/2017 FINDINGS: Heart remains enlarged. Low volumes. Diffuse interstitial prominence. Blunting of both costophrenic angles is stable. Bibasilar atelectasis. IMPRESSION: Stable interstitial prominence. Cardiomegaly. Stable small pleural effusions. Electronically Signed   By: Marybelle Killings M.D.   On: 01/12/2017 11:13      Assessment & Plan:    Principal Problem:   Hypotension Active Problems:   Hx of type 2 diabetes mellitus   Acute renal insufficiency   UTI (urinary tract infection)   Anemia   Hypokalemia   Severe protein-calorie malnutrition (HCC)    Hypotension ? Due to uti/ sepsis vs Aortic stenosis Tele Trop I q6h x3 Cardiac echo Cardiology consult for evaluation   UTI (prev grew out Klebsiella sensitive to rocephin) Awaiting urine culture tx with rocephin 1gm iv qday  Sepsis Blood culture  x2 Await urine culture rocephin iv  Hypokalemia Replete Check cmp in am  Renal insufficiency, mild ARF Hydrate with ns iv Check cmp in am  Severe protein calorie malnutrition prostat  CVA Coumadin pharmacy to dose  DVT Prophylaxis  coumadin  AM Labs Ordered, also please review Full Orders  Family Communication: Admission, patients condition and plan of care including tests being ordered have been discussed with the patient/ son  who indicate understanding and agree with the plan and Code Status.  I attempted to tell son about the severity of her condition and discuss her code status. He would like her FULL CODE  Code Status FULL CODE  Likely DC to  home  Condition GUARDED    Consults called: none  Admission status: inpatient   Time spent in minutes : 45 minutes, critical care   Jani Gravel M.D on 01/12/2017 at 2:51 PM  Between 7am to 7pm - Pager - [phone]  . After 7pm go to [email or web address] - password [facility]  [facility] - Office  [phone]

## 2017-01-12 NOTE — ED Notes (Signed)
Bed: PY09 Expected date:  Expected time:  Means of arrival:  Comments: EMS/aches

## 2017-01-12 NOTE — ED Notes (Signed)
Her IV has infiltrated and we have removed it. We have been unable to obtain 2nd lactic acid d/t difficult/impossible to stick, whereas multiple staff have tried. Pt. Remains in no distress, and in fact, has eaten a hamburger and drank some water without difficulty. I do notify Dr. Maudie Mercury of inability to obtain/maintain IV access.

## 2017-01-12 NOTE — Procedures (Signed)
Central Venous Catheter Insertion Procedure Note Christina Pittman [id number] Jun 04, 1919  Procedure: Insertion of Central Venous Catheter Indications: Drug and/or fluid administration  Procedure Details Consent: Risks of procedure as well as the alternatives and risks of each were explained to the (patient/caregiver).  Consent for procedure obtained. Time Out: Verified patient identification, verified procedure, site/side was marked, verified correct patient position, special equipment/implants available, medications/allergies/relevent history reviewed, required imaging and test results available.  Performed  Maximum sterile technique was used including standard sterile procedure. Skin prep: Chlorhexidine; local anesthetic administered A antimicrobial bonded/coated triple lumen catheter was placed in the right internal jugular vein using the Seldinger technique.  Evaluation Blood flow good Complications: No apparent complications Patient did tolerate procedure well. Chest X-ray ordered to verify placement.  CXR: normal.  Christina Pittman 01/12/2017, 10:03 PM

## 2017-01-12 NOTE — ED Notes (Signed)
No output noted in pure wick at this time

## 2017-01-12 NOTE — ED Notes (Signed)
I have informed Dr. Rogene Houston of lack of IV access; and that we are unable to establish a new IV. He states we will await u/a and head CT results to make additional decisions, including ?central line?Marland Kitchen

## 2017-01-12 NOTE — Progress Notes (Signed)
A consult was received from an ED physician for Vancomycin and Zosyn per pharmacy dosing.  The patient's profile has been reviewed for ht/wt/allergies/indication/available labs. A one time order has been placed for the above antibiotics.  Further antibiotics/pharmacy consults should be ordered by admitting physician if indicated.                       Thank you, Reuel Boom, PharmD, BCPS Pager: [phone] 01/12/2017, 9:54 AM

## 2017-01-13 ENCOUNTER — Inpatient Hospital Stay (HOSPITAL_COMMUNITY): Payer: Medicare HMO

## 2017-01-13 ENCOUNTER — Other Ambulatory Visit: Payer: Self-pay

## 2017-01-13 DIAGNOSIS — R9431 Abnormal electrocardiogram [ECG] [EKG]: Secondary | ICD-10-CM | POA: Diagnosis present

## 2017-01-13 DIAGNOSIS — I361 Nonrheumatic tricuspid (valve) insufficiency: Secondary | ICD-10-CM

## 2017-01-13 LAB — CBC
HCT: 27.5 % — ABNORMAL LOW (ref 36.0–46.0)
Hemoglobin: 8.9 g/dL — ABNORMAL LOW (ref 12.0–15.0)
MCH: 30.5 pg (ref 26.0–34.0)
MCHC: 32.4 g/dL (ref 30.0–36.0)
MCV: 94.2 fL (ref 78.0–100.0)
Platelets: 153 K/uL (ref 150–400)
RBC: 2.92 MIL/uL — ABNORMAL LOW (ref 3.87–5.11)
RDW: 16.8 % — ABNORMAL HIGH (ref 11.5–15.5)
WBC: 6.5 K/uL (ref 4.0–10.5)

## 2017-01-13 LAB — TROPONIN I
TROPONIN I: 0.13 ng/mL — AB (ref <0.03)
TROPONIN I: 0.12 ng/mL — AB (ref <0.03)
Troponin I: 0.12 ng/mL (ref <0.03)

## 2017-01-13 LAB — ECHOCARDIOGRAM COMPLETE
Height: 62 in
WEIGHTICAEL: 2010.5952 [oz_av]

## 2017-01-13 LAB — URINE CULTURE: Culture: 70000 — AB

## 2017-01-13 LAB — COMPREHENSIVE METABOLIC PANEL WITH GFR
ALT: 11 U/L — ABNORMAL LOW (ref 14–54)
AST: 24 U/L (ref 15–41)
Albumin: 2.4 g/dL — ABNORMAL LOW (ref 3.5–5.0)
Alkaline Phosphatase: 45 U/L (ref 38–126)
Anion gap: 5 (ref 5–15)
BUN: 30 mg/dL — ABNORMAL HIGH (ref 6–20)
CO2: 27 mmol/L (ref 22–32)
Calcium: 7.7 mg/dL — ABNORMAL LOW (ref 8.9–10.3)
Chloride: 107 mmol/L (ref 101–111)
Creatinine, Ser: 1.51 mg/dL — ABNORMAL HIGH (ref 0.44–1.00)
GFR calc Af Amer: 33 mL/min — ABNORMAL LOW
GFR calc non Af Amer: 28 mL/min — ABNORMAL LOW
Glucose, Bld: 83 mg/dL (ref 65–99)
Potassium: 2.9 mmol/L — ABNORMAL LOW (ref 3.5–5.1)
Sodium: 139 mmol/L (ref 135–145)
Total Bilirubin: 1.6 mg/dL — ABNORMAL HIGH (ref 0.3–1.2)
Total Protein: 5.1 g/dL — ABNORMAL LOW (ref 6.5–8.1)

## 2017-01-13 LAB — MRSA PCR SCREENING: MRSA by PCR: NEGATIVE

## 2017-01-13 LAB — MAGNESIUM: Magnesium: 1.9 mg/dL (ref 1.7–2.4)

## 2017-01-13 MED ORDER — SODIUM CHLORIDE 0.9 % IV SOLN
1.5000 g | Freq: Two times a day (BID) | INTRAVENOUS | Status: DC
  Administered 2017-01-14 (×2): 1.5 g via INTRAVENOUS
  Filled 2017-01-13 (×3): qty 1.5

## 2017-01-13 MED ORDER — FLUCONAZOLE 100MG IVPB
100.0000 mg | INTRAVENOUS | Status: DC
  Administered 2017-01-13 – 2017-01-15 (×2): 100 mg via INTRAVENOUS
  Filled 2017-01-13 (×3): qty 50

## 2017-01-13 MED ORDER — POTASSIUM CHLORIDE IN NACL 20-0.9 MEQ/L-% IV SOLN
INTRAVENOUS | Status: DC
Start: 2017-01-13 — End: 2017-01-16
  Administered 2017-01-13 – 2017-01-16 (×4): via INTRAVENOUS
  Filled 2017-01-13 (×6): qty 1000

## 2017-01-13 MED ORDER — HYDROMORPHONE HCL 1 MG/ML IJ SOLN
0.5000 mg | Freq: Once | INTRAMUSCULAR | Status: AC
  Administered 2017-01-13: 0.5 mg via INTRAVENOUS
  Filled 2017-01-13: qty 1

## 2017-01-13 MED ORDER — SODIUM CHLORIDE 0.9 % IV BOLUS (SEPSIS)
500.0000 mL | Freq: Once | INTRAVENOUS | Status: AC
  Administered 2017-01-13: 500 mL via INTRAVENOUS

## 2017-01-13 MED ORDER — SODIUM CHLORIDE 0.9 % IV SOLN
3.0000 g | INTRAVENOUS | Status: DC
  Filled 2017-01-13: qty 3

## 2017-01-13 MED ORDER — POTASSIUM CHLORIDE CRYS ER 20 MEQ PO TBCR
40.0000 meq | EXTENDED_RELEASE_TABLET | Freq: Once | ORAL | Status: AC
  Administered 2017-01-13: 40 meq via ORAL
  Filled 2017-01-13: qty 2

## 2017-01-13 MED ORDER — LORAZEPAM 2 MG/ML IJ SOLN: 0.5000 mg | Freq: Four times a day (QID) | INTRAMUSCULAR | Status: DC | PRN

## 2017-01-13 MED ORDER — SODIUM CHLORIDE 0.9 % IV SOLN
1.5000 g | INTRAVENOUS | Status: AC
  Administered 2017-01-13: 1.5 g via INTRAVENOUS
  Filled 2017-01-13: qty 1.5

## 2017-01-13 MED ORDER — FENTANYL CITRATE (PF) 100 MCG/2ML IJ SOLN
12.5000 ug | INTRAMUSCULAR | Status: DC | PRN
  Administered 2017-01-13 – 2017-01-16 (×3): 12.5 ug via INTRAVENOUS
  Filled 2017-01-13 (×3): qty 2

## 2017-01-13 NOTE — Care Management Note (Signed)
Case Management Note  Patient Details  Name: Christina Pittman MRN: [id number] Date of Birth: Apr 09, 1919  Subjective/Objective:                  Weakness and sepsis with elevated troponin and lactic acid levels  Action/Plan:   Expected Discharge Date:                  Expected Discharge Plan:  Home/Self Care  In-House Referral:     Discharge planning Services  CM Consult  Post Acute Care Choice:    Choice offered to:     DME Arranged:    DME Agency:     HH Arranged:    HH Agency:     Status of Service:  In process, will continue to follow  If discussed at Long Length of Stay Meetings, dates discussed:    Additional Comments:  Leeroy Cha, RN 01/13/2017, 7:55 AM

## 2017-01-13 NOTE — Progress Notes (Signed)
TRH made aware of critical troponin 0.12

## 2017-01-13 NOTE — Progress Notes (Signed)
Pharmacy Antibiotic Note  Christina Pittman is a 83 y.o. female admitted on 01/12/2017 with sepsis likely due to UTI and CAP.  Patient initially placed on Ceftriaxone, now transitioned to Fluconazole for UTI and Unasyn for CAP. Pharmacy has been consulted to assist with dosing.   Plan: Fluconazole 100mg  IV q24h (MD aware of risk of QT prolongation-will need to monitor closely). Unasyn 1.5g IV q12h, Monitor renal function, cultures, clinical course.   Height: 5\' 2"  (157.5 cm) Weight: 125 lb 10.6 oz (57 kg) IBW/kg (Calculated) : 50.1  Temp (24hrs), Avg:97.2 F (36.2 C), Min:97.1 F (36.2 C), Max:97.4 F (36.3 C)  Recent Labs  Lab 01/10/17 1659 01/10/17 1711 01/12/17 1013 01/12/17 1026 01/13/17 1131  WBC 11.4*  --  9.2  --  6.5  CREATININE 1.52*  --  1.47*  --  1.51*  LATICACIDVEN  --  1.54  --  2.19*  --     Estimated Creatinine Clearance: 17.6 mL/min (A) (by C-G formula based on SCr of 1.51 mg/dL (H)).    Allergies  Allergen Reactions  . Betadine [Povidone Iodine] Hives    Because of a history of documented adverse serious drug reaction;Medi Alert bracelet  is recommended  . Equalyte Hives    Medi Alert bracelet  is recommended if verified . Drug not found in reference texts ([facility] or [place] [facility])  . Neosporin [Neomycin-Polymyxin-Gramicidin] Hives    Medi Alert bracelet  is recommended  . Codeine Other (See Comments)    Intolerance     Antimicrobials this admission: 12/16 >> Ceftriaxone >> 12/17 12/17 >> Fluconazole >> 12/17 >> Unasyn >>  Dose adjustments this admission: --  Microbiology results: 12/16 BCx: IP 12/16 UCx: 70K yeast  12/17 MRSA PCR: negative  Thank you for allowing pharmacy to be a part of this patient's care.   Lindell Spar, PharmD, BCPS Pager: [phone] 01/13/2017 8:52 PM

## 2017-01-13 NOTE — Progress Notes (Addendum)
[facility] Progress Note  Patient: Christina Pittman WUJ:[id number]   PCP: Patient, No Pcp Per DOB: August 08, 1919   DOA: 01/12/2017   DOS: 01/13/2017   Date of Service: the patient was seen and examined on 01/13/2017  Subjective: No acute events overnight, occasionally gets combative and agitated.  At the time of my evaluation was following commands, cooperative and answering questions appropriately.  Brief hospital course: Pt. with PMH of HTN, HLD, type II DM, PVD S/P right BKA, CVA on Eliquis, pituitary macroadenoma, severe aortic stenosis; admitted on 01/12/2017, presented with complaint of confusion, was found to have acute encephalopathy, community-acquired pneumonia. Currently further plan is continue IV antibiotics.  Assessment and Plan: 1.  Acute encephalopathy. Likely toxic metabolic due to UTI and pneumonia. Also progression of the dementia. CT head unremarkable for any acute abnormality, no focal deficit on my evaluation as well. Getting better per family who is at bedside but not at baseline. Continue close monitoring and continue IV hydration for now. Patient occasionally noncooperative with medical care per nursing, minimize intervention. Avoid Haldol in light of QT prolongation  2.  Sepsis likely due to UTI and community-acquired pneumonia. Hypothermic and hypotensive on admission Chest x-ray shows left basal pneumonia UTI shows pyuria. Blood cultures so far negative. Continue IV ceftriaxone and azithromycin.  3.  Aortic stenosis. History of hypertension. Hypotension. Currently holding antihypertensive medications.  Patient is also taking Lasix at home which is on hold right now.  4.  History of CVA. History of PVD. Only takes Eliquis, does not take aspirin. Discontinue aspirin. Continue Eliquis for now.  5.  Goals of care discussion. Patient's son was at bedside, discussed at length regarding patient's prognosis. Informed that with her severe aortic stenosis and  an event of cardiac arrest recovery is highly unlikely and if choosing DNR/DNI would not limit patient getting any treatment for pneumonia until UTIs. Son currently wants to continue full code.  Addendum: Had extensive discussion again with pt's family. Two son and daughter, explained pt's current condition with AKI, Pneumonia, UTI and encephalopathy. Along with that her advanced age and her severe AS. Explained that she has guarded prognosis right now and in event of significant imporevment she still has poor prognosis due to the fact of AS. They agreed to the recommendation of considering DNR DNI for pt. If the pt does not improve in next 24-48 hours may need to have another meeting to consider goals of care.   Berle Mull 10:47 PM 01/13/2017    6.  Multiple bruises. Left leg blister. Left knee abrasion. Patient is on Eliquis, likely the cause of patient's multiple bruises with her agitation. Continue to monitor for now.  7.  Right upper extremity edema. Per family patient's IV infiltrated yesterday and then started having swelling of her right arm. Upper extremity elevation recommended.  Diet: Aspiration precaution, cardiac diet DVT Prophylaxis: on therapeutic anticoagulation.  Advance goals of care discussion: see above  Family Communication: family was present at bedside, at the time of interview. The pt provided permission to discuss medical plan with the family. Opportunity was given to ask question and all questions were answered satisfactorily.   Disposition:  Discharge to home.  Consultants: none Procedures: none  Antibiotics: Anti-infectives (From admission, onward)   Start     Dose/Rate Route Frequency Ordered Stop   01/12/17 1830  cefTRIAXone (ROCEPHIN) 1 g in dextrose 5 % 50 mL IVPB     1 g 100 mL/hr over 30 Minutes Intravenous Every 24 hours  01/12/17 1819     01/12/17 1000  piperacillin-tazobactam (ZOSYN) IVPB 3.375 g     3.375 g 100 mL/hr over 30 Minutes  Intravenous  Once 01/12/17 0947 01/12/17 1123   01/12/17 1000  vancomycin (VANCOCIN) IVPB 1000 mg/200 mL premix     1,000 mg 200 mL/hr over 60 Minutes Intravenous  Once 01/12/17 0947 01/12/17 1153       Objective: Physical Exam: Vitals:   01/13/17 0700 01/13/17 0755 01/13/17 1000 01/13/17 1150  BP: (!) 92/39  (!) 161/116 (!) 151/101  Pulse: (!) 57  67 61  Resp:   12 (!) 23  Temp:      TempSrc:      SpO2: 95%  99% 98%  Weight:  57 kg (125 lb 10.6 oz)    Height:  5\' 2"  (1.575 m)      Intake/Output Summary (Last 24 hours) at 01/13/2017 1229 Last data filed at 01/13/2017 0900 Gross per 24 hour  Intake -  Output 0 ml  Net 0 ml   Filed Weights   01/12/17 2052 01/13/17 0755  Weight: 57 kg (125 lb 10.6 oz) 57 kg (125 lb 10.6 oz)   General: Alert, Awake and Oriented to Time, Place and Person. Appear in mild distress, affect appropriate Eyes: PERRL, Conjunctiva normal ENT: Oral Mucosa clear moist. Neck: difficult to assess JVD, no Abnormal Mass Or lumps Cardiovascular: S1 and S2 Present, aortic systolic Murmur, Peripheral Pulses Present Respiratory: normal respiratory effort, Bilateral Air entry equal and Decreased, no use of accessory muscle, Clear to Auscultation, no Crackles, no wheezes Abdomen: Bowel Sound present, Soft and no tenderness, no hernia Skin: multiple bruises no Rash, no induration Extremities: no Pedal edema, no calf tenderness Neurologic: Grossly no focal neuro deficit. Bilaterally Equal motor strength  Data Reviewed: CBC: Recent Labs  Lab 01/10/17 1659 01/12/17 1013 01/13/17 1131  WBC 11.4* 9.2 6.5  NEUTROABS 9.1* 7.3  --   HGB 9.8* 9.7* 8.9*  HCT 29.5* 29.1* 27.5*  MCV 93.7 93.3 94.2  PLT 172 212 188   Basic Metabolic Panel: Recent Labs  Lab 01/10/17 1659 01/12/17 1013 01/13/17 1131  NA 141 138 139  K 3.1* 3.2* 2.9*  CL 106 102 107  CO2 28 26 27   GLUCOSE 95 88 83  BUN 25* 29* 30*  CREATININE 1.52* 1.47* 1.51*  CALCIUM 7.9* 7.7* 7.7*  MG   --   --  1.9    Liver Function Tests: Recent Labs  Lab 01/10/17 1659 01/12/17 1013 01/13/17 1131  AST 19 28 24   ALT 10* 10* 11*  ALKPHOS 61 52 45  BILITOT 1.4* 1.2 1.6*  PROT 5.4* 5.2* 5.1*  ALBUMIN 2.2* 2.2* 2.4*   No results for input(s): LIPASE, AMYLASE in the last 168 hours. No results for input(s): AMMONIA in the last 168 hours. Coagulation Profile: Recent Labs  Lab 01/12/17 1013  INR 3.96   Cardiac Enzymes: Recent Labs  Lab 01/12/17 2255 01/13/17 0137  TROPONINI 0.12* 0.13*   BNP (last 3 results) No results for input(s): PROBNP in the last 8760 hours. CBG: No results for input(s): GLUCAP in the last 168 hours. Studies: Ct Head Wo Contrast  Result Date: 01/12/2017 CLINICAL DATA:  Widespread ecchymosis. EXAM: CT HEAD WITHOUT CONTRAST TECHNIQUE: Contiguous axial images were obtained from the base of the skull through the vertex without intravenous contrast. COMPARISON:  07/11/2016 FINDINGS: Brain: No evidence of acute infarction, hemorrhage, hydrocephalus, extra-axial collection or mass lesion/mass effect. Stable old right MCA territory infarct, left  parietal infarct, old bilateral basal ganglia lacunar infarcts and old left corona radiata lacunar infarct. Marked brain parenchymal volume loss and deep white matter microangiopathy. Stable right PE territory mass extending to the right cavernous sinus. Vascular: Advanced calcific atherosclerotic disease of the intracranial arteries. Skull: Normal. Negative for fracture or focal lesion. Sinuses/Orbits: No acute finding. Other: None. IMPRESSION: No acute intracranial abnormality. Marked parenchyma atrophy and chronic microvascular disease. Stable sequela of bilateral cerebellar infarcts. Stable pituitary mass. Electronically Signed   By: Fidela Salisbury M.D.   On: 01/12/2017 13:31   Dg Chest Port 1 View  Result Date: 01/12/2017 CLINICAL DATA:  Central catheter placement EXAM: PORTABLE CHEST 1 VIEW COMPARISON:  January 12, 2017 study obtained earlier in the day FINDINGS: Central catheter tip is in the superior vena cava. No pneumothorax. There is consolidation in the left base with small left pleural effusion. Lungs elsewhere clear. There is stable cardiomegaly with pulmonary vascularity within normal limits. There is aortic atherosclerosis. Bones are osteoporotic. IMPRESSION: Central catheter tip in superior vena cava. No pneumothorax. Persistent consolidation left base with small left pleural effusion. No new opacity evident. There is aortic atherosclerosis. Electronically Signed   By: Lowella Grip III M.D.   On: 01/12/2017 21:54    Scheduled Meds: . apixaban  5 mg Oral BID  . aspirin EC  81 mg Oral Daily  . feeding supplement (PRO-STAT SUGAR FREE 64)  30 mL Oral BID   Continuous Infusions: . cefTRIAXone (ROCEPHIN)  IV     PRN Meds: acetaminophen **OR** acetaminophen  Time spent: 35 minutes  Author: Berle Mull, MD [facility] Pager: [phone] 01/13/2017 12:29 PM  If 7PM-7AM, please contact night-coverage at [email or web address], password [email or web address]

## 2017-01-13 NOTE — Progress Notes (Signed)
Pt fights and refuses to let tech or this RN get oral or axillary temp.  Informed Dr. Posey Pronto.  Irven Baltimore, RN

## 2017-01-13 NOTE — Progress Notes (Signed)
1233 has had 0 urine all day.  Bladder scan resulted 19mL.  Informed Dr. Posey Pronto.  Irven Baltimore, RN

## 2017-01-13 NOTE — Progress Notes (Signed)
During placement of central line @ 2100, Dr. Marcello Moores advised to restrain patients wrists bilat with soft restraints, which was done.  After central line placement, Dr. Marcello Moores advised to keep restraints on to avoid patient reaching up to neck and pulling central line out.  Restraints kept on.  Later that night when reviewing chart @ 0100, noticed order for restraints had not been placed.  Restraint order immediately entered into medical record.   [place] [facility] Care Coordinator / Rapid Response Nurse

## 2017-01-13 NOTE — Progress Notes (Signed)
  Echocardiogram 2D Echocardiogram has been performed.  Darlina Sicilian M 01/13/2017, 1:50 PM

## 2017-01-14 ENCOUNTER — Inpatient Hospital Stay (HOSPITAL_COMMUNITY): Payer: Medicare HMO

## 2017-01-14 DIAGNOSIS — N179 Acute kidney failure, unspecified: Secondary | ICD-10-CM

## 2017-01-14 DIAGNOSIS — E860 Dehydration: Secondary | ICD-10-CM

## 2017-01-14 DIAGNOSIS — E876 Hypokalemia: Secondary | ICD-10-CM

## 2017-01-14 LAB — COMPREHENSIVE METABOLIC PANEL
ALBUMIN: 2.1 g/dL — AB (ref 3.5–5.0)
ALK PHOS: 43 U/L (ref 38–126)
ALT: 11 U/L — ABNORMAL LOW (ref 14–54)
AST: 20 U/L (ref 15–41)
Anion gap: 7 (ref 5–15)
BILIRUBIN TOTAL: 1.6 mg/dL — AB (ref 0.3–1.2)
BUN: 31 mg/dL — AB (ref 6–20)
CALCIUM: 7.5 mg/dL — AB (ref 8.9–10.3)
CO2: 22 mmol/L (ref 22–32)
Chloride: 113 mmol/L — ABNORMAL HIGH (ref 101–111)
Creatinine, Ser: 1.58 mg/dL — ABNORMAL HIGH (ref 0.44–1.00)
GFR calc Af Amer: 31 mL/min — ABNORMAL LOW (ref >60)
GFR calc non Af Amer: 27 mL/min — ABNORMAL LOW (ref >60)
GLUCOSE: 65 mg/dL (ref 65–99)
Potassium: 3.8 mmol/L (ref 3.5–5.1)
SODIUM: 142 mmol/L (ref 135–145)
TOTAL PROTEIN: 4.6 g/dL — AB (ref 6.5–8.1)

## 2017-01-14 LAB — CBC WITH DIFFERENTIAL/PLATELET
Basophils Absolute: 0 10*3/uL (ref 0.0–0.1)
Basophils Relative: 0 %
Eosinophils Absolute: 0 10*3/uL (ref 0.0–0.7)
Eosinophils Relative: 1 %
HEMATOCRIT: 25.9 % — AB (ref 36.0–46.0)
Hemoglobin: 8.5 g/dL — ABNORMAL LOW (ref 12.0–15.0)
LYMPHS PCT: 11 %
Lymphs Abs: 0.9 10*3/uL (ref 0.7–4.0)
MCH: 31.1 pg (ref 26.0–34.0)
MCHC: 32.8 g/dL (ref 30.0–36.0)
MCV: 94.9 fL (ref 78.0–100.0)
MONO ABS: 0.9 10*3/uL (ref 0.1–1.0)
MONOS PCT: 11 %
NEUTROS ABS: 6.3 10*3/uL (ref 1.7–7.7)
Neutrophils Relative %: 77 %
Platelets: 143 10*3/uL — ABNORMAL LOW (ref 150–400)
RBC: 2.73 MIL/uL — ABNORMAL LOW (ref 3.87–5.11)
RDW: 17.6 % — AB (ref 11.5–15.5)
WBC: 8.2 10*3/uL (ref 4.0–10.5)

## 2017-01-14 LAB — LACTIC ACID, PLASMA: Lactic Acid, Venous: 1.3 mmol/L (ref 0.5–1.9)

## 2017-01-14 LAB — PROTIME-INR
INR: 3.07
Prothrombin Time: 31.5 seconds — ABNORMAL HIGH (ref 11.4–15.2)

## 2017-01-14 LAB — MAGNESIUM: Magnesium: 1.9 mg/dL (ref 1.7–2.4)

## 2017-01-14 MED ORDER — HYDROMORPHONE HCL 1 MG/ML IJ SOLN
0.5000 mg | Freq: Once | INTRAMUSCULAR | Status: AC
  Administered 2017-01-14: 0.5 mg via INTRAVENOUS
  Filled 2017-01-14: qty 1

## 2017-01-14 MED ORDER — SODIUM CHLORIDE 0.9 % IV BOLUS (SEPSIS)
250.0000 mL | Freq: Once | INTRAVENOUS | Status: AC
  Administered 2017-01-14: 250 mL via INTRAVENOUS

## 2017-01-14 MED ORDER — SODIUM CHLORIDE 0.9 % IV BOLUS (SEPSIS): 1000.0000 mL | Freq: Once | INTRAVENOUS | Status: DC

## 2017-01-14 MED ORDER — SODIUM CHLORIDE 0.9% FLUSH
10.0000 mL | Freq: Two times a day (BID) | INTRAVENOUS | Status: DC
  Administered 2017-01-14 – 2017-01-17 (×6): 10 mL

## 2017-01-14 MED ORDER — SODIUM CHLORIDE 0.9% FLUSH: 10.0000 mL | INTRAVENOUS | Status: DC | PRN

## 2017-01-14 MED ORDER — CHLORHEXIDINE GLUCONATE CLOTH 2 % EX PADS: 6.0000 | MEDICATED_PAD | Freq: Every day | CUTANEOUS | Status: DC

## 2017-01-14 NOTE — Progress Notes (Signed)
Initial Nutrition Assessment  DOCUMENTATION CODES:   (Will assess for malnutrition at follow-up)  INTERVENTION:  - Will d/c Prostat. - Will order Magic Cup BID in [employer] for BID, each supplement provides 290 kcal and 9 grams of protein. - Will order Hormel Shake in [employer] for once/day, this supplement provides 500 kcal and 23 grams of protein.  - RD will continue to monitor for additional nutrition-related needs.   NUTRITION DIAGNOSIS:   Inadequate oral intake related to acute illness, poor appetite as evidenced by per patient/family report.  GOAL:   Patient will meet greater than or equal to 90% of their needs  MONITOR:   PO intake, Supplement acceptance, Weight trends, Labs, Skin  REASON FOR ASSESSMENT:   Low [place], Consult Assessment of nutrition requirement/status  ASSESSMENT:   83 year-old with PMH of HTN, HLD, type II DM, PVD S/P right BKA, CVA on Eliquis, pituitary macroadenoma, severe aortic stenosis; admitted on 01/12/2017, presented with complaint of confusion, was found to have acute encephalopathy, community-acquired pneumonia.   BMI indicates normal weight. No intakes documented since admission. Pt unable to provide much information at this time and was groaning throughout most of RD visit and requesting family members, who are at bedside, to take her home. Family member reports that pt got "strangled" when she drank water earlier today and that this is new for her (will alert MD to this). Family member states that a different family member was present earlier and informed her that pt had consumed a piece of a cracker but had not had anything else to eat or drink. Family members that are currently present are unable to give PTA information.   Unable to perform NFPE at this time as pt states being in pain; will attempt at follow-up. Per chart review, pt weighed 165 lbs on 12/12 at [facility]. Unsure of 40 lbs (24% body weight) being lost in 5 days. Will monitor  weight trends closely during hospitalization. Palliative Care has been consulted.   Medications reviewed; 40 mEq oral KCl x1 dose yesterday.  Labs reviewed; Cl: 113 mmol/L, BUN: 31 mg/dL, creatinine: 1.58 mg/dL, Ca: 7.5 mg/dL, GFR: 27 mL/min.  IVF: NS-20 mEq KCl @ 100 mL/hr.     NUTRITION - FOCUSED PHYSICAL EXAM:  Unable to perform/complete at this time with respect to pt's comfort  Diet Order:  Diet Heart Room service appropriate? Yes; Fluid consistency: Thin  EDUCATION NEEDS:   No education needs have been identified at this time  Skin:  Skin Assessment: Skin Integrity Issues: Skin Integrity Issues:: Incisions, Other (Comment) Incisions: chest (12/16) Other: wounds to buttocks and R wrist  Last BM:  PTA/unknown  Height:   Ht Readings from Last 1 Encounters:  01/13/17 5\' 2"  (1.575 m)    Weight:   Wt Readings from Last 1 Encounters:  01/13/17 125 lb 10.6 oz (57 kg)    Ideal Body Weight:  50 kg  BMI:  Body mass index is 22.98 kg/m.  Estimated Nutritional Needs:   Kcal:  1255-1425 (22-25 kcal/kg)  Protein:  55-65 grams  Fluid:  >/= 1.5 L/day     Jarome Matin, MS, RD, LDN, [facility] Inpatient Clinical Dietitian Pager # [phone] After hours/weekend pager # [phone]

## 2017-01-14 NOTE — Progress Notes (Signed)
Palliative Medicine consult noted. Due to high referral volume, there may be a delay seeing this patient. Please call the Palliative Medicine Team office at [phone] if recommendations are needed in the interim.  Thank you for inviting [place] to see this patient.  Marjie Skiff Miyuki Rzasa, RN, BSN, [facility] 01/14/2017 9:12 AM Cell [phone] 8:00-4:00 Monday-Friday Office [phone]

## 2017-01-14 NOTE — Progress Notes (Addendum)
PROGRESS NOTE    Christina Pittman  UMP:[id number] DOB: 1919/06/07 DOA: 01/12/2017 PCP: Patient, No Pcp Per    Brief Narrative: Pt. with PMH of HTN, HLD, type II DM, PVD S/P right BKA, CVA on Eliquis, pituitary macroadenoma, severe aortic stenosis; admitted on 01/12/2017, presented with complaint of confusion, was found to have acute encephalopathy, community-acquired pneumonia. Currently further plan is continue IV antibiotics.    Assessment & Plan:   Principal Problem:   Hypotension Active Problems:   Hx of type 2 diabetes mellitus   Acute renal insufficiency   UTI (urinary tract infection)   Anemia   Hypokalemia   Severe protein-calorie malnutrition (HCC)   Prolonged QT interval   Acute encephalopathy secondary to toxics, metabolic from aspiration pneumonia and possible urinary tract infection versus progression of her dementia. CT head without contrast unremarkable.  The patient's son at bedside reports she is not at baseline at this time. Patient is lethargic but is able to answer simple questions.   Sepsis secondary to community-acquired pneumonia and urinary tract infection. Resume Unasyn to complete the course.  History of moderate aortic stenosis  History of CVA Takes Eliquis which has been held for diffuse ecchymosis on her arms and oozing of blood from her skin.  In view of her multiple medical problems, acute encephalopathy pneumonia, urinary tract infection, acute kidney injury, clinical deterioration despite treatment with IV antibiotics recommend palliative care consult for goals of care discussion.  Hypokalemia Replaced   Acute kidney injury Baseline creatinine appears to be less than 1 patient was admitted with a creatinine of 1.52 and her creatinine has plateaued at 1.5.  Her urine output has been minimal.  We will get ultrasound of kidneys to evaluate for obstructive causes.   Anemia of chronic disease Baseline hemoglobin between 11-12 her hemoglobin on  admission was 9.8 probably hemoconcentrated sample from dehydration.  Hemoglobin has dropped to 8.5 today.  Get anemia panel and monitor hemoglobin daily.    Severe protein energy malnutrition Dietary consult.   DVT prophylaxis: Eliquis which has been held for diffuse ecchymosis Code Status: DNR Family Communication: Patient's son at bedside and discussed the plan with the patient's son Disposition Plan: Pending further evaluation, PT evaluation   Consultants:   Palliative care consult for goals of care   Procedures: None   Antimicrobials: Unasyn since yesterday for aspiration pneumonia   Subjective: Reports pain in the arms where the bp cuff is .  No other complaints.   Objective: Vitals:   01/14/17 0900 01/14/17 1000 01/14/17 1100 01/14/17 1216  BP:  104/78    Pulse: 94 (!) 53 64   Resp: (!) 23 18 12    Temp:    98 F (36.7 C)  TempSrc:    Axillary  SpO2: 98% 99% 98%   Weight:      Height:        Intake/Output Summary (Last 24 hours) at 01/14/2017 1340 Last data filed at 01/14/2017 0400 Gross per 24 hour  Intake 1420 ml  Output 0 ml  Net 1420 ml   Filed Weights   01/12/17 2052 01/13/17 0755  Weight: 57 kg (125 lb 10.6 oz) 57 kg (125 lb 10.6 oz)    Examination:  General exam: Cachectic looking lady appears comfortable Respiratory system: Diminished air entry at bases, no wheezing or rhonchi. Cardiovascular system: S1 & S2 heard, is regular, systolic murmur . No pedal edema. Gastrointestinal system: Abdomen is nondistended, soft and non tender Central nervous system: Confused, but able to  answer simple questions, nonfocal Extremities: Bilateral upper extremities have ecchymosis. Right lower extremity bandaged.  Skin: pt 's skin is fragile, ecchymotic, starting at the elbows, with oozing blood. Psychiatry:  Mood & affect appropriate.     Data Reviewed: I have personally reviewed following labs and imaging studies  CBC: Recent Labs  Lab  01/10/17 1659 01/12/17 1013 01/13/17 1131 01/14/17 0640  WBC 11.4* 9.2 6.5 8.2  NEUTROABS 9.1* 7.3  --  6.3  HGB 9.8* 9.7* 8.9* 8.5*  HCT 29.5* 29.1* 27.5* 25.9*  MCV 93.7 93.3 94.2 94.9  PLT 172 212 153 202*   Basic Metabolic Panel: Recent Labs  Lab 01/10/17 1659 01/12/17 1013 01/13/17 1131 01/14/17 0640  NA 141 138 139 142  K 3.1* 3.2* 2.9* 3.8  CL 106 102 107 113*  CO2 28 26 27 22   GLUCOSE 95 88 83 65  BUN 25* 29* 30* 31*  CREATININE 1.52* 1.47* 1.51* 1.58*  CALCIUM 7.9* 7.7* 7.7* 7.5*  MG  --   --  1.9 1.9   GFR: Estimated Creatinine Clearance: 16.8 mL/min (A) (by C-G formula based on SCr of 1.58 mg/dL (H)). Liver Function Tests: Recent Labs  Lab 01/10/17 1659 01/12/17 1013 01/13/17 1131 01/14/17 0640  AST 19 28 24 20   ALT 10* 10* 11* 11*  ALKPHOS 61 52 45 43  BILITOT 1.4* 1.2 1.6* 1.6*  PROT 5.4* 5.2* 5.1* 4.6*  ALBUMIN 2.2* 2.2* 2.4* 2.1*   No results for input(s): LIPASE, AMYLASE in the last 168 hours. No results for input(s): AMMONIA in the last 168 hours. Coagulation Profile: Recent Labs  Lab 01/12/17 1013 01/14/17 0640  INR 3.96 3.07   Cardiac Enzymes: Recent Labs  Lab 01/12/17 2255 01/13/17 0137 01/13/17 1131  TROPONINI 0.12* 0.13* 0.12*   BNP (last 3 results) No results for input(s): PROBNP in the last 8760 hours. HbA1C: No results for input(s): HGBA1C in the last 72 hours. CBG: No results for input(s): GLUCAP in the last 168 hours. Lipid Profile: No results for input(s): CHOL, HDL, LDLCALC, TRIG, CHOLHDL, LDLDIRECT in the last 72 hours. Thyroid Function Tests: No results for input(s): TSH, T4TOTAL, FREET4, T3FREE, THYROIDAB in the last 72 hours. Anemia Panel: No results for input(s): VITAMINB12, FOLATE, FERRITIN, TIBC, IRON, RETICCTPCT in the last 72 hours. Sepsis Labs: Recent Labs  Lab 01/10/17 1711 01/12/17 1026 01/14/17 0500  LATICACIDVEN 1.54 2.19* 1.3    Recent Results (from the past 240 hour(s))  Blood Culture  (routine x 2)     Status: None (Preliminary result)   Collection Time: 01/12/17 10:13 AM  Result Value Ref Range Status   Specimen Description BLOOD BLOOD RIGHT ARM  Final   Special Requests   Final    BOTTLES DRAWN AEROBIC AND ANAEROBIC Blood Culture adequate volume   Culture   Final    NO GROWTH < 24 HOURS Performed at [facility], [place] [address], [place], [place] [address]    Report Status PENDING  Incomplete  Urine culture     Status: Abnormal   Collection Time: 01/12/17 12:21 PM  Result Value Ref Range Status   Specimen Description URINE, CATHETERIZED  Final   Special Requests NONE  Final   Culture 70,000 COLONIES/mL YEAST (A)  Final   Report Status 01/13/2017 FINAL  Final  MRSA PCR Screening     Status: None   Collection Time: 01/13/17 12:02 AM  Result Value Ref Range Status   MRSA by PCR NEGATIVE NEGATIVE Final  Comment:        The GeneXpert MRSA Assay (FDA approved for NASAL specimens only), is one component of a comprehensive MRSA colonization surveillance program. It is not intended to diagnose MRSA infection nor to guide or monitor treatment for MRSA infections.          Radiology Studies: Dg Chest Port 1 View  Result Date: 01/12/2017 CLINICAL DATA:  Central catheter placement EXAM: PORTABLE CHEST 1 VIEW COMPARISON:  January 12, 2017 study obtained earlier in the day FINDINGS: Central catheter tip is in the superior vena cava. No pneumothorax. There is consolidation in the left base with small left pleural effusion. Lungs elsewhere clear. There is stable cardiomegaly with pulmonary vascularity within normal limits. There is aortic atherosclerosis. Bones are osteoporotic. IMPRESSION: Central catheter tip in superior vena cava. No pneumothorax. Persistent consolidation left base with small left pleural effusion. No new opacity evident. There is aortic atherosclerosis. Electronically Signed   By: Lowella Grip III M.D.   On: 01/12/2017 21:54         Scheduled Meds: . Chlorhexidine Gluconate Cloth  6 each Topical Daily  . feeding supplement (PRO-STAT SUGAR FREE 64)  30 mL Oral BID  . sodium chloride flush  10-40 mL Intracatheter Q12H   Continuous Infusions: . 0.9 % NaCl with KCl 20 mEq / L 100 mL/hr at 01/14/17 0128  . ampicillin-sulbactam (UNASYN) IV Stopped (01/14/17 0913)  . fluconazole (DIFLUCAN) IV 100 mg (01/13/17 1751)     LOS: 2 days    Time spent: 35 minutes.     Hosie Poisson, MD [facility] Pager [phone]   If 7PM-7AM, please contact night-coverage [email or web address] Password [email or web address] 01/14/2017, 1:40 PM

## 2017-01-15 LAB — BASIC METABOLIC PANEL
ANION GAP: 9 (ref 5–15)
BUN: 33 mg/dL — ABNORMAL HIGH (ref 6–20)
CALCIUM: 7.1 mg/dL — AB (ref 8.9–10.3)
CO2: 17 mmol/L — ABNORMAL LOW (ref 22–32)
Chloride: 118 mmol/L — ABNORMAL HIGH (ref 101–111)
Creatinine, Ser: 1.8 mg/dL — ABNORMAL HIGH (ref 0.44–1.00)
GFR, EST AFRICAN AMERICAN: 26 mL/min — AB (ref >60)
GFR, EST NON AFRICAN AMERICAN: 23 mL/min — AB (ref >60)
GLUCOSE: 59 mg/dL — AB (ref 65–99)
Potassium: 4.5 mmol/L (ref 3.5–5.1)
Sodium: 144 mmol/L (ref 135–145)

## 2017-01-15 LAB — IRON AND TIBC
IRON: 20 ug/dL — AB (ref 28–170)
Saturation Ratios: 10 % — ABNORMAL LOW (ref 10.4–31.8)
TIBC: 206 ug/dL — ABNORMAL LOW (ref 250–450)
UIBC: 186 ug/dL

## 2017-01-15 LAB — CBC
HCT: 24.5 % — ABNORMAL LOW (ref 36.0–46.0)
Hemoglobin: 8 g/dL — ABNORMAL LOW (ref 12.0–15.0)
MCH: 31.3 pg (ref 26.0–34.0)
MCHC: 32.7 g/dL (ref 30.0–36.0)
MCV: 95.7 fL (ref 78.0–100.0)
PLATELETS: 149 10*3/uL — AB (ref 150–400)
RBC: 2.56 MIL/uL — ABNORMAL LOW (ref 3.87–5.11)
RDW: 18.2 % — AB (ref 11.5–15.5)
WBC: 11.6 10*3/uL — AB (ref 4.0–10.5)

## 2017-01-15 LAB — FOLATE: FOLATE: 4.2 ng/mL — AB (ref >5.9)

## 2017-01-15 LAB — FERRITIN: Ferritin: 54 ng/mL (ref 11–307)

## 2017-01-15 LAB — VITAMIN B12: VITAMIN B 12: 708 pg/mL (ref 180–914)

## 2017-01-15 LAB — RETICULOCYTES
RBC.: 2.62 MIL/uL — AB (ref 3.87–5.11)
RETIC COUNT ABSOLUTE: 49.8 10*3/uL (ref 19.0–186.0)
Retic Ct Pct: 1.9 % (ref 0.4–3.1)

## 2017-01-15 MED ORDER — SODIUM CHLORIDE 0.9 % IV BOLUS (SEPSIS): 500.0000 mL | Freq: Once | INTRAVENOUS | Status: DC

## 2017-01-15 MED ORDER — SODIUM CHLORIDE 0.9 % IV BOLUS (SEPSIS)
250.0000 mL | Freq: Once | INTRAVENOUS | Status: AC
  Administered 2017-01-15: 250 mL via INTRAVENOUS

## 2017-01-15 MED ORDER — HYDROMORPHONE HCL 1 MG/ML IJ SOLN
0.5000 mg | Freq: Once | INTRAMUSCULAR | Status: AC
  Administered 2017-01-15: 0.5 mg via INTRAVENOUS
  Filled 2017-01-15: qty 1

## 2017-01-15 MED ORDER — SODIUM CHLORIDE 0.9 % IV BOLUS (SEPSIS)
500.0000 mL | Freq: Once | INTRAVENOUS | Status: AC
  Administered 2017-01-15: 500 mL via INTRAVENOUS

## 2017-01-15 MED ORDER — AMPICILLIN-SULBACTAM SODIUM 1.5 (1-0.5) G IJ SOLR
1.5000 g | INTRAMUSCULAR | Status: DC
  Administered 2017-01-15: 1.5 g via INTRAVENOUS
  Filled 2017-01-15: qty 1.5

## 2017-01-15 NOTE — Consult Note (Signed)
Palliative consult for goals of care.  See H&P for full medical history.  Goals of care discussion patients children, Christina Pittman and Christina Pittman.  They are the decision makers for Christina Pittman.  Christina Pittman has been caring for her mother in her home for the past 5 years.  She has provided exceptional care for her mother. The 3 siblings appear to have a good relationship.  Christina Pittman and Christina Pittman have already come to terms with their mothers mortality and understand that she will not survive this hospital admission.  The voiced that Christina Pittman would never want to remain in this condition and that her wishes would be to die a natural death, in peace and with dignity.  She was very vibrant despite her medical issues and actually lived alone and mowed her grass until the age of 77.  Christina Pittman is the youngest sibling and has the most difficult time discussing his mothers poor prognosis.  Subsequently we spent approx. 1.5 hours discussing all of the issues that surround EOL, including many stories and life review.    After a long discussion all 3 siblings agree that transition to full comfort beginning tomorrow is appropriate and what they agree upon for Christina Pittman.    Christina Pittman was tearful but agreeable, family surrounding bedside providing support.  Plan:  Transition to full comfort tomorrow, withdraw IVF, antibiotics, labs and continue symptom management, although patient appears in no acute distress at this time.  I believe she has entered EOL and will experience hospital death with her family her bedside.  Follow-up in AM to assist with transition.  Consult fully discussed with Dr. Domingo Cocking, PMT.  No change in orders at this time.  Will proceed with transition to full comfort as planned tomorrow.  Kizzie Fantasia, MSN, RN-BC, [facility] Palliative Care

## 2017-01-15 NOTE — Progress Notes (Signed)
PROGRESS NOTE    Christina Pittman  WUJ:[id number] DOB: 05/11/1919 DOA: 01/12/2017 PCP: Patient, No Pcp Per    Brief Narrative: Pt. with PMH of HTN, HLD, type II DM, PVD S/P right BKA, CVA on Eliquis, pituitary macroadenoma, severe aortic stenosis; admitted on 01/12/2017, presented with complaint of confusion, was found to have acute encephalopathy, community-acquired pneumonia. Currently further plan is continue IV antibiotics and IV fluids    Assessment & Plan:   Principal Problem:   Hypotension Active Problems:   Hx of type 2 diabetes mellitus   Acute renal insufficiency   UTI (urinary tract infection)   Anemia   Hypokalemia   Severe protein-calorie malnutrition (HCC)   Prolonged QT interval   Acute encephalopathy secondary to toxic, metabolic from aspiration pneumonia and possible urinary tract infection versus progression of her dementia. CT head without contrast unremarkable.  The patient's son at bedside reports she is not at baseline at this time. Patient is more lethargic today, not able to answer questions.  She appears to be moaning in pain.   Sepsis secondary to community-acquired pneumonia and urinary tract infection. Resume Unasyn to complete the course. Remained hypotensive all night and earlier this morning, she received multiple fluid boluses to keep MAP greater than 65.  Low-grade fever overnight with leukocytosis of 11,600.  Discussed with the patient's family at bedside that patient is clinically deteriorating despite antibiotics and fluids and she keep [place] repeating that she wants to go home and she does not want any kind of treatment at this time. In view of her clinical deterioration multiple multiple medical issues requested palliative care consult for goals of care.  Patient's son wants to know if he can take patient home with home hospice  History of moderate aortic stenosis: Was on Eliquis at home  History of CVA Takes Eliquis which has been held for  diffuse ecchymosis on her arms and oozing of blood from her skin.  In view of her multiple medical problems, acute encephalopathy pneumonia, urinary tract infection, acute kidney injury, clinical deterioration despite treatment with IV antibiotics recommend palliative care consult for goals of care discussion.  Hypokalemia Replaced   Acute kidney injury Baseline creatinine appears to be less than 1 patient was admitted with a creatinine of 1.52 and her creatinine continues to worsen despite fluid boluses and maintenance fluid.  Her creatinine today is 1.80 and her urine output has been minimal.  Renal ultrasound did not show any hydronephrosis.  Urine cultures show yeast.  Urine electrolytes have been ordered.  Anemia of chronic disease Baseline hemoglobin between 11-12 her hemoglobin on admission was 9.8 probably hemoconcentrated sample from dehydration.  Hemoglobin has dropped to 8.5 to 8 today.  Get anemia panel and monitor hemoglobin daily.    Severe protein energy malnutrition Dietary consult.   DVT prophylaxis: Eliquis which has been held for diffuse ecchymosis oozing of blood from her skin tears. Code Status: DNR Family Communication: Patient's son at bedside and discussed the plan with the patient's son Disposition Plan: Pending palliative care evaluation   Consultants:   Palliative care consult for goals of care   Procedures: None   Antimicrobials: Unasyn since yesterday for aspiration pneumonia   Subjective: Patient is moaning in pain lethargic and not answering questions or following commands.  Objective: Vitals:   01/15/17 1000 01/15/17 1100 01/15/17 1200 01/15/17 1300  BP: (!) 84/53  (!) 93/46   Pulse: (!) 55 (!) 106 (!) 105 99  Resp: 14 13 16  (!) 21  Temp: 99.5 F (37.5 C) 99.5 F (37.5 C) 99.7 F (37.6 C) 99.9 F (37.7 C)  TempSrc:      SpO2: 94% 96% 96% 96%  Weight:      Height:        Intake/Output Summary (Last 24 hours) at 01/15/2017  1515 Last data filed at 01/15/2017 1300 Gross per 24 hour  Intake 2315 ml  Output 510 ml  Net 1805 ml   Filed Weights   01/12/17 2052 01/13/17 0755  Weight: 57 kg (125 lb 10.6 oz) 57 kg (125 lb 10.6 oz)    Examination:  General exam: Cachectic looking lady appears in moderate discomfort Respiratory system: Diminished air entry at bases, no wheezing or rhonchi. Cardiovascular system: S1 & S2 heard, is tachycardic systolic murmur . No pedal edema. Gastrointestinal system: Abdomen is nondistended, soft and non tender bowel sounds are good Central nervous system: Confused, not following commands keeps her eyes closed. Extremities: Bilateral upper extremities have ecchymosis. Right lower extremity bandaged.  Skin: pt 's skin is fragile, ecchymotic, starting at the elbows, with oozing blood.  In tears both upper extremities and lower extremities Psychiatry: Agitated and moaning in pain.    Data Reviewed: I have personally reviewed following labs and imaging studies  CBC: Recent Labs  Lab 01/10/17 1659 01/12/17 1013 01/13/17 1131 01/14/17 0640 01/15/17 0529  WBC 11.4* 9.2 6.5 8.2 11.6*  NEUTROABS 9.1* 7.3  --  6.3  --   HGB 9.8* 9.7* 8.9* 8.5* 8.0*  HCT 29.5* 29.1* 27.5* 25.9* 24.5*  MCV 93.7 93.3 94.2 94.9 95.7  PLT 172 212 153 143* 353*   Basic Metabolic Panel: Recent Labs  Lab 01/10/17 1659 01/12/17 1013 01/13/17 1131 01/14/17 0640 01/15/17 0529  NA 141 138 139 142 144  K 3.1* 3.2* 2.9* 3.8 4.5  CL 106 102 107 113* 118*  CO2 28 26 27 22  17*  GLUCOSE 95 88 83 65 59*  BUN 25* 29* 30* 31* 33*  CREATININE 1.52* 1.47* 1.51* 1.58* 1.80*  CALCIUM 7.9* 7.7* 7.7* 7.5* 7.1*  MG  --   --  1.9 1.9  --    GFR: Estimated Creatinine Clearance: 14.8 mL/min (A) (by C-G formula based on SCr of 1.8 mg/dL (H)). Liver Function Tests: Recent Labs  Lab 01/10/17 1659 01/12/17 1013 01/13/17 1131 01/14/17 0640  AST 19 28 24 20   ALT 10* 10* 11* 11*  ALKPHOS 61 52 45 43   BILITOT 1.4* 1.2 1.6* 1.6*  PROT 5.4* 5.2* 5.1* 4.6*  ALBUMIN 2.2* 2.2* 2.4* 2.1*   No results for input(s): LIPASE, AMYLASE in the last 168 hours. No results for input(s): AMMONIA in the last 168 hours. Coagulation Profile: Recent Labs  Lab 01/12/17 1013 01/14/17 0640  INR 3.96 3.07   Cardiac Enzymes: Recent Labs  Lab 01/12/17 2255 01/13/17 0137 01/13/17 1131  TROPONINI 0.12* 0.13* 0.12*   BNP (last 3 results) No results for input(s): PROBNP in the last 8760 hours. HbA1C: No results for input(s): HGBA1C in the last 72 hours. CBG: No results for input(s): GLUCAP in the last 168 hours. Lipid Profile: No results for input(s): CHOL, HDL, LDLCALC, TRIG, CHOLHDL, LDLDIRECT in the last 72 hours. Thyroid Function Tests: No results for input(s): TSH, T4TOTAL, FREET4, T3FREE, THYROIDAB in the last 72 hours. Anemia Panel: No results for input(s): VITAMINB12, FOLATE, FERRITIN, TIBC, IRON, RETICCTPCT in the last 72 hours. Sepsis Labs: Recent Labs  Lab 01/10/17 1711 01/12/17 1026 01/14/17 0500  LATICACIDVEN 1.54 2.19* 1.3  Recent Results (from the past 240 hour(s))  Blood Culture (routine x 2)     Status: None (Preliminary result)   Collection Time: 01/12/17 10:13 AM  Result Value Ref Range Status   Specimen Description BLOOD BLOOD RIGHT ARM  Final   Special Requests   Final    BOTTLES DRAWN AEROBIC AND ANAEROBIC Blood Culture adequate volume   Culture   Final    NO GROWTH 2 DAYS Performed at [facility], [address] [place], [place] [address]    Report Status PENDING  Incomplete  Urine culture     Status: Abnormal   Collection Time: 01/12/17 12:21 PM  Result Value Ref Range Status   Specimen Description URINE, CATHETERIZED  Final   Special Requests NONE  Final   Culture 70,000 COLONIES/mL YEAST (A)  Final   Report Status 01/13/2017 FINAL  Final  Blood Culture (routine x 2)     Status: None (Preliminary result)   Collection Time: 01/12/17 10:55 PM   Result Value Ref Range Status   Specimen Description BLOOD CENTRAL LINE  Final   Special Requests IN PEDIATRIC BOTTLE Blood Culture adequate volume  Final   Culture   Final    NO GROWTH 1 DAY Performed at [facility], [place] [address], [place], [place] [address]    Report Status PENDING  Incomplete  MRSA PCR Screening     Status: None   Collection Time: 01/13/17 12:02 AM  Result Value Ref Range Status   MRSA by PCR NEGATIVE NEGATIVE Final    Comment:        The GeneXpert MRSA Assay (FDA approved for NASAL specimens only), is one component of a comprehensive MRSA colonization surveillance program. It is not intended to diagnose MRSA infection nor to guide or monitor treatment for MRSA infections.          Radiology Studies: US Renal  Result Date: 01/14/2017 CLINICAL DATA:  Acute kidney injury. EXAM: RENAL / URINARY TRACT ULTRASOUND COMPLETE COMPARISON:  None. FINDINGS: Right Kidney: Length: 8.2 cm. Echogenicity within normal limits. No mass or hydronephrosis visualized. Left Kidney: Length: 8.2 cm. Echogenicity within normal limits. No hydronephrosis visualized. 1 cm simple cyst off the mid pole noted. Bladder: Appears normal for degree of bladder distention. Other: Perihepatic ascites and a right pleural effusion are seen. IMPRESSION: Negative for hydronephrosis.  No acute abnormality. Perihepatic ascites and right pleural effusion. Electronically Signed   By: Inge Rise M.D.   On: 01/14/2017 15:34        Scheduled Meds: . Chlorhexidine Gluconate Cloth  6 each Topical Daily  . sodium chloride flush  10-40 mL Intracatheter Q12H   Continuous Infusions: . 0.9 % NaCl with KCl 20 mEq / L 100 mL/hr at 01/15/17 1300  . ampicillin-sulbactam (UNASYN) IV    . fluconazole (DIFLUCAN) IV 100 mg (01/13/17 1751)  . sodium chloride    . sodium chloride       LOS: 3 days    Time spent: 35 minutes.     Hosie Poisson, MD [facility] Pager [phone]   If  7PM-7AM, please contact night-coverage [email or web address] Password [email or web address] 01/15/2017, 3:15 PM

## 2017-01-15 NOTE — Consult Note (Signed)
[place] Nurse wound consult note Reason for Consult: Bilateral arm and left LE skin tears (numerous). Weeping, anasarca. Wound type:trauma Pressure Injury POA:NA Measurement: Right arm largest skin tear is on anterior hand, 2cm x 4cm x 0.2cm.  Left arm, largest tear measures 2cm x 4cm x 0.2cm and is bleeding a small to moderate amount, serosanguinous drainage. Left knee:  Skin tear measures 2.5cm x 5cm x 0.2cm. Several other smaller tears are noted. Wound bed:red, moist.  Left knee is with evidence of hematoma, ruddy red wound bed. Drainage (amount, consistency, odor) As described above. Generally serous to serosanguinous Periwound: Ecchymotic, edematous, fragile Dressing procedure/placement/frequency: I have given Nursing guidance via the Orders for twice daily gentle cleanse with NS, patting gently dry.  Cover all wounds with white petrolatum gauze, top with ABD (used as limb splints), secure with Kerlix roll gauze and do not use tape to secure ends, tucking them in instead.  Elevate arms and left knee on pillows. Prevalon pressure redistribution heel boots, sacral prophylactic dressing. Turning side to side as tolerated. [place] nursing team will not follow, but will remain available to this patient, the nursing and medical teams.  Please re-consult if needed. Thanks, Maudie Flakes, MSN, RN, [place], Arther Abbott  Pager# [phone]

## 2017-01-15 NOTE — Progress Notes (Signed)
Pharmacy Antibiotic Note  Christina Pittman is a 83 y.o. female admitted on 01/12/2017 with sepsis likely due to UTI and CAP.  Patient initially placed on Ceftriaxone, now transitioned to Fluconazole for UTI and Unasyn for CAP. Pharmacy has been consulted to assist with dosing.   Today, 01/15/2017: EKG 12/17 with QTc 509, daily CV Strips w/o QTc documentation Afebrile, Hypothermic WBC increased to 11.6 SCr increasing to 1.8, CrCl ~ 14.5 ml/min  Plan: Fluconazole 100mg  IV q24h (MD aware of risk of QT prolongation-will need to monitor closely). Unasyn decreased to 1.5g IV q24h Monitor renal function, cultures, clinical course.   Height: 5\' 2"  (157.5 cm) Weight: 125 lb 10.6 oz (57 kg) IBW/kg (Calculated) : 50.1  Temp (24hrs), Avg:98 F (36.7 C), Min:96.4 F (35.8 C), Max:99.1 F (37.3 C)  Recent Labs  Lab 01/10/17 1659 01/10/17 1711 01/12/17 1013 01/12/17 1026 01/13/17 1131 01/14/17 0500 01/14/17 0640 01/15/17 0529  WBC 11.4*  --  9.2  --  6.5  --  8.2 11.6*  CREATININE 1.52*  --  1.47*  --  1.51*  --  1.58* 1.80*  LATICACIDVEN  --  1.54  --  2.19*  --  1.3  --   --     Estimated Creatinine Clearance: 14.8 mL/min (A) (by C-G formula based on SCr of 1.8 mg/dL (H)).    Allergies  Allergen Reactions  . Betadine [Povidone Iodine] Hives    Because of a history of documented adverse serious drug reaction;Medi Alert bracelet  is recommended  . Equalyte Hives    Medi Alert bracelet  is recommended if verified . Drug not found in reference texts ([facility] or [facility])  . Neosporin [Neomycin-Polymyxin-Gramicidin] Hives    Medi Alert bracelet  is recommended  . Codeine Other (See Comments)    Intolerance     Antimicrobials this admission: 12/16 >> Ceftriaxone >> 12/17 12/17 >> Fluconazole >> 12/17 >> Unasyn >>  Dose adjustments this admission: 12/19 SCr increasing, Unasyn dose empirically decreased.  Microbiology results: 12/16BCx: ngtd 12/16UCx: 70K yeast   12/17MRSA PCR: negative  Thank you for allowing pharmacy to be a part of this patient's care.  Gretta Arab PharmD, BCPS Pager [phone] 01/15/2017 12:06 PM

## 2017-01-16 LAB — CBC
HCT: 26.1 % — ABNORMAL LOW (ref 36.0–46.0)
Hemoglobin: 8.3 g/dL — ABNORMAL LOW (ref 12.0–15.0)
MCH: 31.3 pg (ref 26.0–34.0)
MCHC: 31.8 g/dL (ref 30.0–36.0)
MCV: 98.5 fL (ref 78.0–100.0)
Platelets: 159 10*3/uL (ref 150–400)
RBC: 2.65 MIL/uL — ABNORMAL LOW (ref 3.87–5.11)
RDW: 18.8 % — AB (ref 11.5–15.5)
WBC: 13.6 10*3/uL — ABNORMAL HIGH (ref 4.0–10.5)

## 2017-01-16 LAB — BASIC METABOLIC PANEL
Anion gap: 7 (ref 5–15)
BUN: 39 mg/dL — AB (ref 6–20)
CALCIUM: 7.6 mg/dL — AB (ref 8.9–10.3)
CO2: 14 mmol/L — AB (ref 22–32)
CREATININE: 2.29 mg/dL — AB (ref 0.44–1.00)
Chloride: 125 mmol/L — ABNORMAL HIGH (ref 101–111)
GFR calc non Af Amer: 17 mL/min — ABNORMAL LOW (ref >60)
GFR, EST AFRICAN AMERICAN: 20 mL/min — AB (ref >60)
Glucose, Bld: 87 mg/dL (ref 65–99)
Potassium: 5.3 mmol/L — ABNORMAL HIGH (ref 3.5–5.1)
SODIUM: 146 mmol/L — AB (ref 135–145)

## 2017-01-16 LAB — CREATININE, URINE, RANDOM: CREATININE, URINE: 193.51 mg/dL

## 2017-01-16 LAB — SODIUM, URINE, RANDOM: SODIUM UR: 26 mmol/L

## 2017-01-16 MED ORDER — LORAZEPAM 2 MG/ML IJ SOLN: 0.5000 mg | Freq: Four times a day (QID) | INTRAMUSCULAR | 0 refills | Status: AC | PRN

## 2017-01-16 MED ORDER — FENTANYL CITRATE (PF) 100 MCG/2ML IJ SOLN
12.5000 ug | INTRAMUSCULAR | 0 refills | Status: DC | PRN
Start: 2017-01-16 — End: 2017-01-17

## 2017-01-16 MED ORDER — MORPHINE SULFATE (CONCENTRATE) 10 MG/0.5ML PO SOLN
5.0000 mg | ORAL | Status: DC | PRN
  Administered 2017-01-16 (×2): 5 mg via SUBLINGUAL
  Filled 2017-01-16 (×4): qty 0.5

## 2017-01-16 NOTE — Progress Notes (Signed)
Transition to full comfort today.  Referral to family choice [facility].  Hospice liaison met with family, DME ordered, plan to discharge 12/21.  Continuing family discussion today for emotional support and planning for home transition.  Christina Pittman has received pain management for dressing changes and when cleaned and turned.  No acute ongoing distress noted, resting in bed, opens eyes when touched, no verbal responses.  Discussed with family likely prognosis of less than a week once transitioned home.  Visit discussed with Dr. Domingo Cocking.  Order received to begin MS 20m sublingual every 2 hrs PRN for pain, agitation or respiratory distress.  Plan:   Discharge with hospice 12/21  Symptom management MS 578mS/L q 2 hours as needed  Will see tomorrow to assist with discharge  SuKizzie FantasiaMSN, RN-BC, [place]

## 2017-01-16 NOTE — Discharge Summary (Signed)
Physician Discharge Summary  Christina Pittman RUE:[id number] DOB: 27-Nov-1919 DOA: 01/12/2017  PCP: Patient, No Pcp Per  Admit date: 01/12/2017 Discharge date: 01/16/2017  Admitted From: Home.  Disposition:  Home with home hospice.   Recommendations for Outpatient Follow-up:   Please follow up hospice MD as needed.   Discharge Condition: Hospice.  CODE STATUS:comfort care.  Diet recommendation:comfort feeds.   Brief/Interim Summary: Pt. with PMH ofHTN, HLD, type II DM, PVD S/P right BKA, CVA on Eliquis, pituitary macroadenoma, severe aortic stenosis; admitted on12/16/2018, presented with complaint ofconfusion, was found to haveacute encephalopathy, community-acquired pneumonia.  In view of her multiple medical problems, acute encephalopathy pneumonia, urinary tract infection, acute kidney injury, clinical deterioration despite treatment with IV antibiotics recommend palliative care consult for goals of care discussion.  Patient's son wants to  take patient home with home hospice. Case manager notified and equipment ordered to be delivered home. She will be discharged home with hospice with foley catheter.       Discharge Diagnoses:  Principal Problem:   Hypotension Active Problems:   Hx of type 2 diabetes mellitus   Acute renal insufficiency   UTI (urinary tract infection)   Anemia   Hypokalemia   Severe protein-calorie malnutrition (HCC)   Prolonged QT interval  Acute encephalopathy secondary to toxic, metabolic from aspiration pneumonia and possible urinary tract infection versus progression of her dementia. CT head without contrast unremarkable.    Sepsis secondary to community-acquired pneumonia and urinary tract infection.  Remained hypotensive the last 3 days despite, multiple fluid boluses to keep MAP greater than 65.  Low-grade fever overnight with leukocytosis of 11,600.  Discussed with the patient's family at bedside that patient is clinically deteriorating  despite antibiotics and fluids and she keep [place] repeating that she wants to go home and she does not want any kind of treatment at this time. In view of her clinical deterioration multiple multiple medical issues requested palliative care consult for goals of care.  Patient's son wants to  take patient home with home hospice  History of moderate aortic stenosis: Was on Eliquis at home  History of CVA Takes Eliquis which has been held for diffuse ecchymosis on her arms and oozing of blood from her skin.  In view of her multiple medical problems, acute encephalopathy pneumonia, urinary tract infection, acute kidney injury, clinical deterioration despite treatment with IV antibiotics recommend palliative care consult for goals of care discussion.  Hypokalemia Replaced   Acute kidney injury Baseline creatinine appears to be less than 1 patient was admitted with a creatinine of 1.52 and her creatinine continues to worsen despite fluid boluses and maintenance fluid.  Her creatinine today is around 2. and her urine output has been minimal.  Renal ultrasound did not show any hydronephrosis.  Urine cultures show yeast.   Anemia of chronic disease Baseline hemoglobin between 11-12 her hemoglobin on admission was 9.8 probably hemoconcentrated sample from dehydration.  Hemoglobin has dropped to 8 today.    Severe protein energy malnutrition     Discharge Instructions  Discharge Instructions    Diet - low sodium heart healthy   Complete by:  As directed    Diet - low sodium heart healthy   Complete by:  As directed    Increase activity slowly   Complete by:  As directed    Increase activity slowly   Complete by:  As directed      Allergies as of 01/16/2017      Reactions   Betadine [  povidone Iodine] Hives   Because of a history of documented adverse serious drug reaction;Medi Alert bracelet  is recommended   Equalyte Hives   Medi Alert bracelet  is recommended if verified .  Drug not found in reference texts ([facility] or [facility])   Neosporin [neomycin-polymyxin-gramicidin] Hives   Medi Alert bracelet  is recommended   Codeine Other (See Comments)   Intolerance      Medication List    STOP taking these medications   aspirin EC 81 MG tablet   AZO-CRANBERRY PO   cyproheptadine 2 MG/5ML syrup Commonly known as:  PERIACTIN   ELIQUIS 5 MG Tabs tablet Generic drug:  apixaban   furosemide 20 MG tablet Commonly known as:  LASIX   GUAIFENESIN AC PO   levofloxacin 250 MG tablet Commonly known as:  LEVAQUIN   ondansetron 4 MG disintegrating tablet Commonly known as:  ZOFRAN ODT   potassium chloride SA 20 MEQ tablet Commonly known as:  K-DUR,KLOR-CON   simvastatin 20 MG tablet Commonly known as:  ZOCOR   triamcinolone ointment 0.1 % Commonly known as:  KENALOG     TAKE these medications   carvedilol 12.5 MG tablet Commonly known as:  COREG Take 1 tablet (12.5 mg total) by mouth 2 (two) times daily.   fentaNYL 100 MCG/2ML injection Commonly known as:  SUBLIMAZE Inject 0.25 mLs (12.5 mcg total) into the vein every 4 (four) hours as needed for severe pain.   LORazepam 2 MG/ML injection Commonly known as:  ATIVAN Inject 0.25 mLs (0.5 mg total) into the vein every 6 (six) hours as needed for anxiety.       Allergies  Allergen Reactions  . Betadine [Povidone Iodine] Hives    Because of a history of documented adverse serious drug reaction;Medi Alert bracelet  is recommended  . Equalyte Hives    Medi Alert bracelet  is recommended if verified . Drug not found in reference texts ([facility] or [facility] [facility])  . Neosporin [Neomycin-Polymyxin-Gramicidin] Hives    Medi Alert bracelet  is recommended  . Codeine Other (See Comments)    Intolerance     Consultations: Palliative care   Procedures/Studies: Dg Chest 2 View  Result Date: 01/10/2017 CLINICAL DATA:  Weakness.  Cough and congestion. EXAM: CHEST  2 VIEW COMPARISON:   07/11/2016 FINDINGS: Mild cardiac enlargement. Aortic atherosclerosis noted. Moderate bilateral pleural effusions are identified left-greater-than-right. No airspace opacities or interstitial edema. The bones appear diffusely osteopenic. IMPRESSION: 1. Cardiac enlargement, aortic atherosclerosis, and bilateral pleural effusions compatible with CHF. Electronically Signed   By: Kerby Moors M.D.   On: 01/10/2017 18:26   Ct Head Wo Contrast  Result Date: 01/12/2017 CLINICAL DATA:  Widespread ecchymosis. EXAM: CT HEAD WITHOUT CONTRAST TECHNIQUE: Contiguous axial images were obtained from the base of the skull through the vertex without intravenous contrast. COMPARISON:  07/11/2016 FINDINGS: Brain: No evidence of acute infarction, hemorrhage, hydrocephalus, extra-axial collection or mass lesion/mass effect. Stable old right MCA territory infarct, left parietal infarct, old bilateral basal ganglia lacunar infarcts and old left corona radiata lacunar infarct. Marked brain parenchymal volume loss and deep white matter microangiopathy. Stable right PE territory mass extending to the right cavernous sinus. Vascular: Advanced calcific atherosclerotic disease of the intracranial arteries. Skull: Normal. Negative for fracture or focal lesion. Sinuses/Orbits: No acute finding. Other: None. IMPRESSION: No acute intracranial abnormality. Marked parenchyma atrophy and chronic microvascular disease. Stable sequela of bilateral cerebellar infarcts. Stable pituitary mass. Electronically Signed   By: Linwood Dibbles.D.  On: 01/12/2017 13:31   US Renal  Result Date: 01/14/2017 CLINICAL DATA:  Acute kidney injury. EXAM: RENAL / URINARY TRACT ULTRASOUND COMPLETE COMPARISON:  None. FINDINGS: Right Kidney: Length: 8.2 cm. Echogenicity within normal limits. No mass or hydronephrosis visualized. Left Kidney: Length: 8.2 cm. Echogenicity within normal limits. No hydronephrosis visualized. 1 cm simple cyst off the mid pole  noted. Bladder: Appears normal for degree of bladder distention. Other: Perihepatic ascites and a right pleural effusion are seen. IMPRESSION: Negative for hydronephrosis.  No acute abnormality. Perihepatic ascites and right pleural effusion. Electronically Signed   By: Inge Rise M.D.   On: 01/14/2017 15:34   Dg Chest Port 1 View  Result Date: 01/12/2017 CLINICAL DATA:  Central catheter placement EXAM: PORTABLE CHEST 1 VIEW COMPARISON:  January 12, 2017 study obtained earlier in the day FINDINGS: Central catheter tip is in the superior vena cava. No pneumothorax. There is consolidation in the left base with small left pleural effusion. Lungs elsewhere clear. There is stable cardiomegaly with pulmonary vascularity within normal limits. There is aortic atherosclerosis. Bones are osteoporotic. IMPRESSION: Central catheter tip in superior vena cava. No pneumothorax. Persistent consolidation left base with small left pleural effusion. No new opacity evident. There is aortic atherosclerosis. Electronically Signed   By: Lowella Grip III M.D.   On: 01/12/2017 21:54   Dg Chest Port 1 View  Result Date: 01/12/2017 CLINICAL DATA:  Weakness EXAM: PORTABLE CHEST 1 VIEW COMPARISON:  01/10/2017 FINDINGS: Heart remains enlarged. Low volumes. Diffuse interstitial prominence. Blunting of both costophrenic angles is stable. Bibasilar atelectasis. IMPRESSION: Stable interstitial prominence. Cardiomegaly. Stable small pleural effusions. Electronically Signed   By: Marybelle Killings M.D.   On: 01/12/2017 11:13      Subjective: Wants to go home   Discharge Exam: Vitals:   01/16/17 0800 01/16/17 1000  BP: 92/62 (!) 86/44  Pulse: (!) 51 97  Resp: 19 18  Temp: 98.4 F (36.9 C) 98.6 F (37 C)  SpO2: 99% 99%   Vitals:   01/16/17 0600 01/16/17 0700 01/16/17 0800 01/16/17 1000  BP: 92/68  92/62 (!) 86/44  Pulse: 95 100 (!) 51 97  Resp: (!) 21 19 19 18   Temp: 98.4 F (36.9 C) 98.4 F (36.9 C) 98.4 F  (36.9 C) 98.6 F (37 C)  TempSrc:      SpO2: 94% 99% 99% 99%  Weight:      Height:        General: Pt is in mild distress from.  Cardiovascular: RRR, S1/S2 +, Respiratory: diminished air entry at bases.  Abdominal: Soft, NT, ND, bowel sounds + Extremities: ecchymotic extremities.     The results of significant diagnostics from this hospitalization (including imaging, microbiology, ancillary and laboratory) are listed below for reference.     Microbiology: Recent Results (from the past 240 hour(s))  Blood Culture (routine x 2)     Status: None (Preliminary result)   Collection Time: 01/12/17 10:13 AM  Result Value Ref Range Status   Specimen Description BLOOD BLOOD RIGHT ARM  Final   Special Requests   Final    BOTTLES DRAWN AEROBIC AND ANAEROBIC Blood Culture adequate volume   Culture   Final    NO GROWTH 4 DAYS Performed at [facility], [address] [place],  [address]    Report Status PENDING  Incomplete  Urine culture     Status: Abnormal   Collection Time: 01/12/17 12:21 PM  Result Value Ref Range Status  Specimen Description URINE, CATHETERIZED  Final   Special Requests NONE  Final   Culture 70,000 COLONIES/mL YEAST (A)  Final   Report Status 01/13/2017 FINAL  Final  Blood Culture (routine x 2)     Status: None (Preliminary result)   Collection Time: 01/12/17 10:55 PM  Result Value Ref Range Status   Specimen Description BLOOD CENTRAL LINE  Final   Special Requests IN PEDIATRIC BOTTLE Blood Culture adequate volume  Final   Culture   Final    NO GROWTH 3 DAYS Performed at [facility], [address] [place], [place] [address]    Report Status PENDING  Incomplete  MRSA PCR Screening     Status: None   Collection Time: 01/13/17 12:02 AM  Result Value Ref Range Status   MRSA by PCR NEGATIVE NEGATIVE Final    Comment:        The GeneXpert MRSA Assay (FDA approved for NASAL specimens only), is one component of a comprehensive MRSA  colonization surveillance program. It is not intended to diagnose MRSA infection nor to guide or monitor treatment for MRSA infections.      Labs: BNP (last 3 results) Recent Labs    01/10/17 1659  BNP 774.1*   Basic Metabolic Panel: Recent Labs  Lab 01/12/17 1013 01/13/17 1131 01/14/17 0640 01/15/17 0529 01/16/17 0434  NA 138 139 142 144 146*  K 3.2* 2.9* 3.8 4.5 5.3*  CL 102 107 113* 118* 125*  CO2 26 27 22  17* 14*  GLUCOSE 88 83 65 59* 87  BUN 29* 30* 31* 33* 39*  CREATININE 1.47* 1.51* 1.58* 1.80* 2.29*  CALCIUM 7.7* 7.7* 7.5* 7.1* 7.6*  MG  --  1.9 1.9  --   --    Liver Function Tests: Recent Labs  Lab 01/10/17 1659 01/12/17 1013 01/13/17 1131 01/14/17 0640  AST 19 28 24 20   ALT 10* 10* 11* 11*  ALKPHOS 61 52 45 43  BILITOT 1.4* 1.2 1.6* 1.6*  PROT 5.4* 5.2* 5.1* 4.6*  ALBUMIN 2.2* 2.2* 2.4* 2.1*   No results for input(s): LIPASE, AMYLASE in the last 168 hours. No results for input(s): AMMONIA in the last 168 hours. CBC: Recent Labs  Lab 01/10/17 1659 01/12/17 1013 01/13/17 1131 01/14/17 0640 01/15/17 0529 01/16/17 0434  WBC 11.4* 9.2 6.5 8.2 11.6* 13.6*  NEUTROABS 9.1* 7.3  --  6.3  --   --   HGB 9.8* 9.7* 8.9* 8.5* 8.0* 8.3*  HCT 29.5* 29.1* 27.5* 25.9* 24.5* 26.1*  MCV 93.7 93.3 94.2 94.9 95.7 98.5  PLT 172 212 153 143* 149* 159   Cardiac Enzymes: Recent Labs  Lab 01/12/17 2255 01/13/17 0137 01/13/17 1131  TROPONINI 0.12* 0.13* 0.12*   BNP: Invalid input(s): POCBNP CBG: No results for input(s): GLUCAP in the last 168 hours. D-Dimer No results for input(s): DDIMER in the last 72 hours. Hgb A1c No results for input(s): HGBA1C in the last 72 hours. Lipid Profile No results for input(s): CHOL, HDL, LDLCALC, TRIG, CHOLHDL, LDLDIRECT in the last 72 hours. Thyroid function studies No results for input(s): TSH, T4TOTAL, T3FREE, THYROIDAB in the last 72 hours.  Invalid input(s): FREET3 Anemia work up Recent Labs     01/15/17 0529 01/15/17 1800  VITAMINB12  --  708  FOLATE  --  4.2*  FERRITIN  --  54  TIBC  --  206*  IRON  --  20*  RETICCTPCT 1.9  --    Urinalysis    Component  Value Date/Time   COLORURINE YELLOW 01/12/2017 1221   APPEARANCEUR HAZY (A) 01/12/2017 1221   LABSPEC 1.012 01/12/2017 1221   PHURINE 5.0 01/12/2017 1221   GLUCOSEU NEGATIVE 01/12/2017 1221   HGBUR NEGATIVE 01/12/2017 1221   BILIRUBINUR NEGATIVE 01/12/2017 1221   BILIRUBINUR negative 07/08/2016 1203   KETONESUR NEGATIVE 01/12/2017 1221   PROTEINUR NEGATIVE 01/12/2017 1221   UROBILINOGEN 0.2 07/08/2016 1203   UROBILINOGEN 1.0 05/17/2011 1959   NITRITE NEGATIVE 01/12/2017 1221   LEUKOCYTESUR MODERATE (A) 01/12/2017 1221   Sepsis Labs Invalid input(s): PROCALCITONIN,  WBC,  LACTICIDVEN Microbiology Recent Results (from the past 240 hour(s))  Blood Culture (routine x 2)     Status: None (Preliminary result)   Collection Time: 01/12/17 10:13 AM  Result Value Ref Range Status   Specimen Description BLOOD BLOOD RIGHT ARM  Final   Special Requests   Final    BOTTLES DRAWN AEROBIC AND ANAEROBIC Blood Culture adequate volume   Culture   Final    NO GROWTH 4 DAYS Performed at [facility], [place] [address], [place], [place] [address]    Report Status PENDING  Incomplete  Urine culture     Status: Abnormal   Collection Time: 01/12/17 12:21 PM  Result Value Ref Range Status   Specimen Description URINE, CATHETERIZED  Final   Special Requests NONE  Final   Culture 70,000 COLONIES/mL YEAST (A)  Final   Report Status 01/13/2017 FINAL  Final  Blood Culture (routine x 2)     Status: None (Preliminary result)   Collection Time: 01/12/17 10:55 PM  Result Value Ref Range Status   Specimen Description BLOOD CENTRAL LINE  Final   Special Requests IN PEDIATRIC BOTTLE Blood Culture adequate volume  Final   Culture   Final    NO GROWTH 3 DAYS Performed at [facility], [place] [address], [place], [place] [address]     Report Status PENDING  Incomplete  MRSA PCR Screening     Status: None   Collection Time: 01/13/17 12:02 AM  Result Value Ref Range Status   MRSA by PCR NEGATIVE NEGATIVE Final    Comment:        The GeneXpert MRSA Assay (FDA approved for NASAL specimens only), is one component of a comprehensive MRSA colonization surveillance program. It is not intended to diagnose MRSA infection nor to guide or monitor treatment for MRSA infections.      Time coordinating discharge: Over 30 minutes  SIGNED:   Hosie Poisson, MD  [facility] 01/16/2017, 3:31 PM Pager   If 7PM-7AM, please contact night-coverage [email or web address] Password [email or web address]

## 2017-01-16 NOTE — Progress Notes (Signed)
Date: January 16, 2017 Chart review for discharge needs:  Family has chosen [facility] and palliative care for home hospice.  tct-Amy Amalia Hailey will come and see family.

## 2017-01-16 NOTE — Progress Notes (Addendum)
[facility]):  RN visit  Notified by Suanne Marker [facility], of family request for [facility] services at home after discharge.  Chart and patient information under review by [facility] physician.  **Update:  Patient is eligible for hospice services**  Writer spoke with sons and daughter at bedside to initiate education related to hospice philosophy, services and team approach to care.  Family verbalized understanding of information given.  Plan is for patient to discharge home tomorrow by [facility] 01/17/17.  Please send signed and completed DNR form home with patient/family.  Patient will need prescriptions for discharge comfort medications.   DME has been discussed and patient has wheelchair in the home currently.  Family requests a hospital bed and OBT only to be delivered to the home.  [facility] equipment manager has been notified and will contact [place] to arrange delivery to the home.  Home address has been verified and is correct in the chart.  [name], son, is the family member to contact to arrange time of delivery at [phone].  [facility] aware of the above.  Completed discharge summary will need to be faxed to [facility] when final.  Please notify [facility] when patient is ready to leave the unit at discharge.  Call [phone] or [phone] after 5pm.  [facility] information given to family during visit.  Above information shared with Suanne Marker, [facility].  Please call with any hospice related questions.  Thank you for this referral.  Edyth Gunnels, RN, [facility] Liaison [phone]  All hospital liaisons are now on [place].

## 2017-01-16 NOTE — Progress Notes (Signed)
[facility] Liaison:  RN  Per [facility], equipment delivery to the home set up for any time between 5pm - 9pm this evening.   Suanne Marker, [facility], aware.   Thank you,  Edyth Gunnels, RN, BSN [facility] Liaison [phone]  All hospital liaisons are now on [place].  Please refer to AMION to ensure correct liaison to contact.

## 2017-01-17 LAB — CULTURE, BLOOD (ROUTINE X 2)
CULTURE: NO GROWTH
Special Requests: ADEQUATE

## 2017-01-17 MED ORDER — MORPHINE SULFATE (CONCENTRATE) 10 MG/0.5ML PO SOLN: 5.0000 mg | ORAL | 0 refills | Status: AC | PRN

## 2017-01-17 NOTE — Progress Notes (Signed)
Patient will be transferred with CVC in place.  IV team and receiving nurse was consulted regarding the CVC prior to transfer.

## 2017-01-17 NOTE — Progress Notes (Signed)
Per [facility] Liaison, Hospice RN scheduled to be at pt home at 3pm. Medical Necessity Form placed on chart and MD paged to sign Yellow DNR. Staff RN informed that pt will need pain med script for discharge and need a bolus of pain meds prior to ambulance transport. [facility] also to be pulled prior to transport home. RN to call [facility] for transport once family has ensured that DME has arrived to home. Marney Doctor RN,BSN,NCM [phone]

## 2017-01-17 NOTE — Care Management Important Message (Signed)
Important Message  Patient Details  Name: Christina Pittman MRN: [id number] Date of Birth: 1919/05/04   Medicare Important Message Given:  Yes    Kerin Salen 01/17/2017, 11:57 AMImportant Message  Patient Details  Name: Christina Pittman MRN: [id number] Date of Birth: May 23, 1919   Medicare Important Message Given:  Yes    Kerin Salen 01/17/2017, 11:57 AM

## 2017-01-18 LAB — CULTURE, BLOOD (ROUTINE X 2)
Culture: NO GROWTH
SPECIAL REQUESTS: ADEQUATE

## 2017-01-28 DEATH — deceased

## 2017-03-03 IMAGING — MR MR HEAD W/O CM
8 of 10 series · 38 of 48 positions shown · non-contrast
Comparison: CT head 03/22/2015.  Limited MR brain 03/13/2011.

CLINICAL DATA: Acute onset of slurred speech and expressive aphasia
after waking from a nap.

EXAM:
MRI HEAD WITHOUT CONTRAST
TECHNIQUE: Multiplanar, multiecho pulse sequences of the brain and surrounding
structures were obtained without intravenous contrast.

[Series 2: FLAIR · sagittal · 5.0mm · 0.47mm/px · 3 of 24 slices shown (1 of 2)]
[im 1/24]
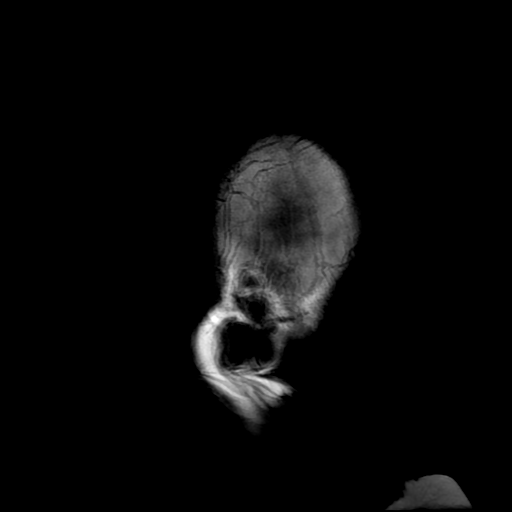
[im 12/24]
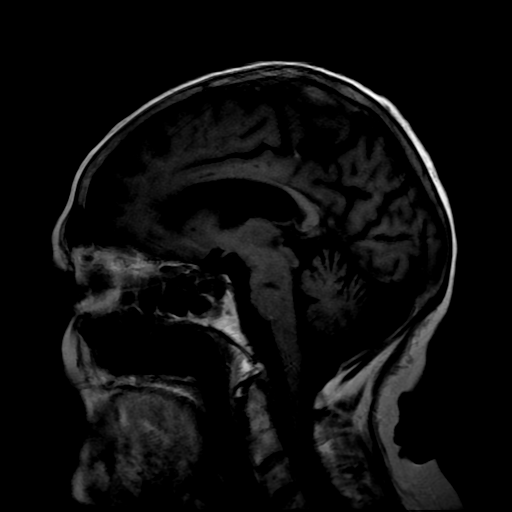
[im 24/24]
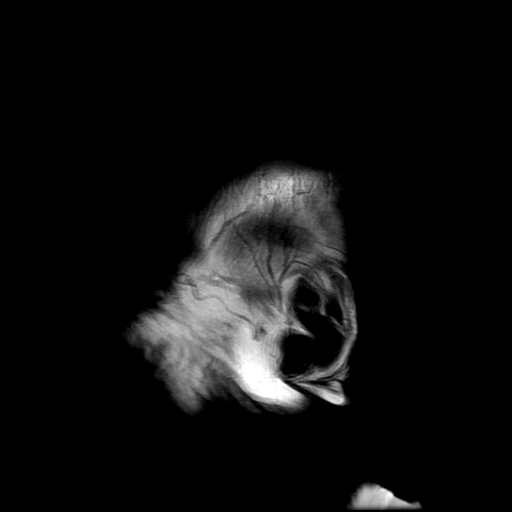

[Series 4: DWI · axial · 3.0mm · 0.94mm/px · z∈[-52,+84]mm · 9 of 94 slices shown (1 of 4)]
[im 1/94]
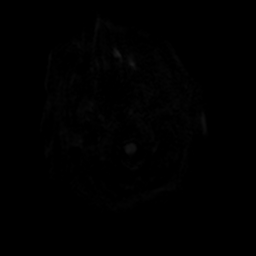
[im 17/94]
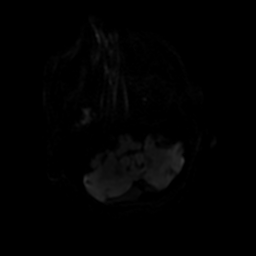
[im 26/94]
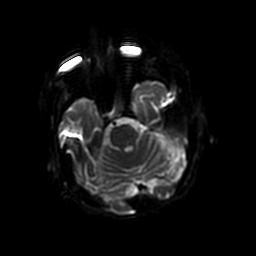
[im 43/94]
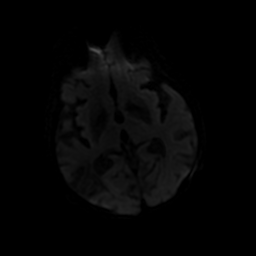
[im 51/94]
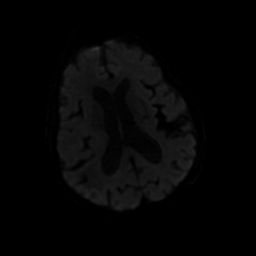
[im 68/94]
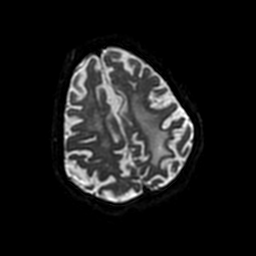
[im 77/94]
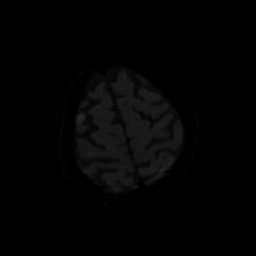
[im 85/94]
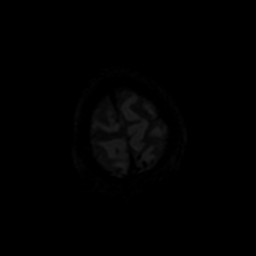
[im 94/94]
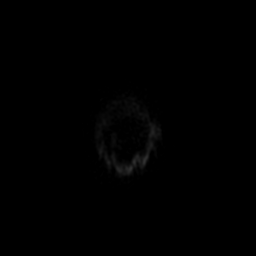

[Series 5: T2 · axial · 5.0mm · 0.47mm/px · z∈[-53,+90]mm · 3 of 25 slices shown (1 of 2)]
[im 1/25]
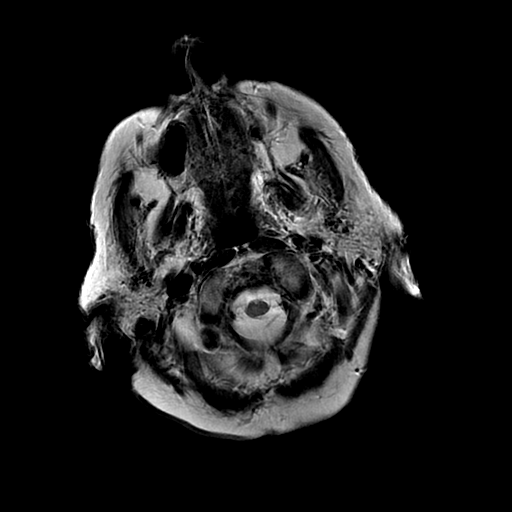
[im 13/25]
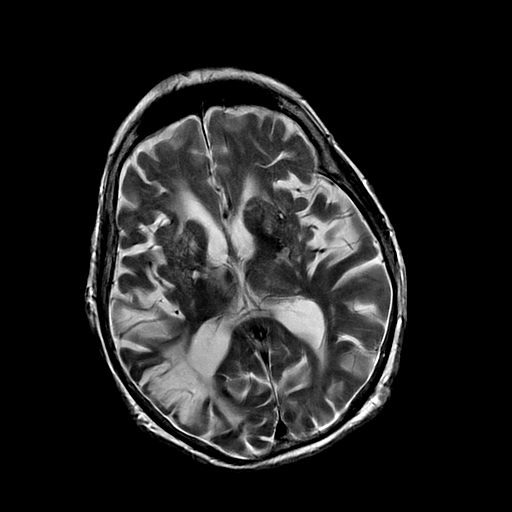
[im 25/25]
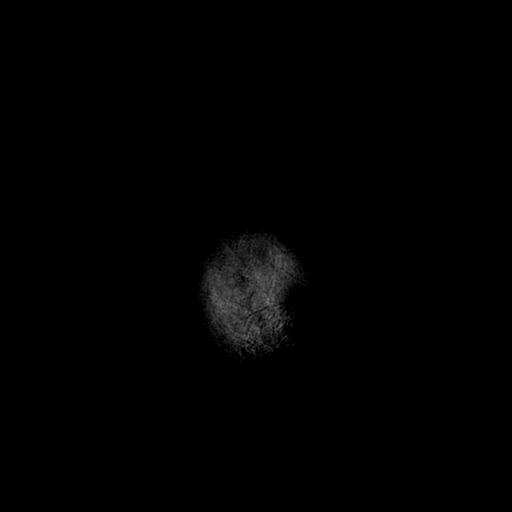

[Series 6: FLAIR · axial · 5.0mm · 0.47mm/px · z∈[-53,+90]mm · 3 of 25 slices shown (2 of 2)]
[im 1/25]
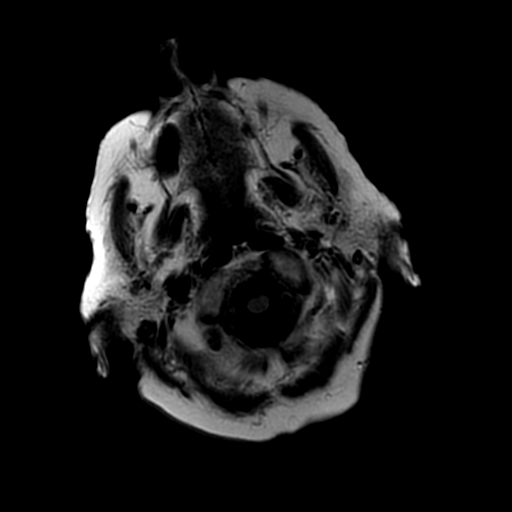
[im 13/25]
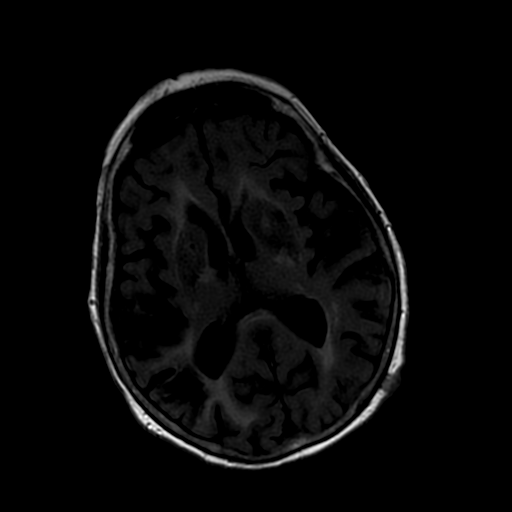
[im 25/25]
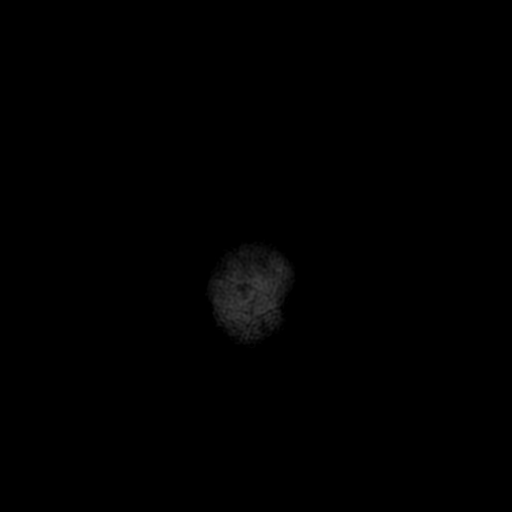

[Series 7: DWI · coronal · 4.0mm · 0.94mm/px · 8 of 62 slices shown (2 of 4)]
[im 1/62]
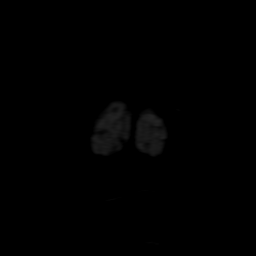
[im 9/62]
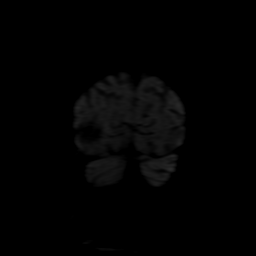
[im 18/62]
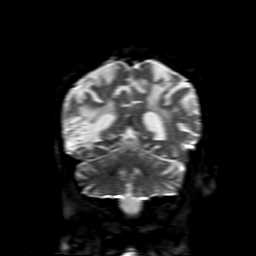
[im 27/62]
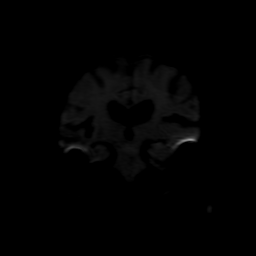
[im 35/62]
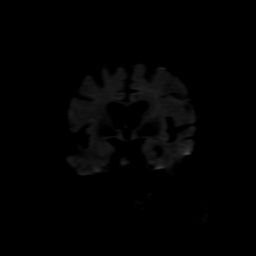
[im 44/62]
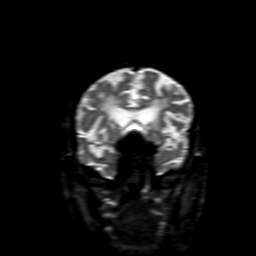
[im 53/62]
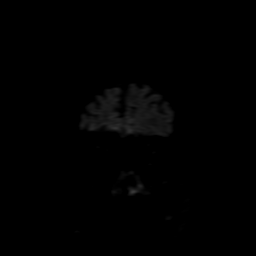
[im 62/62]
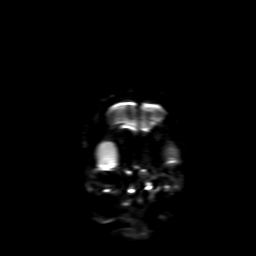

[Series 10: T2 · coronal · 5.0mm · 0.47mm/px · 2 of 26 slices shown (2 of 2)]
[im 1/26]
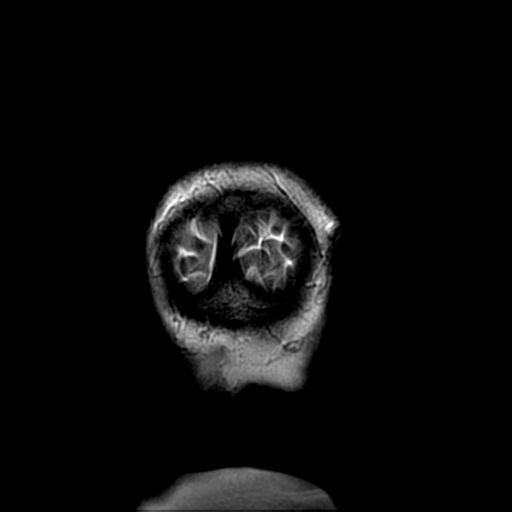
[im 13/26]
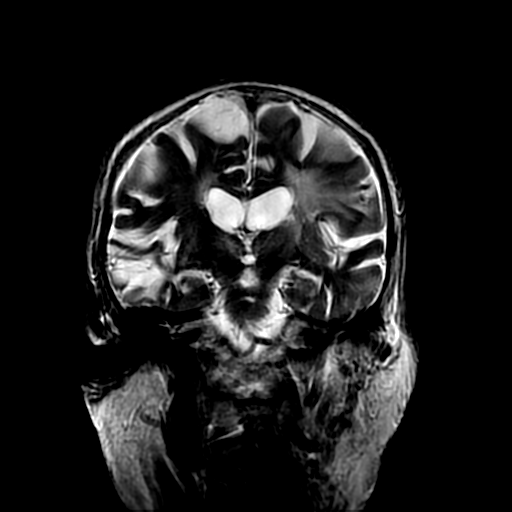

[Series 400: DWI · axial · 3.0mm · 0.94mm/px · z∈[-52,+84]mm · 6 of 47 slices shown (3 of 4)]
[im 1/47]
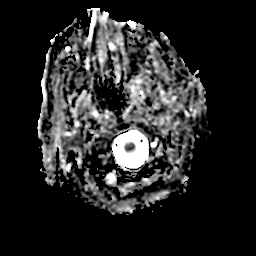
[im 10/47]
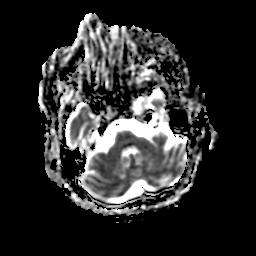
[im 19/47]
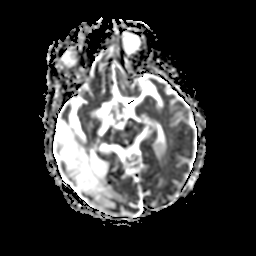
[im 28/47]
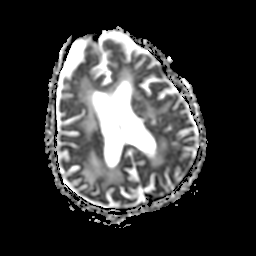
[im 37/47]
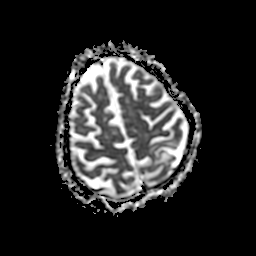
[im 47/47]
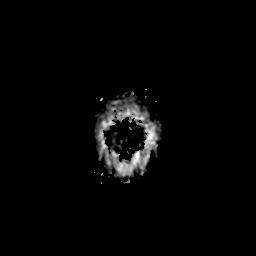

[Series 700: DWI · coronal · 4.0mm · 0.94mm/px · 4 of 30 slices shown (4 of 4)]
[im 1/30]
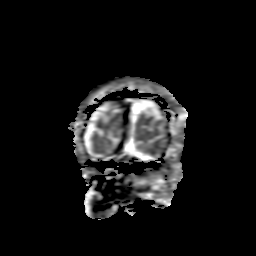
[im 10/30]
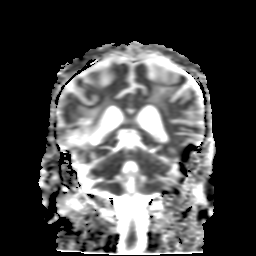
[im 20/30]
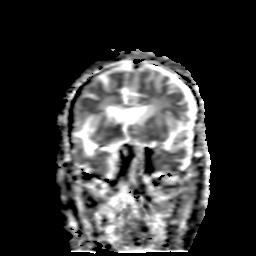
[im 30/30]
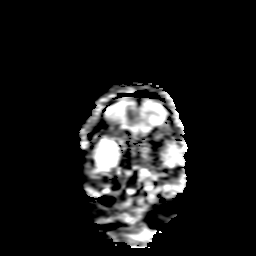

[38 of 48 positions shown; findings below may reference images not displayed]

FINDINGS: There is significant motion degradation due to claustrophobia. In
addition, no IV access could be obtained, and post infusion imaging
was not performed.

No restricted diffusion. No intra-axial mass lesion, acute
hemorrhage, or extra-axial fluid.

Advanced atrophy. Extensive chronic microvascular ischemic change.
Large remote RIGHT MCA territory infarct affects the temporal
occipital region on the RIGHT. Remote basal ganglia lacunar
infarcts, greater on the LEFT. Remote LEFT parietal cortical
infarct. Remote BILATERAL cerebellar infarcts. Flow voids are
maintained. No chronic hemorrhage.

A pituitary mass, eroding the sellar floor and extending to the
RIGHT cavernous sinus, is redemonstrated. Approximate
cross-sectional measurements are 13 x 15 x 19 mm. There is no
definite chiasmatic compression. Chromophobe adenoma is favored.
Similar appearance to priors.
IMPRESSION: No acute stroke is evident. No areas of restricted diffusion are
observed.

Advanced atrophy with extensive small vessel disease and sequelae of
chronic infarction.

Stable appearing pituitary mass, favor chromophobe adenoma.

## 2019-04-14 ENCOUNTER — Encounter: Payer: Self-pay | Admitting: Internal Medicine

## 2019-05-05 ENCOUNTER — Encounter: Payer: Self-pay | Admitting: General Practice
